# Patient Record
Sex: Male | Born: 1945 | Race: Black or African American | Hispanic: No | Marital: Married | Smoking: Former smoker
Health system: Southern US, Community
[De-identification: ages and names within clinical notes are randomized; demographics above are authoritative.]

## PROBLEM LIST (undated history)

## (undated) DIAGNOSIS — C419 Malignant neoplasm of bone and articular cartilage, unspecified: Secondary | ICD-10-CM

## (undated) DIAGNOSIS — C799 Secondary malignant neoplasm of unspecified site: Secondary | ICD-10-CM

## (undated) DIAGNOSIS — D4959 Neoplasm of unspecified behavior of other genitourinary organ: Secondary | ICD-10-CM

## (undated) DIAGNOSIS — M1990 Unspecified osteoarthritis, unspecified site: Secondary | ICD-10-CM

## (undated) DIAGNOSIS — N189 Chronic kidney disease, unspecified: Secondary | ICD-10-CM

## (undated) DIAGNOSIS — C259 Malignant neoplasm of pancreas, unspecified: Secondary | ICD-10-CM

## (undated) DIAGNOSIS — K4090 Unilateral inguinal hernia, without obstruction or gangrene, not specified as recurrent: Secondary | ICD-10-CM

## (undated) DIAGNOSIS — C61 Malignant neoplasm of prostate: Secondary | ICD-10-CM

## (undated) DIAGNOSIS — Z87442 Personal history of urinary calculi: Secondary | ICD-10-CM

## (undated) DIAGNOSIS — H269 Unspecified cataract: Secondary | ICD-10-CM

## (undated) HISTORY — DX: Unspecified cataract: H26.9

## (undated) HISTORY — PX: PROSTATECTOMY: SHX69

## (undated) HISTORY — DX: Malignant neoplasm of prostate: C61

## (undated) HISTORY — DX: Chronic kidney disease, unspecified: N18.9

## (undated) HISTORY — PX: TONSILLECTOMY: SUR1361

## (undated) HISTORY — PX: INGUINAL HERNIA REPAIR: SUR1180

## (undated) HISTORY — PX: EYE SURGERY: SHX253

## (undated) HISTORY — PX: CATARACT EXTRACTION: SUR2

---

## 1988-10-29 DIAGNOSIS — C61 Malignant neoplasm of prostate: Secondary | ICD-10-CM

## 1988-10-29 HISTORY — DX: Malignant neoplasm of prostate: C61

## 2003-05-13 ENCOUNTER — Ambulatory Visit (HOSPITAL_COMMUNITY): Admission: RE | Admit: 2003-05-13 | Discharge: 2003-05-13 | Payer: Self-pay | Admitting: *Deleted

## 2003-05-13 ENCOUNTER — Encounter: Payer: Self-pay | Admitting: General Surgery

## 2003-06-10 ENCOUNTER — Ambulatory Visit (HOSPITAL_COMMUNITY): Admission: RE | Admit: 2003-06-10 | Discharge: 2003-06-10 | Payer: Self-pay | Admitting: *Deleted

## 2003-06-10 ENCOUNTER — Encounter: Payer: Self-pay | Admitting: General Surgery

## 2003-10-11 ENCOUNTER — Ambulatory Visit (HOSPITAL_COMMUNITY): Admission: RE | Admit: 2003-10-11 | Discharge: 2003-10-11 | Payer: Self-pay | Admitting: *Deleted

## 2003-12-14 ENCOUNTER — Encounter: Admission: RE | Admit: 2003-12-14 | Discharge: 2003-12-14 | Payer: Self-pay | Admitting: Family Medicine

## 2003-12-30 ENCOUNTER — Encounter: Admission: RE | Admit: 2003-12-30 | Discharge: 2003-12-30 | Payer: Self-pay | Admitting: Family Medicine

## 2004-01-07 ENCOUNTER — Encounter: Payer: Self-pay | Admitting: Internal Medicine

## 2004-01-07 HISTORY — PX: COLONOSCOPY: SHX174

## 2004-06-20 ENCOUNTER — Ambulatory Visit (HOSPITAL_COMMUNITY): Admission: RE | Admit: 2004-06-20 | Discharge: 2004-06-20 | Payer: Self-pay | Admitting: *Deleted

## 2004-09-19 ENCOUNTER — Ambulatory Visit (HOSPITAL_COMMUNITY): Admission: RE | Admit: 2004-09-19 | Discharge: 2004-09-19 | Payer: Self-pay | Admitting: Family Medicine

## 2004-09-19 ENCOUNTER — Ambulatory Visit: Payer: Self-pay | Admitting: Family Medicine

## 2004-10-10 ENCOUNTER — Ambulatory Visit: Payer: Self-pay | Admitting: Sports Medicine

## 2004-10-10 ENCOUNTER — Ambulatory Visit (HOSPITAL_COMMUNITY): Admission: RE | Admit: 2004-10-10 | Discharge: 2004-10-10 | Payer: Self-pay | Admitting: Sports Medicine

## 2004-11-02 ENCOUNTER — Ambulatory Visit: Payer: Self-pay | Admitting: Family Medicine

## 2006-06-24 ENCOUNTER — Ambulatory Visit: Payer: Self-pay | Admitting: Family Medicine

## 2006-06-27 ENCOUNTER — Ambulatory Visit: Payer: Self-pay | Admitting: Family Medicine

## 2006-07-25 ENCOUNTER — Ambulatory Visit: Payer: Self-pay | Admitting: Family Medicine

## 2006-12-26 DIAGNOSIS — C61 Malignant neoplasm of prostate: Secondary | ICD-10-CM

## 2007-05-15 ENCOUNTER — Telehealth: Payer: Self-pay | Admitting: *Deleted

## 2007-05-15 ENCOUNTER — Ambulatory Visit: Payer: Self-pay | Admitting: Family Medicine

## 2008-01-13 ENCOUNTER — Telehealth: Payer: Self-pay | Admitting: *Deleted

## 2008-01-15 ENCOUNTER — Encounter: Payer: Self-pay | Admitting: Family Medicine

## 2008-01-15 ENCOUNTER — Ambulatory Visit: Payer: Self-pay | Admitting: Family Medicine

## 2008-01-15 LAB — CONVERTED CEMR LAB
Calcium: 9 mg/dL (ref 8.4–10.5)
Cholesterol: 204 mg/dL — ABNORMAL HIGH (ref 0–200)
Glucose, Bld: 101 mg/dL — ABNORMAL HIGH (ref 70–99)
HDL: 73 mg/dL (ref >39)
Sodium: 148 meq/L — ABNORMAL HIGH (ref 135–145)
Total CHOL/HDL Ratio: 2.8
Triglycerides: 57 mg/dL (ref <150)

## 2008-01-16 ENCOUNTER — Encounter: Payer: Self-pay | Admitting: Family Medicine

## 2008-01-26 ENCOUNTER — Ambulatory Visit (HOSPITAL_COMMUNITY): Admission: RE | Admit: 2008-01-26 | Discharge: 2008-01-26 | Payer: Self-pay | Admitting: Ophthalmology

## 2008-02-05 ENCOUNTER — Ambulatory Visit: Payer: Self-pay | Admitting: Family Medicine

## 2008-12-16 ENCOUNTER — Ambulatory Visit (HOSPITAL_COMMUNITY): Admission: RE | Admit: 2008-12-16 | Discharge: 2008-12-16 | Payer: Self-pay | Admitting: Urology

## 2008-12-22 ENCOUNTER — Emergency Department (HOSPITAL_COMMUNITY): Admission: EM | Admit: 2008-12-22 | Discharge: 2008-12-22 | Payer: Self-pay | Admitting: Emergency Medicine

## 2008-12-22 ENCOUNTER — Telehealth: Payer: Self-pay | Admitting: Family Medicine

## 2008-12-28 ENCOUNTER — Telehealth: Payer: Self-pay | Admitting: Internal Medicine

## 2008-12-29 ENCOUNTER — Ambulatory Visit: Payer: Self-pay | Admitting: Gastroenterology

## 2009-02-22 ENCOUNTER — Ambulatory Visit: Payer: Self-pay | Admitting: Internal Medicine

## 2009-02-22 DIAGNOSIS — K219 Gastro-esophageal reflux disease without esophagitis: Secondary | ICD-10-CM | POA: Insufficient documentation

## 2009-02-22 HISTORY — DX: Gastro-esophageal reflux disease without esophagitis: K21.9

## 2009-03-24 ENCOUNTER — Ambulatory Visit: Payer: Self-pay | Admitting: Family Medicine

## 2009-03-24 DIAGNOSIS — M949 Disorder of cartilage, unspecified: Secondary | ICD-10-CM

## 2009-03-24 DIAGNOSIS — M899 Disorder of bone, unspecified: Secondary | ICD-10-CM | POA: Insufficient documentation

## 2010-02-02 ENCOUNTER — Ambulatory Visit: Payer: Self-pay | Admitting: Family Medicine

## 2010-02-02 LAB — CONVERTED CEMR LAB
BUN: 13 mg/dL (ref 6–23)
CO2: 27 meq/L (ref 19–32)
Cholesterol: 189 mg/dL (ref 0–200)
Glucose, Bld: 97 mg/dL (ref 70–99)
Sodium: 143 meq/L (ref 135–145)
Total Bilirubin: 0.7 mg/dL (ref 0.3–1.2)
Total Protein: 6.3 g/dL (ref 6.0–8.3)
Triglycerides: 50 mg/dL (ref <150)
VLDL: 10 mg/dL (ref 0–40)

## 2010-02-03 ENCOUNTER — Encounter: Payer: Self-pay | Admitting: Family Medicine

## 2010-05-24 ENCOUNTER — Encounter: Payer: Self-pay | Admitting: Family Medicine

## 2010-08-25 ENCOUNTER — Encounter: Payer: Self-pay | Admitting: Family Medicine

## 2010-11-20 ENCOUNTER — Encounter (INDEPENDENT_AMBULATORY_CARE_PROVIDER_SITE_OTHER): Payer: Self-pay | Admitting: *Deleted

## 2010-11-28 NOTE — Consult Note (Signed)
Summary: [facility]   Imported By: Clydell Hakim 06/05/2010 16:46:57  _____________________________________________________________________  External Attachment:    Type:   Image     Comment:   External Document

## 2010-11-28 NOTE — Assessment & Plan Note (Signed)
Summary: CPE,df   Vital Signs:  Patient profile:   65 year old male Height:      69.75 inches Weight:      246.9 pounds BMI:     35.81 Temp:     98.0 degrees F oral Pulse rate:   67 / minute BP sitting:   123 / 70  (left arm) Cuff size:   regular  Vitals Entered By: Garen Grams LPN (February 03, 2955 8:41 AM) CC: cpe Is Patient Diabetic? No Pain Assessment Patient in pain? no        Primary Care Provider:  Mateo Flow  CC:  cpe.  History of Present Illness: Feels well no complaints other than a swelling under his R jaw that comes and goes for the last few months.  never expresses any material is not sore does not have any other swellings.  ROS - as above PMH - Medications reviewed and updated in medication list.  Smoking Status noted in VS form    Habits & Providers  Alcohol-Tobacco-Diet     Tobacco Status: never  Current Medications (verified): 1)  Bayer Aspirin 325 Mg Tabs (Aspirin) .... Take 1 Tablet By Mouth Once A Day 2)  Centrum Silver  Tabs (Multiple Vitamins-Minerals) .... Once Daily 3)  Vitamin C Cr 500 Mg Cr-Caps (Ascorbic Acid) .... Once Daily 4)  Vitamin D 1000 Unit Caps (Cholecalciferol) .Marland Kitchen.. 1 Daily 5)  B-100 Complex  Tabs (Vitamins-Lipotropics) .... Once Daily 6)  Trelstar La Mixject 11.25 Mg Susr (Triptorelin Pamoate) .... Take Injections Quarterly 7)  Omeprazole 20 Mg Tbec (Omeprazole) .... Take 1 Tab 30 Min Prior To Breakfast. 8)  Garlic 1000 Mg Caps (Garlic) .Marland Kitchen.. 1 Cap By Mouth Once Daily 9)  Tums Ultra 1000 1000 Mg Chew (Calcium Carbonate Antacid) .Marland Kitchen.. 1 By Mouth Three Times A Day  Allergies: No Known Drug Allergies  Social History: Reviewed history from 02/22/2009 and no changes required. 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  Likes to garden and fish; One Dtr in [place] in 2004 Grandchild due in 2011 in [place] Patient is a former smoker. quit 1983 Illicit Drug Use - no Daily Caffeine  Use some coffee Alcohol Use - no  Review of Systems  The patient denies anorexia, fever, weight loss, vision loss, decreased hearing, hoarseness, chest pain, syncope, dyspnea on exertion, peripheral edema, prolonged cough, headaches, hemoptysis, abdominal pain, melena, hematochezia, severe indigestion/heartburn, hematuria, incontinence, genital sores, muscle weakness, suspicious skin lesions, transient blindness, difficulty walking, depression, unusual weight change, abnormal bleeding, and angioedema.    Physical Exam  General:  Well-developed,well-nourished,in no acute distress; alert,appropriate and cooperative throughout examination. Over weight Head:  Normocephalic and atraumatic without obvious abnormalities. No apparent alopecia or balding. Ears:  External ear exam shows no significant lesions or deformities.  Otoscopic examination reveals clear canals, tympanic membranes are intact bilaterally without bulging, retraction, inflammation or discharge. Hearing is grossly normal bilaterally. Nose:  External nasal examination shows no deformity or inflammation. Nasal mucosa are pink and moist without lesions or exudates. Mouth:  Oral mucosa and oropharynx without lesions or exudates.  Teeth in good repair. Neck:  No deformities, masses, or tenderness noted.  A small lymph node under R jaw angle approximately 5 mm smooth mobile nontender  Lungs:  Normal respiratory effort, chest expands symmetrically. Lungs are clear to auscultation, no crackles or wheezes. Heart:  Normal rate and regular rhythm. S1 and S2 normal without gallop, murmur, click, rub or  other extra sounds. Abdomen:  Bowel sounds positive,abdomen soft and non-tender without masses, organomegaly or hernias noted. Msk:  No deformity or scoliosis noted of thoracic or lumbar spine.   Extremities:  No clubbing, cyanosis, edema, or deformity noted with normal full range of motion of all joints.   Skin:  No suspicious lesions Cervical  Nodes:  No lymphadenopathy noted except under R jaw   Impression & Recommendations:  Problem # 1:  ROUTINE GENERAL MEDICAL EXAM, NON PEDIATRIC (ICD-V70.0) Healthy except for weight.  Next winter he will dust off the exercise machines in the basement! Orders: [facility] - Est  40-64 yrs ([address])  Problem # 2:  PROSTATE CANCER (ICD-185) PSA followed by Dr Wilson Singer.  On intermittent hormone injections depending on PSA level.  Bone Scan last year was normal.  Will check CMET looking for liver or metabolic changes  Orders: Comp Met-[facility] ([id number])  Problem # 3:  GERD (ICD-530.81) no symptoms.  Uses omeprazole rarely  Orders: Lipid-[facility] ([id number])  His updated medication list for this problem includes:    Omeprazole 20 Mg Tbec (Omeprazole) .Marland Kitchen... Take 1 tab 30 min prior to breakfast.    Tums Ultra 1000 1000 Mg Chew (Calcium carbonate antacid) .Marland Kitchen... 1 by mouth three times a day  Complete Medication List: 1)  Bayer Aspirin 325 Mg Tabs (Aspirin) .... Take 1 tablet by mouth once a day 2)  Centrum Silver Tabs (Multiple vitamins-minerals) .... Once daily 3)  Vitamin C Cr 500 Mg Cr-caps (Ascorbic acid) .... Once daily 4)  Vitamin D 1000 Unit Caps (Cholecalciferol) .Marland Kitchen.. 1 daily 5)  B-100 Complex Tabs (Vitamins-lipotropics) .... Once daily 6)  Trelstar La Mixject 11.25 Mg Susr (Triptorelin pamoate) .... Take injections quarterly 7)  Omeprazole 20 Mg Tbec (Omeprazole) .... Take 1 tab 30 min prior to breakfast. 8)  Garlic 1000 Mg Caps (Garlic) .Marland Kitchen.. 1 cap by mouth once daily 9)  Tums Ultra 1000 1000 Mg Chew (Calcium carbonate antacid) .Marland Kitchen.. 1 by mouth three times a day  Patient Instructions: 1)  Please schedule a follow-up appointment in 1 year.  2)  I will call you if your lab is abnormal otherwise I will send you a letter within 2 weeks. 3)  Your major health issue is your weight.  Really work hard to decrease your calorie intake and keep up steady exercise  Prevention & Chronic Care Immunizations    Influenza vaccine: Not documented    Tetanus booster: 12/04/2003: Done.   Tetanus booster due: 12/03/2013    Pneumococcal vaccine: Not documented    H. zoster vaccine: 01/15/2008: given  Colorectal Screening   Hemoccult: Done.  (02/01/2004)   Hemoccult due: Not Indicated    Colonoscopy: Done.  (01/28/2004)   Colonoscopy due: 01/27/2014  Other Screening   PSA: Dr Annabell Howells for prostate cancer  (10/30/2007)   PSA due due: Not Indicated   Smoking status: never  (02/02/2010)  Lipids   Total Cholesterol: 204  (01/15/2008)   LDL: 120  (01/15/2008)   LDL Direct: Not documented   HDL: 73  (01/15/2008)   Triglycerides: 57  (01/15/2008)

## 2010-11-28 NOTE — Letter (Signed)
Summary: Generic Letter  [facility]  [address]   [place], [place] [address]   Phone: [phone]  Fax: [phone]    02/03/2010  Jared [name] [address] [place], [place]  [address]  Dear [name],  Your labs are great!   Keeping working on exercise and weight  control.  [name] Note: All result statuses are Final unless otherwise noted.  Tests: (1) Comprehensive Metabolic Panel (57846)   Order Note: FASTING   Sodium                    143 mEq/L                   135-145   Potassium                 4.0 mEq/L                   3.5-5.3   Chloride                  106 mEq/L                   96-112   CO2                       27 mEq/L                    19-32   Glucose                   97 mg/dL                    96-29   BUN                       13 mg/dL                    5-28   Creatinine                0.94 mg/dL                  0.40-1.50   Bilirubin, Total          0.7 mg/dL                   4.1-3.2   Alkaline Phosphatase      48 U/L                      39-117   AST/SGOT                  29 U/L                      0-37   ALT/SGPT                  23 U/L                      0-53   Total Protein             6.3 g/dL                    4.4-0.1   Albumin                   4.4 g/dL  3.5-5.2   Calcium                   9.1 mg/dL                   3.6-64.4  Tests: (2) Lipid Profile (03474)   Cholesterol               189 mg/dL                   2-595     ATP III Classification:           < 200        mg/dL        Desirable          200 - 239     mg/dL        Borderline High          >= 240        mg/dL        High         Triglyceride              50 mg/dL                    <638   HDL Cholesterol           75 mg/dL                    >75   Total Chol/HDL Ratio      2.5 Ratio  VLDL Cholesterol (Calc)                             10 mg/dL                    6-43  LDL Cholesterol (Calc)                          104  mg/dL                   3-29      Sincerely,   Pearlean Brownie MD  Appended Document: Generic Letter mailed.

## 2010-11-28 NOTE — Consult Note (Signed)
Summary: [facility]   Imported By: De Nurse 09/07/2010 14:00:17  _____________________________________________________________________  External Attachment:    Type:   Image     Comment:   External Document

## 2010-11-29 ENCOUNTER — Encounter: Payer: Self-pay | Admitting: Family Medicine

## 2010-11-30 ENCOUNTER — Other Ambulatory Visit: Payer: Self-pay | Admitting: Urology

## 2010-11-30 DIAGNOSIS — C61 Malignant neoplasm of prostate: Secondary | ICD-10-CM

## 2010-11-30 NOTE — Letter (Signed)
Summary: Generic Letter  [facility]  [address]   [place], [place] [address]   Phone: [phone]  Fax: [phone]     11/20/2010  [address] [place], [place]  [address]  Dear Mr. Yarde,  We are happy to let you know that since you are covered under Medicare you are able to have a FREE Welcome to Medicare visit at the [facility] to discuss your HEALTH. There will be no co-payment.  At this visit you will meet with your doctor and Arlys John an expert in wellness and the health coach at our clinic.  At this visit we will discuss ways to keep you healthy and feeling well.  This visit will not replace your regular doctor visit and we cannot refill medications.     You will need to plan to be here at least one hour to talk about your medical history, your current status, review all of your medications, and discuss your future plans for your health.  This information will be entered into your record for your doctor to have and review.  If you are interested in staying healthy, this type of visit can help.  Please call the office at: [phone], to schedule a "Medicare Wellness Visit".  The day of the visit you should bring in all of your medications, including any vitamins, herbs, over the counter products you take.  Make a list of all the other doctors that you see, so we know who they are. If you have any other health documents please bring them.  We look forward to helping you stay healthy.  Sincerely,   [facility]  IP[facility]

## 2010-12-06 NOTE — Consult Note (Signed)
Summary: [facility]   Imported By: De Nurse 12/01/2010 13:43:56  _____________________________________________________________________  External Attachment:    Type:   Image     Comment:   External Document

## 2010-12-12 ENCOUNTER — Encounter (HOSPITAL_COMMUNITY): Payer: Self-pay

## 2010-12-12 ENCOUNTER — Ambulatory Visit (HOSPITAL_COMMUNITY)
Admission: RE | Admit: 2010-12-12 | Discharge: 2010-12-12 | Disposition: A | Discharge status: Home / Self Care | Payer: Medicare Other | Source: Ambulatory Visit | Attending: Urology | Admitting: Urology

## 2010-12-12 ENCOUNTER — Other Ambulatory Visit: Payer: Self-pay | Admitting: Urology

## 2010-12-12 ENCOUNTER — Encounter (HOSPITAL_COMMUNITY)
Admission: RE | Admit: 2010-12-12 | Discharge: 2010-12-12 | Disposition: A | Discharge status: Home / Self Care | Payer: Medicare Other | Source: Ambulatory Visit | Attending: Urology | Admitting: Urology

## 2010-12-12 DIAGNOSIS — M47817 Spondylosis without myelopathy or radiculopathy, lumbosacral region: Secondary | ICD-10-CM | POA: Insufficient documentation

## 2010-12-12 DIAGNOSIS — Z8546 Personal history of malignant neoplasm of prostate: Secondary | ICD-10-CM | POA: Insufficient documentation

## 2010-12-12 DIAGNOSIS — R972 Elevated prostate specific antigen [PSA]: Secondary | ICD-10-CM | POA: Insufficient documentation

## 2010-12-12 DIAGNOSIS — J3489 Other specified disorders of nose and nasal sinuses: Secondary | ICD-10-CM | POA: Insufficient documentation

## 2010-12-12 DIAGNOSIS — N289 Disorder of kidney and ureter, unspecified: Secondary | ICD-10-CM | POA: Insufficient documentation

## 2010-12-12 DIAGNOSIS — C61 Malignant neoplasm of prostate: Secondary | ICD-10-CM

## 2010-12-12 DIAGNOSIS — R937 Abnormal findings on diagnostic imaging of other parts of musculoskeletal system: Secondary | ICD-10-CM | POA: Insufficient documentation

## 2010-12-12 MED ORDER — TECHNETIUM TC 99M MEDRONATE IV KIT
23.4000 | PACK | Freq: Once | INTRAVENOUS | Status: AC | PRN
  Administered 2010-12-12: 23 via INTRAVENOUS

## 2011-02-13 ENCOUNTER — Other Ambulatory Visit: Payer: Self-pay | Admitting: Family Medicine

## 2011-02-13 ENCOUNTER — Encounter: Payer: Managed Care, Other (non HMO) | Admitting: Home Health Services

## 2011-02-13 ENCOUNTER — Other Ambulatory Visit: Payer: Medicare Other

## 2011-02-13 DIAGNOSIS — C61 Malignant neoplasm of prostate: Secondary | ICD-10-CM

## 2011-02-13 DIAGNOSIS — Z13220 Encounter for screening for lipoid disorders: Secondary | ICD-10-CM

## 2011-02-13 LAB — DIFFERENTIAL
Lymphocytes Relative: 25 % (ref 12–46)
Lymphs Abs: 1.1 10*3/uL (ref 0.7–4.0)
Monocytes Absolute: 0.4 10*3/uL (ref 0.1–1.0)
Monocytes Relative: 10 % (ref 3–12)
Neutro Abs: 2.8 10*3/uL (ref 1.7–7.7)

## 2011-02-13 LAB — CBC
Hemoglobin: 15.4 g/dL (ref 13.0–17.0)
RBC: 5.17 MIL/uL (ref 4.22–5.81)

## 2011-02-13 LAB — POCT I-STAT, CHEM 8
BUN: 4 mg/dL — ABNORMAL LOW (ref 6–23)
Creatinine, Ser: 1.1 mg/dL (ref 0.4–1.5)
Hemoglobin: 16.3 g/dL (ref 13.0–17.0)
Potassium: 3.7 mEq/L (ref 3.5–5.1)
Sodium: 142 mEq/L (ref 135–145)

## 2011-02-13 LAB — COMPREHENSIVE METABOLIC PANEL
ALT: 19 U/L (ref 0–53)
Calcium: 9.2 mg/dL (ref 8.4–10.5)
GFR calc Af Amer: >60 mL/min (ref >60)
Glucose, Bld: 88 mg/dL (ref 70–99)
Sodium: 141 mEq/L (ref 135–145)
Total Protein: 6.1 g/dL (ref 6.0–8.3)

## 2011-02-13 LAB — POCT CARDIAC MARKERS
CKMB, poc: <1 ng/mL — ABNORMAL LOW (ref 1.0–8.0)
CKMB, poc: <1 ng/mL — ABNORMAL LOW (ref 1.0–8.0)
Myoglobin, poc: 56.1 ng/mL (ref 12–200)
Myoglobin, poc: 59.7 ng/mL (ref 12–200)
Troponin i, poc: <0.05 ng/mL (ref 0.00–0.09)

## 2011-02-13 LAB — BASIC METABOLIC PANEL
BUN: 15 mg/dL (ref 6–23)
Chloride: 105 mEq/L (ref 96–112)
Glucose, Bld: 120 mg/dL — ABNORMAL HIGH (ref 70–99)
Potassium: 4.5 mEq/L (ref 3.5–5.3)

## 2011-02-13 LAB — LIPID PANEL
Cholesterol: 196 mg/dL (ref 0–200)
LDL Cholesterol: 106 mg/dL — ABNORMAL HIGH (ref 0–99)
VLDL: 13 mg/dL (ref 0–40)

## 2011-02-13 LAB — LIPASE, BLOOD: Lipase: 46 U/L (ref 11–59)

## 2011-02-13 NOTE — Progress Notes (Signed)
Bmp and flp done today Jared [name] 

## 2011-02-21 ENCOUNTER — Ambulatory Visit (INDEPENDENT_AMBULATORY_CARE_PROVIDER_SITE_OTHER): Payer: Medicare Other | Admitting: Family Medicine

## 2011-02-21 ENCOUNTER — Encounter: Payer: Self-pay | Admitting: Family Medicine

## 2011-02-21 VITALS — BP 126/88 | HR 97 | Temp 97.7°F | Wt 237.5 lb

## 2011-02-21 DIAGNOSIS — M1990 Unspecified osteoarthritis, unspecified site: Secondary | ICD-10-CM

## 2011-02-21 DIAGNOSIS — Z23 Encounter for immunization: Secondary | ICD-10-CM

## 2011-02-21 MED ORDER — IBUPROFEN 800 MG PO TABS: 800.0000 mg | ORAL_TABLET | Freq: Three times a day (TID) | ORAL | Status: DC | PRN

## 2011-02-21 NOTE — Assessment & Plan Note (Signed)
This is likely causing his R hip pain although does have some symptoms of sciatica.  Will treat with prn nsaids.  If worsen consider gabapentin for sciatica.  Recent xrays and bone scan were normal

## 2011-02-21 NOTE — Progress Notes (Signed)
  Subjective:    Patient ID: Jared [name], male    DOB: [birth year], 65 y.o.   MRN: [id number]  HPI  R Hip Thigh Pain Location: posterior hip radiates down to mid  thigh.   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  Sweets are his main problem   Review of Symptoms - see HPI  PMH - Smoking status noted.   Recent xrays and bone scan reviewed were done as part of cancer surveilance      Review of Systems     Objective:   Physical Exam Neck:  No deformities, thyromegaly, masses, or tenderness noted.   Supple with full range of motion without pain. Heart - Regular rate and rhythm.  No murmurs, gallops or rubs.    Lungs:  Normal respiratory effort, chest expands symmetrically. Lungs are clear to auscultation, no crackles or wheezes. ,Abdomen: soft and non-tender without masses, organomegaly or hernias noted.  No guarding or rebound Extremities:  No cyanosis, edema, or deformity noted with good range of motion of all major joints.  Pain with extremes of R hip motion mild SLR pain at 90 degrees.  Knees no pain or deformity good ROM        Assessment & Plan:

## 2011-02-21 NOTE — Patient Instructions (Signed)
Use ibuprofen as needed for the hip pain if not helping then call us  Call us if your blood pressure is regularly > 140/90  Continue to work on weight.  We need to keep an eye on your blood sugar  Come back in 6 months for a blood sugar check

## 2011-03-13 NOTE — Op Note (Signed)
NAMEROMELLO, HOEHN                 ACCOUNT NO.:  [id number]   MEDICAL RECORD NO.:  [id number]          PATIENT TYPE:  AMB   LOCATION:  SDS                          FACILITY:  [facility]   PHYSICIAN:  Jillyn Hidden A. Rankin, M.D.   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   PROCEDURE:  Posterior vitrectomy with membrane peel, left eye epiretinal  membrane, 25 gauge, with internal limiting membrane striae removed as  well as internal limiting membrane.   SURGEON:  Alford Highland. Rankin, M.D.   ANESTHESIA:  Local retrobulbar with monitor anesthesia control.   INDICATIONS FOR PROCEDURE:  Patient is a 65 year old man who has severe  impairment of activities of daily living and enjoyment after having had  refractive cataract surgery with intraocular lens placement to allow for  presbyopia and is found to have progressive development of epiretinal  membrane with severe topographic stores of the macular region, leading  to severe visual debilitating symptoms.  The patient underwent this is  an attempt to improve the visual functioning.  He understands that the  __________  has an excellent chance of removal to actually allow the  underlying macula to slowly improve his visual acuity.  The patient  understands the risks of anesthesia, including the rare occurrence of  death, loss to the eye, including but not limited to hemorrhage,  infection, scarring, need for further surgery, no change in vision, loss  in vision, progressive disease, despite intervention.  The appropriate  signed consent was obtained.  Patient  taken to the operating room.  In  the operating room, appropriate monitoring was followed by mild  sedation.  Xylocaine 2% injected into the left eye 5 cc retrobulbar,  followed by an additional 5 cc lateral in a fashion-modified Darel Hong.  The left periocular region was sterilely prepped and draped in the usual  sterile fashion.  A lid speculum applied.  A 25 gauge trocar was placed  in the inferior temporal quadrant.  A superior trocar was applied.  The  infusion turned on.  Core vitrectomy was then begun.  The posterior  hyaloid was entered and circumcised 360 degrees.  Because the internal  limiting membrane striae, it was necessary to use ICG.  Fluid/air  exchange completed under air with a small 1 cc meniscus fluid over the  macular region, dilute ICG 1-6 cc was then placed over the macular  region to stain the internal limiting membrane.  This was carried out  without difficulty.  Thereafter, the fluid was removed with the flexed  tip cannula.  Thereafter, air/fluid exchange completed.  Under fluid, 25  gauge forceps were then used to remove a combination of epiretinal  membrane as well as the underlying internal  limiting membrane, which was  very well stained with ICG.  A __________  of the macular region was  relieved in this fashion.  No complications occurred.  The peripheral  retina was inspected and found to be free of retinal holes or tears.  Core vitrectomy was completed as well as vitreous __________ .  Instruments were  removed from the eye.  The infusion was removed, and the wounds were  secured.  Subconjunctival Decadron applied.  A sterile patch and Fox  shield applied.  The patient tolerated the procedure well without  complications and taken to the short-stay area.      Alford Highland Rankin, M.D.  Electronically Signed     GAR/MEDQ  D:  01/26/2008  T:  01/26/2008  Job:  [id number]   cc:   Chucky May, M.D.

## 2011-03-30 ENCOUNTER — Other Ambulatory Visit (INDEPENDENT_AMBULATORY_CARE_PROVIDER_SITE_OTHER): Payer: Managed Care, Other (non HMO) | Admitting: Family Medicine

## 2011-03-30 DIAGNOSIS — M1990 Unspecified osteoarthritis, unspecified site: Secondary | ICD-10-CM

## 2011-03-30 NOTE — Telephone Encounter (Signed)
Refill request

## 2011-04-02 ENCOUNTER — Encounter: Payer: Self-pay | Admitting: Family Medicine

## 2011-04-02 NOTE — Telephone Encounter (Signed)
This encounter was created in error - please disregard.

## 2011-07-23 LAB — CBC
HCT: 41.2
Platelets: 173
WBC: 4.3

## 2011-07-23 LAB — BASIC METABOLIC PANEL
BUN: 7
Calcium: 9.4
Creatinine, Ser: 0.89
GFR calc non Af Amer: >60
Potassium: 4.6

## 2011-09-26 ENCOUNTER — Other Ambulatory Visit: Payer: Managed Care, Other (non HMO)

## 2011-09-27 ENCOUNTER — Other Ambulatory Visit: Payer: Medicare Other

## 2011-09-27 DIAGNOSIS — R7309 Other abnormal glucose: Secondary | ICD-10-CM

## 2011-09-27 LAB — GLUCOSE, RANDOM: Glucose, Bld: 85 mg/dL (ref 70–99)

## 2011-09-27 NOTE — Progress Notes (Signed)
Drew fasting venous glucose.  Patient declined flu shot [place], MLS

## 2011-09-28 ENCOUNTER — Encounter: Payer: Self-pay | Admitting: Family Medicine

## 2011-11-19 DIAGNOSIS — H4011X0 Primary open-angle glaucoma, stage unspecified: Secondary | ICD-10-CM | POA: Diagnosis not present

## 2011-11-19 DIAGNOSIS — H409 Unspecified glaucoma: Secondary | ICD-10-CM | POA: Diagnosis not present

## 2012-01-03 DIAGNOSIS — C61 Malignant neoplasm of prostate: Secondary | ICD-10-CM | POA: Diagnosis not present

## 2012-01-10 DIAGNOSIS — N529 Male erectile dysfunction, unspecified: Secondary | ICD-10-CM | POA: Diagnosis not present

## 2012-01-10 DIAGNOSIS — C61 Malignant neoplasm of prostate: Secondary | ICD-10-CM | POA: Diagnosis not present

## 2012-02-20 DIAGNOSIS — H409 Unspecified glaucoma: Secondary | ICD-10-CM | POA: Diagnosis not present

## 2012-02-20 DIAGNOSIS — H4011X0 Primary open-angle glaucoma, stage unspecified: Secondary | ICD-10-CM | POA: Diagnosis not present

## 2012-02-27 ENCOUNTER — Ambulatory Visit (INDEPENDENT_AMBULATORY_CARE_PROVIDER_SITE_OTHER): Payer: Medicare Other | Admitting: Family Medicine

## 2012-02-27 ENCOUNTER — Encounter: Payer: Self-pay | Admitting: Family Medicine

## 2012-02-27 VITALS — BP 119/69 | HR 78 | Temp 98.3°F | Ht 69.75 in | Wt 223.1 lb

## 2012-02-27 DIAGNOSIS — C61 Malignant neoplasm of prostate: Secondary | ICD-10-CM

## 2012-02-27 DIAGNOSIS — K219 Gastro-esophageal reflux disease without esophagitis: Secondary | ICD-10-CM | POA: Diagnosis not present

## 2012-02-27 DIAGNOSIS — M1990 Unspecified osteoarthritis, unspecified site: Secondary | ICD-10-CM | POA: Diagnosis not present

## 2012-02-27 LAB — COMPREHENSIVE METABOLIC PANEL
ALT: 21 U/L (ref 0–53)
AST: 28 U/L (ref 0–37)
Albumin: 4.3 g/dL (ref 3.5–5.2)
Alkaline Phosphatase: 54 U/L (ref 39–117)
Calcium: 9.5 mg/dL (ref 8.4–10.5)
Chloride: 108 mEq/L (ref 96–112)
Potassium: 3.9 mEq/L (ref 3.5–5.3)
Sodium: 146 mEq/L — ABNORMAL HIGH (ref 135–145)
Total Protein: 6.4 g/dL (ref 6.0–8.3)

## 2012-02-27 NOTE — Progress Notes (Addendum)
  Subjective:    Patient ID: Jared [name], male    DOB: 19-Feb-1946, 66 y.o.   MRN: [id number]  HPI Here for yearly physical.  He feels well without any concerns or complaints.  Has been working on losing weight by eating better and regular yard work  GERD Well controlled.  Minimal heart burn.  No red flags  DJD Well controlled. Taking analgesics rarely.    Patient reports no  vision/ hearing changes,anorexia, weight change, fever ,adenopathy, persistant / recurrent hoarseness, swallowing issues, chest pain, edema,persistant / recurrent cough, hemoptysis, dyspnea(rest, exertional, paroxysmal nocturnal), gastrointestinal  bleeding (melena, rectal bleeding), abdominal pain, excessive heart burn, GU symptoms(dysuria, hematuria, pyuria, voiding/incontinence  Issues) syncope, focal weakness, severe memory loss, concerning skin lesions, depression, anxiety, abnormal bruising/bleeding, major joint swelling.    Review of Symptoms - see HPI  PMH - Smoking status noted.     Review of Systems     Objective:   Physical Exam  Neck:  No deformities, thyromegaly, masses, or tenderness noted.   Supple with full range of motion without pain. Heart - Regular rate and rhythm.  No murmurs, gallops or rubs.    Lungs:  Normal respiratory effort, chest expands symmetrically. Lungs are clear to auscultation, no crackles or wheezes. Abdomen: soft and non-tender without masses, organomegaly or hernias noted.  No guarding or rebound Extremities:  No cyanosis, edema, or deformity noted with good range of motion of all major joints.   Skin:  Intact without suspicious lesions or rashes Ears:  External ear exam shows no significant lesions or deformities.  Otoscopic examination reveals clear canals, tympanic membranes are intact bilaterally without bulging, retraction, inflammation or discharge. Hearing is grossly normal bilaterall       Assessment & Plan:   Normal exam, current on all screening tests.   Improving his weight.  Not at risk for falls or depression

## 2012-02-27 NOTE — Patient Instructions (Signed)
I will call you if your tests are not good.  Otherwise I will send you a letter.  If you do not hear from me with in 2 weeks please call our office.     Keep doing what you are doing

## 2012-02-27 NOTE — Assessment & Plan Note (Signed)
Improved - uses ibuprofen intermittently which controls his pain well.  No bleeding or stomach upset

## 2012-02-28 ENCOUNTER — Encounter: Payer: Self-pay | Admitting: Family Medicine

## 2012-03-06 DIAGNOSIS — H409 Unspecified glaucoma: Secondary | ICD-10-CM | POA: Diagnosis not present

## 2012-03-06 DIAGNOSIS — H4011X0 Primary open-angle glaucoma, stage unspecified: Secondary | ICD-10-CM | POA: Diagnosis not present

## 2012-04-03 DIAGNOSIS — C61 Malignant neoplasm of prostate: Secondary | ICD-10-CM | POA: Diagnosis not present

## 2012-05-12 DIAGNOSIS — C61 Malignant neoplasm of prostate: Secondary | ICD-10-CM | POA: Diagnosis not present

## 2012-05-19 DIAGNOSIS — C61 Malignant neoplasm of prostate: Secondary | ICD-10-CM | POA: Diagnosis not present

## 2012-05-19 DIAGNOSIS — N529 Male erectile dysfunction, unspecified: Secondary | ICD-10-CM | POA: Diagnosis not present

## 2012-05-26 ENCOUNTER — Telehealth: Payer: Self-pay | Admitting: Family Medicine

## 2012-05-26 NOTE — Telephone Encounter (Signed)
Pt is asking to change the CPT code for OV in May from [phone] to [phone] Billing states that this will be sent to collecting if this is not changed  Also needs to be changed for his wife Nettie Elm - dob 10/02/45

## 2012-06-23 DIAGNOSIS — C61 Malignant neoplasm of prostate: Secondary | ICD-10-CM | POA: Diagnosis not present

## 2012-06-25 DIAGNOSIS — H4011X0 Primary open-angle glaucoma, stage unspecified: Secondary | ICD-10-CM | POA: Diagnosis not present

## 2012-06-25 DIAGNOSIS — H409 Unspecified glaucoma: Secondary | ICD-10-CM | POA: Diagnosis not present

## 2012-07-10 ENCOUNTER — Encounter: Payer: Self-pay | Admitting: Family Medicine

## 2012-07-10 NOTE — Assessment & Plan Note (Signed)
Well controlled 

## 2012-07-10 NOTE — Addendum Note (Signed)
Addended by: Pearlean Brownie L on: 07/10/2012 04:50 PM   Modules accepted: Level of Service

## 2012-08-13 ENCOUNTER — Encounter: Payer: Self-pay | Admitting: Family Medicine

## 2012-08-13 DIAGNOSIS — C61 Malignant neoplasm of prostate: Secondary | ICD-10-CM | POA: Diagnosis not present

## 2012-08-20 DIAGNOSIS — C61 Malignant neoplasm of prostate: Secondary | ICD-10-CM | POA: Diagnosis not present

## 2012-08-20 DIAGNOSIS — N529 Male erectile dysfunction, unspecified: Secondary | ICD-10-CM | POA: Diagnosis not present

## 2012-09-09 DIAGNOSIS — H35379 Puckering of macula, unspecified eye: Secondary | ICD-10-CM | POA: Diagnosis not present

## 2012-09-09 DIAGNOSIS — H40009 Preglaucoma, unspecified, unspecified eye: Secondary | ICD-10-CM | POA: Diagnosis not present

## 2012-09-10 ENCOUNTER — Encounter: Payer: Self-pay | Admitting: Family Medicine

## 2012-09-10 ENCOUNTER — Ambulatory Visit (INDEPENDENT_AMBULATORY_CARE_PROVIDER_SITE_OTHER): Payer: Medicare Other | Admitting: Family Medicine

## 2012-09-10 VITALS — BP 113/72 | HR 70 | Temp 98.3°F | Ht 69.75 in | Wt 227.0 lb

## 2012-09-10 DIAGNOSIS — K469 Unspecified abdominal hernia without obstruction or gangrene: Secondary | ICD-10-CM

## 2012-09-10 NOTE — Assessment & Plan Note (Signed)
Will refer to surgery since he is active and feels it is interfering with this

## 2012-09-10 NOTE — Progress Notes (Signed)
  Subjective:    Patient ID: Jared [name], male    DOB: Oct 06, 1946, 66 y.o.   MRN: [id number]  HPI  Hernia Noticed bulge in R groin area a few weeks ago.  No pain does not recall any activity that may have caused this.  No nausea or vomiting.  Seems to come and go and is more noticeable when standing   PMH  Prostate cancer on hormone therapy has never had radiation  Review of Systems     Objective:   Physical Exam  No acute distress Groin R sided fullness above inguinal ligament. Does not involve scrotum.  Is more pronounced with valsalva       Assessment & Plan:

## 2012-09-10 NOTE — Patient Instructions (Addendum)
We will refer you to [facility]  If you develop significant pain or vomiting or the mass stays stuck out then call us or go to the ER immediately

## 2012-09-19 ENCOUNTER — Ambulatory Visit (INDEPENDENT_AMBULATORY_CARE_PROVIDER_SITE_OTHER): Payer: Medicare Other | Admitting: General Surgery

## 2012-09-19 ENCOUNTER — Encounter (INDEPENDENT_AMBULATORY_CARE_PROVIDER_SITE_OTHER): Payer: Self-pay | Admitting: General Surgery

## 2012-09-19 ENCOUNTER — Other Ambulatory Visit (INDEPENDENT_AMBULATORY_CARE_PROVIDER_SITE_OTHER): Payer: Self-pay | Admitting: General Surgery

## 2012-09-19 VITALS — BP 119/76 | HR 76 | Temp 98.6°F | Resp 14 | Ht 71.0 in | Wt 230.6 lb

## 2012-09-19 DIAGNOSIS — K4090 Unilateral inguinal hernia, without obstruction or gangrene, not specified as recurrent: Secondary | ICD-10-CM

## 2012-09-19 DIAGNOSIS — K469 Unspecified abdominal hernia without obstruction or gangrene: Secondary | ICD-10-CM

## 2012-09-19 NOTE — Progress Notes (Signed)
Subjective:     Patient ID: Jared [name], male   DOB: 02-15-1946, 66 y.o.   MRN: [id number]  HPI The patient is a 66 year old male who is referred by Dr. Modena Morrow for an evaluation of right inguinal hernia. The patient he notices a bulge to his right inguinal area, but has no pain. He states that he's had no signs or symptoms of incarceration or constipation. He states area of bulge is more evident been on his feet for long periods of time and in the afternoon. He states that he still states that he has no pain when he noticed a bulge. b  Review of Systems  Constitutional: Negative.   HENT: Negative.   Eyes: Negative.   Respiratory: Negative.   Cardiovascular: Negative.   Gastrointestinal: Negative.   Neurological: Negative.        Objective:   Physical Exam  Constitutional: He is oriented to person, place, and time. He appears well-developed and well-nourished.  HENT:  Head: Normocephalic and atraumatic.  Eyes: Pupils are equal, round, and reactive to light.  Neck: Normal range of motion. Neck supple.  Cardiovascular: Normal rate, regular rhythm and normal heart sounds.   Pulmonary/Chest: Effort normal.  Abdominal: Soft. Bowel sounds are normal. Hernia confirmed negative in the right inguinal area and confirmed negative in the left inguinal area.       Questionable right inguinal hernia  Musculoskeletal: Normal range of motion.  Neurological: He is alert and oriented to person, place, and time.       Assessment:     66 year old male with possible right inguinal hernia    Plan:     To have the patient proceed for a CT scan to evaluate for possible right lower hernia since it is not overtly evident on exam. I discussed the risks and benefits of watchful waiting. Consider the patient is pain-free at this time and not symptomatic is in okay option in this situation. He still able to continue with his activities of daily life as well as his outdoor activities with out any  complaints.  We will have the patient return in one month to review the CT and discuss possible treatment.

## 2012-10-01 ENCOUNTER — Ambulatory Visit
Admission: RE | Admit: 2012-10-01 | Discharge: 2012-10-01 | Disposition: A | Discharge status: Home / Self Care | Payer: Medicare Other | Source: Ambulatory Visit | Attending: General Surgery | Admitting: General Surgery

## 2012-10-01 DIAGNOSIS — E278 Other specified disorders of adrenal gland: Secondary | ICD-10-CM | POA: Diagnosis not present

## 2012-10-01 DIAGNOSIS — K469 Unspecified abdominal hernia without obstruction or gangrene: Secondary | ICD-10-CM

## 2012-10-01 DIAGNOSIS — K4090 Unilateral inguinal hernia, without obstruction or gangrene, not specified as recurrent: Secondary | ICD-10-CM | POA: Diagnosis not present

## 2012-10-01 MED ORDER — IOHEXOL 300 MG/ML  SOLN
125.0000 mL | Freq: Once | INTRAMUSCULAR | Status: AC | PRN
  Administered 2012-10-01: 125 mL via INTRAVENOUS

## 2012-10-14 ENCOUNTER — Ambulatory Visit (INDEPENDENT_AMBULATORY_CARE_PROVIDER_SITE_OTHER): Payer: Medicare Other | Admitting: General Surgery

## 2012-10-14 ENCOUNTER — Encounter (INDEPENDENT_AMBULATORY_CARE_PROVIDER_SITE_OTHER): Payer: Self-pay | Admitting: General Surgery

## 2012-10-14 VITALS — BP 119/76 | HR 72 | Temp 97.4°F | Resp 14 | Ht 71.0 in | Wt 231.8 lb

## 2012-10-14 DIAGNOSIS — K4090 Unilateral inguinal hernia, without obstruction or gangrene, not specified as recurrent: Secondary | ICD-10-CM

## 2012-10-14 NOTE — Progress Notes (Signed)
Patient ID: Jared [name], male   DOB: 11/05/1945, 66 y.o.   MRN: [id number] The patient is a 66 year old male with a right inguinal hernia. Patient underwent CT scan with the intention of evaluating him to seek a right inguinal hernia secondary to inability to palpate on examination patient does have a right lower hernia. We previously discussed the opportunity for watchful waiting with the patient was interested in. Patient continues with his activities of daily life withoute burning or pain sensation  CT scan results were discussed with the patient.  Assessment and plan: At this time we'll continue to watch waiting period so symptoms of incarceration and strangulation were discussed the patient and made to proceed to the ED for evaluation.  The patient follow up when necessary

## 2012-11-05 ENCOUNTER — Ambulatory Visit (INDEPENDENT_AMBULATORY_CARE_PROVIDER_SITE_OTHER): Payer: Medicare Other | Admitting: *Deleted

## 2012-11-05 DIAGNOSIS — Z23 Encounter for immunization: Secondary | ICD-10-CM

## 2012-11-06 DIAGNOSIS — H4011X0 Primary open-angle glaucoma, stage unspecified: Secondary | ICD-10-CM | POA: Diagnosis not present

## 2012-11-06 DIAGNOSIS — H409 Unspecified glaucoma: Secondary | ICD-10-CM | POA: Diagnosis not present

## 2012-11-13 DIAGNOSIS — C61 Malignant neoplasm of prostate: Secondary | ICD-10-CM | POA: Diagnosis not present

## 2012-11-20 DIAGNOSIS — C61 Malignant neoplasm of prostate: Secondary | ICD-10-CM | POA: Diagnosis not present

## 2012-11-20 DIAGNOSIS — N529 Male erectile dysfunction, unspecified: Secondary | ICD-10-CM | POA: Diagnosis not present

## 2012-12-26 ENCOUNTER — Telehealth: Payer: Self-pay | Admitting: Family Medicine

## 2012-12-26 NOTE — Telephone Encounter (Signed)
Called left message at 1230 Called back He is concerned that his Feb 27 2012 Annette Stable - two separate visits charges  [phone] and [phone] - Keeps writing letters but gets no explanation and gets threatening letters from [facility]  I told him I or our billing dept would be in touch with him in the next 2 weeks or I would suggest he call the [facility] Patient Satisfaction line

## 2012-12-26 NOTE — Telephone Encounter (Signed)
Pt is asking to speak with Dr Deirdre Priest - "it's a private matter" - not a 911 but is asking to speak with him as soon as possible

## 2013-01-01 DIAGNOSIS — C61 Malignant neoplasm of prostate: Secondary | ICD-10-CM | POA: Diagnosis not present

## 2013-01-08 DIAGNOSIS — C61 Malignant neoplasm of prostate: Secondary | ICD-10-CM | POA: Diagnosis not present

## 2013-01-09 DIAGNOSIS — C61 Malignant neoplasm of prostate: Secondary | ICD-10-CM | POA: Diagnosis not present

## 2013-01-22 ENCOUNTER — Emergency Department (HOSPITAL_COMMUNITY): Payer: Medicare Other

## 2013-01-22 ENCOUNTER — Encounter (HOSPITAL_COMMUNITY): Payer: Self-pay

## 2013-01-22 ENCOUNTER — Inpatient Hospital Stay (HOSPITAL_COMMUNITY)
Admission: EM | Admit: 2013-01-22 | Discharge: 2013-01-26 | DRG: 395 | Disposition: A | Discharge status: Home / Self Care | Payer: Medicare Other | Attending: Emergency Medicine | Admitting: Emergency Medicine

## 2013-01-22 DIAGNOSIS — K219 Gastro-esophageal reflux disease without esophagitis: Secondary | ICD-10-CM | POA: Diagnosis present

## 2013-01-22 DIAGNOSIS — K4090 Unilateral inguinal hernia, without obstruction or gangrene, not specified as recurrent: Secondary | ICD-10-CM | POA: Diagnosis present

## 2013-01-22 DIAGNOSIS — D72829 Elevated white blood cell count, unspecified: Secondary | ICD-10-CM | POA: Diagnosis present

## 2013-01-22 DIAGNOSIS — K3580 Unspecified acute appendicitis: Secondary | ICD-10-CM | POA: Diagnosis not present

## 2013-01-22 DIAGNOSIS — R933 Abnormal findings on diagnostic imaging of other parts of digestive tract: Secondary | ICD-10-CM | POA: Diagnosis not present

## 2013-01-22 DIAGNOSIS — K37 Unspecified appendicitis: Secondary | ICD-10-CM | POA: Diagnosis not present

## 2013-01-22 DIAGNOSIS — N189 Chronic kidney disease, unspecified: Secondary | ICD-10-CM | POA: Diagnosis present

## 2013-01-22 DIAGNOSIS — Z87891 Personal history of nicotine dependence: Secondary | ICD-10-CM | POA: Diagnosis not present

## 2013-01-22 DIAGNOSIS — Z79899 Other long term (current) drug therapy: Secondary | ICD-10-CM | POA: Diagnosis not present

## 2013-01-22 DIAGNOSIS — Z7982 Long term (current) use of aspirin: Secondary | ICD-10-CM | POA: Diagnosis not present

## 2013-01-22 DIAGNOSIS — R1031 Right lower quadrant pain: Secondary | ICD-10-CM | POA: Diagnosis not present

## 2013-01-22 DIAGNOSIS — Z8546 Personal history of malignant neoplasm of prostate: Secondary | ICD-10-CM | POA: Diagnosis not present

## 2013-01-22 DIAGNOSIS — M899 Disorder of bone, unspecified: Secondary | ICD-10-CM | POA: Diagnosis present

## 2013-01-22 HISTORY — DX: Unspecified osteoarthritis, unspecified site: M19.90

## 2013-01-22 HISTORY — DX: Unilateral inguinal hernia, without obstruction or gangrene, not specified as recurrent: K40.90

## 2013-01-22 LAB — URINE MICROSCOPIC-ADD ON

## 2013-01-22 LAB — URINALYSIS, ROUTINE W REFLEX MICROSCOPIC
Glucose, UA: NEGATIVE mg/dL
Leukocytes, UA: NEGATIVE
Nitrite: NEGATIVE
Protein, ur: 30 mg/dL — AB

## 2013-01-22 LAB — CBC WITH DIFFERENTIAL/PLATELET
Basophils Absolute: 0 10*3/uL (ref 0.0–0.1)
Basophils Relative: 0 % (ref 0–1)
Eosinophils Relative: 0 % (ref 0–5)
HCT: 41.2 % (ref 39.0–52.0)
MCHC: 35 g/dL (ref 30.0–36.0)
MCV: 82.2 fL (ref 78.0–100.0)
Monocytes Absolute: 2.4 10*3/uL — ABNORMAL HIGH (ref 0.1–1.0)
RDW: 14 % (ref 11.5–15.5)

## 2013-01-22 LAB — COMPREHENSIVE METABOLIC PANEL
AST: 26 U/L (ref 0–37)
Albumin: 3.7 g/dL (ref 3.5–5.2)
Calcium: 9.5 mg/dL (ref 8.4–10.5)
Creatinine, Ser: 0.96 mg/dL (ref 0.50–1.35)
GFR calc non Af Amer: 84 mL/min — ABNORMAL LOW (ref >90)

## 2013-01-22 MED ORDER — SODIUM CHLORIDE 0.9 % IV SOLN
1.0000 g | INTRAVENOUS | Status: AC
  Administered 2013-01-22 – 2013-01-25 (×4): 1 g via INTRAVENOUS
  Filled 2013-01-22 (×4): qty 1

## 2013-01-22 MED ORDER — ONDANSETRON HCL 4 MG/2ML IJ SOLN
4.0000 mg | Freq: Once | INTRAMUSCULAR | Status: AC
  Administered 2013-01-22: 4 mg via INTRAVENOUS

## 2013-01-22 MED ORDER — IOHEXOL 300 MG/ML  SOLN
80.0000 mL | Freq: Once | INTRAMUSCULAR | Status: AC | PRN
  Administered 2013-01-22: 80 mL via INTRAVENOUS

## 2013-01-22 MED ORDER — PANTOPRAZOLE SODIUM 40 MG IV SOLR
40.0000 mg | Freq: Every day | INTRAVENOUS | Status: DC
  Administered 2013-01-22 – 2013-01-24 (×3): 40 mg via INTRAVENOUS
  Filled 2013-01-22 (×4): qty 40

## 2013-01-22 MED ORDER — HYDROMORPHONE HCL PF 1 MG/ML IJ SOLN
1.0000 mg | Freq: Once | INTRAMUSCULAR | Status: AC
  Administered 2013-01-22: 1 mg via INTRAVENOUS
  Filled 2013-01-22: qty 1

## 2013-01-22 MED ORDER — IOHEXOL 300 MG/ML  SOLN
25.0000 mL | INTRAMUSCULAR | Status: AC
  Administered 2013-01-22 (×2): 25 mL via ORAL

## 2013-01-22 MED ORDER — POTASSIUM CHLORIDE IN NACL 20-0.9 MEQ/L-% IV SOLN
INTRAVENOUS | Status: DC
  Administered 2013-01-22 – 2013-01-25 (×6): via INTRAVENOUS
  Filled 2013-01-22 (×11): qty 1000

## 2013-01-22 MED ORDER — DIPHENHYDRAMINE HCL 50 MG/ML IJ SOLN: 12.5000 mg | Freq: Four times a day (QID) | INTRAMUSCULAR | Status: DC | PRN

## 2013-01-22 MED ORDER — HYDROMORPHONE HCL PF 1 MG/ML IJ SOLN
0.5000 mg | INTRAMUSCULAR | Status: DC | PRN
  Administered 2013-01-22: 1 mg via INTRAVENOUS
  Filled 2013-01-22: qty 1

## 2013-01-22 MED ORDER — SODIUM CHLORIDE 0.9 % IV BOLUS (SEPSIS)
1000.0000 mL | Freq: Once | INTRAVENOUS | Status: AC
  Administered 2013-01-22: 1000 mL via INTRAVENOUS

## 2013-01-22 MED ORDER — DIPHENHYDRAMINE HCL 12.5 MG/5ML PO ELIX
12.5000 mg | ORAL_SOLUTION | Freq: Four times a day (QID) | ORAL | Status: DC | PRN
  Filled 2013-01-22: qty 10

## 2013-01-22 MED ORDER — ONDANSETRON HCL 4 MG/2ML IJ SOLN: 4.0000 mg | Freq: Four times a day (QID) | INTRAMUSCULAR | Status: DC | PRN

## 2013-01-22 MED ORDER — ONDANSETRON HCL 4 MG/2ML IJ SOLN
INTRAMUSCULAR | Status: AC
  Administered 2013-01-22: 08:00:00
  Filled 2013-01-22: qty 2

## 2013-01-22 NOTE — ED Notes (Signed)
Pt. Reports being seen at [facility] in Nov, had a CT diagnosing hernia in lower right abdomen. Denies N/V/D.

## 2013-01-22 NOTE — H&P (Signed)
Jared [name] is an 67 y.o. male.   Chief Complaint: Abdominal pain HPI: 67 yr old male who presents to the [facility] with 48hrs of worsening abdominal pain.  The pain is isolated to the right lower quadrant.  The patient does have inguinal hernias and thought at first it was this.  However normally he can massage the area and it improves.  This pain continued to worsen and was high up then his groin.  He began to have a poor appetite and the pain became severe and sharp.  He denies fevers or chills.  He denies recent sick contacts.  He had a normal BM yesterday.  Denies n/v, constipation, diarrhea or blood in stool.  Past Medical History  Diagnosis Date  . Cancer   . Chronic kidney disease     Past Surgical History  Procedure Laterality Date  . Prostatectomy    . Tonsillectomy    . Eye surgery    . Cataract extraction      Family History  Problem Relation Age of Onset  . Prostate cancer Father   . Pancreatic cancer Mother    Social History:  reports that he quit smoking about 31 years ago. He does not have any smokeless tobacco history on file. He reports that he does not drink alcohol or use illicit drugs.  Allergies:  Allergies  Allergen Reactions  . Claritin (Loratadine) Other (See Comments)    Increases pressure in eyes     (Not in a hospital admission)  Results for orders placed during the hospital encounter of 01/22/13 (from the past 48 hour(s))  CBC WITH DIFFERENTIAL     Status: Abnormal   Collection Time    01/22/13  6:03 AM      Result Value Range   WBC 13.1 (*) 4.0 - 10.5 K/uL   RBC 5.01  4.22 - 5.81 MIL/uL   Hemoglobin 14.4  13.0 - 17.0 g/dL   HCT 45.4  09.8 - 11.9 %   MCV 82.2  78.0 - 100.0 fL   MCH 28.7  26.0 - 34.0 pg   MCHC 35.0  30.0 - 36.0 g/dL   RDW 14.7  82.9 - 56.2 %   Platelets 203  150 - 400 K/uL   Neutrophils Relative 76  43 - 77 %   Neutro Abs 9.9 (*) 1.7 - 7.7 K/uL   Lymphocytes Relative 6 (*) 12 - 46 %   Lymphs Abs 0.7  0.7 - 4.0 K/uL    Monocytes Relative 18 (*) 3 - 12 %   Monocytes Absolute 2.4 (*) 0.1 - 1.0 K/uL   Eosinophils Relative 0  0 - 5 %   Eosinophils Absolute 0.0  0.0 - 0.7 K/uL   Basophils Relative 0  0 - 1 %   Basophils Absolute 0.0  0.0 - 0.1 K/uL  COMPREHENSIVE METABOLIC PANEL     Status: Abnormal   Collection Time    01/22/13  6:03 AM      Result Value Range   Sodium 138  135 - 145 mEq/L   Potassium 4.1  3.5 - 5.1 mEq/L   Chloride 101  96 - 112 mEq/L   CO2 27  19 - 32 mEq/L   Glucose, Bld 127 (*) 70 - 99 mg/dL   BUN 10  6 - 23 mg/dL   Creatinine, Ser 1.30  0.50 - 1.35 mg/dL   Calcium 9.5  8.4 - 86.5 mg/dL   Total Protein 7.2  6.0 - 8.3 g/dL  Albumin 3.7  3.5 - 5.2 g/dL   AST 26  0 - 37 U/L   ALT 24  0 - 53 U/L   Alkaline Phosphatase 71  39 - 117 U/L   Total Bilirubin 0.7  0.3 - 1.2 mg/dL   GFR calc non Af Amer 84 (*) >90 mL/min   GFR calc Af Amer >90  >90 mL/min   Comment:            The eGFR has been calculated     using the CKD EPI equation.     This calculation has not been     validated in all clinical     situations.     eGFR's persistently     <90 mL/min signify     possible Chronic Kidney Disease.  LIPASE, BLOOD     Status: None   Collection Time    01/22/13  6:03 AM      Result Value Range   Lipase 45  11 - 59 U/L  LACTIC ACID, PLASMA     Status: None   Collection Time    01/22/13  6:35 AM      Result Value Range   Lactic Acid, Venous 1.4  0.5 - 2.2 mmol/L  URINALYSIS, ROUTINE W REFLEX MICROSCOPIC     Status: Abnormal   Collection Time    01/22/13  7:00 AM      Result Value Range   Color, Urine AMBER (*) YELLOW   Comment: BIOCHEMICALS MAY BE AFFECTED BY COLOR   APPearance CLEAR  CLEAR   Specific Gravity, Urine 1.026  1.005 - 1.030   pH 7.5  5.0 - 8.0   Glucose, UA NEGATIVE  NEGATIVE mg/dL   Hgb urine dipstick MODERATE (*) NEGATIVE   Bilirubin Urine SMALL (*) NEGATIVE   Ketones, ur 15 (*) NEGATIVE mg/dL   Protein, ur 30 (*) NEGATIVE mg/dL   Urobilinogen, UA 1.0  0.0  - 1.0 mg/dL   Nitrite NEGATIVE  NEGATIVE   Leukocytes, UA NEGATIVE  NEGATIVE  URINE MICROSCOPIC-ADD ON     Status: Abnormal   Collection Time    01/22/13  7:00 AM      Result Value Range   Squamous Epithelial / LPF RARE  RARE   WBC, UA 0-2  <3 WBC/hpf   RBC / HPF 7-10  <3 RBC/hpf   Bacteria, UA RARE  RARE   Crystals CA OXALATE CRYSTALS (*) NEGATIVE   Urine-Other MUCOUS PRESENT     Ct Abdomen Pelvis W Contrast  01/22/2013  *RADIOLOGY REPORT*  Clinical Data: Right lower quadrant pain for 48 hours.  History of right inguinal hernia and prostate cancer.  CT ABDOMEN AND PELVIS WITH CONTRAST  Technique:  Multidetector CT imaging of the abdomen and pelvis was performed following the standard protocol during bolus administration of intravenous contrast.  Contrast: 80mL OMNIPAQUE IOHEXOL 300 MG/ML  SOLN  Comparison: CT abdomen and pelvis 10/01/2012.  Findings: Mild dependent atelectasis is seen in the lung bases. There is no pleural or pericardial effusion.  There is an intense inflammatory process in the right lower quadrant which appears centered about the cecum where there is marked wall thickening extensive infiltration of surrounding fat. The proximal appendix also appears inflamed with an appendicolith identified although this is felt secondary to the patient's cecal process.  The colon is otherwise unremarkable.  The stomach and small bowel appear normal.  The gallbladder, liver, spleen, adrenal glands, pancreas and kidneys appear normal.  The patient is status  post pelvic lymph node dissection and prostatectomy.  Small fat containing bilateral inguinal hernias are noted.  The hernia on the right is larger and has a small amount of fluid within it.  No discrete fluid collection to suggest abscess is identified.  There is some lumbar degenerative change but no focal lytic or sclerotic lesion is noted.  IMPRESSION:  1.  Intense appearing right lower quadrant inflammatory process appears centered about the  cecum.  Underlying neoplasm, although felt less likely given normal appearance of the cecum on the comparison study, cannot be excluded.  Marked inflammation of the proximal appendix is likely secondary to the cecal process with an appendicolith noted. 2.  Small fat containing bilateral inguinal hernias, larger on the right.  Critical Value/emergent results were called by telephone at the time of interpretation on 01/22/2013 at 8:45 a.m. to Dr. Murray Hodgkins, who verbally acknowledged these results.   Original Report Authenticated By: Holley Dexter, M.D.     Review of Systems  Constitutional: Negative for fever, chills and weight loss.       Poor appetite for 24 hours  HENT: Negative.   Eyes: Negative.   Respiratory: Negative.   Cardiovascular: Negative.   Gastrointestinal: Positive for abdominal pain (right lower quadrant). Negative for nausea, vomiting, diarrhea, constipation and blood in stool.  Genitourinary: Negative.   Musculoskeletal: Negative.   Skin: Negative.   Neurological: Negative.   Endo/Heme/Allergies: Negative.   Psychiatric/Behavioral: Negative.     Blood pressure 113/84, pulse 69, temperature 98 F (36.7 C), temperature source Oral, resp. rate 20, SpO2 99.00%. Physical Exam  Constitutional: He is oriented to person, place, and time. He appears well-developed and well-nourished. No distress.  HENT:  Head: Normocephalic and atraumatic.  Eyes: Conjunctivae are normal. Pupils are equal, round, and reactive to light.  Neck: Normal range of motion. Neck supple.  Cardiovascular: Normal rate and regular rhythm.   Respiratory: Effort normal and breath sounds normal.  GI: Soft. There is tenderness (right lower quad). There is rebound (right lower quad).  Bowel sounds faint  Genitourinary:  Deferred  Musculoskeletal: Normal range of motion. He exhibits no edema.  Neurological: He is alert and oriented to person, place, and time.  Skin: Skin is warm and dry.  Psychiatric: He  has a normal mood and affect. His behavior is normal. Thought content normal.     Assessment/Plan 1. Acute cecitis/typhlitis: pt will be admitted and placed on Invanz, IVFs, bowel rest and ice chips.  We will consult GI to further evaluate.  The patient will be ambulatory.  We will monitor the patient's symptoms and follow his leukocytosis.  Dr. Janee Morn will see the patient later today and make further recommendations.  The patient's diagnosis and current treatment plan was discussed with the patient who expresses understanding.  Annasofia Pohl 01/22/2013, 10:24 AM

## 2013-01-22 NOTE — ED Provider Notes (Signed)
History     CSN: [id number]  Arrival date & time 01/22/13  5784   First MD Initiated Contact with Patient 01/22/13 0602      Chief Complaint  Patient presents with  . Abdominal Pain    (Consider location/radiation/quality/duration/timing/severity/associated sxs/prior treatment) HPI Comments: Patient is a 67 year old male with a past medical history of right inguinal herniawho presents with a 12 hour history of acutely worsening RLQ abdominal pain. The pain is located in the right lower quadrant and inguinal area and does not radiate. The pain is described as sharp and severe. The pain started gradually and progressively worsened since the onset. No alleviating/aggravating factors. The patient has tried nothing for symptoms without relief. Associated symptoms include nothing. Patient reports his hernia gives him trouble sometimes but he is usually able to massage the area or reduce the hernia which provides relief. He was unable to do that today. Patient denies fever, headache, NVD, chest pain, SOB, dysuria, constipation. Patient sees Dr. Derrell Lolling at [facility] surgery.     Patient is a 67 y.o. male presenting with abdominal pain.  Abdominal Pain   Past Medical History  Diagnosis Date  . Cancer   . Chronic kidney disease     Past Surgical History  Procedure Laterality Date  . Prostatectomy    . Tonsillectomy    . Eye surgery    . Cataract extraction      Family History  Problem Relation Age of Onset  . Prostate cancer Father   . Pancreatic cancer Mother     History  Substance Use Topics  . Smoking status: Former Smoker    Quit date: 10/29/1981  . Smokeless tobacco: Not on file  . Alcohol Use: No      Review of Systems  Gastrointestinal: Positive for abdominal pain.  All other systems reviewed and are negative.    Allergies  Claritin  Home Medications   Current Outpatient Rx  Name  Route  Sig  Dispense  Refill  . Ascorbic Acid (VITAMIN C) 500 MG  tablet   Oral   Take 500 mg by mouth daily.           Marland Kitchen aspirin 325 MG tablet   Oral   Take 325 mg by mouth daily.           . calcium elemental as carbonate (TUMS ULTRA 1000) 400 MG tablet   Oral   Chew 1,000 mg by mouth 2 (two) times daily.          . calcium-vitamin D 185 MG TABS   Oral   Take 185 mg by mouth daily.           . Garlic 1000 MG CAPS   Oral   Take 1 capsule by mouth daily.           Marland Kitchen ibuprofen (ADVIL,MOTRIN) 800 MG tablet   Oral   Take 1 tablet (800 mg total) by mouth every 8 (eight) hours as needed for pain.   30 tablet   2   . Multiple Vitamins-Minerals (CENTRUM SILVER PO)   Oral   Take 1 tablet by mouth daily.           . Triptorelin Pamoate 11.25 MG SUSR      Take injections quarterly          . Vitamins-Lipotropics (B-100 COMPLEX) TABS   Oral   Take 1 tablet by mouth daily.  BP 125/68  Pulse 92  Temp(Src) 98 F (36.7 C) (Oral)  Resp 20  SpO2 100%  Physical Exam  Nursing note and vitals reviewed. Constitutional: He is oriented to person, place, and time. He appears well-developed and well-nourished. No distress.  HENT:  Head: Normocephalic and atraumatic.  Eyes: Conjunctivae and EOM are normal. No scleral icterus.  Neck: Normal range of motion. Neck supple.  Cardiovascular: Normal rate and regular rhythm.  Exam reveals no gallop and no friction rub.   No murmur heard. Pulmonary/Chest: Effort normal and breath sounds normal. He has no wheezes. He has no rales. He exhibits no tenderness.  Abdominal: Soft. He exhibits no distension. There is tenderness. There is no rebound and no guarding.  RLQ and right inguinal tenderness to palpation. Unable to identify any reducible hernia due to body habitus.   Musculoskeletal: Normal range of motion.  Neurological: He is alert and oriented to person, place, and time. Coordination normal.  Speech is goal-oriented. Moves limbs without ataxia.   Skin: Skin is warm and dry.   Psychiatric: He has a normal mood and affect. His behavior is normal.    ED Course  Procedures (including critical care time)  Labs Reviewed  CBC WITH DIFFERENTIAL - Abnormal; Notable for the following:    WBC 13.1 (*)    Neutro Abs 9.9 (*)    Lymphocytes Relative 6 (*)    Monocytes Relative 18 (*)    Monocytes Absolute 2.4 (*)    All other components within normal limits  COMPREHENSIVE METABOLIC PANEL - Abnormal; Notable for the following:    Glucose, Bld 127 (*)    GFR calc non Af Amer 84 (*)    All other components within normal limits  URINALYSIS, ROUTINE W REFLEX MICROSCOPIC - Abnormal; Notable for the following:    Color, Urine AMBER (*)    Hgb urine dipstick MODERATE (*)    Bilirubin Urine SMALL (*)    Ketones, ur 15 (*)    Protein, ur 30 (*)    All other components within normal limits  URINE MICROSCOPIC-ADD ON - Abnormal; Notable for the following:    Crystals CA OXALATE CRYSTALS (*)    All other components within normal limits  URINE CULTURE  LIPASE, BLOOD  LACTIC ACID, PLASMA   Ct Abdomen Pelvis W Contrast  01/22/2013  *RADIOLOGY REPORT*  Clinical Data: Right lower quadrant pain for 48 hours.  History of right inguinal hernia and prostate cancer.  CT ABDOMEN AND PELVIS WITH CONTRAST  Technique:  Multidetector CT imaging of the abdomen and pelvis was performed following the standard protocol during bolus administration of intravenous contrast.  Contrast: 80mL OMNIPAQUE IOHEXOL 300 MG/ML  SOLN  Comparison: CT abdomen and pelvis 10/01/2012.  Findings: Mild dependent atelectasis is seen in the lung bases. There is no pleural or pericardial effusion.  There is an intense inflammatory process in the right lower quadrant which appears centered about the cecum where there is marked wall thickening extensive infiltration of surrounding fat. The proximal appendix also appears inflamed with an appendicolith identified although this is felt secondary to the patient's cecal process.   The colon is otherwise unremarkable.  The stomach and small bowel appear normal.  The gallbladder, liver, spleen, adrenal glands, pancreas and kidneys appear normal.  The patient is status post pelvic lymph node dissection and prostatectomy.  Small fat containing bilateral inguinal hernias are noted.  The hernia on the right is larger and has a small amount of fluid within it.  No discrete fluid collection to suggest abscess is identified.  There is some lumbar degenerative change but no focal lytic or sclerotic lesion is noted.  IMPRESSION:  1.  Intense appearing right lower quadrant inflammatory process appears centered about the cecum.  Underlying neoplasm, although felt less likely given normal appearance of the cecum on the comparison study, cannot be excluded.  Marked inflammation of the proximal appendix is likely secondary to the cecal process with an appendicolith noted. 2.  Small fat containing bilateral inguinal hernias, larger on the right.  Critical Value/emergent results were called by telephone at the time of interpretation on 01/22/2013 at 8:45 a.m. to Dr. Murray Hodgkins, who verbally acknowledged these results.   Original Report Authenticated By: Holley Dexter, M.D.      1. Nonspecific (abnormal) findings on radiological and other examination of gastrointestinal tract   2. Abdominal pain, right lower quadrant       MDM  6:17 AM Labs and CT pending. Patient will have morphine and zofran. Patient is afebrile with stable vitals at this time.   9:42 AM Patient has elevated WBC at 13.1. CT scan shows RLQ inflammatory process. I paged General Surgery who will see the patient.       Emilia Beck, PA-C 01/23/13 1910

## 2013-01-22 NOTE — ED Notes (Signed)
Pt. Returned from CT and stated, "I feel like i could eat a steak now"

## 2013-01-22 NOTE — H&P (Signed)
Typhlitis - Invanz+bowel rest.  Patient has already improved after 1st dose ABX. Appreciate GI input.  9 years since last colonoscopy Patient examined and I agree with the assessment and plan  Violeta Gelinas, MD, MPH, FACS Pager: [phone]  01/22/2013 10:47 PM

## 2013-01-22 NOTE — Consult Note (Signed)
[facility] Consultation  Referring Provider:     [facility] Primary Care Physician:  Carney Living, MD Primary Gastroenterologist:  Stan Head, MD       Reason for Consultation :      Typhlitis     HPI: Jared [name] is a 67 y.o. male known to Dr. Leone Payor in our practice though he hasn't been seen by [place] in a few years. We last saw patient in 2010 at which time he was diagnosed with GERD.   Patient presented to ED today with abdominal pain which he initially though was related to known right inguinal hernia. RLQ pain started out as discomfort but then escalated to become significant pain yesterday afternoon. He hasn't had any associated bowel changes. He had a normal BM yesterday. No blood in stools. No nausea. No fevers.  In ED WBC was 13.1. Contrast CTscan reveals marked wall thickening of cecum with extensive infiltration of surrounding fat. The proximal appendix appears inflamed but secondary to cecal process. Right inguinal hernia is larger and contains a small amount of fluid. Surgery admitted patient for typlitis , started Invanz, bowel rest, IVF and asked [place] to evaluate.    Past Medical History  Diagnosis Date  . Cancer, prostate   . Chronic kidney disease     Past Surgical History  Procedure Laterality Date  . Prostatectomy    . Tonsillectomy    . Eye surgery    . Cataract extraction      Family History  Problem Relation Age of Onset  . Prostate cancer Father   . Pancreatic cancer Mother      History  Substance Use Topics  . Smoking status: Former Smoker    Quit date: 10/29/1981  . Smokeless tobacco: Not on file  . Alcohol Use: No    Prior to Admission medications   Medication Sig Start Date End Date Taking? Authorizing Provider  Ascorbic Acid (VITAMIN C) 500 MG tablet Take 500 mg by mouth daily.     Yes Historical Provider, MD  aspirin 325 MG tablet Take 325 mg by mouth daily.     Yes Historical Provider, MD  bimatoprost  (LUMIGAN) 0.01 % SOLN Place 1 drop into the left eye at bedtime.   Yes Historical Provider, MD  brinzolamide (AZOPT) 1 % ophthalmic suspension Place 1 drop into the left eye 3 (three) times daily.   Yes Historical Provider, MD  CALCIUM PO Take 1 tablet by mouth 2 (two) times daily. 800mg    Yes Historical Provider, MD  Cholecalciferol (VITAMIN D-3) 1000 UNITS CAPS Take 1 capsule by mouth daily.   Yes Historical Provider, MD  Garlic 1000 MG CAPS Take 1 capsule by mouth daily.     Yes Historical Provider, MD  Multiple Vitamins-Minerals (CENTRUM SILVER PO) Take 1 tablet by mouth daily.     Yes Historical Provider, MD  Vitamins-Lipotropics (B-100 COMPLEX) TABS Take 1 tablet by mouth daily.     Yes Historical Provider, MD  Triptorelin Pamoate 11.25 MG SUSR Inject 11.25 mg into the muscle every 3 (three) months. Take injections quarterly    Historical Provider, MD    Current Facility-Administered Medications  Medication Dose Route Frequency Provider Last Rate Last Dose  . ertapenem (INVANZ) 1 g in sodium chloride 0.9 % 50 mL IVPB  1 g Intravenous Q24H Doristine Mango, PA-C        Allergies as of 01/22/2013 - Review Complete 01/22/2013  Allergen Reaction Noted  . Claritin (loratadine) Other (See Comments)  09/19/2012     Review of Systems:    All systems reviewed and negative except where noted in HPI.  PHYSICAL EXAM: Vital signs in last 24 hours: Temp:  [98 F (36.7 C)-98.7 F (37.1 C)] 98.7 F (37.1 C) (03/27 1150) Pulse Rate:  [65-101] 65 (03/27 1150) Resp:  [20] 20 (03/27 1150) BP: (107-125)/(63-84) 123/73 mmHg (03/27 1150) SpO2:  [78 %-100 %] 100 % (03/27 1150)   General:   Pleasant black male in NAD Head:  Normocephalic and atraumatic. Eyes:   No icterus.   Conjunctiva pink. Ears:  Normal auditory acuity. Neck:  Supple; no masses felt Lungs:  Respirations even and unlabored. Lungs clear to auscultation bilaterally.   No wheezes, crackles, or rhonchi.  Heart:  Regular rate and  rhythm. Abdomen:  Soft, nondistended. Significant RLQ tenderness. Hypoactive bowel sounds. No appreciable masses or hepatomegaly.  Rectal:  Not performed.  Msk:  Symmetrical without gross deformities.  Extremities:  Without edema. Neurologic:  Alert and  oriented x4;  grossly normal neurologically. Skin:  Intact without significant lesions or rashes. Cervical Nodes:  No significant cervical adenopathy. Psych:  Alert and cooperative. Normal affect.  LAB RESULTS:  Recent Labs  01/22/13 0603  WBC 13.1*  HGB 14.4  HCT 41.2  PLT 203   BMET  Recent Labs  01/22/13 0603  NA 138  K 4.1  CL 101  CO2 27  GLUCOSE 127*  BUN 10  CREATININE 0.96  CALCIUM 9.5   LFT  Recent Labs  01/22/13 0603  PROT 7.2  ALBUMIN 3.7  AST 26  ALT 24  ALKPHOS 71  BILITOT 0.7    STUDIES: Ct Abdomen Pelvis W Contrast  01/22/2013  *RADIOLOGY REPORT*  Clinical Data: Right lower quadrant pain for 48 hours.  History of right inguinal hernia and prostate cancer.  CT ABDOMEN AND PELVIS WITH CONTRAST  Technique:  Multidetector CT imaging of the abdomen and pelvis was performed following the standard protocol during bolus administration of intravenous contrast.  Contrast: 80mL OMNIPAQUE IOHEXOL 300 MG/ML  SOLN  Comparison: CT abdomen and pelvis 10/01/2012.  Findings: Mild dependent atelectasis is seen in the lung bases. There is no pleural or pericardial effusion.  There is an intense inflammatory process in the right lower quadrant which appears centered about the cecum where there is marked wall thickening extensive infiltration of surrounding fat. The proximal appendix also appears inflamed with an appendicolith identified although this is felt secondary to the patient's cecal process.  The colon is otherwise unremarkable.  The stomach and small bowel appear normal.  The gallbladder, liver, spleen, adrenal glands, pancreas and kidneys appear normal.  The patient is status post pelvic lymph node dissection and  prostatectomy.  Small fat containing bilateral inguinal hernias are noted.  The hernia on the right is larger and has a small amount of fluid within it.  No discrete fluid collection to suggest abscess is identified.  There is some lumbar degenerative change but no focal lytic or sclerotic lesion is noted.  IMPRESSION:  1.  Intense appearing right lower quadrant inflammatory process appears centered about the cecum.  Underlying neoplasm, although felt less likely given normal appearance of the cecum on the comparison study, cannot be excluded.  Marked inflammation of the proximal appendix is likely secondary to the cecal process with an appendicolith noted. 2.  Small fat containing bilateral inguinal hernias, larger on the right.  Critical Value/emergent results were called by telephone at the time of interpretation on 01/22/2013 at  8:45 a.m. to Dr. Murray Hodgkins, who verbally acknowledged these results.   Original Report Authenticated By: Holley Dexter, M.D.      PREVIOUS ENDOSCOPIES:             Colonoscopy Leone Payor) March 2005 for screening.. Exam to cecum. Excellent prep. Findings:   Normal  IMPRESSION / PLAN:     Pleasant 67 year old black male admitted with typhlitis. Patient not immunocompromised. Doubt ischemic as vessels look okay on CTscan (Dr. Marina Goodell reviewed). Suspect infectious etiology though no associated bowel changes (at least not yet). Recommend supportive care, advance diet as tolerated. If better tomorrow he can hopefully go home with plans to follow up in our office with Dr. Leone Payor. Will see him back in am.         Thanks   LOS: 0 days   Willette Cluster  01/22/2013, 11:51 AM  GI ATTENDING  Patient personally seen and examined. Wife in room. Laboratories and x-rays also reviewed. Prior colonoscopy report review. Recent CT reviewed as well as current. Patient presents with acute right lower quadrant abdominal pain. CT scan shows inflammatory changes of the cecum and ileum.  Appendix looks okay though there is an appendicolith. This patient's problem is most likely secondary to infection such as Campylobacter jejuni or Yersinia. Diarrhea may or may not follow. Alternatively, this could be ischemia, though doubt. Isolated cecal diverticulitis less likely. Feeling better today. I anticipate that he will continue to improve. Recent unremarkable CT scan of the abdomen mitigates against inflammatory bowel disease or cancer. Continue with supportive care and advance diet as tolerated. I agree with outpatient GI followup with Dr. Leone Payor after discharge. Thanks.  Wilhemina Bonito. Eda Keys., M.D. [facility] Division of Gastroenterology.

## 2013-01-22 NOTE — ED Notes (Signed)
Patient transported to CT 

## 2013-01-22 NOTE — Progress Notes (Signed)
UR completed 

## 2013-01-22 NOTE — ED Notes (Signed)
Report from Jody, RN.

## 2013-01-23 DIAGNOSIS — R933 Abnormal findings on diagnostic imaging of other parts of digestive tract: Secondary | ICD-10-CM | POA: Diagnosis not present

## 2013-01-23 DIAGNOSIS — R1031 Right lower quadrant pain: Secondary | ICD-10-CM | POA: Diagnosis not present

## 2013-01-23 DIAGNOSIS — K3580 Unspecified acute appendicitis: Secondary | ICD-10-CM | POA: Diagnosis not present

## 2013-01-23 LAB — CBC
HCT: 39.7 % (ref 39.0–52.0)
Hemoglobin: 13.5 g/dL (ref 13.0–17.0)
MCH: 28.8 pg (ref 26.0–34.0)
MCHC: 34 g/dL (ref 30.0–36.0)
MCV: 84.8 fL (ref 78.0–100.0)
Platelets: 198 10*3/uL (ref 150–400)
RBC: 4.68 MIL/uL (ref 4.22–5.81)
RDW: 14.2 % (ref 11.5–15.5)
WBC: 11.8 10*3/uL — ABNORMAL HIGH (ref 4.0–10.5)

## 2013-01-23 LAB — BASIC METABOLIC PANEL
BUN: 10 mg/dL (ref 6–23)
CO2: 25 mEq/L (ref 19–32)
Calcium: 8.7 mg/dL (ref 8.4–10.5)
Chloride: 105 mEq/L (ref 96–112)
Creatinine, Ser: 0.9 mg/dL (ref 0.50–1.35)
GFR calc Af Amer: >90 mL/min (ref >90)
GFR calc non Af Amer: 86 mL/min — ABNORMAL LOW (ref >90)
Glucose, Bld: 104 mg/dL — ABNORMAL HIGH (ref 70–99)
Potassium: 4.5 mEq/L (ref 3.5–5.1)
Sodium: 141 mEq/L (ref 135–145)

## 2013-01-23 LAB — URINE CULTURE: Colony Count: NO GROWTH

## 2013-01-23 MED ORDER — ENSURE COMPLETE PO LIQD
237.0000 mL | Freq: Two times a day (BID) | ORAL | Status: DC
  Administered 2013-01-23 (×2): 237 mL via ORAL

## 2013-01-23 MED ORDER — ENSURE PUDDING PO PUDG: 1.0000 | Freq: Three times a day (TID) | ORAL | Status: DC

## 2013-01-23 MED ORDER — ENOXAPARIN SODIUM 40 MG/0.4ML ~~LOC~~ SOLN
40.0000 mg | SUBCUTANEOUS | Status: DC
  Administered 2013-01-23 – 2013-01-25 (×3): 40 mg via SUBCUTANEOUS
  Filled 2013-01-23 (×4): qty 0.4

## 2013-01-23 NOTE — Progress Notes (Signed)
Less pain Ambulating Continue Invanz Patient examined and I agree with the assessment and plan  Violeta Gelinas, MD, MPH, FACS Pager: [phone]  01/23/2013 4:07 PM

## 2013-01-23 NOTE — Progress Notes (Signed)
Subjective: Pt feels much improved today.  Pt passing flatus, no BM yet.  Tolerating full liquids.  Pt walking well through halls.    Objective: Vital signs in last 24 hours: Temp:  [98.5 F (36.9 C)-99.5 F (37.5 C)] 99.5 F (37.5 C) (03/28 0535) Pulse Rate:  [65-82] 69 (03/28 0535) Resp:  [16-20] 16 (03/28 0535) BP: (95-123)/(51-87) 119/65 mmHg (03/28 0535) SpO2:  [96 %-100 %] 96 % (03/28 0535) Weight:  [232 lb (105.235 kg)] 232 lb (105.235 kg) (03/27 1511) Last BM Date: 01/21/13  Intake/Output from previous day: 03/27 0701 - 03/28 0700 In: 1719 [I.V.:1719] Out: -  Intake/Output this shift:    PE: Gen:  Alert, NAD, pleasant Abd: Soft, minimal abdominal pain with deep palpation in RLQ, ND, +BS, no HSM   Lab Results:   Recent Labs  01/22/13 0603 01/23/13 0617  WBC 13.1* 11.8*  HGB 14.4 13.5  HCT 41.2 39.7  PLT 203 198   BMET  Recent Labs  01/22/13 0603 01/23/13 0617  NA 138 141  K 4.1 4.5  CL 101 105  CO2 27 25  GLUCOSE 127* 104*  BUN 10 10  CREATININE 0.96 0.90  CALCIUM 9.5 8.7   PT/INR No results found for this basename: LABPROT, INR,  in the last 72 hours CMP     Component Value Date/Time   NA 141 01/23/2013 0617   K 4.5 01/23/2013 0617   CL 105 01/23/2013 0617   CO2 25 01/23/2013 0617   GLUCOSE 104* 01/23/2013 0617   BUN 10 01/23/2013 0617   CREATININE 0.90 01/23/2013 0617   CREATININE 0.93 02/27/2012 0929   CALCIUM 8.7 01/23/2013 0617   PROT 7.2 01/22/2013 0603   ALBUMIN 3.7 01/22/2013 0603   AST 26 01/22/2013 0603   ALT 24 01/22/2013 0603   ALKPHOS 71 01/22/2013 0603   BILITOT 0.7 01/22/2013 0603   GFRNONAA 86* 01/23/2013 0617   GFRAA >90 01/23/2013 0617   Lipase     Component Value Date/Time   LIPASE 45 01/22/2013 0603       Studies/Results: Ct Abdomen Pelvis W Contrast  01/22/2013  *RADIOLOGY REPORT*  Clinical Data: Right lower quadrant pain for 48 hours.  History of right inguinal hernia and prostate cancer.  CT ABDOMEN AND PELVIS WITH  CONTRAST  Technique:  Multidetector CT imaging of the abdomen and pelvis was performed following the standard protocol during bolus administration of intravenous contrast.  Contrast: 80mL OMNIPAQUE IOHEXOL 300 MG/ML  SOLN  Comparison: CT abdomen and pelvis 10/01/2012.  Findings: Mild dependent atelectasis is seen in the lung bases. There is no pleural or pericardial effusion.  There is an intense inflammatory process in the right lower quadrant which appears centered about the cecum where there is marked wall thickening extensive infiltration of surrounding fat. The proximal appendix also appears inflamed with an appendicolith identified although this is felt secondary to the patient's cecal process.  The colon is otherwise unremarkable.  The stomach and small bowel appear normal.  The gallbladder, liver, spleen, adrenal glands, pancreas and kidneys appear normal.  The patient is status post pelvic lymph node dissection and prostatectomy.  Small fat containing bilateral inguinal hernias are noted.  The hernia on the right is larger and has a small amount of fluid within it.  No discrete fluid collection to suggest abscess is identified.  There is some lumbar degenerative change but no focal lytic or sclerotic lesion is noted.  IMPRESSION:  1.  Intense appearing right lower quadrant inflammatory  process appears centered about the cecum.  Underlying neoplasm, although felt less likely given normal appearance of the cecum on the comparison study, cannot be excluded.  Marked inflammation of the proximal appendix is likely secondary to the cecal process with an appendicolith noted. 2.  Small fat containing bilateral inguinal hernias, larger on the right.  Critical Value/emergent results were called by telephone at the time of interpretation on 01/22/2013 at 8:45 a.m. to Dr. Murray Hodgkins, who verbally acknowledged these results.   Original Report Authenticated By: Holley Dexter, M.D.      Anti-infectives: Anti-infectives   Start     Dose/Rate Route Frequency Ordered Stop   01/22/13 1030  ertapenem (INVANZ) 1 g in sodium chloride 0.9 % 50 mL IVPB     1 g 100 mL/hr over 30 Minutes Intravenous Every 24 hours 01/22/13 1025         Assessment/Plan Acute cecitis/typhlitis - already responding to abx 1.  Conservative treatment - Invanz, IVFs, advance to fulls (do not advance past this).  2.  GI following - will f/u with Dr. Leone Payor in the office as an OP 3.  Pain control, antiemetics 4.  Ambulation and IS, SCD's, lovenox 5.  If continues to improve can eventually advance to reg diet and discharge home, maybe Sunday or Monday  6.  If he acutely worsens may need right hemicolectomy or ileocecectomy  Leukocytosis much improved    LOS: 1 day    DORT, Ania Levay 01/23/2013, 7:39 AM Pager: [phone]

## 2013-01-23 NOTE — Progress Notes (Signed)
     Jared [name] Daily Rounding Note 01/23/2013, 11:32 AM  SUBJECTIVE:       Felling much better.  No pain meds since 1500 yesterday.  Walking in halls a lot.  Loose BM this AM after drinking Ensure.   OBJECTIVE:         Vital signs in last 24 hours:    Temp:  [98.5 F (36.9 C)-99.5 F (37.5 C)] 99.5 F (37.5 C) (03/28 0535) Pulse Rate:  [65-82] 69 (03/28 0535) Resp:  [16-20] 16 (03/28 0535) BP: (95-123)/(51-87) 119/65 mmHg (03/28 0535) SpO2:  [96 %-100 %] 96 % (03/28 0535) Weight:  [105.235 kg (232 lb)] 105.235 kg (232 lb) (03/27 1511) Last BM Date: 01/21/13 General: looks well, not at all ill looking   Heart: RRR Chest: clear B.  No resp distress Abdomen: soft, still tender but no guard/rebound in right abdomen. Extremities: no CCE Neuro/Psych:  Pleasant, talkative.  No tremor.     Intake/Output from previous day: 03/27 0701 - 03/28 0700 In: 1719 [I.V.:1719] Out: -   Intake/Output this shift:    Lab Results:  Recent Labs  01/22/13 0603 01/23/13 0617  WBC 13.1* 11.8*  HGB 14.4 13.5  HCT 41.2 39.7  PLT 203 198   BMET  Recent Labs  01/22/13 0603 01/23/13 0617  NA 138 141  K 4.1 4.5  CL 101 105  CO2 27 25  GLUCOSE 127* 104*  BUN 10 10  CREATININE 0.96 0.90  CALCIUM 9.5 8.7   LFT  Recent Labs  01/22/13 0603  PROT 7.2  ALBUMIN 3.7  AST 26  ALT 24  ALKPHOS 71  BILITOT 0.7    Studies/Results: Ct Abdomen Pelvis W Contrast 01/22/2013    IMPRESSION:  1.  Intense appearing right lower quadrant inflammatory process appears centered about the cecum.  Underlying neoplasm, although felt less likely given normal appearance of the cecum on the comparison study, cannot be excluded.  Marked inflammation of the proximal appendix is likely secondary to the cecal process with an appendicolith noted. 2.  Small fat containing bilateral inguinal hernias, larger on the right.  Critical Value/emergent results were called by telephone at the time of interpretation on  01/22/2013 at 8:45 a.m. to Dr. Murray Hodgkins, who verbally acknowledged these results.   Original Report Authenticated By: Holley Dexter, M.D.     ASSESMENT: *  Acute typhlitis. Infectious etiology suspected in non-immunocompromised pt.   Day 2 abx:  Invanz. Clinically much improved, WBCs on decline.    PLAN: *  Would advance to low res diet, but defer decision to gen surgeon.  *  Continue Invanz.    LOS: 1 day   [name]  01/23/2013, 11:32 AM Pager: [phone]  [name] ATTENDING  History, laboratories reviewed. Agree with above. Wife in room. Patient seen and examined. Doing better. Let us pain. Tolerating diet. Recommend advancing diet as tolerated. He is ambulating. Anticipate that you can discharge him home tomorrow. A few days of antibiotics would be okay. I have instructed them to followup with Dr. Leone Payor in a few weeks. We are available as needed. Will sign off. Thanks  Wilhemina Bonito. Eda Keys., M.D. [facility] Division of Gastroenterology

## 2013-01-24 DIAGNOSIS — R1031 Right lower quadrant pain: Secondary | ICD-10-CM | POA: Diagnosis not present

## 2013-01-24 DIAGNOSIS — K3580 Unspecified acute appendicitis: Secondary | ICD-10-CM | POA: Diagnosis not present

## 2013-01-24 MED ORDER — OXYCODONE HCL 5 MG PO TABS: 5.0000 mg | ORAL_TABLET | ORAL | Status: DC | PRN

## 2013-01-24 MED ORDER — ACETAMINOPHEN 325 MG PO TABS: 650.0000 mg | ORAL_TABLET | Freq: Four times a day (QID) | ORAL | Status: DC | PRN

## 2013-01-24 NOTE — Progress Notes (Signed)
Patient ID: Jared [name], male   DOB: Aug 20, 1946, 67 y.o.   MRN: [id number]    Subjective: Continues to feel better, pain only with palp now, denies n/v, denies fevers/chills, walking 5/times/day  Objective: Vital signs in last 24 hours: Temp:  [98.4 F (36.9 C)-98.9 F (37.2 C)] 98.4 F (36.9 C) (03/29 0620) Pulse Rate:  [61-72] 66 (03/29 0620) Resp:  [16-19] 16 (03/29 0620) BP: (106-118)/(60-63) 106/60 mmHg (03/29 0620) SpO2:  [99 %] 99 % (03/29 0620) Last BM Date: 01/23/13  Intake/Output from previous day: 03/28 0701 - 03/29 0700 In: 1872 [P.O.:480; I.V.:1392] Out: 1 [Urine:1] Intake/Output this shift:    PE: Gen:  Alert, NAD, pleasant Lungs; CTA bil Heart: RRR Abd: Soft, minimal abdominal pain with deep palpation in RLQ, ND, +BS   Lab Results:   Recent Labs  01/22/13 0603 01/23/13 0617  WBC 13.1* 11.8*  HGB 14.4 13.5  HCT 41.2 39.7  PLT 203 198   BMET  Recent Labs  01/22/13 0603 01/23/13 0617  NA 138 141  K 4.1 4.5  CL 101 105  CO2 27 25  GLUCOSE 127* 104*  BUN 10 10  CREATININE 0.96 0.90  CALCIUM 9.5 8.7   PT/INR No results found for this basename: LABPROT, INR,  in the last 72 hours CMP     Component Value Date/Time   NA 141 01/23/2013 0617   K 4.5 01/23/2013 0617   CL 105 01/23/2013 0617   CO2 25 01/23/2013 0617   GLUCOSE 104* 01/23/2013 0617   BUN 10 01/23/2013 0617   CREATININE 0.90 01/23/2013 0617   CREATININE 0.93 02/27/2012 0929   CALCIUM 8.7 01/23/2013 0617   PROT 7.2 01/22/2013 0603   ALBUMIN 3.7 01/22/2013 0603   AST 26 01/22/2013 0603   ALT 24 01/22/2013 0603   ALKPHOS 71 01/22/2013 0603   BILITOT 0.7 01/22/2013 0603   GFRNONAA 86* 01/23/2013 0617   GFRAA >90 01/23/2013 0617   Lipase     Component Value Date/Time   LIPASE 45 01/22/2013 0603       Studies/Results: Ct Abdomen Pelvis W Contrast  01/22/2013  *RADIOLOGY REPORT*  Clinical Data: Right lower quadrant pain for 48 hours.  History of right inguinal hernia and prostate  cancer.  CT ABDOMEN AND PELVIS WITH CONTRAST  Technique:  Multidetector CT imaging of the abdomen and pelvis was performed following the standard protocol during bolus administration of intravenous contrast.  Contrast: 80mL OMNIPAQUE IOHEXOL 300 MG/ML  SOLN  Comparison: CT abdomen and pelvis 10/01/2012.  Findings: Mild dependent atelectasis is seen in the lung bases. There is no pleural or pericardial effusion.  There is an intense inflammatory process in the right lower quadrant which appears centered about the cecum where there is marked wall thickening extensive infiltration of surrounding fat. The proximal appendix also appears inflamed with an appendicolith identified although this is felt secondary to the patient's cecal process.  The colon is otherwise unremarkable.  The stomach and small bowel appear normal.  The gallbladder, liver, spleen, adrenal glands, pancreas and kidneys appear normal.  The patient is status post pelvic lymph node dissection and prostatectomy.  Small fat containing bilateral inguinal hernias are noted.  The hernia on the right is larger and has a small amount of fluid within it.  No discrete fluid collection to suggest abscess is identified.  There is some lumbar degenerative change but no focal lytic or sclerotic lesion is noted.  IMPRESSION:  1.  Intense appearing right lower quadrant  inflammatory process appears centered about the cecum.  Underlying neoplasm, although felt less likely given normal appearance of the cecum on the comparison study, cannot be excluded.  Marked inflammation of the proximal appendix is likely secondary to the cecal process with an appendicolith noted. 2.  Small fat containing bilateral inguinal hernias, larger on the right.  Critical Value/emergent results were called by telephone at the time of interpretation on 01/22/2013 at 8:45 a.m. to Dr. Murray Hodgkins, who verbally acknowledged these results.   Original Report Authenticated By: Holley Dexter, M.D.      Anti-infectives: Anti-infectives   Start     Dose/Rate Route Frequency Ordered Stop   01/22/13 1030  ertapenem (INVANZ) 1 g in sodium chloride 0.9 % 50 mL IVPB     1 g 100 mL/hr over 30 Minutes Intravenous Every 24 hours 01/22/13 1025         Assessment/Plan Acute cecitis/typhlitis - improving, symptoms mostly resolved now, will advance diet to soft, keep on IV abx today and poss switch to PO tomorrow.  If does well then could discharge home Monday.  SCDs, lovenox, add Tylenol and oxycodone for less severe pain  GI following - will f/u with Dr. Leone Payor in the office as an OP  If he acutely worsens may need right hemicolectomy or ileocecectomy   LOS: 2 days    Tailyn Hantz 01/24/2013, 8:17 AM

## 2013-01-24 NOTE — ED Provider Notes (Signed)
Shared service with midlevel provider. I have personally seen and examined the patient, providing direct face to face care, presenting with the chief complaint of abdominal pain. Physical exam findings include diffuse tenderness, worst over the RLQ. Plan will be to get CT abdomen and consult surgery based on the findings. I have reviewed the nursing documentation on past medical history, family history, and social history.  Derwood Kaplan, MD 01/24/13 1546

## 2013-01-24 NOTE — Progress Notes (Signed)
Agree with A&P of EW,PA. He is progressing nicely and hopefully home tomorrow

## 2013-01-25 DIAGNOSIS — R1031 Right lower quadrant pain: Secondary | ICD-10-CM | POA: Diagnosis not present

## 2013-01-25 DIAGNOSIS — K3580 Unspecified acute appendicitis: Secondary | ICD-10-CM | POA: Diagnosis not present

## 2013-01-25 MED ORDER — PANTOPRAZOLE SODIUM 40 MG PO TBEC
40.0000 mg | DELAYED_RELEASE_TABLET | Freq: Every day | ORAL | Status: DC
  Administered 2013-01-25: 40 mg via ORAL
  Filled 2013-01-25: qty 1

## 2013-01-25 MED ORDER — AMOXICILLIN-POT CLAVULANATE 875-125 MG PO TABS
1.0000 | ORAL_TABLET | Freq: Two times a day (BID) | ORAL | Status: DC
  Administered 2013-01-25 – 2013-01-26 (×3): 1 via ORAL
  Filled 2013-01-25 (×4): qty 1

## 2013-01-25 NOTE — Progress Notes (Signed)
Patient ID: Jared [name], male   DOB: Aug 07, 1946, 67 y.o.   MRN: [id number]    Subjective: Pain now resolved, denies n/v, denies fevers/chills, walking 5/times/day  Objective: Vital signs in last 24 hours: Temp:  [97.5 F (36.4 C)-98.5 F (36.9 C)] 97.5 F (36.4 C) (03/30 0535) Pulse Rate:  [58-63] 62 (03/30 0535) Resp:  [18] 18 (03/30 0535) BP: (105-117)/(60-73) 105/71 mmHg (03/30 0535) SpO2:  [98 %-100 %] 98 % (03/30 0535) Last BM Date: 01/23/13  Intake/Output from previous day: 03/29 0701 - 03/30 0700 In: 2816.3 [P.O.:1630; I.V.:1186.3] Out: -  Intake/Output this shift:    PE: Gen:  Alert, NAD, pleasant Lungs; CTA bil Heart: RRR Abd: Soft, no pain with deep palpation in RLQ, ND, +BS   Lab Results:   Recent Labs  01/23/13 0617  WBC 11.8*  HGB 13.5  HCT 39.7  PLT 198   BMET  Recent Labs  01/23/13 0617  NA 141  K 4.5  CL 105  CO2 25  GLUCOSE 104*  BUN 10  CREATININE 0.90  CALCIUM 8.7   PT/INR No results found for this basename: LABPROT, INR,  in the last 72 hours CMP     Component Value Date/Time   NA 141 01/23/2013 0617   K 4.5 01/23/2013 0617   CL 105 01/23/2013 0617   CO2 25 01/23/2013 0617   GLUCOSE 104* 01/23/2013 0617   BUN 10 01/23/2013 0617   CREATININE 0.90 01/23/2013 0617   CREATININE 0.93 02/27/2012 0929   CALCIUM 8.7 01/23/2013 0617   PROT 7.2 01/22/2013 0603   ALBUMIN 3.7 01/22/2013 0603   AST 26 01/22/2013 0603   ALT 24 01/22/2013 0603   ALKPHOS 71 01/22/2013 0603   BILITOT 0.7 01/22/2013 0603   GFRNONAA 86* 01/23/2013 0617   GFRAA >90 01/23/2013 0617   Lipase     Component Value Date/Time   LIPASE 45 01/22/2013 0603       Studies/Results: No results found.  Anti-infectives: Anti-infectives   Start     Dose/Rate Route Frequency Ordered Stop   01/22/13 1030  ertapenem (INVANZ) 1 g in sodium chloride 0.9 % 50 mL IVPB     1 g 100 mL/hr over 30 Minutes Intravenous Every 24 hours 01/22/13 1025         Assessment/Plan Acute  cecitis/typhlitis - symptoms resolved now, will advance diet to regular, switch to PO today.  If does well then could discharge home Monday.  SCDs, lovenox, add Tylenol and oxycodone for less severe pain  GI following - will f/u with Dr. Leone Payor in the office as an OP  If he acutely worsens may need right hemicolectomy or ileocecectomy   LOS: 3 days    WHITE, ELIZABETH 01/25/2013, 7:58 AM

## 2013-01-25 NOTE — Progress Notes (Signed)
Agree with A&P of EW,PA. He is clearly improving and plan home tomorrow unless situation changes.

## 2013-01-26 DIAGNOSIS — K37 Unspecified appendicitis: Secondary | ICD-10-CM | POA: Diagnosis present

## 2013-01-26 DIAGNOSIS — R1031 Right lower quadrant pain: Secondary | ICD-10-CM | POA: Diagnosis not present

## 2013-01-26 DIAGNOSIS — K3580 Unspecified acute appendicitis: Secondary | ICD-10-CM | POA: Diagnosis not present

## 2013-01-26 MED ORDER — AMOXICILLIN-POT CLAVULANATE 875-125 MG PO TABS: 1.0000 | ORAL_TABLET | Freq: Two times a day (BID) | ORAL | Status: DC

## 2013-01-26 MED ORDER — TRAMADOL HCL 50 MG PO TABS: 50.0000 mg | ORAL_TABLET | Freq: Four times a day (QID) | ORAL | Status: DC | PRN

## 2013-01-26 NOTE — Progress Notes (Signed)
Patient discharged to home with instructions. 

## 2013-01-26 NOTE — Discharge Summary (Signed)
Physician Discharge Summary  Patient ID: Jared [name] MRN: [id number] DOB/AGE: 68/26/1947 68 y.o.  Admit date: 01/22/2013 Discharge date: 01/26/2013  Admitting Diagnosis: Typhlitis RLQ abdominal pain B/L Inguinal hernias  Discharge Diagnosis Patient Active Problem List   Diagnosis Date Noted  . Typhlitis 01/26/2013  . Hernia 09/10/2012  . DJD (degenerative joint disease) 02/21/2011  . Screening for cholesterol level 02/13/2011  . OSTEOPENIA 03/24/2009  . GERD 02/22/2009  . PROSTATE CANCER 12/26/2006    Consultants GI (Dr. Marina Goodell), was seen by Dr. Leone Payor in the past for colonoscopy  Imaging: No results found.  Procedures None  Hospital Course:  67 yr old male who presents to the [facility] with 48hrs of worsening abdominal pain. The pain is isolated to the right lower quadrant. The patient does have inguinal hernias and thought at first it was this. However normally he can massage the area and it improves. This pain continued to worsen and was high up then his groin. He began to have a poor appetite and the pain became severe and sharp. He denies fevers or chills. He denies recent sick contacts. He had a normal BM yesterday. Denies n/v, constipation, diarrhea or blood in stool.   Workup showed likely acute cecitis/typhlitis and leukocytosis.  The patient was placed on bowel rest and Invanz antibiotic.  GI was (Dr. Marina Goodell) consulted and agreed with our recommendations and wanted to see the patient as an outpatient.  Patient was admitted for observation, IV antibiotic therapy, and pain control.  Diet was advanced as tolerated.  On HD #5, the patient was voiding well, tolerating diet, ambulating well, pain well controlled, vital signs stable, and felt stable for discharge home.  Patient will follow up in our office as needed and knows to call with questions or concerns.  He will f/u with Dr. Leone Payor in 2 weeks for OP f/u.  Physical Exam: General:  Alert, NAD, pleasant, comfortable Abd:   Soft, ND, mild tenderness    Medication List    TAKE these medications       amoxicillin-clavulanate 875-125 MG per tablet  Commonly known as:  AUGMENTIN  Take 1 tablet by mouth every 12 (twelve) hours.     aspirin 325 MG tablet  Take 325 mg by mouth daily.     B-100 COMPLEX Tabs  Take 1 tablet by mouth daily.     bimatoprost 0.01 % Soln  Commonly known as:  LUMIGAN  Place 1 drop into the left eye at bedtime.     brinzolamide 1 % ophthalmic suspension  Commonly known as:  AZOPT  Place 1 drop into the left eye 3 (three) times daily.     CALCIUM PO  Take 1 tablet by mouth 2 (two) times daily. 800mg      CENTRUM SILVER PO  Take 1 tablet by mouth daily.     Garlic 1000 MG Caps  Take 1 capsule by mouth daily.     traMADol 50 MG tablet  Commonly known as:  ULTRAM  Take 1 tablet (50 mg total) by mouth every 6 (six) hours as needed for pain.     triptorelin 11.25 MG injection  Commonly known as:  TRELESTAR LA  Inject 11.25 mg into the muscle every 3 (three) months. Take injections quarterly     vitamin C 500 MG tablet  Commonly known as:  ASCORBIC ACID  Take 500 mg by mouth daily.     Vitamin D-3 1000 UNITS Caps  Take 1 capsule by mouth daily.  Follow-up Information   Follow up with Stan Head, MD. Schedule an appointment as soon as possible for a visit in 2 weeks.   Contact information:   [address] [place] [address] [phone]       Call [facility]. (As needed)    Contact information:   [address]   [place] [address] [phone]       Signed: Candiss Norse [facility] [phone]  01/26/2013, 8:42 AM

## 2013-01-28 ENCOUNTER — Encounter: Payer: Self-pay | Admitting: Internal Medicine

## 2013-01-30 IMAGING — CT CT ABD-PELV W/ CM
2 of 5 series · 16 of 46 positions shown, 18 images · IV contrast (READICAT/WATER & [ID] OMNI 300)
Comparison: Abdominal MRI 06/20/2004.

CLINICAL DATA: Evaluate for right abdominal hernia.  History of
prostatectomy and renal calculi.

BUN and creatinine were obtained on site at [HOSPITAL] at
[HOSPITAL]..
Results:  BUN 10 mg/dL,  Creatinine 1.0 mg/dL.
CT ABDOMEN AND PELVIS WITH CONTRAST
TECHNIQUE: Multidetector CT imaging of the abdomen and pelvis was
performed following the standard protocol during bolus
administration of intravenous contrast.
Contrast: 125mL OMNIPAQUE IOHEXOL 300 MG/ML  SOLN

[Series 2: abd/pelvis with · axial · 0.90mm/px · z∈[-375,-10]mm · 13 of 83 slices shown, 15 images]
[im 5/83  soft-tissue]
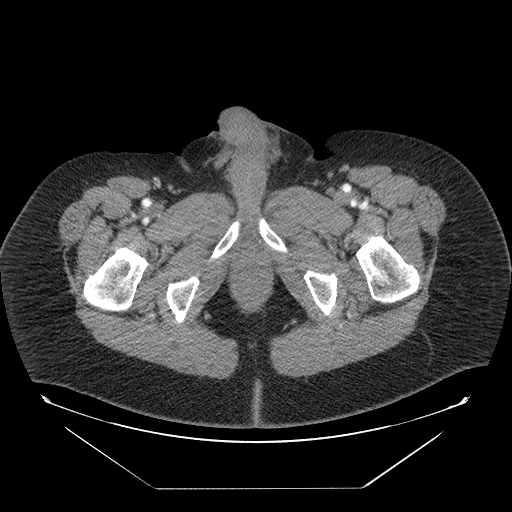
[im 5/83  bone]
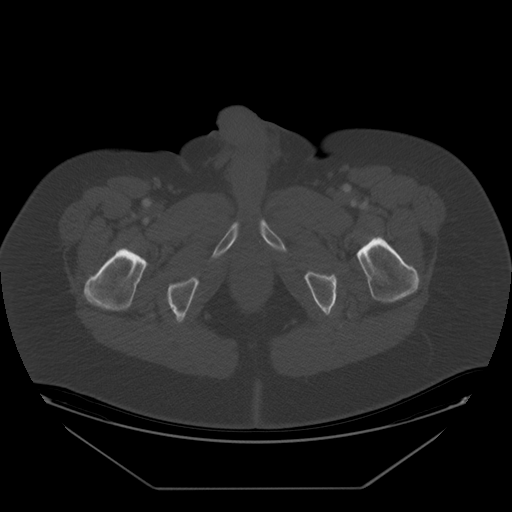
[im 13/83  soft-tissue]
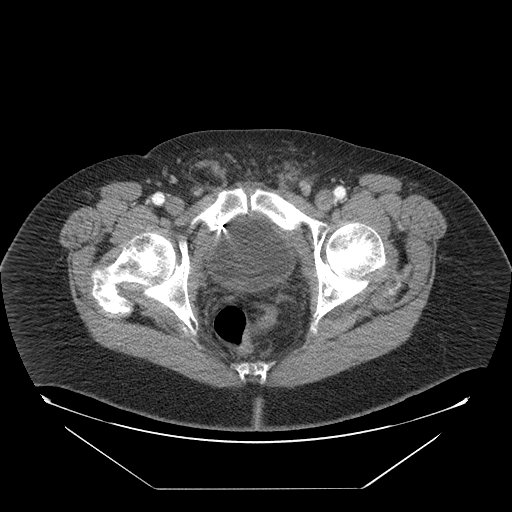
[im 18/83  soft-tissue]
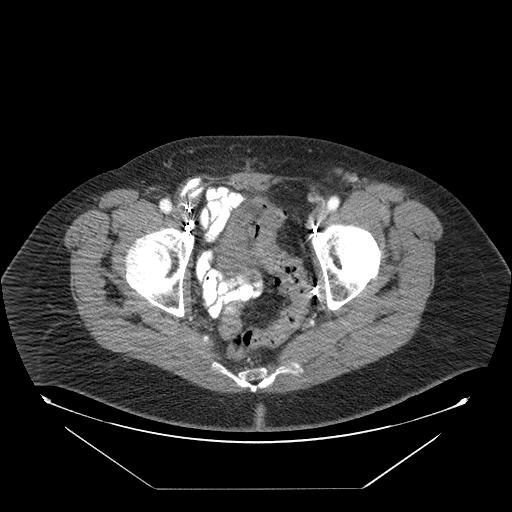
[im 22/83  soft-tissue]
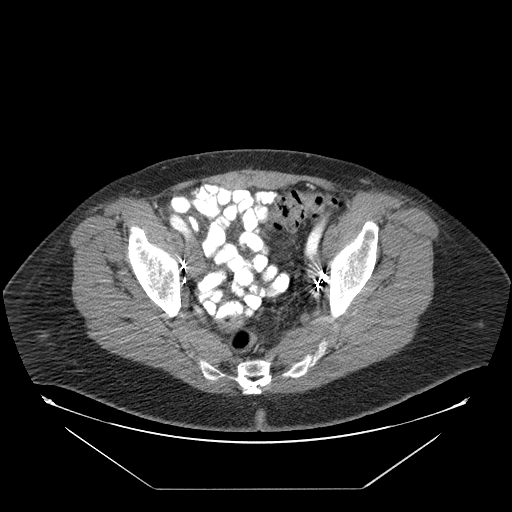
[im 31/83  soft-tissue]
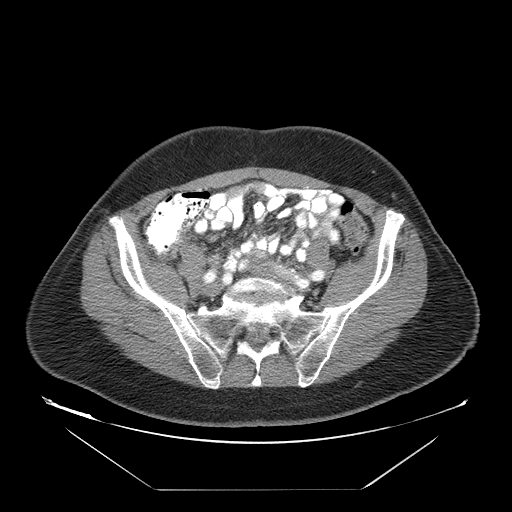
[im 35/83  soft-tissue]
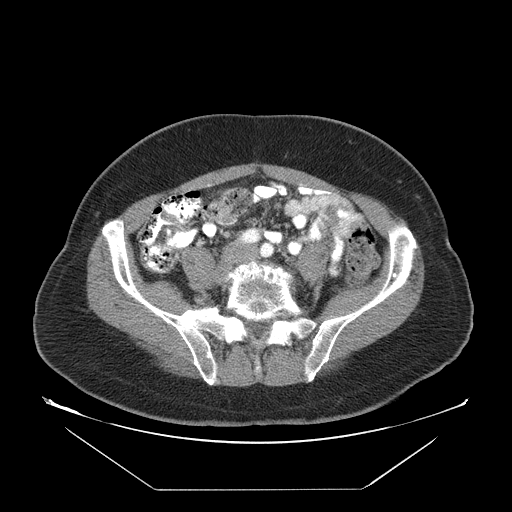
[im 44/83  soft-tissue]
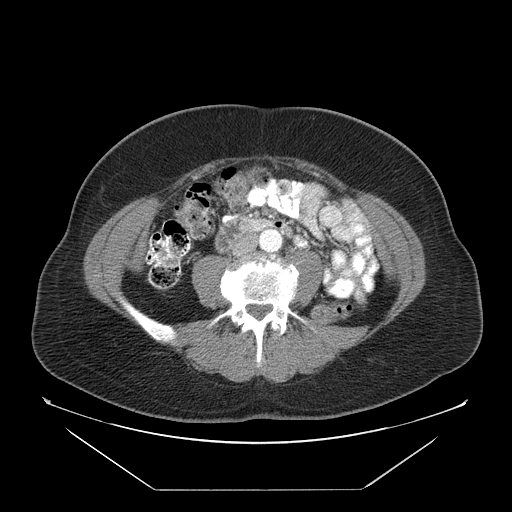
[im 48/83  soft-tissue]
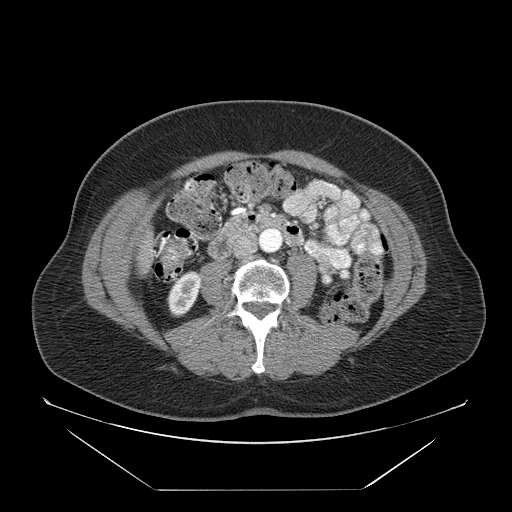
[im 52/83  soft-tissue]
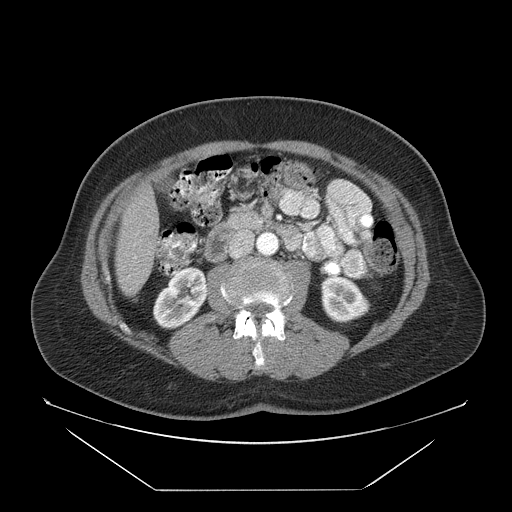
[im 52/83  bone]
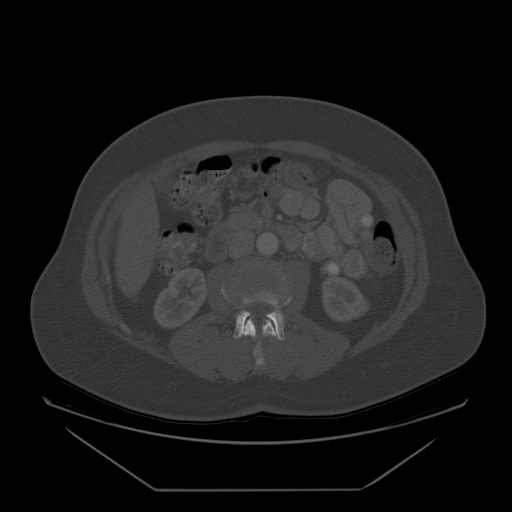
[im 61/83  soft-tissue]
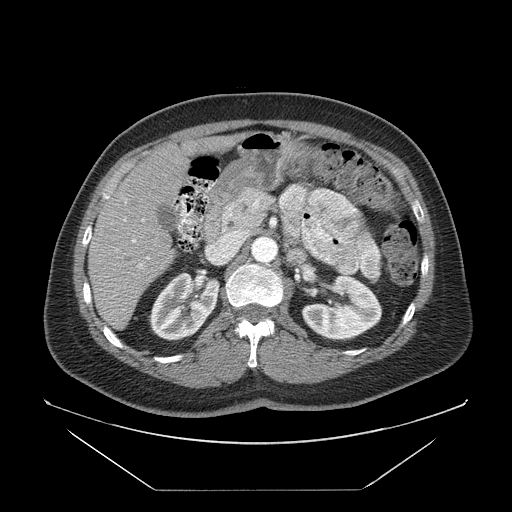
[im 65/83  soft-tissue]
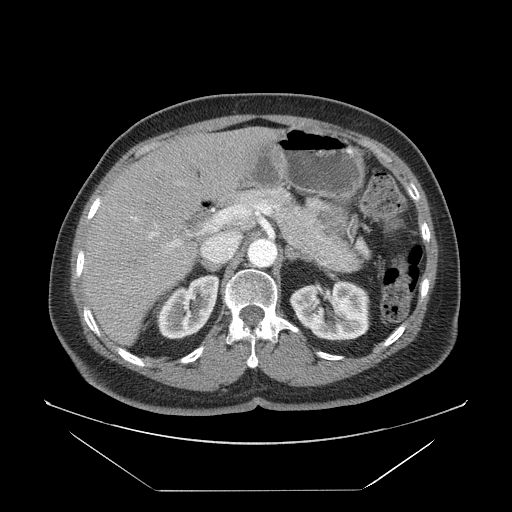
[im 70/83  soft-tissue]
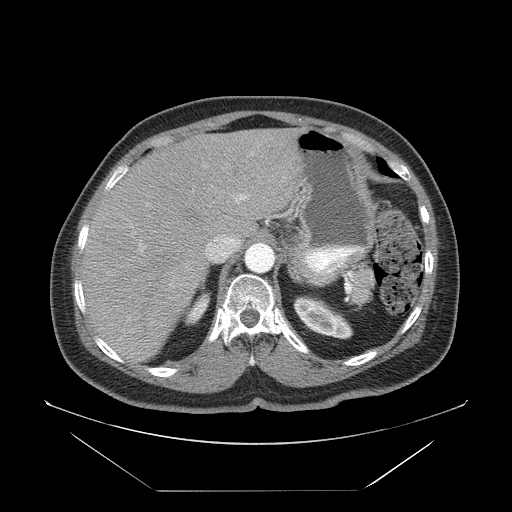
[im 78/83  soft-tissue]
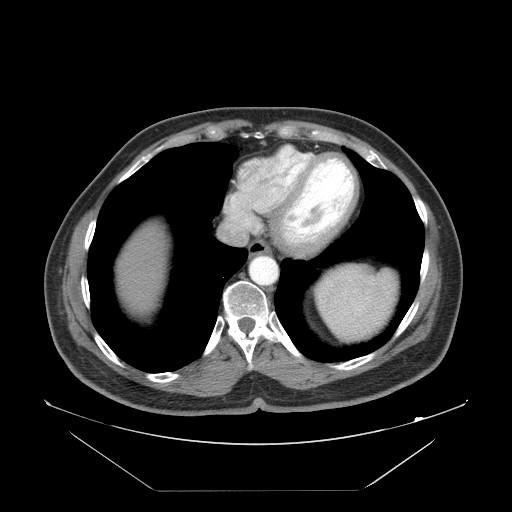

[Series 402: cor · coronal · 0.90mm/px · 3 of 123 slices shown]
[im 41/123  soft-tissue]
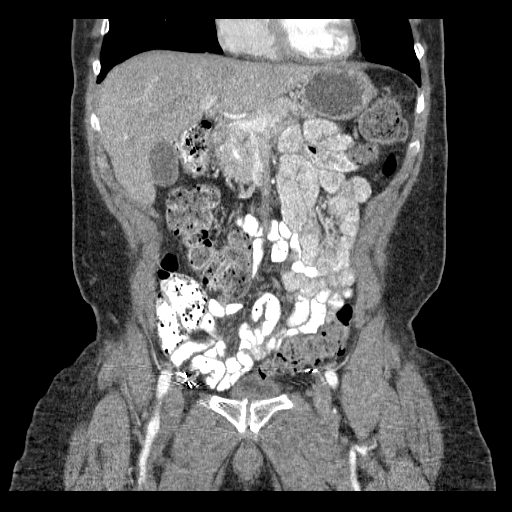
[im 55/123  soft-tissue]
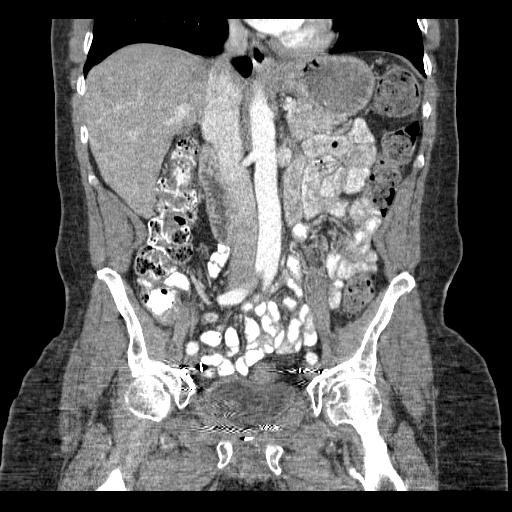
[im 68/123  soft-tissue]
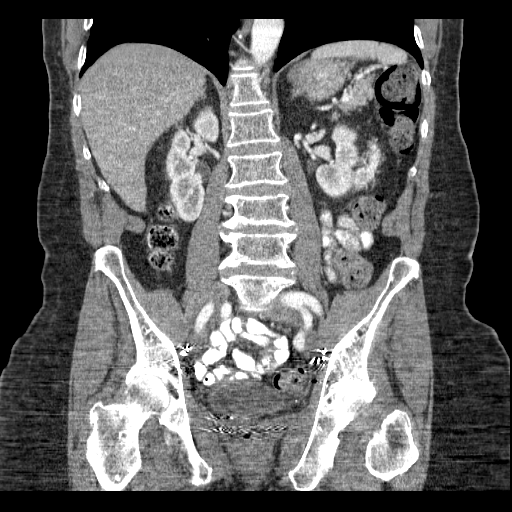

[16 of 46 positions shown; findings below may reference images not displayed]

FINDINGS: The lung bases are clear.  There is no pleural effusion.

The liver, gallbladder, biliary system and pancreas appear normal.
The spleen and right adrenal gland appear normal.  There is a 2.0 x
1.7 cm left adrenal nodule on image 22.  Although not well seen on
the prior MRI due to motion, this appears unchanged.  (A left
adrenal nodule was reported on a prior CT in 0118 - unavailable for
direct comparison).

An 8 mm low density lesion in the lower pole of the left kidney is
likely a cyst.  The right kidney appears normal.  There is no
hydronephrosis.

There is a right inguinal hernia containing fat.  Along the
superior aspect of this hernia, a small amount of herniated small
bowel is noted.  There is no evidence of incarceration or bowel
obstruction.  No other hernias are identified.

There are no enlarged abdominal pelvic lymph node status post
prostatectomy and pelvic lymphadenectomy.  No mass is identified
within the prostatectomy bed.  There is mild sigmoid colon
diverticular change.  The appendix appears normal.

Mild lumbar spondylosis is noted.  There is no evidence of osseous
metastatic disease.
IMPRESSION: 1.  Small right inguinal hernia containing fat and a knuckle of
small bowel.  No evidence of bowel incarceration or obstruction.
2.  No evidence metastatic prostate cancer.
3.  Stable small left adrenal nodule, likely an adenoma based on
stability.

## 2013-03-09 ENCOUNTER — Ambulatory Visit (INDEPENDENT_AMBULATORY_CARE_PROVIDER_SITE_OTHER): Payer: Medicare Other | Admitting: Internal Medicine

## 2013-03-09 ENCOUNTER — Encounter: Payer: Self-pay | Admitting: Internal Medicine

## 2013-03-09 VITALS — BP 132/74 | HR 68 | Ht 71.0 in | Wt 234.0 lb

## 2013-03-09 DIAGNOSIS — K37 Unspecified appendicitis: Secondary | ICD-10-CM | POA: Diagnosis not present

## 2013-03-09 DIAGNOSIS — R933 Abnormal findings on diagnostic imaging of other parts of digestive tract: Secondary | ICD-10-CM

## 2013-03-09 DIAGNOSIS — H409 Unspecified glaucoma: Secondary | ICD-10-CM | POA: Diagnosis not present

## 2013-03-09 DIAGNOSIS — H4011X0 Primary open-angle glaucoma, stage unspecified: Secondary | ICD-10-CM | POA: Diagnosis not present

## 2013-03-09 MED ORDER — NA SULFATE-K SULFATE-MG SULF 17.5-3.13-1.6 GM/177ML PO SOLN: ORAL | Status: DC

## 2013-03-09 NOTE — Progress Notes (Signed)
  Subjective:    Patient ID: Jared [name], male    DOB: Jan 20, 1946, 67 y.o.   MRN: [id number]  HPI The patient is here for followup after hospitalization. He had inflammation in the cecum seen on CT scan with associated right lower quadrant pain. He was treated with antibiotics for what was thought to be typhlitis. He is completely recovered. He last had a colonoscopy in 2005 for screening that was essentially unremarkable.  Medications, allergies, past medical history, past surgical history, family history and social history are reviewed and updated in the EMR.  Review of Systems As per history of present illness. He does have some joint pains.    Objective:   Physical Exam General:  NAD Lungs:  Clear ant Heart:  S1S2 no rubs, murmurs or gallops Abdomen:  soft and nontender, BS+   Data Reviewed:   Hospital notes 12/2012 2005 colonoscopy CT scan reports 2013 and 12/2012     Assessment & Plan:  Abnormal computed tomography of cecum  Typhlitis-resolved  1. Schedule colonoscopy to evaluate cecum for underlying pathology that might have triggered this event of typhlitis The risks and benefits as well as alternatives of endoscopic procedure(s) have been discussed and reviewed. All questions answered. The patient agrees to proceed.

## 2013-03-09 NOTE — Patient Instructions (Addendum)
You have been scheduled for a colonoscopy with propofol. Please follow written instructions given to you at your visit today.  Please pick up your prep kit at the pharmacy within the next 1-3 days. If you use inhalers (even only as needed), please bring them with you on the day of your procedure. Your physician has requested that you go to www.startemmi.com and enter the access code given to you at your visit today. This web site gives a general overview about your procedure. However, you should still follow specific instructions given to you by our office regarding your preparation for the procedure.   Thank you for choosing me and [facility].  Iva Boop, M.D., [facility]

## 2013-03-11 ENCOUNTER — Encounter: Payer: Self-pay | Admitting: Internal Medicine

## 2013-03-16 ENCOUNTER — Ambulatory Visit (AMBULATORY_SURGERY_CENTER): Payer: Medicare Other | Admitting: Internal Medicine

## 2013-03-16 ENCOUNTER — Encounter: Payer: Self-pay | Admitting: Internal Medicine

## 2013-03-16 VITALS — BP 106/69 | HR 54 | Temp 96.6°F | Resp 17 | Ht 71.0 in | Wt 234.0 lb

## 2013-03-16 DIAGNOSIS — R933 Abnormal findings on diagnostic imaging of other parts of digestive tract: Secondary | ICD-10-CM | POA: Diagnosis not present

## 2013-03-16 DIAGNOSIS — F959 Tic disorder, unspecified: Secondary | ICD-10-CM | POA: Diagnosis not present

## 2013-03-16 DIAGNOSIS — K5730 Diverticulosis of large intestine without perforation or abscess without bleeding: Secondary | ICD-10-CM | POA: Diagnosis not present

## 2013-03-16 MED ORDER — SODIUM CHLORIDE 0.9 % IV SOLN: 500.0000 mL | INTRAVENOUS | Status: DC

## 2013-03-16 NOTE — Progress Notes (Signed)
NO EGG OR SOY ALLERGY. EMW 

## 2013-03-16 NOTE — Progress Notes (Signed)
Patient did not experience any of the following events: a burn prior to discharge; a fall within the facility; wrong site/side/patient/procedure/implant event; or a hospital transfer or hospital admission upon discharge from the facility. (G8907) Patient did not have preoperative order for IV antibiotic SSI prophylaxis. (G8918)  

## 2013-03-16 NOTE — Op Note (Signed)
[facility] [address] [place], [address]   COLONOSCOPY PROCEDURE REPORT  PATIENT: Jared, [name]  MR#: [id number] BIRTHDATE: Jul 10, 1946 , 66  yrs. old GENDER: Male ENDOSCOPIST: Iva Boop, MD, [facility] PROCEDURE DATE:  03/16/2013 PROCEDURE:   Colonoscopy, diagnostic ASA CLASS:   Class II INDICATIONS:an abnormal CT. MEDICATIONS: propofol (Diprivan) 200mg  IV, These medications were titrated to patient response per physician's verbal order, and MAC sedation, administered by CRNA  DESCRIPTION OF PROCEDURE:   After the risks benefits and alternatives of the procedure were thoroughly explained, informed consent was obtained.  A digital rectal exam revealed no abnormalities of the rectum, A digital rectal exam revealed no prostatic nodules, and A digital rectal exam revealed the prostate was not enlarged.   The LB [id number]  endoscope was introduced through the anus and advanced to the cecum, which was identified by both the appendix and ileocecal valve. No adverse events experienced.   The quality of the prep was excellent using Suprep  The instrument was then slowly withdrawn as the colon was fully examined.      COLON FINDINGS: Moderate diverticulosis was noted The finding was in the left colon.   The colon mucosa was otherwise normal. Retroflexed views revealed no abnormalities. The time to cecum=3 minutes 13 seconds.  Withdrawal time=8 minutes 08 seconds.  The scope was withdrawn and the procedure completed. COMPLICATIONS: There were no complications.  ENDOSCOPIC IMPRESSION: 1.   Moderate diverticulosis was noted in the left colon 2.   The colon mucosa was otherwise normal - excellent prep  RECOMMENDATIONS: Repeat routine Colonscopy in 10 years - 2024   eSigned:  Iva Boop, MD, [facility] 03/16/2013 4:31 PM   cc: The Patient and Oda Cogan MD

## 2013-03-16 NOTE — Patient Instructions (Addendum)
You have diverticulosis - common condition that is  not usually a problem.  Next routine repeat colonoscopy in 10 years - 2024.  I appreciate the opportunity to care for you.  Iva Boop, MD, FACG  YOU HAD AN ENDOSCOPIC PROCEDURE TODAY AT THE [facility]: Refer to the procedure report that was given to you for any specific questions about what was found during the examination.  If the procedure report does not answer your questions, please call your gastroenterologist to clarify.  If you requested that your care partner not be given the details of your procedure findings, then the procedure report has been included in a sealed envelope for you to review at your convenience later.  YOU SHOULD EXPECT: Some feelings of bloating in the abdomen. Passage of more gas than usual.  Walking can help get rid of the air that was put into your GI tract during the procedure and reduce the bloating. If you had a lower endoscopy (such as a colonoscopy or flexible sigmoidoscopy) you may notice spotting of blood in your stool or on the toilet paper. If you underwent a bowel prep for your procedure, then you may not have a normal bowel movement for a few days.  DIET: Your first meal following the procedure should be a light meal and then it is ok to progress to your normal diet.  A half-sandwich or bowl of soup is an example of a good first meal.  Heavy or fried foods are harder to digest and may make you feel nauseous or bloated.  Likewise meals heavy in dairy and vegetables can cause extra gas to form and this can also increase the bloating.  Drink plenty of fluids but you should avoid alcoholic beverages for 24 hours.  ACTIVITY: Your care partner should take you home directly after the procedure.  You should plan to take it easy, moving slowly for the rest of the day.  You can resume normal activity the day after the procedure however you should NOT DRIVE or use heavy machinery for 24 hours (because  of the sedation medicines used during the test).    SYMPTOMS TO REPORT IMMEDIATELY: A gastroenterologist can be reached at any hour.  During normal business hours, 8:30 AM to 5:00 PM Monday through Friday, call [phone].  After hours and on weekends, please call the GI answering service at [phone] who will take a message and have the physician on call contact you.   Following lower endoscopy (colonoscopy or flexible sigmoidoscopy):  Excessive amounts of blood in the stool  Significant tenderness or worsening of abdominal pains  Swelling of the abdomen that is new, acute  Fever of 100F or higher  FOLLOW UP: If any biopsies were taken you will be contacted by phone or by letter within the next 1-3 weeks.  Call your gastroenterologist if you have not heard about the biopsies in 3 weeks.  Our staff will call the home number listed on your records the next business day following your procedure to check on you and address any questions or concerns that you may have at that time regarding the information given to you following your procedure. This is a courtesy call and so if there is no answer at the home number and we have not heard from you through the emergency physician on call, we will assume that you have returned to your regular daily activities without incident.  SIGNATURES/CONFIDENTIALITY: You and/or your care partner have signed paperwork which  will be entered into your electronic medical record.  These signatures attest to the fact that that the information above on your After Visit Summary has been reviewed and is understood.  Full responsibility of the confidentiality of this discharge information lies with you and/or your care-partner.  Diverticulosis-handout given  Repeat colonoscopy in 10 years

## 2013-03-17 ENCOUNTER — Telehealth: Payer: Self-pay

## 2013-03-17 NOTE — Telephone Encounter (Signed)
  Follow up Call-  Call back number 03/16/2013  Post procedure Call Back phone  # [phone]  Permission to leave phone message Yes     Patient questions:  Do you have a fever, pain , or abdominal swelling? no Pain Score  0 *  Have you tolerated food without any problems? yes  Have you been able to return to your normal activities? yes  Do you have any questions about your discharge instructions: Diet   no Medications  no Follow up visit  no  Do you have questions or concerns about your Care? no  Actions: * If pain score is 4 or above: No action needed, pain <4.

## 2013-03-20 ENCOUNTER — Emergency Department (HOSPITAL_COMMUNITY)
Admission: EM | Admit: 2013-03-20 | Discharge: 2013-03-21 | Disposition: A | Discharge status: Home / Self Care | Payer: Medicare Other | Attending: Emergency Medicine | Admitting: Emergency Medicine

## 2013-03-20 ENCOUNTER — Encounter (HOSPITAL_COMMUNITY): Payer: Self-pay | Admitting: Emergency Medicine

## 2013-03-20 DIAGNOSIS — Z8546 Personal history of malignant neoplasm of prostate: Secondary | ICD-10-CM | POA: Insufficient documentation

## 2013-03-20 DIAGNOSIS — Z79899 Other long term (current) drug therapy: Secondary | ICD-10-CM | POA: Diagnosis not present

## 2013-03-20 DIAGNOSIS — M129 Arthropathy, unspecified: Secondary | ICD-10-CM | POA: Insufficient documentation

## 2013-03-20 DIAGNOSIS — N189 Chronic kidney disease, unspecified: Secondary | ICD-10-CM | POA: Insufficient documentation

## 2013-03-20 DIAGNOSIS — R112 Nausea with vomiting, unspecified: Secondary | ICD-10-CM | POA: Diagnosis not present

## 2013-03-20 DIAGNOSIS — R109 Unspecified abdominal pain: Secondary | ICD-10-CM | POA: Diagnosis not present

## 2013-03-20 DIAGNOSIS — R1013 Epigastric pain: Secondary | ICD-10-CM

## 2013-03-20 DIAGNOSIS — Z87891 Personal history of nicotine dependence: Secondary | ICD-10-CM | POA: Diagnosis not present

## 2013-03-20 DIAGNOSIS — Z7982 Long term (current) use of aspirin: Secondary | ICD-10-CM | POA: Insufficient documentation

## 2013-03-20 DIAGNOSIS — Z888 Allergy status to other drugs, medicaments and biological substances status: Secondary | ICD-10-CM | POA: Diagnosis not present

## 2013-03-20 DIAGNOSIS — Z8719 Personal history of other diseases of the digestive system: Secondary | ICD-10-CM | POA: Diagnosis not present

## 2013-03-20 LAB — CBC WITH DIFFERENTIAL/PLATELET
Eosinophils Absolute: 0 10*3/uL (ref 0.0–0.7)
Lymphocytes Relative: 9 % — ABNORMAL LOW (ref 12–46)
Lymphs Abs: 0.6 10*3/uL — ABNORMAL LOW (ref 0.7–4.0)
MCH: 29.1 pg (ref 26.0–34.0)
Neutro Abs: 5.9 10*3/uL (ref 1.7–7.7)
Neutrophils Relative %: 86 % — ABNORMAL HIGH (ref 43–77)
Platelets: 222 10*3/uL (ref 150–400)
RBC: 5.08 MIL/uL (ref 4.22–5.81)
WBC: 6.9 10*3/uL (ref 4.0–10.5)

## 2013-03-20 LAB — COMPREHENSIVE METABOLIC PANEL
ALT: 16 U/L (ref 0–53)
Alkaline Phosphatase: 62 U/L (ref 39–117)
GFR calc Af Amer: >90 mL/min (ref >90)
Glucose, Bld: 134 mg/dL — ABNORMAL HIGH (ref 70–99)
Potassium: 3.7 mEq/L (ref 3.5–5.1)
Sodium: 140 mEq/L (ref 135–145)
Total Protein: 7.1 g/dL (ref 6.0–8.3)

## 2013-03-20 LAB — POCT I-STAT TROPONIN I: Troponin i, poc: 0.01 ng/mL (ref 0.00–0.08)

## 2013-03-20 LAB — URINALYSIS, ROUTINE W REFLEX MICROSCOPIC
Hgb urine dipstick: NEGATIVE
Specific Gravity, Urine: 1.009 (ref 1.005–1.030)
Urobilinogen, UA: 0.2 mg/dL (ref 0.0–1.0)
pH: 6.5 (ref 5.0–8.0)

## 2013-03-20 LAB — BILIRUBIN, DIRECT: Bilirubin, Direct: 0.1 mg/dL (ref 0.0–0.3)

## 2013-03-20 NOTE — ED Notes (Signed)
MD Miller at bedside. 

## 2013-03-20 NOTE — ED Provider Notes (Signed)
History     CSN: [id number]  Arrival date & time 03/20/13  2017   First MD Initiated Contact with Patient 03/20/13 2140      Chief Complaint  Patient presents with  . Abdominal Pain    (Consider location/radiation/quality/duration/timing/severity/associated sxs/prior treatment) HPI Comments: 67 y/o male with hx of chronic kidney disease, and hypercholesterolemia - no hx of htn, dm, tob use (in 20+ years).  He has gradual onset of mid abd pain at 4 PM - is constant, gradually improving, was associated with n/v X 1 but no SOB, no CP, no radiation - states he was gardening without difficutly - no hx of cad.  Sx are now 1/10, mild.  No hx of CAD.  Patient is a 67 y.o. male presenting with abdominal pain. The history is provided by the patient.  Abdominal Pain Associated symptoms include abdominal pain.    Past Medical History  Diagnosis Date  . Chronic kidney disease   . Inguinal hernia     right  . Prostate cancer 1990  . Arthritis     SHOULDER & KNEES    Past Surgical History  Procedure Laterality Date  . Prostatectomy    . Tonsillectomy    . Eye surgery    . Cataract extraction    . Colonoscopy  01/07/2004    Dr. Stan Head    Family History  Problem Relation Age of Onset  . Prostate cancer Father   . Pancreatic cancer Mother   . Colon cancer Neg Hx     History  Substance Use Topics  . Smoking status: Former Smoker    Quit date: 10/29/1981  . Smokeless tobacco: Never Used  . Alcohol Use: No      Review of Systems  Gastrointestinal: Positive for abdominal pain.  All other systems reviewed and are negative.    Allergies  Claritin and Milk-related compounds  Home Medications   Current Outpatient Rx  Name  Route  Sig  Dispense  Refill  . Ascorbic Acid (VITAMIN C) 500 MG tablet   Oral   Take 500 mg by mouth daily.           Marland Kitchen aspirin 325 MG tablet   Oral   Take 325 mg by mouth daily.           . bimatoprost (LUMIGAN) 0.01 % SOLN   Left  Eye   Place 1 drop into the left eye at bedtime.         . brinzolamide (AZOPT) 1 % ophthalmic suspension   Left Eye   Place 1 drop into the left eye 3 (three) times daily.         Marland Kitchen CALCIUM PO   Oral   Take 1 tablet by mouth 2 (two) times daily. 800mg          . Cholecalciferol (VITAMIN D-3) 1000 UNITS CAPS   Oral   Take 1 capsule by mouth daily.         . Garlic 1000 MG CAPS   Oral   Take 1 capsule by mouth daily.           . Multiple Vitamins-Minerals (CENTRUM SILVER PO)   Oral   Take 1 tablet by mouth daily.           . timolol (TIMOPTIC) 0.5 % ophthalmic solution   Left Eye   Place 1 drop into the left eye daily.          . traMADol (ULTRAM) 50 MG  tablet   Oral   Take 50 mg by mouth every 6 (six) hours as needed for pain.         . Triptorelin Pamoate 11.25 MG SUSR   Intramuscular   Inject 11.25 mg into the muscle every 3 (three) months. Take injections quarterly         . Vitamins-Lipotropics (B-100 COMPLEX) TABS   Oral   Take 1 tablet by mouth daily.             BP 120/76  Pulse 62  Temp(Src) 98.3 F (36.8 C) (Oral)  Resp 16  SpO2 99%  Physical Exam  Nursing note and vitals reviewed. Constitutional: He appears well-developed and well-nourished. No distress.  HENT:  Head: Normocephalic and atraumatic.  Mouth/Throat: Oropharynx is clear and moist. No oropharyngeal exudate.  Eyes: Conjunctivae and EOM are normal. Pupils are equal, round, and reactive to light. Right eye exhibits no discharge. Left eye exhibits no discharge. No scleral icterus.  Neck: Normal range of motion. Neck supple. No JVD present. No thyromegaly present.  Cardiovascular: Normal rate, regular rhythm, normal heart sounds and intact distal pulses.  Exam reveals no gallop and no friction rub.   No murmur heard. Pulmonary/Chest: Effort normal and breath sounds normal. No respiratory distress. He has no wheezes. He has no rales.  Abdominal: Soft. Bowel sounds are  normal. He exhibits no distension and no mass. There is no tenderness.  No abd ttp.  Musculoskeletal: Normal range of motion. He exhibits no edema and no tenderness.  Lymphadenopathy:    He has no cervical adenopathy.  Neurological: He is alert. Coordination normal.  Skin: Skin is warm and dry. No rash noted. No erythema.  Psychiatric: He has a normal mood and affect. His behavior is normal.    ED Course  Procedures (including critical care time)  Labs Reviewed  CBC WITH DIFFERENTIAL  COMPREHENSIVE METABOLIC PANEL  LIPASE, BLOOD  URINALYSIS, ROUTINE W REFLEX MICROSCOPIC  HEPATIC FUNCTION PANEL   No results found.   No diagnosis found.    MDM  Pt appears well, VS normal, ECG with abn ST segments in non specific pattern.  Labs ordered including troponin- will d/w cardiology re: ECG. Sx minimal at this time, no sig pain.  Non surgical abd at this time.  No diaphoresis.  No hypoxia.  ED ECG REPORT  I personally interpreted this EKG   Date: 03/20/2013   Rate: 63  Rhythm: normal sinus rhythm  QRS Axis: normal  Intervals: normal  ST/T Wave abnormalities: nonspecific ST/T changes  Conduction Disutrbances:none  Narrative Interpretation:   Old EKG Reviewed: none available  The patient states that he had his last stress test 8 years ago and it was completely normal. His initial troponin is normal, EKG is nonischemic and I discussed with the patient the need for repeat testing at 2 hours. He has been chest pain-free and abdominal pain free since arrival, repeat EKG ordered,  Trop at midnight - would be able to go home if negative troponin - I have discussed with the pt that he needs close follow up with the cardiologist for ongoing treatment and evaluation if sx recur.  His lipase is mildly elevated but normal WBC and LFT's.  Change of shift - care signed out to Dr. Trenton Gammon, MD 03/20/13 2322

## 2013-03-20 NOTE — ED Notes (Signed)
PT. REPORTS MID ABDOMINAL PAIN WITH EMESIS ( X1) ONSET 4 PM THIS AFTERNOON , DENIES DIARRHEA /FEVER OR CHILLS. PT. STATED COLONOSCOPY LAST MAY 19  , 2014 WITH NO FINDINGS. DENEIS CHEST PAIN OR SOB.

## 2013-04-08 DIAGNOSIS — H4011X0 Primary open-angle glaucoma, stage unspecified: Secondary | ICD-10-CM | POA: Diagnosis not present

## 2013-04-08 DIAGNOSIS — H409 Unspecified glaucoma: Secondary | ICD-10-CM | POA: Diagnosis not present

## 2013-04-13 DIAGNOSIS — C61 Malignant neoplasm of prostate: Secondary | ICD-10-CM | POA: Diagnosis not present

## 2013-04-24 ENCOUNTER — Encounter: Payer: Self-pay | Admitting: Family Medicine

## 2013-04-27 DIAGNOSIS — N200 Calculus of kidney: Secondary | ICD-10-CM | POA: Diagnosis not present

## 2013-04-27 DIAGNOSIS — C61 Malignant neoplasm of prostate: Secondary | ICD-10-CM | POA: Diagnosis not present

## 2013-04-28 ENCOUNTER — Encounter: Payer: Self-pay | Admitting: Family Medicine

## 2013-06-03 DIAGNOSIS — C61 Malignant neoplasm of prostate: Secondary | ICD-10-CM | POA: Diagnosis not present

## 2013-06-21 ENCOUNTER — Emergency Department (INDEPENDENT_AMBULATORY_CARE_PROVIDER_SITE_OTHER): Payer: Medicare Other

## 2013-06-21 ENCOUNTER — Encounter (HOSPITAL_COMMUNITY): Payer: Self-pay | Admitting: Emergency Medicine

## 2013-06-21 ENCOUNTER — Emergency Department (INDEPENDENT_AMBULATORY_CARE_PROVIDER_SITE_OTHER)
Admission: EM | Admit: 2013-06-21 | Discharge: 2013-06-21 | Disposition: A | Discharge status: Home / Self Care | Payer: Medicare Other | Source: Home / Self Care | Attending: Emergency Medicine | Admitting: Emergency Medicine

## 2013-06-21 DIAGNOSIS — N200 Calculus of kidney: Secondary | ICD-10-CM | POA: Diagnosis not present

## 2013-06-21 DIAGNOSIS — R319 Hematuria, unspecified: Secondary | ICD-10-CM

## 2013-06-21 LAB — POCT URINALYSIS DIP (DEVICE)
Glucose, UA: NEGATIVE mg/dL
Leukocytes, UA: NEGATIVE
Nitrite: NEGATIVE
Urobilinogen, UA: 1 mg/dL (ref 0.0–1.0)
pH: 6 (ref 5.0–8.0)

## 2013-06-21 MED ORDER — HYDROCODONE-ACETAMINOPHEN 5-325 MG PO TABS: 2.0000 | ORAL_TABLET | ORAL | Status: DC | PRN

## 2013-06-21 NOTE — ED Provider Notes (Signed)
Medical screening examination/treatment/procedure(s) were performed by non-physician practitioner and as supervising physician I was immediately available for consultation/collaboration.  Leslee Home, M.D.  Reuben Likes, MD 06/21/13 [phone]

## 2013-06-21 NOTE — ED Provider Notes (Signed)
CSN: [id number]     Arrival date & time 06/21/13  1119 History     None    Chief Complaint  Patient presents with  . Pelvic Pain   (Consider location/radiation/quality/duration/timing/severity/associated sxs/prior Treatment) Patient is a 67 y.o. male presenting with pelvic pain. The history is provided by the patient. No language interpreter was used.  Pelvic Pain This is a new problem. Episode onset: 5 days. The problem occurs constantly. The problem has been gradually improving. Associated symptoms include abdominal pain. Nothing aggravates the symptoms. He has tried nothing for the symptoms. The treatment provided mild relief.   Pt reports he began having back pain on Wednesday that felt like a kidney stone.   Pt reports back pain has resolved.   Pt complains of continued soreness lower abdomen.   Pt has had issues with abdominal painfor the past 5 months.   Pt treated for March.   Pt has been seen by surgery and Gi.   Past Medical History  Diagnosis Date  . Chronic kidney disease   . Inguinal hernia     right  . Prostate cancer 1990  . Arthritis     SHOULDER & KNEES   Past Surgical History  Procedure Laterality Date  . Prostatectomy    . Tonsillectomy    . Eye surgery    . Cataract extraction    . Colonoscopy  01/07/2004    Dr. Stan Head   Family History  Problem Relation Age of Onset  . Prostate cancer Father   . Pancreatic cancer Mother   . Colon cancer Neg Hx    History  Substance Use Topics  . Smoking status: Former Smoker    Quit date: 10/29/1981  . Smokeless tobacco: Never Used  . Alcohol Use: No    Review of Systems  Gastrointestinal: Positive for nausea and abdominal pain. Negative for vomiting and blood in stool.  Genitourinary: Positive for pelvic pain.  All other systems reviewed and are negative.    Allergies  Claritin and Milk-related compounds  Home Medications   Current Outpatient Rx  Name  Route  Sig  Dispense  Refill  . Ascorbic  Acid (VITAMIN C) 500 MG tablet   Oral   Take 500 mg by mouth daily.           Marland Kitchen aspirin 325 MG tablet   Oral   Take 325 mg by mouth daily.           . bimatoprost (LUMIGAN) 0.01 % SOLN   Left Eye   Place 1 drop into the left eye at bedtime.         . brinzolamide (AZOPT) 1 % ophthalmic suspension   Left Eye   Place 1 drop into the left eye 3 (three) times daily.         Marland Kitchen CALCIUM PO   Oral   Take 1 tablet by mouth 2 (two) times daily. 800mg          . Cholecalciferol (VITAMIN D-3) 1000 UNITS CAPS   Oral   Take 1 capsule by mouth daily.         . Garlic 1000 MG CAPS   Oral   Take 1 capsule by mouth daily.           . Multiple Vitamins-Minerals (CENTRUM SILVER PO)   Oral   Take 1 tablet by mouth daily.           . timolol (TIMOPTIC) 0.5 % ophthalmic solution   Left  Eye   Place 1 drop into the left eye daily.          . traMADol (ULTRAM) 50 MG tablet   Oral   Take 50 mg by mouth every 6 (six) hours as needed for pain.         . Triptorelin Pamoate 11.25 MG SUSR   Intramuscular   Inject 11.25 mg into the muscle every 3 (three) months. Take injections quarterly         . Vitamins-Lipotropics (B-100 COMPLEX) TABS   Oral   Take 1 tablet by mouth daily.            BP 175/73  Pulse 65  Temp(Src) 99.2 F (37.3 C) (Oral)  Resp 20  SpO2 96% Physical Exam  Nursing note and vitals reviewed. Constitutional: He appears well-developed and well-nourished.  HENT:  Head: Normocephalic.  Eyes: Pupils are equal, round, and reactive to light.  Neck: Normal range of motion.  Cardiovascular: Normal rate.   Pulmonary/Chest: Effort normal.  Abdominal: Soft. There is tenderness.  Diffuse tenderness lower abdomen,  No peritoneal signs.  Musculoskeletal: Normal range of motion.  Skin: Skin is warm.  Psychiatric: He has a normal mood and affect.    ED Course   Procedures (including critical care time)  Labs Reviewed  POCT URINALYSIS DIP (DEVICE) -  Abnormal; Notable for the following:    Hgb urine dipstick MODERATE (*)    All other components within normal limits   Dg Abd 1 View  06/21/2013   *RADIOLOGY REPORT*  Clinical Data: Pelvic pain for 5 days with hematuria, history kidney stones, prostate cancer  ABDOMEN - 1 VIEW  Comparison: None Correlation:  CT abdomen pelvis 01/22/2013  Findings: Surgical clips in pelvis bilaterally due to prostatectomy. Normal bowel gas pattern. No bowel dilatation or bowel wall thickening. 2 mm diameter calcification projects over the inferior pole of the right kidney. Degenerative disk disease changes lumbar spine.  IMPRESSION: 2 mm right renal calculus. No definite ureteral calcification.   Original Report Authenticated By: Ulyses Southward, M.D.   1. Hematuria     MDM  Urine shows hematuria,   I suspect kidney stone as cause of pain,  Doubt appendicitis, no vomiting, no fever.   I advised pt he needs 24 hour recheck with his MD.   As pt's pain is resolving I don't think further imaging is needed, however pt advised to go to ED if symptoms worsen or change.  Lonia Skinner [place], PA-C 06/21/13 1408

## 2013-06-21 NOTE — ED Notes (Signed)
Headache and pelvic pain since Wednesday.  Used nasal spray, headache resolved.  Pelvic pain continued.  History of kidney stones.  Reports seeing a stone in urine Friday.  Now right side/abdominal pain.  Reports bowels are moving, no diarrhea, no constipation.

## 2013-06-22 ENCOUNTER — Ambulatory Visit (INDEPENDENT_AMBULATORY_CARE_PROVIDER_SITE_OTHER): Payer: Medicare Other | Admitting: Family Medicine

## 2013-06-22 ENCOUNTER — Encounter: Payer: Self-pay | Admitting: Family Medicine

## 2013-06-22 VITALS — BP 127/72 | HR 60 | Temp 99.5°F | Ht 71.0 in | Wt 225.0 lb

## 2013-06-22 DIAGNOSIS — N200 Calculus of kidney: Secondary | ICD-10-CM

## 2013-06-22 DIAGNOSIS — R319 Hematuria, unspecified: Secondary | ICD-10-CM | POA: Diagnosis not present

## 2013-06-22 NOTE — Patient Instructions (Signed)
Thank you for coming in, today! You may be passing a second kidney stone. Make sure you drink plenty of fluids. You can read more about kidney stones below. If your pain worsens or does not get better, come back to see [place]. Also call us if you develop any fevers, throwing up, or worse pain when you urinate. Please feel free to call with any questions or concerns at any time, at [phone]. --Dr. Casper Harrison  Kidney Stones Kidney stones (ureteral lithiasis) are solid masses that form inside your kidneys. The intense pain is caused by the stone moving through the kidney, ureter, bladder, and urethra (urinary tract). When the stone moves, the ureter starts to spasm around the stone. The stone is usually passed in the urine.  HOME CARE  Drink enough fluids to keep your pee (urine) clear or pale yellow. This helps to get the stone out.  Strain all pee through the provided strainer. Do not pee without peeing through the strainer, not even once. If you pee the stone out, catch it. The stone may be as small as a grain of salt. Take this to your doctor.  Only take medicine as told by your doctor.  Follow up with your doctor as told.  Get follow-up X-rays as told by your doctor. GET HELP RIGHT AWAY IF:   Your pain does not get better with medicine.  You have a fever.  Your pain increases and gets worse over 18 hours.  You have new belly (abdominal) pain.  You feel faint or pass out. MAKE SURE YOU:   Understand these instructions.  Will watch your condition.  Will get help right away if you are not doing well or get worse. Document Released: 04/02/2008 Document Revised: 01/07/2012 Document Reviewed: 08/12/2009 [facility] Patient Information 2014 [place], [place].

## 2013-06-24 DIAGNOSIS — N200 Calculus of kidney: Secondary | ICD-10-CM | POA: Insufficient documentation

## 2013-06-24 NOTE — Progress Notes (Signed)
  Subjective:    Patient ID: DEE MADAY, male    DOB: Apr 02, 1946, 67 y.o.   MRN: [id number]  HPI: Pt presents to clinic to follow up on kidney stone. Pt was treated the day before this visit at Urgent [facility] (pt has had several stones the past 6-8 years); pain began several days ago with a "pulled muscle" feeling in his right back that moved down into his flank and then his groin. At urgent care, he was found to have blood in his urine and KUB had a very small density over the right kidney suggestive of a stone. Pt passed a stone 3 days ago (two days prior to UC visit) and thinks he is in the process of passing a second stone. He had some mild burning with urination after the first stone was passed, and now has some mild right flank pain radiating to his groin (3/10, very similar to the pain prior to passing the first stone). Pain has been well-controlled with Aleve and hydrocodone prescribed by urgent care. Otherwise pt has only had some very mild nausea with the pain. No fevers, chills, vomiting, or other pain.  Review of Systems: As above. Generally feels well.     Objective:   Physical Exam BP 127/72  Pulse 60  Temp(Src) 99.5 F (37.5 C) (Oral)  Ht 5\' 11"  (1.803 m)  Wt 225 lb (102.059 kg)  BMI 31.39 kg/m2 Gen: well-appearing adult male in no distress, very pleasant and cooperative Cardio: RRR, no murmur Pulm: CTAB, no wheezes Skin: warm, dry, no rash, no diaphoresis Abd/back: mild right flank tenderness and groin tenderness, no CVA tenderness; abd soft, BS+     Assessment & Plan:

## 2013-06-24 NOTE — Assessment & Plan Note (Signed)
A: Recently diagnosed at Urgent Care, with blood in urine and apparent stone on KUB. Pt passed one stone and had symptoms suggestive of a second stone being passed. No evidence for frank UTI or pyelonephritis, currently. Did not retest urine as UA was done the day prior to this visit and was notable only for hematuria after passage of stone per pt report.  P: Supportive care with good hydration, pain control. Advised close f/u and return to clinic if any systemic signs/symptoms arise, or if pain worsens or is not controlled with mediations at home.

## 2013-07-01 ENCOUNTER — Telehealth: Payer: Self-pay | Admitting: Family Medicine

## 2013-07-01 NOTE — Telephone Encounter (Signed)
Will forward to MD. Tnia Anglada,CMA  

## 2013-07-01 NOTE — Telephone Encounter (Signed)
Pt is wanting a some advice on the tramadol 50 mg. He is wanting to know if the tramadol has side effects that would effect his bowels? If not he will need a refill of this medication JW

## 2013-07-02 ENCOUNTER — Encounter: Payer: Self-pay | Admitting: Family Medicine

## 2013-07-02 MED ORDER — TRAMADOL HCL 50 MG PO TABS: 50.0000 mg | ORAL_TABLET | Freq: Four times a day (QID) | ORAL | Status: DC | PRN

## 2013-07-02 NOTE — Telephone Encounter (Signed)
Please let him know that at high doses can cause constipation.  He could stop it and see if his symptoms improve and then if they recur with restarting tramadol.  Please call in the Rx  Thanks  LC

## 2013-07-02 NOTE — Telephone Encounter (Addendum)
[facility] called to check on patient's refill request of Tramadol.  Rx called in per Dr. Russ Halo orders.  Pharmacy will inform patient that higher dose can cause constipation and can stop it to see sxs recur/improve.  Gaylene Brooks, RN

## 2013-07-03 NOTE — Telephone Encounter (Signed)
Did you send this into [facility]????

## 2013-07-12 ENCOUNTER — Encounter: Payer: Self-pay | Admitting: Family Medicine

## 2013-07-13 ENCOUNTER — Inpatient Hospital Stay (HOSPITAL_COMMUNITY)
Admission: EM | Admit: 2013-07-13 | Discharge: 2013-07-16 | DRG: 418 | Disposition: A | Discharge status: Home / Self Care | Payer: Medicare Other | Attending: General Surgery | Admitting: General Surgery

## 2013-07-13 ENCOUNTER — Encounter (HOSPITAL_COMMUNITY): Payer: Self-pay | Admitting: Radiology

## 2013-07-13 ENCOUNTER — Inpatient Hospital Stay (HOSPITAL_COMMUNITY): Payer: Medicare Other

## 2013-07-13 ENCOUNTER — Emergency Department (HOSPITAL_COMMUNITY): Payer: Medicare Other

## 2013-07-13 ENCOUNTER — Encounter: Payer: Self-pay | Admitting: Family Medicine

## 2013-07-13 DIAGNOSIS — R109 Unspecified abdominal pain: Secondary | ICD-10-CM

## 2013-07-13 DIAGNOSIS — Z87891 Personal history of nicotine dependence: Secondary | ICD-10-CM | POA: Diagnosis not present

## 2013-07-13 DIAGNOSIS — K2970 Gastritis, unspecified, without bleeding: Secondary | ICD-10-CM | POA: Diagnosis not present

## 2013-07-13 DIAGNOSIS — R52 Pain, unspecified: Secondary | ICD-10-CM | POA: Diagnosis not present

## 2013-07-13 DIAGNOSIS — K8000 Calculus of gallbladder with acute cholecystitis without obstruction: Principal | ICD-10-CM

## 2013-07-13 DIAGNOSIS — K3189 Other diseases of stomach and duodenum: Secondary | ICD-10-CM | POA: Diagnosis not present

## 2013-07-13 DIAGNOSIS — K560 Paralytic ileus: Secondary | ICD-10-CM | POA: Diagnosis not present

## 2013-07-13 DIAGNOSIS — Z79899 Other long term (current) drug therapy: Secondary | ICD-10-CM

## 2013-07-13 DIAGNOSIS — K5900 Constipation, unspecified: Secondary | ICD-10-CM | POA: Diagnosis present

## 2013-07-13 DIAGNOSIS — R1084 Generalized abdominal pain: Secondary | ICD-10-CM | POA: Diagnosis not present

## 2013-07-13 DIAGNOSIS — Z9079 Acquired absence of other genital organ(s): Secondary | ICD-10-CM

## 2013-07-13 DIAGNOSIS — I4510 Unspecified right bundle-branch block: Secondary | ICD-10-CM | POA: Diagnosis not present

## 2013-07-13 DIAGNOSIS — K37 Unspecified appendicitis: Secondary | ICD-10-CM

## 2013-07-13 DIAGNOSIS — Z8546 Personal history of malignant neoplasm of prostate: Secondary | ICD-10-CM

## 2013-07-13 DIAGNOSIS — Z87442 Personal history of urinary calculi: Secondary | ICD-10-CM

## 2013-07-13 DIAGNOSIS — M129 Arthropathy, unspecified: Secondary | ICD-10-CM | POA: Diagnosis present

## 2013-07-13 DIAGNOSIS — R112 Nausea with vomiting, unspecified: Secondary | ICD-10-CM | POA: Diagnosis not present

## 2013-07-13 DIAGNOSIS — K838 Other specified diseases of biliary tract: Secondary | ICD-10-CM | POA: Diagnosis not present

## 2013-07-13 DIAGNOSIS — N189 Chronic kidney disease, unspecified: Secondary | ICD-10-CM | POA: Diagnosis present

## 2013-07-13 DIAGNOSIS — K810 Acute cholecystitis: Secondary | ICD-10-CM | POA: Diagnosis not present

## 2013-07-13 DIAGNOSIS — K8020 Calculus of gallbladder without cholecystitis without obstruction: Secondary | ICD-10-CM | POA: Diagnosis not present

## 2013-07-13 DIAGNOSIS — Z7982 Long term (current) use of aspirin: Secondary | ICD-10-CM | POA: Diagnosis not present

## 2013-07-13 LAB — CBC WITH DIFFERENTIAL/PLATELET
Basophils Relative: 0 % (ref 0–1)
Eosinophils Absolute: 0 10*3/uL (ref 0.0–0.7)
MCH: 27.8 pg (ref 26.0–34.0)
MCHC: 34.4 g/dL (ref 30.0–36.0)
Neutro Abs: 8.3 10*3/uL — ABNORMAL HIGH (ref 1.7–7.7)
Neutrophils Relative %: 80 % — ABNORMAL HIGH (ref 43–77)
Platelets: 252 10*3/uL (ref 150–400)
RBC: 4.71 MIL/uL (ref 4.22–5.81)

## 2013-07-13 LAB — COMPREHENSIVE METABOLIC PANEL
ALT: 9 U/L (ref 0–53)
AST: 12 U/L (ref 0–37)
Albumin: 3.2 g/dL — ABNORMAL LOW (ref 3.5–5.2)
Alkaline Phosphatase: 66 U/L (ref 39–117)
Potassium: 3.7 mEq/L (ref 3.5–5.1)
Sodium: 135 mEq/L (ref 135–145)
Total Protein: 6.5 g/dL (ref 6.0–8.3)

## 2013-07-13 LAB — URINALYSIS, ROUTINE W REFLEX MICROSCOPIC
Bilirubin Urine: NEGATIVE
Glucose, UA: 100 mg/dL — AB
Nitrite: NEGATIVE
Specific Gravity, Urine: 1.019 (ref 1.005–1.030)
pH: 6.5 (ref 5.0–8.0)

## 2013-07-13 LAB — URINE MICROSCOPIC-ADD ON

## 2013-07-13 MED ORDER — SODIUM CHLORIDE 0.9 % IV SOLN
1.0000 g | INTRAVENOUS | Status: DC
  Administered 2013-07-13 – 2013-07-16 (×4): 1 g via INTRAVENOUS
  Filled 2013-07-13 (×4): qty 1

## 2013-07-13 MED ORDER — MORPHINE SULFATE 4 MG/ML IJ SOLN
4.0000 mg | Freq: Once | INTRAMUSCULAR | Status: AC
  Administered 2013-07-13: 3.95 mg via INTRAVENOUS

## 2013-07-13 MED ORDER — ENOXAPARIN SODIUM 40 MG/0.4ML ~~LOC~~ SOLN
40.0000 mg | SUBCUTANEOUS | Status: DC
  Administered 2013-07-13: 40 mg via SUBCUTANEOUS
  Filled 2013-07-13 (×2): qty 0.4

## 2013-07-13 MED ORDER — HYDROMORPHONE HCL PF 1 MG/ML IJ SOLN
1.0000 mg | INTRAMUSCULAR | Status: DC | PRN
  Administered 2013-07-13 – 2013-07-15 (×2): 1 mg via INTRAVENOUS
  Filled 2013-07-13 (×3): qty 1

## 2013-07-13 MED ORDER — MORPHINE SULFATE 4 MG/ML IJ SOLN
INTRAMUSCULAR | Status: AC
  Administered 2013-07-13: 3.95 mg via INTRAVENOUS
  Filled 2013-07-13: qty 1

## 2013-07-13 MED ORDER — SODIUM CHLORIDE 0.9 % IV SOLN: 12.5000 mg | INTRAVENOUS | Status: DC | PRN

## 2013-07-13 MED ORDER — HYDROMORPHONE HCL PF 1 MG/ML IJ SOLN: 1.0000 mg | INTRAMUSCULAR | Status: DC | PRN

## 2013-07-13 MED ORDER — WHITE PETROLATUM GEL
Status: AC
  Filled 2013-07-13: qty 5

## 2013-07-13 MED ORDER — IOHEXOL 300 MG/ML  SOLN
100.0000 mL | Freq: Once | INTRAMUSCULAR | Status: AC | PRN
  Administered 2013-07-13: 100 mL via INTRAVENOUS

## 2013-07-13 MED ORDER — ONDANSETRON HCL 4 MG/2ML IJ SOLN
4.0000 mg | Freq: Once | INTRAMUSCULAR | Status: AC
  Administered 2013-07-13: 4 mg via INTRAVENOUS
  Filled 2013-07-13: qty 2

## 2013-07-13 MED ORDER — PROMETHAZINE HCL 25 MG/ML IJ SOLN: 12.5000 mg | INTRAMUSCULAR | Status: DC | PRN

## 2013-07-13 MED ORDER — LATANOPROST 0.005 % OP SOLN
1.0000 [drp] | Freq: Every day | OPHTHALMIC | Status: DC
  Filled 2013-07-13 (×2): qty 2.5

## 2013-07-13 MED ORDER — SODIUM CHLORIDE 0.9 % IV SOLN
1000.0000 mL | INTRAVENOUS | Status: DC
  Administered 2013-07-13 – 2013-07-15 (×5): 1000 mL via INTRAVENOUS

## 2013-07-13 MED ORDER — ONDANSETRON HCL 4 MG/2ML IJ SOLN
4.0000 mg | Freq: Four times a day (QID) | INTRAMUSCULAR | Status: DC | PRN
  Administered 2013-07-14 – 2013-07-16 (×5): 4 mg via INTRAVENOUS
  Filled 2013-07-13 (×5): qty 2

## 2013-07-13 MED ORDER — MORPHINE SULFATE 2 MG/ML IJ SOLN
2.0000 mg | Freq: Once | INTRAMUSCULAR | Status: AC
  Administered 2013-07-13: 2 mg via INTRAVENOUS
  Filled 2013-07-13: qty 1

## 2013-07-13 MED ORDER — TECHNETIUM TC 99M MEBROFENIN IV KIT
5.0000 | PACK | Freq: Once | INTRAVENOUS | Status: AC | PRN
  Administered 2013-07-13: 5 via INTRAVENOUS

## 2013-07-13 MED ORDER — PROMETHAZINE HCL 25 MG/ML IJ SOLN
12.5000 mg | Freq: Once | INTRAMUSCULAR | Status: AC
  Administered 2013-07-13: 12.5 mg via INTRAVENOUS
  Filled 2013-07-13: qty 1

## 2013-07-13 MED ORDER — BRINZOLAMIDE 1 % OP SUSP
1.0000 [drp] | Freq: Three times a day (TID) | OPHTHALMIC | Status: DC
  Administered 2013-07-13 – 2013-07-16 (×7): 1 [drp] via OPHTHALMIC
  Filled 2013-07-13 (×2): qty 10

## 2013-07-13 MED ORDER — SODIUM CHLORIDE 0.9 % IV SOLN
1000.0000 mL | Freq: Once | INTRAVENOUS | Status: AC
  Administered 2013-07-13: 1000 mL via INTRAVENOUS

## 2013-07-13 MED ORDER — PANTOPRAZOLE SODIUM 40 MG IV SOLR
40.0000 mg | Freq: Every day | INTRAVENOUS | Status: DC
  Administered 2013-07-13 – 2013-07-15 (×3): 40 mg via INTRAVENOUS
  Filled 2013-07-13 (×5): qty 40

## 2013-07-13 MED ORDER — TIMOLOL MALEATE 0.5 % OP SOLN
1.0000 [drp] | Freq: Every day | OPHTHALMIC | Status: DC
  Administered 2013-07-13 – 2013-07-16 (×4): 1 [drp] via OPHTHALMIC
  Filled 2013-07-13: qty 5

## 2013-07-13 NOTE — H&P (Signed)
ISSAAC [name] is an 67 y.o. male.   Chief Complaint: Right-sided abdominal pain HPI: 67 yo male presents with a few weeks of vague mid-abdominal pain, which progressed into worsening diffuse right-sided abdominal pain, nausea, and vomiting over the last few days.  He reports difficulty with bowel movements over the last couple of months.  He was hospitalized in March with cecal inflammation, which improved with Invanz.  He underwent colonoscopy 03/16/13, which was unremarkable except for some mild diverticulosis.  He was seen in the ED in August for right-sided kidney stones.  He denies any fever.  The worsening pain, nausea, and vomiting began after dinner on 07/12/13.    Past Medical History  Diagnosis Date  . Chronic kidney disease   . Inguinal hernia     right  . Prostate cancer 1990  . Arthritis     SHOULDER & KNEES    Past Surgical History  Procedure Laterality Date  . Prostatectomy    . Tonsillectomy    . Eye surgery    . Cataract extraction    . Colonoscopy  01/07/2004    Dr. Stan Head    Family History  Problem Relation Age of Onset  . Prostate cancer Father   . Pancreatic cancer Mother   . Colon cancer Neg Hx    Social History:  reports that he quit smoking about 31 years ago. He has never used smokeless tobacco. He reports that he does not drink alcohol or use illicit drugs.  Allergies:  Allergies  Allergen Reactions  . Claritin [Loratadine] Other (See Comments)    Increases pressure in eyes  . Milk-Related Compounds Other (See Comments)    INDIGESTION    Prior to Admission medications   Medication Sig Start Date End Date Taking? Authorizing Provider  Ascorbic Acid (VITAMIN C) 500 MG tablet Take 500 mg by mouth daily.     Yes Historical Provider, MD  aspirin 325 MG tablet Take 325 mg by mouth daily.     Yes Historical Provider, MD  bimatoprost (LUMIGAN) 0.01 % SOLN Place 1 drop into the left eye at bedtime.   Yes Historical Provider, MD  brinzolamide (AZOPT) 1  % ophthalmic suspension Place 1 drop into the left eye 3 (three) times daily.   Yes Historical Provider, MD  CALCIUM PO Take 1 tablet by mouth 2 (two) times daily. 800mg    Yes Historical Provider, MD  Cholecalciferol (VITAMIN D-3) 1000 UNITS CAPS Take 1 capsule by mouth daily.   Yes Historical Provider, MD  Garlic 1000 MG CAPS Take 1 capsule by mouth daily.     Yes Historical Provider, MD  HYDROcodone-acetaminophen (NORCO/VICODIN) 5-325 MG per tablet Take 2 tablets by mouth every 4 (four) hours as needed for pain. 06/21/13  Yes Lonia Skinner Sofia, PA-C  Multiple Vitamins-Minerals (CENTRUM SILVER PO) Take 1 tablet by mouth daily.     Yes Historical Provider, MD  timolol (TIMOPTIC) 0.5 % ophthalmic solution Place 1 drop into the left eye daily.  03/09/13  Yes Historical Provider, MD  traMADol (ULTRAM) 50 MG tablet Take 1 tablet (50 mg total) by mouth every 6 (six) hours as needed for pain. 07/02/13  Yes Carney Living, MD  Vitamins-Lipotropics (B-100 COMPLEX) TABS Take 1 tablet by mouth daily.     Yes Historical Provider, MD  Triptorelin Pamoate 11.25 MG SUSR Inject 11.25 mg into the muscle every 3 (three) months. Take injections quarterly    Historical Provider, MD     Results for orders  placed during the hospital encounter of 07/13/13 (from the past 48 hour(s))  CBC WITH DIFFERENTIAL     Status: Abnormal   Collection Time    07/13/13  2:32 AM      Result Value Range   WBC 10.4  4.0 - 10.5 K/uL   RBC 4.71  4.22 - 5.81 MIL/uL   Hemoglobin 13.1  13.0 - 17.0 g/dL   HCT 14.7 (*) 82.9 - 56.2 %   MCV 80.9  78.0 - 100.0 fL   MCH 27.8  26.0 - 34.0 pg   MCHC 34.4  30.0 - 36.0 g/dL   RDW 13.0  86.5 - 78.4 %   Platelets 252  150 - 400 K/uL   Neutrophils Relative % 80 (*) 43 - 77 %   Neutro Abs 8.3 (*) 1.7 - 7.7 K/uL   Lymphocytes Relative 6 (*) 12 - 46 %   Lymphs Abs 0.6 (*) 0.7 - 4.0 K/uL   Monocytes Relative 14 (*) 3 - 12 %   Monocytes Absolute 1.4 (*) 0.1 - 1.0 K/uL   Eosinophils Relative 0  0  - 5 %   Eosinophils Absolute 0.0  0.0 - 0.7 K/uL   Basophils Relative 0  0 - 1 %   Basophils Absolute 0.0  0.0 - 0.1 K/uL  COMPREHENSIVE METABOLIC PANEL     Status: Abnormal   Collection Time    07/13/13  2:32 AM      Result Value Range   Sodium 135  135 - 145 mEq/L   Potassium 3.7  3.5 - 5.1 mEq/L   Chloride 99  96 - 112 mEq/L   CO2 21  19 - 32 mEq/L   Glucose, Bld 138 (*) 70 - 99 mg/dL   BUN 11  6 - 23 mg/dL   Creatinine, Ser 6.96  0.50 - 1.35 mg/dL   Calcium 8.7  8.4 - 29.5 mg/dL   Total Protein 6.5  6.0 - 8.3 g/dL   Albumin 3.2 (*) 3.5 - 5.2 g/dL   AST 12  0 - 37 U/L   ALT 9  0 - 53 U/L   Alkaline Phosphatase 66  39 - 117 U/L   Total Bilirubin 0.8  0.3 - 1.2 mg/dL   GFR calc non Af Amer >90  >90 mL/min   GFR calc Af Amer >90  >90 mL/min   Comment: (NOTE)     The eGFR has been calculated using the CKD EPI equation.     This calculation has not been validated in all clinical situations.     eGFR's persistently <90 mL/min signify possible Chronic Kidney     Disease.  LIPASE, BLOOD     Status: None   Collection Time    07/13/13  2:32 AM      Result Value Range   Lipase 46  11 - 59 U/L  LACTIC ACID, PLASMA     Status: None   Collection Time    07/13/13  2:32 AM      Result Value Range   Lactic Acid, Venous 1.3  0.5 - 2.2 mmol/L  URINALYSIS, ROUTINE W REFLEX MICROSCOPIC     Status: Abnormal   Collection Time    07/13/13  3:42 AM      Result Value Range   Color, Urine YELLOW  YELLOW   APPearance CLEAR  CLEAR   Specific Gravity, Urine 1.019  1.005 - 1.030   pH 6.5  5.0 - 8.0   Glucose, UA 100 (*) NEGATIVE  mg/dL   Hgb urine dipstick SMALL (*) NEGATIVE   Bilirubin Urine NEGATIVE  NEGATIVE   Ketones, ur >80 (*) NEGATIVE mg/dL   Protein, ur NEGATIVE  NEGATIVE mg/dL   Urobilinogen, UA 1.0  0.0 - 1.0 mg/dL   Nitrite NEGATIVE  NEGATIVE   Leukocytes, UA NEGATIVE  NEGATIVE  URINE MICROSCOPIC-ADD ON     Status: Abnormal   Collection Time    07/13/13  3:42 AM      Result  Value Range   Squamous Epithelial / LPF RARE  RARE   WBC, UA 0-2  <3 WBC/hpf   RBC / HPF 3-6  <3 RBC/hpf   Bacteria, UA FEW (*) RARE   Urine-Other MUCOUS PRESENT     Ct Abdomen Pelvis W Contrast  07/13/2013   *RADIOLOGY REPORT*  Clinical Data: Abdominal pain  CT ABDOMEN AND PELVIS WITH CONTRAST  Technique:  Multidetector CT imaging of the abdomen and pelvis was performed following the standard protocol during bolus administration of intravenous contrast.  Contrast: OMNIPAQUE IOHEXOL 300 MG/ML  SOLN  Comparison: prior CT from 01/22/2013.  Findings:  Bibasilar atelectasis is present.  The liver is normal.  There is mild periportal edema.  Small gallstone is noted within the gallbladder lumen.  The gallbladder is distended and hydropic in appearance with associated pericholecystic fat stranding.  The common bile duct is prominent measuring 8 mm, similar to prior.  The main pancreatic duct is also prominent measuring approximately 5 mm.  The spleen, adrenal glands are unremarkable.  The kidneys are normal.  There is no evidence of bowel obstruction.  Multiple metallic Jean Rosenthal again seen within the lower abdomen. Inflammatory fat stranding is seen within the right lower quadrant without definite loculated fluid collection.  Overall, these findings are improved as compared to the prior examination.  No free intraperitoneal air identified.  The colon is otherwise unremarkable.  Bladder is normal. The the patient is status post prostatectomy. 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  The hernia the right is larger than the left main has a small amount of fluid internally.  Degenerative changes are again noted within the lumbar spine.  No acute osseous abnormality identified.  IMPRESSION:  1. Persistent inflammatory changes within the right lower quadrant about the cecum.  Overall, these findings are not as severe as compared to the prior CT from 01/22/2013. Underlying neoplasm is not  excluded. 2.  Distended gallbladder with mild pericholecystic fat stranding. Findings may be secondary to the right lower quadrant inflammatory process, although possible acute cholecystitis is not excluded. Correlation with right upper quadrant ultrasound could be performed as clinically indicated.   Original Report Authenticated By: Rise Mu, M.D.    Review of Systems  Constitutional: Negative for weight loss.  HENT: Negative for hearing loss, ear pain, neck pain, tinnitus and ear discharge.   Eyes: Negative for blurred vision, double vision, photophobia and pain.  Respiratory: Negative for cough, sputum production and shortness of breath.   Cardiovascular: Negative for chest pain.  Gastrointestinal: Positive for nausea, vomiting, abdominal pain and constipation.  Genitourinary: Negative for dysuria, urgency, frequency and flank pain.  Musculoskeletal: Negative for myalgias, back pain, joint pain and falls.  Neurological: Negative for dizziness, tingling, sensory change, focal weakness, loss of consciousness and headaches.  Endo/Heme/Allergies: Does not bruise/bleed easily.  Psychiatric/Behavioral: Negative for depression, memory loss and substance abuse. The patient is not nervous/anxious.     Blood pressure 112/73, pulse 58, temperature 98.1 F (36.7 C), temperature source  Oral, resp. rate 18, SpO2 94.00%. Physical Exam  WDWN in NAD HEENT:  EOMI, sclera anicteric Neck:  No masses, no thyromegaly Lungs:  CTA bilaterally; normal respiratory effort CV:  Regular rate and rhythm; no murmurs Abd:  +BS; healed lower midline incision;mildly tender diffusely with no localization - although patient reports having had some pain medicine recently Ext:  Well-perfused; no edema Skin:  Warm, dry; no sign of jaundice  Assessment/Plan 1.  Distended gallbladder with some mild stranding - probable acute cholecystitis, but could be secondary inflammation from RLQ process 2.  RLQ  inflammation - recurrent typhlitis?    Admit for IV abx, hydration HIDA scan to rule out acute cholecystitis   Rubi Tooley K. 07/13/2013, 7:44 AM

## 2013-07-13 NOTE — ED Notes (Signed)
Surgery into see.

## 2013-07-13 NOTE — ED Provider Notes (Signed)
CSN: [id number]     Arrival date & time 07/13/13  0154 History   First MD Initiated Contact with Patient 07/13/13 [phone]     Chief Complaint  Patient presents with  . Abdominal Pain   (Consider location/radiation/quality/duration/timing/severity/associated sxs/prior Treatment) HPI Comments: 67 yo AA male presents with cc of abd pain.  Worsening symptoms over past 7 hrs.  EMS called and gave fentanyl and zofran with improvement of symptoms.    Patient has a h/o kidney stone which he passed in late August 2014.    Last "good BM" was Aug 23rd  Mar 16, 2013 - Colonscopy - diverticulosis mild; o/w normal Mar 2014:  Admission for acute cecitis/typhlitis and leukocytosis.  The patient was placed on bowel rest and Invanz. CT ABD/Pelvis - March 2014 IMPRESSION:   1.  Intense appearing right lower quadrant inflammatory process appears centered about the cecum.  Underlying neoplasm, although felt less likely given normal appearance of the cecum on the comparison study, cannot be excluded.  Marked inflammation of the proximal appendix is likely secondary to the cecal process with an appendicolith noted. 2.  Small fat containing bilateral inguinal hernias, larger on the right.   Patient is a 67 y.o. male presenting with abdominal pain. The history is provided by the patient and the spouse.  Abdominal Pain Pain location:  Generalized Pain quality: aching   Pain radiates to:  Does not radiate Pain severity:  Severe Onset quality:  Gradual Duration:  7 hours Timing:  Constant Progression:  Unchanged Chronicity:  Recurrent Context comment:  No recent surgeries.  H/O kidney stone - passed on AUG April 05, 2023. 2013/03/18 Relieved by:  Nothing (Pt has norco and ultram, but didn't take for current issue) Associated symptoms: constipation, nausea and vomiting   Associated symptoms: no chest pain, no chills, no cough, no diarrhea, no fatigue, no fever and no shortness of breath     Past Medical History    Diagnosis Date  . Chronic kidney disease   . Inguinal hernia     right  . Prostate cancer 1990  . Arthritis     SHOULDER & KNEES   Past Surgical History  Procedure Laterality Date  . Prostatectomy    . Tonsillectomy    . Eye surgery    . Cataract extraction    . Colonoscopy  01/07/2004    Dr. Stan Head   Family History  Problem Relation Age of Onset  . Prostate cancer Father   . Pancreatic cancer Mother   . Colon cancer Neg Hx    History  Substance Use Topics  . Smoking status: Former Smoker    Quit date: 10/29/1981  . Smokeless tobacco: Never Used  . Alcohol Use: No    Review of Systems  Constitutional: Positive for activity change. Negative for fever, chills, diaphoresis, appetite change and fatigue.  HENT: Negative.   Eyes: Negative.   Respiratory: Negative.  Negative for cough, choking, chest tightness and shortness of breath.   Cardiovascular: Negative.  Negative for chest pain, palpitations and leg swelling.  Gastrointestinal: Positive for nausea, vomiting, abdominal pain and constipation. Negative for diarrhea, blood in stool, abdominal distention, anal bleeding and rectal pain.  Endocrine: Negative.   Genitourinary: Negative for frequency, flank pain, enuresis and difficulty urinating.  Neurological: Negative.     Allergies  Claritin and Milk-related compounds  Home Medications   Current Outpatient Rx  Name  Route  Sig  Dispense  Refill  . Ascorbic Acid (VITAMIN C) 500 MG  tablet   Oral   Take 500 mg by mouth daily.           Marland Kitchen aspirin 325 MG tablet   Oral   Take 325 mg by mouth daily.           . bimatoprost (LUMIGAN) 0.01 % SOLN   Left Eye   Place 1 drop into the left eye at bedtime.         . brinzolamide (AZOPT) 1 % ophthalmic suspension   Left Eye   Place 1 drop into the left eye 3 (three) times daily.         Marland Kitchen CALCIUM PO   Oral   Take 1 tablet by mouth 2 (two) times daily. 800mg          . Cholecalciferol (VITAMIN D-3)  1000 UNITS CAPS   Oral   Take 1 capsule by mouth daily.         . Garlic 1000 MG CAPS   Oral   Take 1 capsule by mouth daily.           Marland Kitchen HYDROcodone-acetaminophen (NORCO/VICODIN) 5-325 MG per tablet   Oral   Take 2 tablets by mouth every 4 (four) hours as needed for pain.   20 tablet   0   . Multiple Vitamins-Minerals (CENTRUM SILVER PO)   Oral   Take 1 tablet by mouth daily.           . timolol (TIMOPTIC) 0.5 % ophthalmic solution   Left Eye   Place 1 drop into the left eye daily.          . traMADol (ULTRAM) 50 MG tablet   Oral   Take 1 tablet (50 mg total) by mouth every 6 (six) hours as needed for pain.   30 tablet   3   . Vitamins-Lipotropics (B-100 COMPLEX) TABS   Oral   Take 1 tablet by mouth daily.           . Triptorelin Pamoate 11.25 MG SUSR   Intramuscular   Inject 11.25 mg into the muscle every 3 (three) months. Take injections quarterly          BP 120/71  Pulse 56  Temp(Src) 97.6 F (36.4 C) (Oral)  Resp 18  SpO2 97% Physical Exam  Constitutional: He is oriented to person, place, and time. He appears well-developed and well-nourished.  HENT:  Head: Normocephalic and atraumatic.  Eyes: Conjunctivae are normal. Pupils are equal, round, and reactive to light.  Neck: Normal range of motion. Neck supple.  Cardiovascular: Normal rate, regular rhythm and normal heart sounds.  Exam reveals no gallop and no friction rub.   No murmur heard. Pulmonary/Chest: Effort normal and breath sounds normal. No respiratory distress. He has no wheezes. He has no rales. He exhibits no tenderness.  Abdominal: Soft. Normal appearance and bowel sounds are normal. There is no hepatosplenomegaly, splenomegaly or hepatomegaly. There is generalized tenderness. There is no rigidity, no rebound, no guarding, no CVA tenderness, no tenderness at McBurney's point and negative Murphy's sign. A hernia is present. Hernia confirmed positive in the right inguinal area.     Genitourinary: Testes normal and penis normal. Cremasteric reflex is present. Circumcised.  Musculoskeletal: Normal range of motion.  Neurological: He is alert and oriented to person, place, and time. He has normal reflexes.    ED Course  Procedures (including critical care time) Labs Review Labs Reviewed  CBC WITH DIFFERENTIAL - Abnormal; Notable for the following:  HCT 38.1 (*)    Neutrophils Relative % 80 (*)    Neutro Abs 8.3 (*)    Lymphocytes Relative 6 (*)    Lymphs Abs 0.6 (*)    Monocytes Relative 14 (*)    Monocytes Absolute 1.4 (*)    All other components within normal limits  COMPREHENSIVE METABOLIC PANEL - Abnormal; Notable for the following:    Glucose, Bld 138 (*)    Albumin 3.2 (*)    All other components within normal limits  URINALYSIS, ROUTINE W REFLEX MICROSCOPIC - Abnormal; Notable for the following:    Glucose, UA 100 (*)    Hgb urine dipstick SMALL (*)    Ketones, ur >80 (*)    All other components within normal limits  URINE MICROSCOPIC-ADD ON - Abnormal; Notable for the following:    Bacteria, UA FEW (*)    All other components within normal limits  LIPASE, BLOOD  LACTIC ACID, PLASMA   Imaging Review Ct Abdomen Pelvis W Contrast  07/13/2013   *RADIOLOGY REPORT*  Clinical Data: Abdominal pain  CT ABDOMEN AND PELVIS WITH CONTRAST  Technique:  Multidetector CT imaging of the abdomen and pelvis was performed following the standard protocol during bolus administration of intravenous contrast.  Contrast: OMNIPAQUE IOHEXOL 300 MG/ML  SOLN  Comparison: prior CT from 01/22/2013.  Findings:  Bibasilar atelectasis is present.  The liver is normal.  There is mild periportal edema.  Small gallstone is noted within the gallbladder lumen.  The gallbladder is distended and hydropic in appearance with associated pericholecystic fat stranding.  The common bile duct is prominent measuring 8 mm, similar to prior.  The main pancreatic duct is also prominent  measuring approximately 5 mm.  The spleen, adrenal glands are unremarkable.  The kidneys are normal.  There is no evidence of bowel obstruction.  Multiple metallic Jean Rosenthal again seen within the lower abdomen. Inflammatory fat stranding is seen within the right lower quadrant without definite loculated fluid collection.  Overall, these findings are improved as compared to the prior examination.  No free intraperitoneal air identified.  The colon is otherwise unremarkable.  Bladder is normal. The the patient is status post prostatectomy. 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  The hernia the right is larger than the left main has a small amount of fluid internally.  Degenerative changes are again noted within the lumbar spine.  No acute osseous abnormality identified.  IMPRESSION:  1. Persistent inflammatory changes within the right lower quadrant about the cecum.  Overall, these findings are not as severe as compared to the prior CT from 01/22/2013. Underlying neoplasm is not excluded. 2.  Distended gallbladder with mild pericholecystic fat stranding. Findings may be secondary to the right lower quadrant inflammatory process, although possible acute cholecystitis is not excluded. Correlation with right upper quadrant ultrasound could be performed as clinically indicated.   Original Report Authenticated By: Rise Mu, M.D.     Date: 07/13/2013  Rate: 54  Rhythm: normal sinus rhythm  QRS Axis: normal  Intervals: normal  ST/T Wave abnormalities: normal  Conduction Disutrbances:right bundle branch block  Narrative Interpretation:   Old EKG Reviewed: none available  Results for orders placed during the hospital encounter of 07/13/13  CBC WITH DIFFERENTIAL      Result Value Range   WBC 10.4  4.0 - 10.5 K/uL   RBC 4.71  4.22 - 5.81 MIL/uL   Hemoglobin 13.1  13.0 - 17.0 g/dL   HCT 78.2 (*) 95.6 -  52.0 %   MCV 80.9  78.0 - 100.0 fL   MCH 27.8  26.0 - 34.0 pg   MCHC 34.4   30.0 - 36.0 g/dL   RDW 16.1  09.6 - 04.5 %   Platelets 252  150 - 400 K/uL   Neutrophils Relative % 80 (*) 43 - 77 %   Neutro Abs 8.3 (*) 1.7 - 7.7 K/uL   Lymphocytes Relative 6 (*) 12 - 46 %   Lymphs Abs 0.6 (*) 0.7 - 4.0 K/uL   Monocytes Relative 14 (*) 3 - 12 %   Monocytes Absolute 1.4 (*) 0.1 - 1.0 K/uL   Eosinophils Relative 0  0 - 5 %   Eosinophils Absolute 0.0  0.0 - 0.7 K/uL   Basophils Relative 0  0 - 1 %   Basophils Absolute 0.0  0.0 - 0.1 K/uL  COMPREHENSIVE METABOLIC PANEL      Result Value Range   Sodium 135  135 - 145 mEq/L   Potassium 3.7  3.5 - 5.1 mEq/L   Chloride 99  96 - 112 mEq/L   CO2 21  19 - 32 mEq/L   Glucose, Bld 138 (*) 70 - 99 mg/dL   BUN 11  6 - 23 mg/dL   Creatinine, Ser 4.09  0.50 - 1.35 mg/dL   Calcium 8.7  8.4 - 81.1 mg/dL   Total Protein 6.5  6.0 - 8.3 g/dL   Albumin 3.2 (*) 3.5 - 5.2 g/dL   AST 12  0 - 37 U/L   ALT 9  0 - 53 U/L   Alkaline Phosphatase 66  39 - 117 U/L   Total Bilirubin 0.8  0.3 - 1.2 mg/dL   GFR calc non Af Amer >90  >90 mL/min   GFR calc Af Amer >90  >90 mL/min  LIPASE, BLOOD      Result Value Range   Lipase 46  11 - 59 U/L  URINALYSIS, ROUTINE W REFLEX MICROSCOPIC      Result Value Range   Color, Urine YELLOW  YELLOW   APPearance CLEAR  CLEAR   Specific Gravity, Urine 1.019  1.005 - 1.030   pH 6.5  5.0 - 8.0   Glucose, UA 100 (*) NEGATIVE mg/dL   Hgb urine dipstick SMALL (*) NEGATIVE   Bilirubin Urine NEGATIVE  NEGATIVE   Ketones, ur >80 (*) NEGATIVE mg/dL   Protein, ur NEGATIVE  NEGATIVE mg/dL   Urobilinogen, UA 1.0  0.0 - 1.0 mg/dL   Nitrite NEGATIVE  NEGATIVE   Leukocytes, UA NEGATIVE  NEGATIVE  LACTIC ACID, PLASMA      Result Value Range   Lactic Acid, Venous 1.3  0.5 - 2.2 mmol/L  URINE MICROSCOPIC-ADD ON      Result Value Range   Squamous Epithelial / LPF RARE  RARE   WBC, UA 0-2  <3 WBC/hpf   RBC / HPF 3-6  <3 RBC/hpf   Bacteria, UA FEW (*) RARE   Urine-Other MUCOUS PRESENT        MDM  No  diagnosis found.  67 year old African male presents emergency department with chief complaint of abdominal pain, nausea and vomiting. Patient has had abdominal pain for the past several months since March of 2014 at which time he was admitted for an "abdominal infection".  Since that time he developed kidney stones on the right side which he reports recently passed in August 2014.  Additionally he had a colonoscopy in May 2014 which was unremarkable except for  mild diverticulosis.  Today he presents with 7 hours of worsening abdominal pain and nausea and vomiting.  Plan for symptomatic treatment, blood work, urine, and CT abdomen and pelvis.  Blood work is unremarkable. Patient continues to be nauseated after 2 doses Zofran. Will switch to Phenergan.   5:18 AM BP 120/71  Pulse 56  Temp(Src) 97.6 F (36.4 C) (Oral)  Resp 18  SpO2 97% CT concerning for RLQ inflammatory process and gall bladder distension and pericholecystic fat stranding.  Will consult general surgery.  Will hold on antibiotics as pt is afebrile and his WBC is 10.4.  5:34 AM Discussed case with general surgery Dr. Corliss Skains who will evaluate the pt in the ER.  Pt and wife are aware of the plan.    General surgery to admit.  Will w/u RUQ findings with a HIDA scan.    The patient appears reasonably stabilized for admission considering the current resources, flow, and capabilities available in the ED at this time, and I doubt any other [facility] requiring further screening and/or treatment in the ED prior to admission.   Darlys Gales, MD 07/13/13 [phone]

## 2013-07-13 NOTE — ED Notes (Signed)
1st bag of nss infusing

## 2013-07-13 NOTE — ED Notes (Signed)
EMS given pt zofran 4mg  and fentanyl in route

## 2013-07-13 NOTE — ED Notes (Signed)
Unable to give report call back in 15 minutes

## 2013-07-13 NOTE — ED Notes (Signed)
Report called to [phone]

## 2013-07-13 NOTE — ED Notes (Signed)
Pt given  Phenergan iv for nv.  To c-t.  Pt c/o pain still

## 2013-07-13 NOTE — ED Notes (Signed)
The pt was getting  A liter bolus of nss and came back from c-t with his iv disconnected from the angiov=cath.  Iv fluid reattached running wo open again

## 2013-07-13 NOTE — ED Notes (Signed)
The pt is still feeling ill and is spitting up not vomiting up.  He has finished his oral contrast.  Edp back in to see

## 2013-07-13 NOTE — ED Notes (Signed)
Pt reports having lower abdominal pain, intermittently since march after abdominal infection. Pt has not had normal bowel movement x 3 weeks.

## 2013-07-13 NOTE — ED Notes (Signed)
The pt returned from c-t vomited approx  And reports that he feels better

## 2013-07-13 NOTE — ED Notes (Signed)
Pt request for pain medications, MD made aware and to follow up.

## 2013-07-14 ENCOUNTER — Inpatient Hospital Stay (HOSPITAL_COMMUNITY): Payer: Medicare Other | Admitting: Anesthesiology

## 2013-07-14 ENCOUNTER — Encounter (HOSPITAL_COMMUNITY): Payer: Self-pay | Admitting: Anesthesiology

## 2013-07-14 ENCOUNTER — Encounter (HOSPITAL_COMMUNITY): Admission: EM | Disposition: A | Discharge status: Home / Self Care | Payer: Self-pay | Source: Home / Self Care

## 2013-07-14 DIAGNOSIS — K8000 Calculus of gallbladder with acute cholecystitis without obstruction: Secondary | ICD-10-CM | POA: Diagnosis not present

## 2013-07-14 DIAGNOSIS — K5900 Constipation, unspecified: Secondary | ICD-10-CM | POA: Diagnosis not present

## 2013-07-14 DIAGNOSIS — N189 Chronic kidney disease, unspecified: Secondary | ICD-10-CM | POA: Diagnosis not present

## 2013-07-14 DIAGNOSIS — K8020 Calculus of gallbladder without cholecystitis without obstruction: Secondary | ICD-10-CM | POA: Diagnosis not present

## 2013-07-14 DIAGNOSIS — K560 Paralytic ileus: Secondary | ICD-10-CM | POA: Diagnosis not present

## 2013-07-14 HISTORY — PX: CHOLECYSTECTOMY: SHX55

## 2013-07-14 LAB — CBC
HCT: 37.7 % — ABNORMAL LOW (ref 39.0–52.0)
MCHC: 35.3 g/dL (ref 30.0–36.0)
Platelets: 280 10*3/uL (ref 150–400)
RDW: 15.8 % — ABNORMAL HIGH (ref 11.5–15.5)
WBC: 13.4 10*3/uL — ABNORMAL HIGH (ref 4.0–10.5)

## 2013-07-14 LAB — BASIC METABOLIC PANEL
BUN: 9 mg/dL (ref 6–23)
CO2: 23 mEq/L (ref 19–32)
Chloride: 102 mEq/L (ref 96–112)
Creatinine, Ser: 0.74 mg/dL (ref 0.50–1.35)
Glucose, Bld: 111 mg/dL — ABNORMAL HIGH (ref 70–99)

## 2013-07-14 LAB — SURGICAL PCR SCREEN: Staphylococcus aureus: POSITIVE — AB

## 2013-07-14 SURGERY — LAPAROSCOPIC CHOLECYSTECTOMY WITH INTRAOPERATIVE CHOLANGIOGRAM
Anesthesia: General | Site: Abdomen | Wound class: Dirty or Infected

## 2013-07-14 MED ORDER — PHENYLEPHRINE HCL 10 MG/ML IJ SOLN
INTRAMUSCULAR | Status: DC | PRN
  Administered 2013-07-14: 80 ug via INTRAVENOUS

## 2013-07-14 MED ORDER — MORPHINE SULFATE 2 MG/ML IJ SOLN: 1.0000 mg | INTRAMUSCULAR | Status: DC | PRN

## 2013-07-14 MED ORDER — MIDAZOLAM HCL 5 MG/5ML IJ SOLN
INTRAMUSCULAR | Status: DC | PRN
  Administered 2013-07-14: 2 mg via INTRAVENOUS

## 2013-07-14 MED ORDER — LACTATED RINGERS IV SOLN
INTRAVENOUS | Status: DC
  Administered 2013-07-14: 13:00:00 via INTRAVENOUS
  Administered 2013-07-14: 1000 mL via INTRAVENOUS

## 2013-07-14 MED ORDER — BUPIVACAINE-EPINEPHRINE PF 0.25-1:200000 % IJ SOLN
INTRAMUSCULAR | Status: AC
  Filled 2013-07-14: qty 30

## 2013-07-14 MED ORDER — SODIUM CHLORIDE 0.9 % IV SOLN
INTRAVENOUS | Status: AC
  Filled 2013-07-14: qty 1

## 2013-07-14 MED ORDER — HYDROCODONE-ACETAMINOPHEN 5-325 MG PO TABS: 1.0000 | ORAL_TABLET | ORAL | Status: DC | PRN

## 2013-07-14 MED ORDER — LIDOCAINE HCL (CARDIAC) 20 MG/ML IV SOLN
INTRAVENOUS | Status: DC | PRN
  Administered 2013-07-14: 100 mg via INTRAVENOUS

## 2013-07-14 MED ORDER — FENTANYL CITRATE 0.05 MG/ML IJ SOLN: 25.0000 ug | INTRAMUSCULAR | Status: DC | PRN

## 2013-07-14 MED ORDER — MUPIROCIN 2 % EX OINT
1.0000 "application " | TOPICAL_OINTMENT | Freq: Two times a day (BID) | CUTANEOUS | Status: DC
  Administered 2013-07-15 – 2013-07-16 (×3): 1 via NASAL
  Filled 2013-07-14: qty 22

## 2013-07-14 MED ORDER — DEXAMETHASONE SODIUM PHOSPHATE 10 MG/ML IJ SOLN
INTRAMUSCULAR | Status: DC | PRN
  Administered 2013-07-14: 10 mg via INTRAVENOUS

## 2013-07-14 MED ORDER — IOHEXOL 300 MG/ML  SOLN: INTRAMUSCULAR | Status: DC | PRN

## 2013-07-14 MED ORDER — GLYCOPYRROLATE 0.2 MG/ML IJ SOLN
INTRAMUSCULAR | Status: DC | PRN
  Administered 2013-07-14: 0.6 mg via INTRAVENOUS

## 2013-07-14 MED ORDER — FENTANYL CITRATE 0.05 MG/ML IJ SOLN
INTRAMUSCULAR | Status: DC | PRN
  Administered 2013-07-14 (×4): 50 ug via INTRAVENOUS

## 2013-07-14 MED ORDER — PROMETHAZINE HCL 25 MG/ML IJ SOLN: 6.2500 mg | INTRAMUSCULAR | Status: DC | PRN

## 2013-07-14 MED ORDER — NEOSTIGMINE METHYLSULFATE 1 MG/ML IJ SOLN
INTRAMUSCULAR | Status: DC | PRN
  Administered 2013-07-14: 5 mg via INTRAVENOUS

## 2013-07-14 MED ORDER — ROCURONIUM BROMIDE 100 MG/10ML IV SOLN
INTRAVENOUS | Status: DC | PRN
  Administered 2013-07-14: 30 mg via INTRAVENOUS

## 2013-07-14 MED ORDER — PROPOFOL 10 MG/ML IV BOLUS
INTRAVENOUS | Status: DC | PRN
  Administered 2013-07-14: 170 mg via INTRAVENOUS

## 2013-07-14 MED ORDER — ONDANSETRON HCL 4 MG/2ML IJ SOLN
INTRAMUSCULAR | Status: DC | PRN
  Administered 2013-07-14: 4 mg via INTRAVENOUS

## 2013-07-14 MED ORDER — SODIUM CHLORIDE 0.9 % IR SOLN
Status: DC | PRN
  Administered 2013-07-14: 1000 mL

## 2013-07-14 MED ORDER — MEPERIDINE HCL 50 MG/ML IJ SOLN: 6.2500 mg | INTRAMUSCULAR | Status: DC | PRN

## 2013-07-14 MED ORDER — LIDOCAINE HCL 1 % IJ SOLN
INTRAMUSCULAR | Status: AC
  Filled 2013-07-14: qty 20

## 2013-07-14 MED ORDER — CHLORHEXIDINE GLUCONATE CLOTH 2 % EX PADS
6.0000 | MEDICATED_PAD | Freq: Every day | CUTANEOUS | Status: DC
  Administered 2013-07-15 – 2013-07-16 (×2): 6 via TOPICAL

## 2013-07-14 MED ORDER — LACTATED RINGERS IR SOLN
Status: DC | PRN
  Administered 2013-07-14 (×2): 1000 mL

## 2013-07-14 MED ORDER — HEPARIN SODIUM (PORCINE) 5000 UNIT/ML IJ SOLN
5000.0000 [IU] | Freq: Three times a day (TID) | INTRAMUSCULAR | Status: DC
  Administered 2013-07-15 – 2013-07-16 (×4): 5000 [IU] via SUBCUTANEOUS
  Filled 2013-07-14 (×6): qty 1

## 2013-07-14 MED ORDER — SUCCINYLCHOLINE CHLORIDE 20 MG/ML IJ SOLN
INTRAMUSCULAR | Status: DC | PRN
  Administered 2013-07-14: 100 mg via INTRAVENOUS

## 2013-07-14 SURGICAL SUPPLY — 50 items
ADH SKN CLS APL DERMABOND .7 (GAUZE/BANDAGES/DRESSINGS) ×1
APPLIER CLIP ROT 10 11.4 M/L (STAPLE) ×2
APR CLP MED LRG 11.4X10 (STAPLE) ×1
BAG SPEC RTRVL LRG 6X4 10 (ENDOMECHANICALS) ×1
CANISTER SUCTION 2500CC (MISCELLANEOUS) ×2 IMPLANT
CHLORAPREP W/TINT 26ML (MISCELLANEOUS) ×2 IMPLANT
CLIP APPLIE ROT 10 11.4 M/L (STAPLE) ×1 IMPLANT
CLOTH BEACON ORANGE TIMEOUT ST (SAFETY) ×2 IMPLANT
CONT SPECI 4OZ STER CLIK (MISCELLANEOUS) IMPLANT
COVER MAYO STAND STRL (DRAPES) IMPLANT
DECANTER SPIKE VIAL GLASS SM (MISCELLANEOUS) ×2 IMPLANT
DERMABOND ADVANCED (GAUZE/BANDAGES/DRESSINGS) ×1
DERMABOND ADVANCED .7 DNX12 (GAUZE/BANDAGES/DRESSINGS) ×1 IMPLANT
DRAIN CHANNEL 19F RND (DRAIN) ×1 IMPLANT
DRAPE C-ARM 42X120 X-RAY (DRAPES) IMPLANT
DRAPE LAPAROSCOPIC ABDOMINAL (DRAPES) ×2 IMPLANT
DRAPE WARM FLUID 44X44 (DRAPE) ×2 IMPLANT
ELECT REM PT RETURN 9FT ADLT (ELECTROSURGICAL) ×2
ELECTRODE REM PT RTRN 9FT ADLT (ELECTROSURGICAL) ×1 IMPLANT
ENDOLOOP SUT PDS II  0 18 (SUTURE) ×2
ENDOLOOP SUT PDS II 0 18 (SUTURE) IMPLANT
EVACUATOR DRAINAGE 10X20 100CC (DRAIN) IMPLANT
EVACUATOR SILICONE 100CC (DRAIN) ×2
GAUZE SPONGE 4X4 16PLY XRAY LF (GAUZE/BANDAGES/DRESSINGS) ×1 IMPLANT
GLOVE BIO SURGEON STRL SZ 6 (GLOVE) ×4 IMPLANT
GLOVE BIOGEL PI IND STRL 7.0 (GLOVE) ×1 IMPLANT
GLOVE BIOGEL PI INDICATOR 7.0 (GLOVE) ×1
GLOVE INDICATOR 6.5 STRL GRN (GLOVE) ×4 IMPLANT
GOWN PREVENTION PLUS LG XLONG (DISPOSABLE) IMPLANT
GOWN PREVENTION PLUS XLARGE (GOWN DISPOSABLE) IMPLANT
GOWN PREVENTION PLUS XXLARGE (GOWN DISPOSABLE) ×2 IMPLANT
HEMOSTAT SNOW SURGICEL 2X4 (HEMOSTASIS) ×1 IMPLANT
HEMOSTAT SURGICEL 4X8 (HEMOSTASIS) IMPLANT
KIT BASIN OR (CUSTOM PROCEDURE TRAY) ×2 IMPLANT
POUCH SPECIMEN RETRIEVAL 10MM (ENDOMECHANICALS) ×2 IMPLANT
SCISSORS LAP 5X45 EPIX DISP (ENDOMECHANICALS) ×1 IMPLANT
SET CHOLANGIOGRAPH MIX (MISCELLANEOUS) IMPLANT
SET IRRIG TUBING LAPAROSCOPIC (IRRIGATION / IRRIGATOR) ×2 IMPLANT
SOLUTION ANTI FOG 6CC (MISCELLANEOUS) ×2 IMPLANT
SPONGE DRAIN TRACH 4X4 STRL 2S (GAUZE/BANDAGES/DRESSINGS) ×1 IMPLANT
SUT ETHILON 2 0 PS N (SUTURE) ×1 IMPLANT
SUT MNCRL AB 4-0 PS2 18 (SUTURE) ×2 IMPLANT
SUT VICRYL 0 UR6 27IN ABS (SUTURE) ×1 IMPLANT
TAPE CLOTH SURG 4X10 WHT LF (GAUZE/BANDAGES/DRESSINGS) ×1 IMPLANT
TOWEL OR 17X26 10 PK STRL BLUE (TOWEL DISPOSABLE) ×4 IMPLANT
TRAY LAP CHOLE (CUSTOM PROCEDURE TRAY) ×2 IMPLANT
TROCAR BLADELESS OPT 5 75 (ENDOMECHANICALS) ×4 IMPLANT
TROCAR XCEL BLUNT TIP 100MML (ENDOMECHANICALS) ×2 IMPLANT
TROCAR XCEL NON-BLD 11X100MML (ENDOMECHANICALS) ×2 IMPLANT
TUBING INSUFFLATION 10FT LAP (TUBING) ×2 IMPLANT

## 2013-07-14 NOTE — Progress Notes (Signed)
Transfer to [facility] by Melburn Hake. Wife at bedside

## 2013-07-14 NOTE — Op Note (Signed)
Laparoscopic Cholecystectomy Procedure Note  Indications: This patient presents with acute cholecystitis with calculus and will undergo laparoscopic cholecystectomy.  Pre-operative Diagnosis: same  Post-operative Diagnosis: Same  Surgeon: Almond Lint   Assistants: n/a  Anesthesia: General endotracheal anesthesia and local  Procedure Details  The patient was seen again in the Holding Room. The risks, benefits, complications, treatment options, and expected outcomes were discussed with the patient. The possibilities of  bleeding, recurrent infection, damage to nearby structures, the need for additional procedures, failure to diagnose a condition, the possible need to convert to an open procedure, and creating a complication requiring transfusion or operation were discussed with the patient. The likelihood of improving the patient's symptoms with return to their baseline status is good.    The patient and/or family concurred with the proposed plan, giving informed consent. The site of surgery properly noted. The patient was taken to Operating Room, and the procedure verified as Laparoscopic Cholecystectomy with possible Intraoperative Cholangiogram. A Time Out was held and the above information confirmed.  Prior to the induction of general anesthesia, antibiotic prophylaxis was administered. General endotracheal anesthesia was then administered and tolerated well. After the induction, the abdomen was prepped with Chloraprep and draped in the sterile fashion. The patient was positioned in the supine position.  Local anesthetic agent was injected into the skin near the umbilicus and an incision made. We dissected down to the abdominal fascia with blunt dissection.  The fascia was incised vertically and we entered the peritoneal cavity bluntly.  A pursestring suture of 0-Vicryl was placed around the fascial opening.  The Hasson cannula was inserted and secured with the stay suture.  Pneumoperitoneum  was then created with CO2 and tolerated well without any adverse changes in the patient's vital signs. An 11-mm port was placed in the subxiphoid position.  Two 5-mm ports were placed in the right upper quadrant. All skin incisions were infiltrated with a local anesthetic agent before making the incision and placing the trocars.   We positioned the patient in reverse Trendelenburg, tilted slightly to the patient's left.  The gallbladder was identified, the fundus grasped and retracted cephalad. Adhesions were lysed bluntly and with the electrocautery where indicated, taking care not to injure any adjacent organs or viscus. The gallbladder required aspiration with the Nezhat in order to be able to grasp it.  The infundibulum was grasped and retracted laterally, exposing the peritoneum overlying the triangle of Calot. This was then divided and exposed in a blunt fashion. Hydrodissection was used.  The gallbladder was extremely inflamed.   The cystic artery was pulsatile and was clipped.  A critical view of the cystic duct was obtained.  The cystic duct was identified and bluntly dissected circumferentially. The cystic duct was ligated with a clip distally.  The duct was too short and friable to perform cholangiogram.    An incision was made in the cystic duct and many stones came out.  The stones were milked out.  1 PDS endoloop was placed on the cystic duct.    The gallbladder was dissected from the liver bed in retrograde fashion with the electrocautery. The gallbladder was removed and placed in an Endocatch bag.  The gallbladder and Endocatch bag were then removed through the umbilical port site.  The fascial and skin incision had to be enlarged to remove the gallbladder.  The liver bed was irrigated and inspected. Hemostasis was achieved with the electrocautery. A second endoloop was placed on the cystic duct.   Copious irrigation  was utilized and was repeatedly aspirated until clear.  SNOW hemostatic agent  was applied.  A 19 fr blake drain was placed in the gallbladder fossa and secured with a 2-0 nylon.    We again inspected the right upper quadrant for hemostasis.  Pneumoperitoneum was released as we removed the trocars.   The pursestring suture was used to close the umbilical fascia.  A second 0-0 vicryl was used to close down the defect.  4-0 Monocryl was used to close the skin.   The skin was cleaned and dry, and Dermabond was applied. The patient was then extubated and brought to the recovery room in stable condition. Instrument, sponge, and needle counts were correct at closure and at the conclusion of the case.   Findings: Severe acute cholecystitis with innumerable stones.    Estimated Blood Loss: 50 mL         Drains: 19 Fr blake drain.            Specimens: Gallbladder to pathology       Complications: None; patient tolerated the procedure well.         Disposition: PACU - hemodynamically stable.         Condition: stable

## 2013-07-14 NOTE — Anesthesia Postprocedure Evaluation (Signed)
  Anesthesia Post-op Note  Patient: Jared [name]  Procedure(s) Performed: Procedure(s) (LRB): LAPAROSCOPIC CHOLECYSTECTOMY (N/A)  Patient Location: PACU  Anesthesia Type: General  Level of Consciousness: awake and alert   Airway and Oxygen Therapy: Patient Spontanous Breathing  Post-op Pain: mild  Post-op Assessment: Post-op Vital signs reviewed, Patient's Cardiovascular Status Stable, Respiratory Function Stable, Patent Airway and No signs of Nausea or vomiting  Last Vitals:  Filed Vitals:   07/14/13 1500  BP: 126/74  Pulse: 58  Temp: 37.2 C  Resp: 20    Post-op Vital Signs: stable   Complications: No apparent anesthesia complications

## 2013-07-14 NOTE — Transfer of Care (Signed)
Immediate Anesthesia Transfer of Care Note  Patient: Jared [name]  Procedure(s) Performed: Procedure(s): LAPAROSCOPIC CHOLECYSTECTOMY (N/A)  Patient Location: PACU  Anesthesia Type:General  Level of Consciousness: sedated  Airway & Oxygen Therapy: Patient Spontanous Breathing and Patient connected to face mask oxygen  Post-op Assessment: Report given to PACU RN and Post -op Vital signs reviewed and stable  Post vital signs: Reviewed and stable  Complications: No apparent anesthesia complications

## 2013-07-14 NOTE — Progress Notes (Signed)
The surgical procedure was described to the patient in detail. I advised of the risks of bleeding, infection, damage to other structures (such as the bile duct, intestine or liver), bile leak, need for other procedures or surgeries, and post op diarrhea/constipation.  We discussed the risk of blood clot.  We discussed the recovery period and post operative restrictions.

## 2013-07-14 NOTE — Progress Notes (Signed)
Patient ID: Jared [name], male   DOB: Mar 25, 1946, 67 y.o.   MRN: [id number]    Subjective: The patient had some nausea this morning.  Otherwise feels ok.  Objective: Vital signs in last 24 hours: Temp:  [97.8 F (36.6 C)-99.3 F (37.4 C)] 97.8 F (36.6 C) (09/16 0513) Pulse Rate:  [50-55] 55 (09/16 0513) Resp:  [16-20] 19 (09/16 0513) BP: (112-133)/(56-72) 124/72 mmHg (09/16 0513) SpO2:  [98 %-100 %] 99 % (09/16 0513) Last BM Date: 06/20/13 (per pt)  Intake/Output from previous day:   Intake/Output this shift:    PE: Abd: soft, mild RUQ tenderness, ND  Lab Results:   Recent Labs  07/13/13 0232 07/14/13 0550  WBC 10.4 13.4*  HGB 13.1 13.3  HCT 38.1* 37.7*  PLT 252 280   BMET  Recent Labs  07/13/13 0232 07/14/13 0550  NA 135 138  K 3.7 3.7  CL 99 102  CO2 21 23  GLUCOSE 138* 111*  BUN 11 9  CREATININE 0.73 0.74  CALCIUM 8.7 8.6   PT/INR No results found for this basename: LABPROT, INR,  in the last 72 hours CMP     Component Value Date/Time   NA 138 07/14/2013 0550   K 3.7 07/14/2013 0550   CL 102 07/14/2013 0550   CO2 23 07/14/2013 0550   GLUCOSE 111* 07/14/2013 0550   BUN 9 07/14/2013 0550   CREATININE 0.74 07/14/2013 0550   CREATININE 0.93 02/27/2012 0929   CALCIUM 8.6 07/14/2013 0550   PROT 6.5 07/13/2013 0232   ALBUMIN 3.2* 07/13/2013 0232   AST 12 07/13/2013 0232   ALT 9 07/13/2013 0232   ALKPHOS 66 07/13/2013 0232   BILITOT 0.8 07/13/2013 0232   GFRNONAA >90 07/14/2013 0550   GFRAA >90 07/14/2013 0550   Lipase     Component Value Date/Time   LIPASE 46 07/13/2013 0232       Studies/Results: Nm Hepatobiliary  07/13/2013   *RADIOLOGY REPORT*  Clinical Data: Right-sided abdominal pain with nausea and vomiting.  NUCLEAR MEDICINE HEPATOHBILIARY INCLUDE GB  Radiopharmaceutical:  5 mCi technetium Choletec IV  Comparison: CT scan dated 07/13/2013  Findings: Activity is seen in the biliary tree at 15 minutes and within the bowel at 25 minutes.  The  gallbladder was not visualized at 60 minutes.  Therefore, the patient was given 4 mg of morphine IV.  Additional imaging was carried out for 30 minutes and the gallbladder was still not visualized.  IMPRESSION: Abnormal hepatobiliary scan consistent with cystic duct obstruction and acute cholecystitis.   Original Report Authenticated By: Francene Boyers, M.D.   Ct Abdomen Pelvis W Contrast  07/13/2013   *RADIOLOGY REPORT*  Clinical Data: Abdominal pain  CT ABDOMEN AND PELVIS WITH CONTRAST  Technique:  Multidetector CT imaging of the abdomen and pelvis was performed following the standard protocol during bolus administration of intravenous contrast.  Contrast: OMNIPAQUE IOHEXOL 300 MG/ML  SOLN  Comparison: prior CT from 01/22/2013.  Findings:  Bibasilar atelectasis is present.  The liver is normal.  There is mild periportal edema.  Small gallstone is noted within the gallbladder lumen.  The gallbladder is distended and hydropic in appearance with associated pericholecystic fat stranding.  The common bile duct is prominent measuring 8 mm, similar to prior.  The main pancreatic duct is also prominent measuring approximately 5 mm.  The spleen, adrenal glands are unremarkable.  The kidneys are normal.  There is no evidence of bowel obstruction.  Multiple metallic Jean Rosenthal again seen  within the lower abdomen. Inflammatory fat stranding is seen within the right lower quadrant without definite loculated fluid collection.  Overall, these findings are improved as compared to the prior examination.  No free intraperitoneal air identified.  The colon is otherwise unremarkable.  Bladder is normal. The the patient is status post prostatectomy. 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  The hernia the right is larger than the left main has a small amount of fluid internally.  Degenerative changes are again noted within the lumbar spine.  No acute osseous abnormality identified.  IMPRESSION:  1. Persistent  inflammatory changes within the right lower quadrant about the cecum.  Overall, these findings are not as severe as compared to the prior CT from 01/22/2013. Underlying neoplasm is not excluded. 2.  Distended gallbladder with mild pericholecystic fat stranding. Findings may be secondary to the right lower quadrant inflammatory process, although possible acute cholecystitis is not excluded. Correlation with right upper quadrant ultrasound could be performed as clinically indicated.   Original Report Authenticated By: Rise Mu, M.D.    Anti-infectives: Anti-infectives   Start     Dose/Rate Route Frequency Ordered Stop   07/13/13 1200  ertapenem (INVANZ) 1 g in sodium chloride 0.9 % 50 mL IVPB     1 g 100 mL/hr over 30 Minutes Intravenous Every 24 hours 07/13/13 1033         Assessment/Plan  1. Acute cholecystitis 2. H/o cecal colitis 3. Chronic kidney disease 4. Prostate cancer  Plan: 1. Dr. Donell Beers has evaluated the patient with me.  We will plan on transfer to [facility] today for surgical intervention as our room here is full today of emergency cases.  We have thoroughly discussed this with the patient and his wife on the phone.  They both understand and agree with this plan.  Dr. Donell Beers has discussed the procedure along with risks and complications as well as expected outcome.  He understands.    LOS: 1 day    Bobetta Korf E 07/14/2013, 9:32 AM Pager: [phone]

## 2013-07-14 NOTE — Anesthesia Preprocedure Evaluation (Addendum)
Anesthesia Evaluation  Patient identified by MRN, date of birth, ID band Patient awake    Reviewed: Allergy & Precautions, H&P , NPO status , Patient's Chart, lab work & pertinent test results  Airway Mallampati: II TM Distance: >3 FB Neck ROM: Full    Dental no notable dental hx.    Pulmonary neg pulmonary ROS,  breath sounds clear to auscultation  Pulmonary exam normal       Cardiovascular negative cardio ROS  Rhythm:Regular Rate:Normal     Neuro/Psych negative neurological ROS  negative psych ROS   GI/Hepatic negative GI ROS, Neg liver ROS,   Endo/Other  negative endocrine ROS  Renal/GU negative Renal ROS  negative genitourinary   Musculoskeletal negative musculoskeletal ROS (+)   Abdominal   Peds negative pediatric ROS (+)  Hematology negative hematology ROS (+)   Anesthesia Other Findings Multiple caps upper front  Reproductive/Obstetrics negative OB ROS                          Anesthesia Physical Anesthesia Plan  ASA: II  Anesthesia Plan: General   Post-op Pain Management:    Induction: Intravenous  Airway Management Planned: Oral ETT  Additional Equipment:   Intra-op Plan:   Post-operative Plan: Extubation in OR  Informed Consent: I have reviewed the patients History and Physical, chart, labs and discussed the procedure including the risks, benefits and alternatives for the proposed anesthesia with the patient or authorized representative who has indicated his/her understanding and acceptance.   Dental advisory given  Plan Discussed with: CRNA  Anesthesia Plan Comments:         Anesthesia Quick Evaluation

## 2013-07-15 ENCOUNTER — Encounter: Payer: Self-pay | Admitting: Family Medicine

## 2013-07-15 LAB — CBC WITH DIFFERENTIAL/PLATELET
Eosinophils Absolute: 0 10*3/uL (ref 0.0–0.7)
Eosinophils Relative: 0 % (ref 0–5)
HCT: 37.4 % — ABNORMAL LOW (ref 39.0–52.0)
Lymphocytes Relative: 6 % — ABNORMAL LOW (ref 12–46)
Lymphs Abs: 0.8 10*3/uL (ref 0.7–4.0)
MCH: 27.6 pg (ref 26.0–34.0)
MCV: 80.8 fL (ref 78.0–100.0)
Monocytes Absolute: 1.6 10*3/uL — ABNORMAL HIGH (ref 0.1–1.0)
Platelets: 275 10*3/uL (ref 150–400)
RDW: 15.6 % — ABNORMAL HIGH (ref 11.5–15.5)

## 2013-07-15 LAB — COMPREHENSIVE METABOLIC PANEL
AST: 61 U/L — ABNORMAL HIGH (ref 0–37)
Albumin: 2.6 g/dL — ABNORMAL LOW (ref 3.5–5.2)
Alkaline Phosphatase: 108 U/L (ref 39–117)
Chloride: 103 mEq/L (ref 96–112)
Potassium: 3.7 mEq/L (ref 3.5–5.1)
Sodium: 137 mEq/L (ref 135–145)
Total Bilirubin: 0.5 mg/dL (ref 0.3–1.2)
Total Protein: 6 g/dL (ref 6.0–8.3)

## 2013-07-15 MED ORDER — POLYETHYLENE GLYCOL 3350 17 G PO PACK
17.0000 g | PACK | Freq: Every day | ORAL | Status: DC
  Administered 2013-07-15: 17 g via ORAL
  Filled 2013-07-15 (×2): qty 1

## 2013-07-15 MED ORDER — ALUM & MAG HYDROXIDE-SIMETH 200-200-20 MG/5ML PO SUSP
30.0000 mL | ORAL | Status: DC | PRN
Start: 2013-07-15 — End: 2013-07-16
  Administered 2013-07-15: 30 mL via ORAL
  Filled 2013-07-15: qty 30

## 2013-07-15 MED ORDER — FAMOTIDINE 20 MG PO TABS
20.0000 mg | ORAL_TABLET | Freq: Two times a day (BID) | ORAL | Status: DC
  Administered 2013-07-15 – 2013-07-16 (×2): 20 mg via ORAL
  Filled 2013-07-15 (×4): qty 1

## 2013-07-15 MED ORDER — SODIUM CHLORIDE 0.9 % IV SOLN
1000.0000 mL | INTRAVENOUS | Status: DC
  Administered 2013-07-15 – 2013-07-16 (×2): 1000 mL via INTRAVENOUS

## 2013-07-15 MED ORDER — FLEET ENEMA 7-19 GM/118ML RE ENEM: 1.0000 | ENEMA | Freq: Every day | RECTAL | Status: DC | PRN

## 2013-07-15 NOTE — Progress Notes (Signed)
Seen, examined. S/p lap chole for severe acute cholecystitis.    Drain without bile.  + ileus. Slow advance of diet, home probably tomorrow.

## 2013-07-15 NOTE — Progress Notes (Signed)
Pt reports constipation since 8/23 with nephrolithiasis.  Then gallbladder pain.  We will try some Miralax and enema PRN.  We gave him something for indigestion, earlier.

## 2013-07-15 NOTE — Progress Notes (Signed)
1 Day Post-Op  Subjective: Pt has been up and ambulating.  Little pain alleviated with meds.  No flatus or bm yet.  Voiding without difficulties.  Denies n/v.  Objective: Vital signs in last 24 hours: Temp:  [98 F (36.7 C)-99 F (37.2 C)] 98.2 F (36.8 C) (09/17 0557) Pulse Rate:  [56-67] 67 (09/17 0557) Resp:  [17-22] 18 (09/17 0557) BP: (100-141)/(61-85) 100/61 mmHg (09/17 0557) SpO2:  [95 %-100 %] 95 % (09/17 0557) Last BM Date: 07/12/13  Intake/Output from previous day: 09/16 0701 - 09/17 0700 In: 1500 [I.V.:1500] Out: 1985 [Urine:1500; Drains:485] Intake/Output this shift:    General appearance: alert, cooperative and no distress Resp: clear to auscultation bilaterally Cardio: regular rate and rhythm, S1, S2 normal, no murmur, click, rub or gallop GI: hypoactive bowel sounds, mild distention, appropriately tender.  Incisions are dry and intact. RUQ drain, serosanguinous drainage on dressing, serosanguinous drainage in drain, no evidence of bile.  No evidence of peritonitis. Extremities: extremities normal, atraumatic, no cyanosis or edema  Lab Results:   Recent Labs  07/14/13 0550 07/15/13 0410  WBC 13.4* 13.6*  HGB 13.3 12.8*  HCT 37.7* 37.4*  PLT 280 275   BMET  Recent Labs  07/14/13 0550 07/15/13 0410  NA 138 137  K 3.7 3.7  CL 102 103  CO2 23 24  GLUCOSE 111* 124*  BUN 9 10  CREATININE 0.74 0.66  CALCIUM 8.6 8.5   Studies/Results: Nm Hepatobiliary  07/13/2013   *RADIOLOGY REPORT*  Clinical Data: Right-sided abdominal pain with nausea and vomiting.  NUCLEAR MEDICINE HEPATOHBILIARY INCLUDE GB  Radiopharmaceutical:  5 mCi technetium Choletec IV  Comparison: CT scan dated 07/13/2013  Findings: Activity is seen in the biliary tree at 15 minutes and within the bowel at 25 minutes.  The gallbladder was not visualized at 60 minutes.  Therefore, the patient was given 4 mg of morphine IV.  Additional imaging was carried out for 30 minutes and the gallbladder  was still not visualized.  IMPRESSION: Abnormal hepatobiliary scan consistent with cystic duct obstruction and acute cholecystitis.   Original Report Authenticated By: Francene Boyers, M.D.    Anti-infectives: Anti-infectives   Start     Dose/Rate Route Frequency Ordered Stop   07/13/13 1200  ertapenem (INVANZ) 1 g in sodium chloride 0.9 % 50 mL IVPB     1 g 100 mL/hr over 30 Minutes Intravenous Every 24 hours 07/13/13 1033        Assessment/Plan: 1.Acute cholecystitis  2. H/o cecal colitis  3. Chronic kidney disease  4. Prostate cancer -await bowel function, continue with clear liquid diet -continue with IV hydration until improved oral intake -RUQ drain 427ml/24h serosanguinous output.  PRN dressing change -ambulate as tolerated, SCDs, heparin -IS -Invanz day #3 -pain control.  He has a history of constipation, he will need stool softeners and/or laxatives once bowel function returns to help minimize symptoms with opiate use   LOS: 2 days    Bonner Puna [facility] ANP-BC Pager [phone]  07/15/2013 7:52 AM

## 2013-07-16 LAB — CBC
HCT: 35.9 % — ABNORMAL LOW (ref 39.0–52.0)
MCV: 81 fL (ref 78.0–100.0)
RBC: 4.43 MIL/uL (ref 4.22–5.81)
WBC: 7.1 10*3/uL (ref 4.0–10.5)

## 2013-07-16 MED ORDER — HYDROCODONE-ACETAMINOPHEN 5-325 MG PO TABS: 1.0000 | ORAL_TABLET | Freq: Four times a day (QID) | ORAL | Status: DC | PRN

## 2013-07-16 MED ORDER — AMOXICILLIN-POT CLAVULANATE 875-125 MG PO TABS
1.0000 | ORAL_TABLET | Freq: Two times a day (BID) | ORAL | Status: DC
  Filled 2013-07-16: qty 1

## 2013-07-16 MED ORDER — MAGNESIUM HYDROXIDE 400 MG/5ML PO SUSP
960.0000 mL | Freq: Once | ORAL | Status: AC
  Administered 2013-07-16: 960 mL via RECTAL
  Filled 2013-07-16: qty 240

## 2013-07-16 MED ORDER — ACETAMINOPHEN 325 MG PO TABS: ORAL_TABLET | ORAL | Status: DC

## 2013-07-16 MED ORDER — SORBITOL 70 % SOLN
960.0000 mL | TOPICAL_OIL | Freq: Once | ORAL | Status: DC | PRN
  Filled 2013-07-16: qty 240

## 2013-07-16 MED ORDER — ONDANSETRON 4 MG PO TBDP: 4.0000 mg | ORAL_TABLET | Freq: Three times a day (TID) | ORAL | Status: DC | PRN

## 2013-07-16 MED ORDER — AMOXICILLIN-POT CLAVULANATE 875-125 MG PO TABS: 1.0000 | ORAL_TABLET | Freq: Two times a day (BID) | ORAL | Status: DC

## 2013-07-16 MED ORDER — POLYETHYLENE GLYCOL 3350 17 G PO PACK: PACK | ORAL | Status: DC

## 2013-07-16 MED ORDER — ONDANSETRON 4 MG PO TBDP
4.0000 mg | ORAL_TABLET | Freq: Three times a day (TID) | ORAL | Status: DC | PRN
  Filled 2013-07-16: qty 1

## 2013-07-16 NOTE — Progress Notes (Signed)
Seen, agree with above.   

## 2013-07-16 NOTE — Discharge Summary (Signed)
Physician Discharge Summary  Jared [name] ZOX:[id number] DOB: Sep 03, 1946 DOA: 07/13/2013  PCP: Carney Living, MD  Consultation: none  Admit date: 07/13/2013 Discharge date: 07/16/2013  Recommendations for Outpatient Follow-up:   Follow-up Information   Follow up with [facility], MD In 2 weeks.   Specialty:  General Surgery   Contact information:   [address] [place] [address] [phone]      Discharge Diagnoses:  1. Acute cholecystitis   Surgical Procedure: Laparoscopic Cholecystectomy 07/14/13  Discharge Condition: stable Disposition: home  Diet recommendation: regular  Filed Weights   07/13/13 0813  Weight: 217 lb 9.5 oz (98.7 kg)     Hospital Course:  Jared [name] is a 67 year old male with a history of prostate CA s/p prostatectomy, CKD who presented to [facility] with vague mid abdominal pain associated with nausea and vomiting.  His work up showed possible acute cholecystitis, he was admitted for IV antibiotics. He was transferred to [facility] due to surgeon availability.  He underwent a laparoscopic cholecystectomy with JP drain placement on 07/14/13.  On POD #1 he had significant pain, distention.  He was started on a laxative and progressed.  On POD #2 he complained on ongoing constipation for which he was given a SMOG enema which resulted in several bowel movement.  His diet was advanced.  Pain under better control with oral medication.  He was voiding and ambulating in hallways.  Lastly, JP drain putting out serosanguinous output.  He was taught and given instructions for drain care which will remain until his follow up with Dr. Donell Beers.  He was therefore felt stable for discharge.  Her was given oral antibiotics, pain medication.  He knows to call and schedule a follow up in 2 weeks.  He was encouraged to call sooner with questions or concerns.  He may take zofran and miralax PRN for nausea and constiopation.     Discharge Instructions      Medication List    ASK your doctor about these medications       aspirin 325 MG tablet  Take 325 mg by mouth daily.     B-100 COMPLEX Tabs  Take 1 tablet by mouth daily.     bimatoprost 0.01 % Soln  Commonly known as:  LUMIGAN  Place 1 drop into the left eye at bedtime.     brinzolamide 1 % ophthalmic suspension  Commonly known as:  AZOPT  Place 1 drop into the left eye 3 (three) times daily.     CALCIUM PO  Take 1 tablet by mouth 2 (two) times daily. 800mg      CENTRUM SILVER PO  Take 1 tablet by mouth daily.     Garlic 1000 MG Caps  Take 1 capsule by mouth daily.     HYDROcodone-acetaminophen 5-325 MG per tablet  Commonly known as:  NORCO/VICODIN  Take 2 tablets by mouth every 4 (four) hours as needed for pain.     timolol 0.5 % ophthalmic solution  Commonly known as:  TIMOPTIC  Place 1 drop into the left eye daily.     traMADol 50 MG tablet  Commonly known as:  ULTRAM  Take 1 tablet (50 mg total) by mouth every 6 (six) hours as needed for pain.     triptorelin 11.25 MG injection  Commonly known as:  TRELESTAR LA  Inject 11.25 mg into the muscle every 3 (three) months. Take injections quarterly     vitamin C 500 MG tablet  Commonly known as:  ASCORBIC ACID  Take 500 mg by mouth daily.     Vitamin D-3 1000 UNITS Caps  Take 1 capsule by mouth daily.           Follow-up Information   Follow up with [facility], MD In 2 weeks.   Specialty:  General Surgery   Contact information:   [address] [place] [address] [phone]        The results of significant diagnostics from this hospitalization (including imaging, microbiology, ancillary and laboratory) are listed below for reference.    Significant Diagnostic Studies: Dg Abd 1 View  06/21/2013   *RADIOLOGY REPORT*  Clinical Data: Pelvic pain for 5 days with hematuria, history kidney stones, prostate cancer  ABDOMEN - 1 VIEW  Comparison: None Correlation:  CT abdomen pelvis  01/22/2013  Findings: Surgical clips in pelvis bilaterally due to prostatectomy. Normal bowel gas pattern. No bowel dilatation or bowel wall thickening. 2 mm diameter calcification projects over the inferior pole of the right kidney. Degenerative disk disease changes lumbar spine.  IMPRESSION: 2 mm right renal calculus. No definite ureteral calcification.   Original Report Authenticated By: Ulyses Southward, M.D.   Nm Hepatobiliary  07/13/2013   *RADIOLOGY REPORT*  Clinical Data: Right-sided abdominal pain with nausea and vomiting.  NUCLEAR MEDICINE HEPATOHBILIARY INCLUDE GB  Radiopharmaceutical:  5 mCi technetium Choletec IV  Comparison: CT scan dated 07/13/2013  Findings: Activity is seen in the biliary tree at 15 minutes and within the bowel at 25 minutes.  The gallbladder was not visualized at 60 minutes.  Therefore, the patient was given 4 mg of morphine IV.  Additional imaging was carried out for 30 minutes and the gallbladder was still not visualized.  IMPRESSION: Abnormal hepatobiliary scan consistent with cystic duct obstruction and acute cholecystitis.   Original Report Authenticated By: Francene Boyers, M.D.   Ct Abdomen Pelvis W Contrast  07/13/2013   *RADIOLOGY REPORT*  Clinical Data: Abdominal pain  CT ABDOMEN AND PELVIS WITH CONTRAST  Technique:  Multidetector CT imaging of the abdomen and pelvis was performed following the standard protocol during bolus administration of intravenous contrast.  Contrast: OMNIPAQUE IOHEXOL 300 MG/ML  SOLN  Comparison: prior CT from 01/22/2013.  Findings:  Bibasilar atelectasis is present.  The liver is normal.  There is mild periportal edema.  Small gallstone is noted within the gallbladder lumen.  The gallbladder is distended and hydropic in appearance with associated pericholecystic fat stranding.  The common bile duct is prominent measuring 8 mm, similar to prior.  The main pancreatic duct is also prominent measuring approximately 5 mm.  The spleen, adrenal  glands are unremarkable.  The kidneys are normal.  There is no evidence of bowel obstruction.  Multiple metallic Jean Rosenthal again seen within the lower abdomen. Inflammatory fat stranding is seen within the right lower quadrant without definite loculated fluid collection.  Overall, these findings are improved as compared to the prior examination.  No free intraperitoneal air identified.  The colon is otherwise unremarkable.  Bladder is normal. The the patient is status post prostatectomy. 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  The hernia the right is larger than the left main has a small amount of fluid internally.  Degenerative changes are again noted within the lumbar spine.  No acute osseous abnormality identified.  IMPRESSION:  1. Persistent inflammatory changes within the right lower quadrant about the cecum.  Overall, these findings are not as severe as compared  to the prior CT from 01/22/2013. Underlying neoplasm is not excluded. 2.  Distended gallbladder with mild pericholecystic fat stranding. Findings may be secondary to the right lower quadrant inflammatory process, although possible acute cholecystitis is not excluded. Correlation with right upper quadrant ultrasound could be performed as clinically indicated.   Original Report Authenticated By: Rise Mu, M.D.    Microbiology: Recent Results (from the past 240 hour(s))  SURGICAL PCR SCREEN     Status: Abnormal   Collection Time    07/14/13 10:30 AM      Result Value Range Status   MRSA, PCR NEGATIVE  NEGATIVE Final   Staphylococcus aureus POSITIVE (*) NEGATIVE Final   Comment:            The Xpert SA Assay (FDA     approved for NASAL specimens     in patients over 76 years of age),     is one component of     a comprehensive surveillance     program.  Test performance has     been validated by [employer] for patients greater     than or equal to 16 year old.     It is not intended     to diagnose  infection nor to     guide or monitor treatment.     Labs: Basic Metabolic Panel:  Recent Labs Lab 07/13/13 0232 07/14/13 0550 07/15/13 0410  NA 135 138 137  K 3.7 3.7 3.7  CL 99 102 103  CO2 21 23 24   GLUCOSE 138* 111* 124*  BUN 11 9 10   CREATININE 0.73 0.74 0.66  CALCIUM 8.7 8.6 8.5   Liver Function Tests:  Recent Labs Lab 07/13/13 0232 07/15/13 0410  AST 12 61*  ALT 9 49  ALKPHOS 66 108  BILITOT 0.8 0.5  PROT 6.5 6.0  ALBUMIN 3.2* 2.6*    Recent Labs Lab 07/13/13 0232  LIPASE 46   No results found for this basename: AMMONIA,  in the last 168 hours CBC:  Recent Labs Lab 07/13/13 0232 07/14/13 0550 07/15/13 0410 07/16/13 0415  WBC 10.4 13.4* 13.6* 7.1  NEUTROABS 8.3*  --  11.2*  --   HGB 13.1 13.3 12.8* 12.2*  HCT 38.1* 37.7* 37.4* 35.9*  MCV 80.9 80.9 80.8 81.0  PLT 252 280 275 295    Active Problems:   Calculus of gallbladder with acute cholecystitis, without mention of obstruction   Signed:  Deklen Popelka ANP-BC

## 2013-07-16 NOTE — Progress Notes (Signed)
Seen, examined, agree with above.  Enema worked.  3 large BMs.    Going to try lunch.  If not nauseated, can go home.

## 2013-07-16 NOTE — Progress Notes (Signed)
2 Days Post-Op  Subjective: Pt concerned with not having a BM, states he is "backed up" causing nausea, poor appetite.    Objective: Vital signs in last 24 hours: Temp:  [98.3 F (36.8 C)-98.6 F (37 C)] 98.5 F (36.9 C) (09/18 0620) Pulse Rate:  [56-69] 69 (09/18 0620) Resp:  [18] 18 (09/18 0620) BP: (115-147)/(70-81) 115/76 mmHg (09/18 0620) SpO2:  [95 %-100 %] 95 % (09/18 0620) Last BM Date: 07/12/13  Intake/Output from previous day: 09/17 0701 - 09/18 0700 In: 2208.8 [P.O.:960; I.V.:1248.8] Out: 2155 [Urine:2050; Drains:105] Intake/Output this shift:   PE General appearance: alert, cooperative and no distress  Resp: clear to auscultation bilaterally  Cardio: regular rate and rhythm, S1, S2 normal, no murmur, click, rub or gallop  GI:+BS soft round and appropriately tender. Incisions are dry and intact. RUQ drain, serosanguinous drainage on dressing, serosanguinous drainage in drain, no evidence of bile. No evidence of peritonitis.  Extremities: extremities normal, atraumatic, no cyanosis or edema  Lab Results:   Recent Labs  07/15/13 0410 07/16/13 0415  WBC 13.6* 7.1  HGB 12.8* 12.2*  HCT 37.4* 35.9*  PLT 275 295   BMET  Recent Labs  07/14/13 0550 07/15/13 0410  NA 138 137  K 3.7 3.7  CL 102 103  CO2 23 24  GLUCOSE 111* 124*  BUN 9 10  CREATININE 0.74 0.66  CALCIUM 8.6 8.5   Anti-infectives: Anti-infectives   Start     Dose/Rate Route Frequency Ordered Stop   07/13/13 1200  ertapenem (INVANZ) 1 g in sodium chloride 0.9 % 50 mL IVPB     1 g 100 mL/hr over 30 Minutes Intravenous Every 24 hours 07/13/13 1033        Assessment/Plan: 1.Acute cholecystitis  2. H/o cecal colitis  3. Chronic kidney disease  4. Prostate cancer -Pt very concerned with having a BM, give SMOG enema -tolerating diet -Pt will go home with drain, nursing to teach drain care -pain control, antiemetics -mobilize -IS -will need 1 week PO augmentin and follow up with Dr.  Donell Beers in 2 weeks -will check on him later after he has a bm and lunch, hopefully send him home today.   LOS: 3 days   Bonner Puna [facility] ANP-BC Pager [phone]  07/16/2013 8:55 AM

## 2013-07-16 NOTE — Progress Notes (Signed)
Patient and wife demonstrated drain care and dressing change. No concerns voiced. Verbalized understanding. Dressing supplies sent with patient.

## 2013-07-17 ENCOUNTER — Encounter (HOSPITAL_COMMUNITY): Payer: Self-pay | Admitting: General Surgery

## 2013-07-17 NOTE — Discharge Summary (Signed)
Seen, agree with above.  Pt with severe acute cholecystitis. Leave drain.   Follow up in around 2 weeks.

## 2013-07-19 ENCOUNTER — Telehealth (INDEPENDENT_AMBULATORY_CARE_PROVIDER_SITE_OTHER): Payer: Self-pay | Admitting: General Surgery

## 2013-07-19 NOTE — Telephone Encounter (Signed)
Patient called 5 days post op cholecystectomy. Has drain draining serosanguinous, pink-brown fluid. Main concern intermittant nausea without vomiting.  Is eating some. No fever. Having BM's. Pain mild.  Advised he use the zofran, reduce narcotic, take BID stool softerer, drink lots of fluids.  Advised to call or go to ED if develops fever, vomiting, or worsening  pain. Please call and check up on him tomorrow.  hmi

## 2013-07-20 NOTE — Telephone Encounter (Signed)
Called and spoke with patient this morning.  Patient states he is starting to feel a little better.  Patient states this was the first morning where he has been able to eat something.  Encouraged patient to continue with small frequent bites.  Patient aware to give [place] a call back if he begins having a fever, chills, vomiting, etc.  Patient states understanding and agreeable at this time.

## 2013-07-23 ENCOUNTER — Encounter: Payer: Self-pay | Admitting: Family Medicine

## 2013-07-27 ENCOUNTER — Ambulatory Visit (INDEPENDENT_AMBULATORY_CARE_PROVIDER_SITE_OTHER): Payer: Medicare Other

## 2013-07-27 DIAGNOSIS — Z4803 Encounter for change or removal of drains: Secondary | ICD-10-CM

## 2013-07-27 NOTE — Progress Notes (Signed)
Pt comes in today for GB drain pull. DOS 9/16 by Dr. Donell Beers.  Drain output was only 5mL since 5am this morning.  I pulled the drain and dressed with gauze and neosporin.  Pt has appointment scheduled for 10/8 with Dr. Donell Beers.

## 2013-07-29 ENCOUNTER — Encounter: Payer: Self-pay | Admitting: Family Medicine

## 2013-07-29 ENCOUNTER — Ambulatory Visit (INDEPENDENT_AMBULATORY_CARE_PROVIDER_SITE_OTHER): Payer: Medicare Other | Admitting: Family Medicine

## 2013-07-29 VITALS — BP 109/72 | HR 61 | Temp 98.5°F | Wt 213.0 lb

## 2013-07-29 DIAGNOSIS — K8000 Calculus of gallbladder with acute cholecystitis without obstruction: Secondary | ICD-10-CM

## 2013-07-29 DIAGNOSIS — Z23 Encounter for immunization: Secondary | ICD-10-CM | POA: Diagnosis not present

## 2013-07-29 NOTE — Progress Notes (Signed)
  Subjective:    Patient ID: Jared [name], male    DOB: [birth year], 67 y.o.   MRN: [id number]  HPI  Gall Stones Renal Stones Here to review medications to see if associated with these.  No abdomen pain or constipation or bleeding currently. On no bowel or pain medications now.   See medication list for current medications  Review of Symptoms - see HPI  PMH - Smoking status noted.     Review of Systems     Objective:   Physical Exam Alert no acute distress Surgical scars are healing well       Assessment & Plan:

## 2013-07-29 NOTE — Assessment & Plan Note (Signed)
Improving after surgery.  Reviewed his medications.  Decided to stop B complex and continue Vit C and Asa.  I will review Garlic and message him.  He will discuss Calcium and Vit D with Dr Wilson [facility]

## 2013-07-29 NOTE — Patient Instructions (Addendum)
I will look up garlic and send you a message  Keep taking the other asa and vitamins as you are  Good to see you today!  Thanks for coming in.

## 2013-07-31 ENCOUNTER — Encounter: Payer: Self-pay | Admitting: Family Medicine

## 2013-08-05 ENCOUNTER — Encounter (INDEPENDENT_AMBULATORY_CARE_PROVIDER_SITE_OTHER): Payer: Self-pay | Admitting: General Surgery

## 2013-08-05 ENCOUNTER — Ambulatory Visit (INDEPENDENT_AMBULATORY_CARE_PROVIDER_SITE_OTHER): Payer: Medicare Other | Admitting: General Surgery

## 2013-08-05 VITALS — BP 128/86 | HR 67 | Temp 97.5°F | Ht 72.0 in | Wt 213.0 lb

## 2013-08-05 DIAGNOSIS — K8000 Calculus of gallbladder with acute cholecystitis without obstruction: Secondary | ICD-10-CM

## 2013-08-05 NOTE — Patient Instructions (Signed)
Ok to start lifting next week.

## 2013-08-05 NOTE — Assessment & Plan Note (Signed)
No evidence of surgical obligations. Drain has been removed. Followup as needed.   Free of restrictions as of 4 weeks.

## 2013-08-05 NOTE — Progress Notes (Signed)
HISTORY: Patient is now approximately 3 weeks status post laparoscopic cholecystectomy for severe acute cholecystitis. He had a drain that has been removed. He is doing very well overall. He has been able to eat whenever he wants. He denies diarrhea. He is not having any fevers or chills. He denies nausea and vomiting. He is also not any pain medication.  EXAM: General:  Alert and oriented.   Incision:  Healing well.     PATHOLOGY: Acute cholecystitis and cholelithiasis.     ASSESSMENT AND PLAN:   Calculus of gallbladder with acute cholecystitis, without mention of obstruction No evidence of surgical obligations. Drain has been removed. Followup as needed.   Free of restrictions as of 4 weeks.      Maudry Diego, MD Surgical Oncology, General & Endocrine Surgery [facility]  Carney Living, MD [place], [place], *

## 2013-08-06 DIAGNOSIS — H4011X0 Primary open-angle glaucoma, stage unspecified: Secondary | ICD-10-CM | POA: Diagnosis not present

## 2013-08-06 DIAGNOSIS — H409 Unspecified glaucoma: Secondary | ICD-10-CM | POA: Diagnosis not present

## 2013-08-10 ENCOUNTER — Emergency Department (HOSPITAL_COMMUNITY): Payer: Medicare Other

## 2013-08-10 ENCOUNTER — Encounter (HOSPITAL_COMMUNITY): Payer: Self-pay | Admitting: Emergency Medicine

## 2013-08-10 ENCOUNTER — Emergency Department (HOSPITAL_COMMUNITY)
Admission: EM | Admit: 2013-08-10 | Discharge: 2013-08-11 | Disposition: A | Discharge status: Home / Self Care | Payer: Medicare Other | Attending: Emergency Medicine | Admitting: Emergency Medicine

## 2013-08-10 DIAGNOSIS — R071 Chest pain on breathing: Secondary | ICD-10-CM | POA: Diagnosis not present

## 2013-08-10 DIAGNOSIS — R0789 Other chest pain: Secondary | ICD-10-CM | POA: Insufficient documentation

## 2013-08-10 DIAGNOSIS — Z8546 Personal history of malignant neoplasm of prostate: Secondary | ICD-10-CM | POA: Insufficient documentation

## 2013-08-10 DIAGNOSIS — R079 Chest pain, unspecified: Secondary | ICD-10-CM | POA: Diagnosis not present

## 2013-08-10 DIAGNOSIS — Z79899 Other long term (current) drug therapy: Secondary | ICD-10-CM | POA: Insufficient documentation

## 2013-08-10 DIAGNOSIS — Z8719 Personal history of other diseases of the digestive system: Secondary | ICD-10-CM | POA: Diagnosis not present

## 2013-08-10 DIAGNOSIS — Z87891 Personal history of nicotine dependence: Secondary | ICD-10-CM | POA: Insufficient documentation

## 2013-08-10 DIAGNOSIS — M129 Arthropathy, unspecified: Secondary | ICD-10-CM | POA: Diagnosis not present

## 2013-08-10 DIAGNOSIS — N189 Chronic kidney disease, unspecified: Secondary | ICD-10-CM | POA: Diagnosis not present

## 2013-08-10 DIAGNOSIS — C61 Malignant neoplasm of prostate: Secondary | ICD-10-CM | POA: Diagnosis not present

## 2013-08-10 DIAGNOSIS — Z888 Allergy status to other drugs, medicaments and biological substances status: Secondary | ICD-10-CM | POA: Diagnosis not present

## 2013-08-10 DIAGNOSIS — Z7982 Long term (current) use of aspirin: Secondary | ICD-10-CM | POA: Diagnosis not present

## 2013-08-10 LAB — CBC
HCT: 38.7 % — ABNORMAL LOW (ref 39.0–52.0)
Hemoglobin: 12.9 g/dL — ABNORMAL LOW (ref 13.0–17.0)
MCH: 28 pg (ref 26.0–34.0)
MCHC: 33.3 g/dL (ref 30.0–36.0)

## 2013-08-10 LAB — BASIC METABOLIC PANEL
BUN: 7 mg/dL (ref 6–23)
Calcium: 8.9 mg/dL (ref 8.4–10.5)
Creatinine, Ser: 0.84 mg/dL (ref 0.50–1.35)
GFR calc non Af Amer: 88 mL/min — ABNORMAL LOW (ref >90)
Glucose, Bld: 155 mg/dL — ABNORMAL HIGH (ref 70–99)

## 2013-08-10 LAB — POCT I-STAT TROPONIN I: Troponin i, poc: 0 ng/mL (ref 0.00–0.08)

## 2013-08-10 NOTE — ED Notes (Signed)
Lab drawing blood for second troponin.  Introduced self to pt; pt alert, appropriate, denies pain.

## 2013-08-10 NOTE — ED Notes (Signed)
Patient presents with c/o left breast pain that started about 4pm  Denies SOB, diaphoresis, N/V

## 2013-08-10 NOTE — ED Notes (Signed)
Pt reports sudden onset of left sided chest pain, no radiation. Denies SOB, N, V, diaphoresis or weakness. Describes as "cramping." NAD noted. Now 2/10 pain.

## 2013-08-10 NOTE — ED Notes (Signed)
Resident at bedside.  

## 2013-08-10 NOTE — ED Provider Notes (Signed)
Complains of pain at left chest immediately inferior to his breath onset 5 PM today lasting approximately 4-5 hours on spontaneously without treatment nothing makes symptoms better or worse. He is presently asymptomatic. Cardiac risk factors include age otherwise negative. On exam no distress lungs clear auscultation heart regular rate and rhythm no murmurs.. medical decision making the patient is sufficiently low risk to followup as outpatient  Doug Sou, MD 08/10/13 2313

## 2013-08-10 NOTE — ED Provider Notes (Signed)
CSN: [id number]     Arrival date & time 08/10/13  1936 History   First MD Initiated Contact with Patient 08/10/13 2045     Chief Complaint  Patient presents with  . Chest Pain   (Consider location/radiation/quality/duration/timing/severity/associated sxs/prior Treatment) Patient is a 67 y.o. male presenting with chest pain. The history is provided by the patient.  Chest Pain Pain location:  L chest Pain quality: sharp ("catching")   Pain radiates to:  Does not radiate Pain radiates to the back: no   Pain severity:  Moderate Onset quality:  Sudden Duration:  2 hours Timing:  Constant Progression:  Resolved Chronicity:  New Context: at rest   Context: no movement, not raising an arm and no stress   Worsened by:  Nothing tried Ineffective treatments:  None tried Associated symptoms: no abdominal pain, no anorexia, no anxiety, no back pain, no cough, no dizziness, no fever, no headache, no heartburn, no nausea, no orthopnea, no shortness of breath, not vomiting and no weakness   Risk factors: no aortic disease, no coronary artery disease, no diabetes mellitus, no high cholesterol, no hypertension, not obese, no prior DVT/PE and no smoking     Past Medical History  Diagnosis Date  . Chronic kidney disease   . Inguinal hernia     right  . Prostate cancer 1990  . Arthritis     SHOULDER & KNEES   Past Surgical History  Procedure Laterality Date  . Prostatectomy    . Tonsillectomy    . Eye surgery    . Cataract extraction    . Colonoscopy  01/07/2004    Dr. Stan Head  . Cholecystectomy N/A 07/14/2013    Procedure: LAPAROSCOPIC CHOLECYSTECTOMY;  Surgeon: Almond Lint, MD;  Location: [facility];  Service: General;  Laterality: N/A;   Family History  Problem Relation Age of Onset  . Prostate cancer Father   . Pancreatic cancer Mother   . Colon cancer Neg Hx    History  Substance Use Topics  . Smoking status: Former Smoker    Quit date: 10/29/1981  . Smokeless tobacco:  Never Used  . Alcohol Use: No    Review of Systems  Constitutional: Negative for fever, activity change and appetite change.  Respiratory: Negative for cough, chest tightness and shortness of breath.   Cardiovascular: Positive for chest pain. Negative for orthopnea.  Gastrointestinal: Negative for heartburn, nausea, vomiting, abdominal pain, diarrhea, constipation and anorexia.  Musculoskeletal: Negative for back pain.  Neurological: Negative for dizziness, weakness and headaches.  All other systems reviewed and are negative.    Allergies  Claritin and Milk-related compounds  Home Medications   Current Outpatient Rx  Name  Route  Sig  Dispense  Refill  . Ascorbic Acid (VITAMIN C) 500 MG tablet   Oral   Take 500 mg by mouth daily.           Marland Kitchen aspirin 325 MG tablet   Oral   Take 325 mg by mouth daily.           . bimatoprost (LUMIGAN) 0.01 % SOLN   Left Eye   Place 1 drop into the left eye at bedtime.         . Garlic 1000 MG CAPS   Oral   Take 1 capsule by mouth daily. Hold while in hospital         . Multiple Vitamins-Minerals (CENTRUM SILVER PO)   Oral   Take 1 tablet by mouth daily.           Marland Kitchen  timolol (TIMOPTIC) 0.5 % ophthalmic solution   Left Eye   Place 1 drop into the left eye daily.          . Triptorelin Pamoate 11.25 MG SUSR   Intramuscular   Inject 11.25 mg into the muscle every 3 (three) months. Take injections quarterly          BP 105/70  Pulse 60  Temp(Src) 98.2 F (36.8 C) (Oral)  Resp 15  Ht 6' (1.829 m)  Wt 217 lb 3.2 oz (98.521 kg)  BMI 29.45 kg/m2  SpO2 98% Physical Exam  Nursing note and vitals reviewed. Constitutional: He is oriented to person, place, and time. He appears well-developed and well-nourished. No distress.  Very healthy male patient in no apparent distress.  HENT:  Head: Normocephalic and atraumatic.  Mouth/Throat: Oropharynx is clear and moist. No oropharyngeal exudate.  Eyes: Conjunctivae and EOM are  normal. Pupils are equal, round, and reactive to light.  Neck: Normal range of motion. Neck supple.  Cardiovascular: Normal rate, regular rhythm, normal heart sounds and intact distal pulses.  Exam reveals no gallop and no friction rub.   No murmur heard. Pulmonary/Chest: Effort normal and breath sounds normal. No respiratory distress. He has no wheezes. He has no rales.  Location of previous pain under left breast, however no pain currently and no tenderness to palpation.  Abdominal: Soft. He exhibits no distension and no mass. There is no tenderness. There is no rebound and no guarding.  Musculoskeletal: Normal range of motion. He exhibits no edema and no tenderness.  Lymphadenopathy:    He has no cervical adenopathy.  Neurological: He is alert and oriented to person, place, and time. No cranial nerve deficit.  Skin: Skin is warm and dry. No rash noted. He is not diaphoretic.  Psychiatric: He has a normal mood and affect. His behavior is normal. Judgment and thought content normal.    ED Course  Procedures (including critical care time) Labs Review Labs Reviewed  CBC - Abnormal; Notable for the following:    Hemoglobin 12.9 (*)    HCT 38.7 (*)    RDW 17.0 (*)    All other components within normal limits  BASIC METABOLIC PANEL - Abnormal; Notable for the following:    Glucose, Bld 155 (*)    GFR calc non Af Amer 88 (*)    All other components within normal limits  POCT I-STAT TROPONIN I  POCT I-STAT TROPONIN I   Imaging Review Dg Chest 2 View  08/10/2013   CLINICAL DATA:  Chest pain  EXAM: CHEST  2 VIEW  COMPARISON:  December 22, 2008  FINDINGS: The lungs are clear. Heart size and pulmonary vascularity are normal. No adenopathy. No pneumothorax. 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  IMPRESSION: No edema or consolidation.   Electronically Signed   By: Bretta Bang M.D.   On: 08/10/2013 21:54    EKG Interpretation   None       MDM   1. Chest wall pain      68 year old male who presents with atypical chest pain. Patient states that he was driving home when he had acute onset of left lower breast sharp pain. Described as a catching feeling. No associated nausea, shortness of breath, jaw pain, arm pain and, fever, cough. Has had similar pain in the past radiating across his entire chest that was musculoskeletal in nature. Pain lasted for approximately 2 hours but resolved prior to emergency department visit. No risk factors for heart disease,  no history of early MI, nonsmoker, no hypertension, cholesterol, diabetes.  On arrival patient is afebrile, hemodynamically stable. Well-appearing male in no apparent distress. No current symptoms. EKG obtained shows right bundle branch block which is unchanged from previous. No signs of ischemia. Chest x-ray without consolidation, effusion, pneumothorax, widened mediastinum. Initial basic labs normal and initial troponin negative. Heart score of 2 for age greater than 56. Otherwise, history and physical exam very reassuring in this patient. As he is currently chest pain-free, we'll obtain delta troponin prior to discharge. Anticipate discharge home with musculoskeletal pain as well as PCP followup and referral to cardiology for outpatient workup.  Delta trop negative. D/c home as above. Likely MSK pain.  Discussed with the patient return precautions and need for follow up with PCP. Patient voiced understanding. Stable for d/c. This patient was discussed with my attending, Dr. Ethelda Chick.   Dorna Leitz, MD 08/11/13 [phone]

## 2013-08-11 NOTE — ED Provider Notes (Addendum)
I have personally seen and examined the patient.  I have discussed the plan of care with the resident.  I have reviewed the documentation on PMH/FH/Soc. History.  I have reviewed the documentation of the resident and agree.  Doug Sou, MD 08/11/13 0112 i agree withresident ekg interpretation  Doug Sou, MD 08/23/13 [phone]

## 2013-08-17 DIAGNOSIS — N200 Calculus of kidney: Secondary | ICD-10-CM | POA: Diagnosis not present

## 2013-08-17 DIAGNOSIS — C61 Malignant neoplasm of prostate: Secondary | ICD-10-CM | POA: Diagnosis not present

## 2013-08-25 ENCOUNTER — Encounter: Payer: Self-pay | Admitting: Family Medicine

## 2013-11-05 DIAGNOSIS — H409 Unspecified glaucoma: Secondary | ICD-10-CM | POA: Diagnosis not present

## 2013-11-05 DIAGNOSIS — H4011X0 Primary open-angle glaucoma, stage unspecified: Secondary | ICD-10-CM | POA: Diagnosis not present

## 2013-11-13 DIAGNOSIS — C61 Malignant neoplasm of prostate: Secondary | ICD-10-CM | POA: Diagnosis not present

## 2013-11-18 DIAGNOSIS — C61 Malignant neoplasm of prostate: Secondary | ICD-10-CM | POA: Diagnosis not present

## 2013-11-26 DIAGNOSIS — H01029 Squamous blepharitis unspecified eye, unspecified eyelid: Secondary | ICD-10-CM | POA: Diagnosis not present

## 2013-11-26 DIAGNOSIS — H4011X0 Primary open-angle glaucoma, stage unspecified: Secondary | ICD-10-CM | POA: Diagnosis not present

## 2013-11-26 DIAGNOSIS — H409 Unspecified glaucoma: Secondary | ICD-10-CM | POA: Diagnosis not present

## 2013-12-09 IMAGING — CR DG CHEST 2V
2 series · 2 of 2 positions shown · non-contrast
Comparison: December 22, 2008

CLINICAL DATA: Chest pain

EXAM:
CHEST  2 VIEW

[w chest pa]
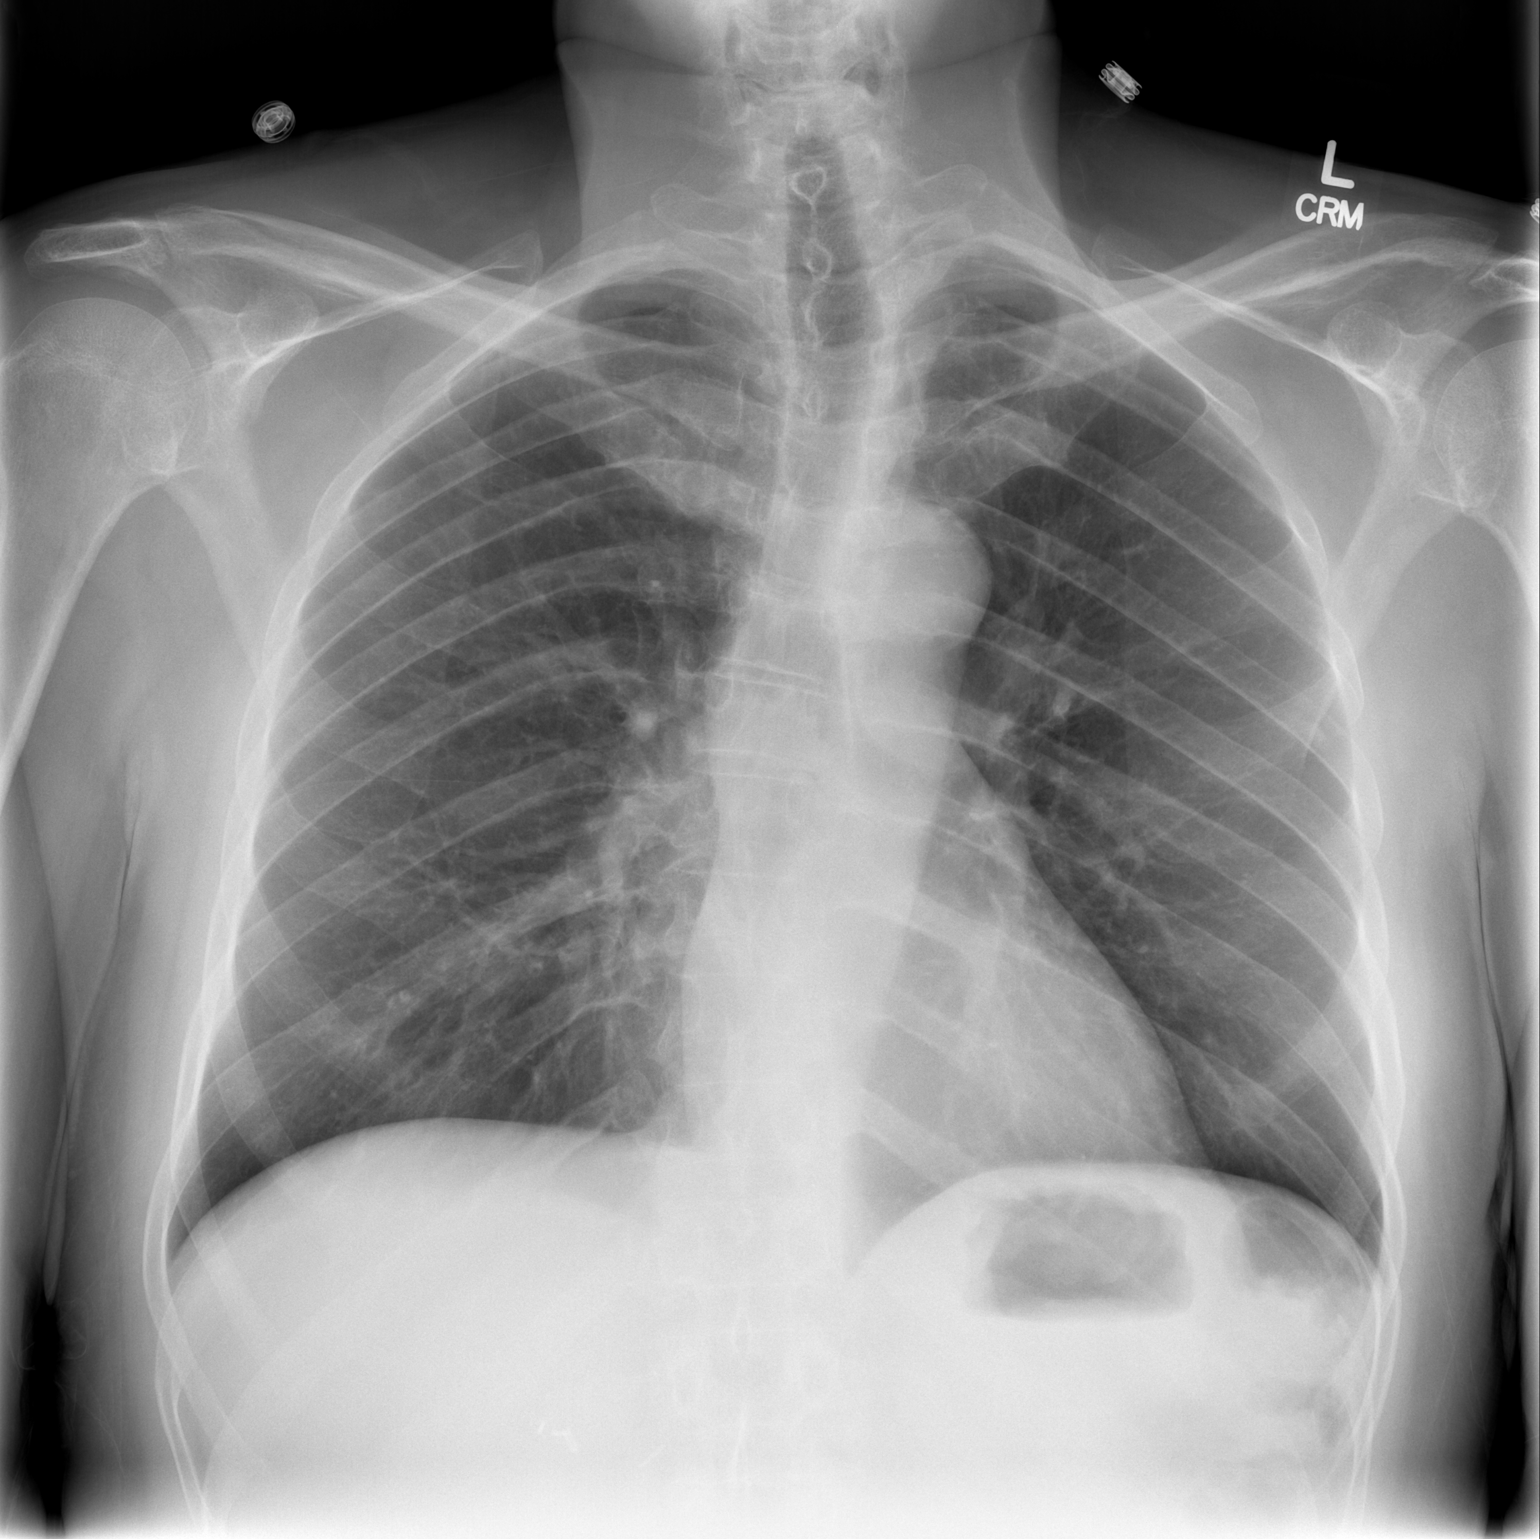

[w chest lat]
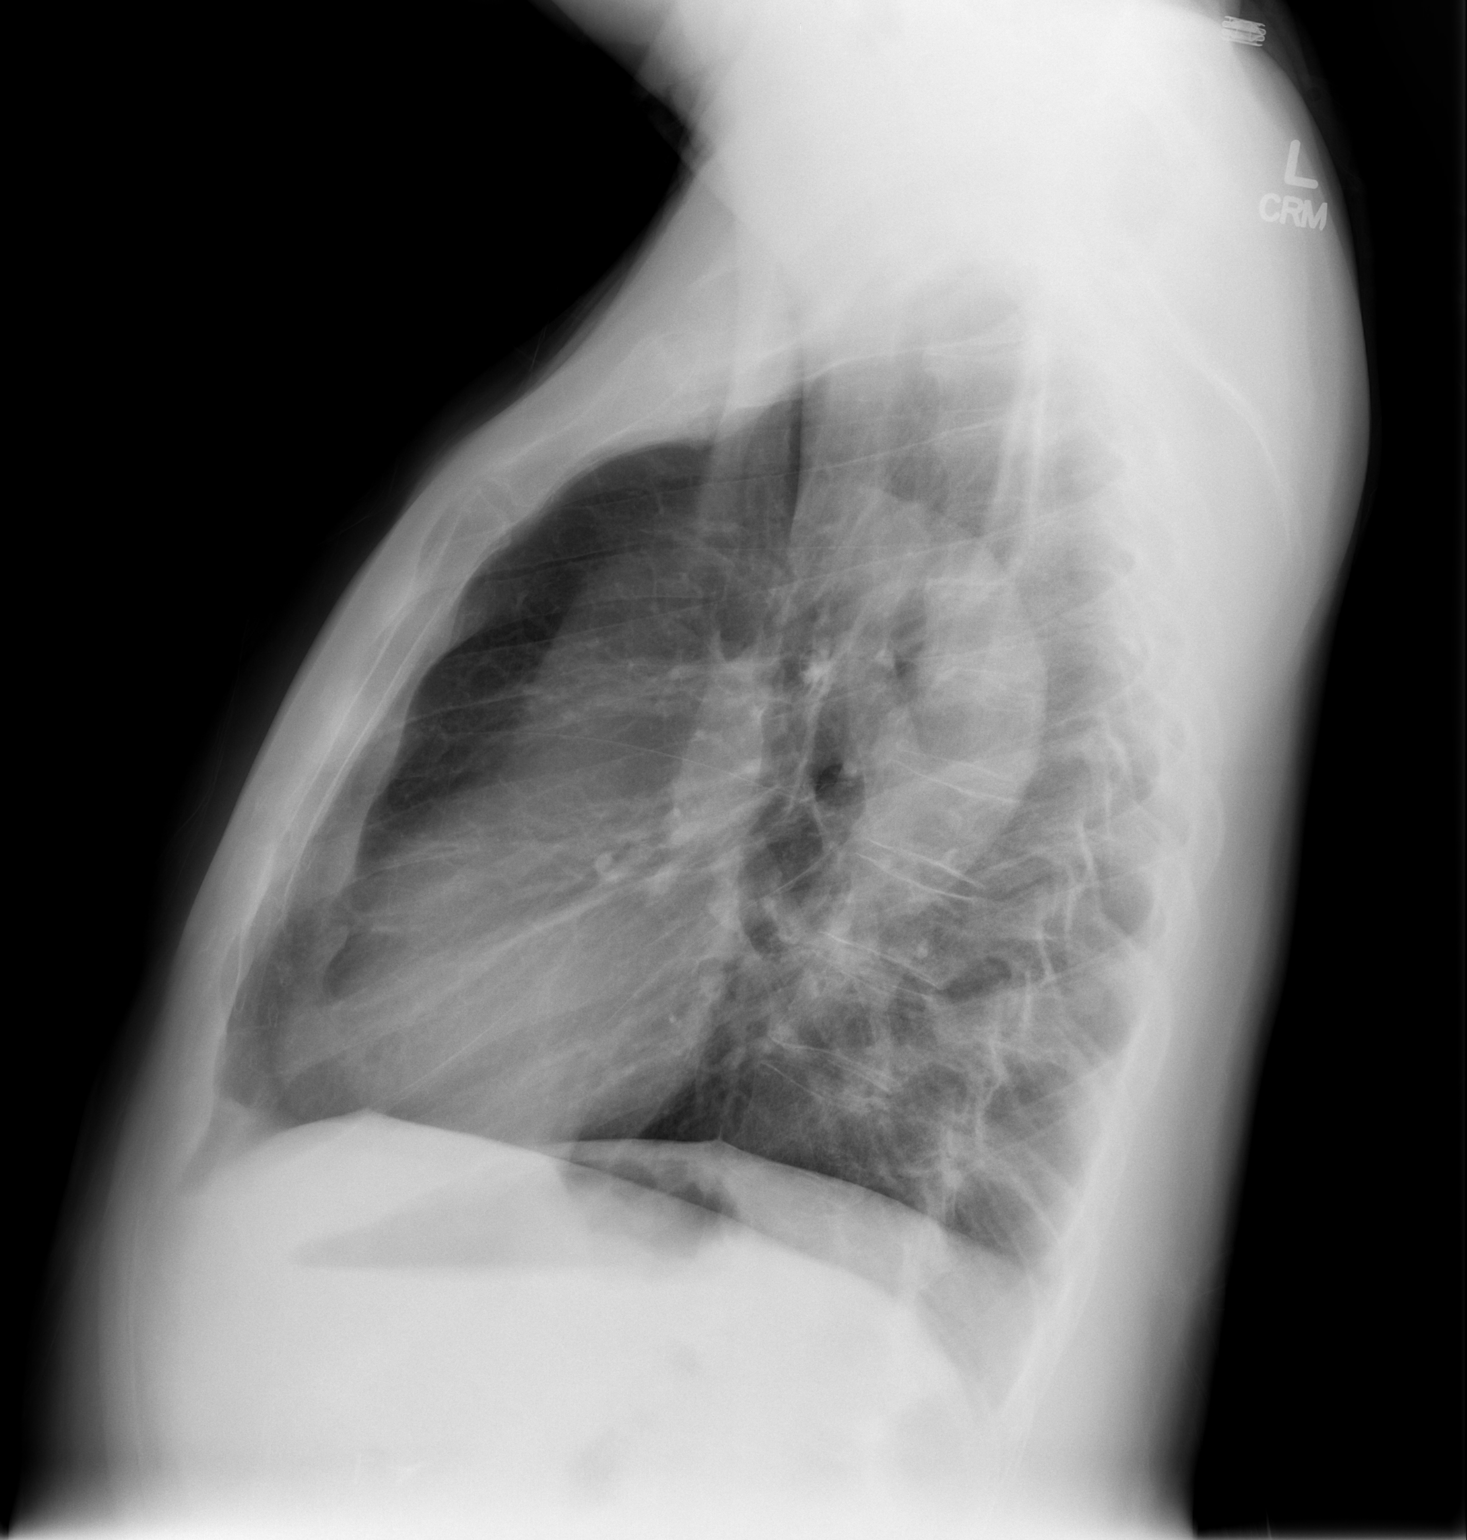

[2 of 2 positions shown; findings below may reference images not displayed]

FINDINGS: The lungs are clear. Heart size and pulmonary vascularity are
normal. No adenopathy. No pneumothorax. There is upper thoracic
levoscoliosis.
IMPRESSION: No edema or consolidation.

## 2014-01-04 DIAGNOSIS — H409 Unspecified glaucoma: Secondary | ICD-10-CM | POA: Diagnosis not present

## 2014-01-04 DIAGNOSIS — H4011X0 Primary open-angle glaucoma, stage unspecified: Secondary | ICD-10-CM | POA: Diagnosis not present

## 2014-02-16 DIAGNOSIS — C61 Malignant neoplasm of prostate: Secondary | ICD-10-CM | POA: Diagnosis not present

## 2014-02-18 DIAGNOSIS — H409 Unspecified glaucoma: Secondary | ICD-10-CM | POA: Diagnosis not present

## 2014-02-18 DIAGNOSIS — H4011X0 Primary open-angle glaucoma, stage unspecified: Secondary | ICD-10-CM | POA: Diagnosis not present

## 2014-02-22 DIAGNOSIS — C61 Malignant neoplasm of prostate: Secondary | ICD-10-CM | POA: Diagnosis not present

## 2014-03-08 ENCOUNTER — Encounter: Payer: Self-pay | Admitting: Family Medicine

## 2014-03-12 MED ORDER — TETANUS-DIPHTH-ACELL PERTUSSIS 5-2.5-18.5 LF-MCG/0.5 IM SUSP: 0.5000 mL | Freq: Once | INTRAMUSCULAR | Status: DC

## 2014-03-26 DIAGNOSIS — C61 Malignant neoplasm of prostate: Secondary | ICD-10-CM | POA: Diagnosis not present

## 2014-03-30 DIAGNOSIS — C61 Malignant neoplasm of prostate: Secondary | ICD-10-CM | POA: Diagnosis not present

## 2014-04-21 DIAGNOSIS — H4011X0 Primary open-angle glaucoma, stage unspecified: Secondary | ICD-10-CM | POA: Diagnosis not present

## 2014-04-21 DIAGNOSIS — H02059 Trichiasis without entropian unspecified eye, unspecified eyelid: Secondary | ICD-10-CM | POA: Diagnosis not present

## 2014-04-21 DIAGNOSIS — H409 Unspecified glaucoma: Secondary | ICD-10-CM | POA: Diagnosis not present

## 2014-06-23 DIAGNOSIS — C61 Malignant neoplasm of prostate: Secondary | ICD-10-CM | POA: Diagnosis not present

## 2014-06-30 DIAGNOSIS — C61 Malignant neoplasm of prostate: Secondary | ICD-10-CM | POA: Diagnosis not present

## 2014-07-26 DIAGNOSIS — H4011X0 Primary open-angle glaucoma, stage unspecified: Secondary | ICD-10-CM | POA: Diagnosis not present

## 2014-07-26 DIAGNOSIS — H02059 Trichiasis without entropian unspecified eye, unspecified eyelid: Secondary | ICD-10-CM | POA: Diagnosis not present

## 2014-07-26 DIAGNOSIS — H409 Unspecified glaucoma: Secondary | ICD-10-CM | POA: Diagnosis not present

## 2014-08-11 ENCOUNTER — Encounter: Payer: Self-pay | Admitting: Family Medicine

## 2014-08-11 ENCOUNTER — Ambulatory Visit (INDEPENDENT_AMBULATORY_CARE_PROVIDER_SITE_OTHER): Payer: Medicare Other | Admitting: Family Medicine

## 2014-08-11 VITALS — BP 122/83 | HR 58 | Temp 98.8°F | Ht 72.0 in | Wt 231.0 lb

## 2014-08-11 DIAGNOSIS — Z13220 Encounter for screening for lipoid disorders: Secondary | ICD-10-CM

## 2014-08-11 DIAGNOSIS — Z23 Encounter for immunization: Secondary | ICD-10-CM | POA: Diagnosis not present

## 2014-08-11 DIAGNOSIS — R1013 Epigastric pain: Secondary | ICD-10-CM | POA: Diagnosis not present

## 2014-08-11 NOTE — Assessment & Plan Note (Signed)
Possibly gastric irritation vis from hernia.  Improving so no further work up.  Red flags givne

## 2014-08-11 NOTE — Patient Instructions (Addendum)
Good to see you today!  Thanks for coming in.  We will check your cholesterol in 2 years  If the abdominal pain gets worse or any bleeding or other symptoms come in  Your main health issue is to lower your weight!

## 2014-08-11 NOTE — Progress Notes (Signed)
   Subjective:    Patient ID: Jared [name], male    DOB: 03/16/1946, 68 y.o.   MRN: [id number]  HPI  Feels well except  Abdomen pain Intermittent started a few weeks ago is getting better.  Just below umbiicus a burning pain.  Better with pepcide occasional.  No nausea or vomiting or bleeding or change in bowels.  Take aleve once a month.  Tums daily for Calcium  Patient reports no  vision/ hearing changes,anorexia, weight change, fever ,adenopathy, persistant / recurrent hoarseness, swallowing issues, chest pain, edema,persistant / recurrent cough, hemoptysis, dyspnea(rest, exertional, paroxysmal nocturnal), gastrointestinal  bleeding (melena, rectal bleeding), excessive heart burn, GU symptoms(dysuria, hematuria, pyuria, voiding/incontinence  Issues) syncope, focal weakness, severe memory loss, concerning skin lesions, depression, anxiety, abnormal bruising/bleeding, major joint swelling.      Review of Systems     Objective:   Physical Exam Ears:  External ear exam shows no significant lesions or deformities.  Otoscopic examination reveals clear canals, tympanic membranes are intact bilaterally without bulging, retraction, inflammation or discharge. Hearing is grossly normal bilaterall Neck:  No deformities, thyromegaly, masses, or tenderness noted.   Supple with full range of motion without pain. Heart - Regular rate and rhythm.  No murmurs, gallops or rubs.    Lungs:  Normal respiratory effort, chest expands symmetrically. Lungs are clear to auscultation, no crackles or wheezes. Abdomen: soft and non-tender without masses, organomegaly or hernias noted.  No guarding or rebound Extremities:  No cyanosis, edema, or deformity noted with good range of motion of all major joints.          Assessment & Plan:   Physical - healthy - recommend work on weight

## 2014-09-02 ENCOUNTER — Encounter: Payer: Self-pay | Admitting: Family Medicine

## 2014-09-16 ENCOUNTER — Encounter: Payer: Self-pay | Admitting: Family Medicine

## 2014-09-16 NOTE — Addendum Note (Signed)
Addended by: Talbert Cage L on: 09/16/2014 02:40 PM   Modules accepted: Level of Service

## 2014-09-28 DIAGNOSIS — C61 Malignant neoplasm of prostate: Secondary | ICD-10-CM | POA: Diagnosis not present

## 2014-10-04 DIAGNOSIS — C61 Malignant neoplasm of prostate: Secondary | ICD-10-CM | POA: Diagnosis not present

## 2015-01-10 DIAGNOSIS — C61 Malignant neoplasm of prostate: Secondary | ICD-10-CM | POA: Diagnosis not present

## 2015-01-12 ENCOUNTER — Encounter: Payer: Self-pay | Admitting: Family Medicine

## 2015-01-13 DIAGNOSIS — C61 Malignant neoplasm of prostate: Secondary | ICD-10-CM | POA: Diagnosis not present

## 2015-01-13 DIAGNOSIS — N200 Calculus of kidney: Secondary | ICD-10-CM | POA: Diagnosis not present

## 2015-01-20 DIAGNOSIS — H4011X2 Primary open-angle glaucoma, moderate stage: Secondary | ICD-10-CM | POA: Diagnosis not present

## 2015-01-20 DIAGNOSIS — H26493 Other secondary cataract, bilateral: Secondary | ICD-10-CM | POA: Diagnosis not present

## 2015-02-03 ENCOUNTER — Other Ambulatory Visit (INDEPENDENT_AMBULATORY_CARE_PROVIDER_SITE_OTHER): Payer: Self-pay | Admitting: General Surgery

## 2015-02-03 DIAGNOSIS — R1033 Periumbilical pain: Secondary | ICD-10-CM | POA: Diagnosis not present

## 2015-02-03 DIAGNOSIS — R972 Elevated prostate specific antigen [PSA]: Secondary | ICD-10-CM | POA: Diagnosis not present

## 2015-02-03 DIAGNOSIS — Z9079 Acquired absence of other genital organ(s): Secondary | ICD-10-CM | POA: Diagnosis not present

## 2015-02-03 DIAGNOSIS — K4090 Unilateral inguinal hernia, without obstruction or gangrene, not specified as recurrent: Secondary | ICD-10-CM

## 2015-02-03 DIAGNOSIS — Z8546 Personal history of malignant neoplasm of prostate: Secondary | ICD-10-CM | POA: Diagnosis not present

## 2015-02-03 NOTE — Addendum Note (Signed)
Addended by: Adin Hector on: 02/03/2015 10:58 AM   Modules accepted: Orders

## 2015-02-04 ENCOUNTER — Other Ambulatory Visit: Payer: Self-pay | Admitting: *Deleted

## 2015-02-04 DIAGNOSIS — K4090 Unilateral inguinal hernia, without obstruction or gangrene, not specified as recurrent: Secondary | ICD-10-CM

## 2015-02-07 DIAGNOSIS — K4090 Unilateral inguinal hernia, without obstruction or gangrene, not specified as recurrent: Secondary | ICD-10-CM | POA: Diagnosis not present

## 2015-02-10 ENCOUNTER — Other Ambulatory Visit: Payer: Self-pay | Admitting: General Surgery

## 2015-02-10 DIAGNOSIS — K4090 Unilateral inguinal hernia, without obstruction or gangrene, not specified as recurrent: Secondary | ICD-10-CM

## 2015-02-11 ENCOUNTER — Other Ambulatory Visit: Payer: Medicare Other

## 2015-02-11 ENCOUNTER — Ambulatory Visit
Admission: RE | Admit: 2015-02-11 | Discharge: 2015-02-11 | Disposition: A | Discharge status: Home / Self Care | Payer: Medicare Other | Source: Ambulatory Visit | Attending: General Surgery | Admitting: General Surgery

## 2015-02-11 DIAGNOSIS — D3502 Benign neoplasm of left adrenal gland: Secondary | ICD-10-CM | POA: Diagnosis not present

## 2015-02-11 DIAGNOSIS — K4090 Unilateral inguinal hernia, without obstruction or gangrene, not specified as recurrent: Secondary | ICD-10-CM | POA: Diagnosis not present

## 2015-02-11 MED ORDER — IOPAMIDOL (ISOVUE-300) INJECTION 61%
125.0000 mL | Freq: Once | INTRAVENOUS | Status: AC | PRN
  Administered 2015-02-11: 125 mL via INTRAVENOUS

## 2015-02-18 ENCOUNTER — Other Ambulatory Visit: Payer: Self-pay | Admitting: General Surgery

## 2015-02-18 DIAGNOSIS — Z8546 Personal history of malignant neoplasm of prostate: Secondary | ICD-10-CM | POA: Diagnosis not present

## 2015-02-18 DIAGNOSIS — R1033 Periumbilical pain: Secondary | ICD-10-CM | POA: Diagnosis not present

## 2015-02-18 DIAGNOSIS — R972 Elevated prostate specific antigen [PSA]: Secondary | ICD-10-CM | POA: Diagnosis not present

## 2015-02-18 DIAGNOSIS — K4090 Unilateral inguinal hernia, without obstruction or gangrene, not specified as recurrent: Secondary | ICD-10-CM | POA: Diagnosis not present

## 2015-02-18 DIAGNOSIS — Z9079 Acquired absence of other genital organ(s): Secondary | ICD-10-CM | POA: Diagnosis not present

## 2015-03-01 DIAGNOSIS — C61 Malignant neoplasm of prostate: Secondary | ICD-10-CM | POA: Diagnosis not present

## 2015-03-04 DIAGNOSIS — K4090 Unilateral inguinal hernia, without obstruction or gangrene, not specified as recurrent: Secondary | ICD-10-CM | POA: Diagnosis not present

## 2015-03-04 DIAGNOSIS — D179 Benign lipomatous neoplasm, unspecified: Secondary | ICD-10-CM | POA: Diagnosis not present

## 2015-03-12 DIAGNOSIS — Z8719 Personal history of other diseases of the digestive system: Secondary | ICD-10-CM | POA: Insufficient documentation

## 2015-03-17 DIAGNOSIS — H35372 Puckering of macula, left eye: Secondary | ICD-10-CM | POA: Diagnosis not present

## 2015-03-17 DIAGNOSIS — H4011X2 Primary open-angle glaucoma, moderate stage: Secondary | ICD-10-CM | POA: Diagnosis not present

## 2015-03-21 ENCOUNTER — Encounter: Payer: Self-pay | Admitting: Family Medicine

## 2015-04-25 ENCOUNTER — Other Ambulatory Visit: Payer: Self-pay

## 2015-05-04 DIAGNOSIS — C61 Malignant neoplasm of prostate: Secondary | ICD-10-CM | POA: Diagnosis not present

## 2015-05-11 DIAGNOSIS — C61 Malignant neoplasm of prostate: Secondary | ICD-10-CM | POA: Diagnosis not present

## 2015-07-28 DIAGNOSIS — H4011X2 Primary open-angle glaucoma, moderate stage: Secondary | ICD-10-CM | POA: Diagnosis not present

## 2015-07-28 DIAGNOSIS — H35372 Puckering of macula, left eye: Secondary | ICD-10-CM | POA: Diagnosis not present

## 2015-07-28 DIAGNOSIS — H524 Presbyopia: Secondary | ICD-10-CM | POA: Diagnosis not present

## 2015-07-28 DIAGNOSIS — H26493 Other secondary cataract, bilateral: Secondary | ICD-10-CM | POA: Diagnosis not present

## 2015-08-24 ENCOUNTER — Encounter: Payer: Self-pay | Admitting: Family Medicine

## 2015-08-24 ENCOUNTER — Ambulatory Visit (INDEPENDENT_AMBULATORY_CARE_PROVIDER_SITE_OTHER): Payer: Medicare Other | Admitting: Family Medicine

## 2015-08-24 VITALS — BP 115/77 | HR 58 | Temp 98.3°F | Ht 72.0 in | Wt 234.4 lb

## 2015-08-24 DIAGNOSIS — Z23 Encounter for immunization: Secondary | ICD-10-CM | POA: Diagnosis not present

## 2015-08-24 DIAGNOSIS — R252 Cramp and spasm: Secondary | ICD-10-CM | POA: Diagnosis not present

## 2015-08-24 DIAGNOSIS — Z1159 Encounter for screening for other viral diseases: Secondary | ICD-10-CM

## 2015-08-24 DIAGNOSIS — Z13220 Encounter for screening for lipoid disorders: Secondary | ICD-10-CM

## 2015-08-24 DIAGNOSIS — R1013 Epigastric pain: Secondary | ICD-10-CM | POA: Diagnosis not present

## 2015-08-24 DIAGNOSIS — Z0000 Encounter for general adult medical examination without abnormal findings: Secondary | ICD-10-CM

## 2015-08-24 LAB — COMPREHENSIVE METABOLIC PANEL
ALBUMIN: 4 g/dL (ref 3.6–5.1)
ALT: 18 U/L (ref 9–46)
AST: 21 U/L (ref 10–35)
Alkaline Phosphatase: 63 U/L (ref 40–115)
BILIRUBIN TOTAL: 0.6 mg/dL (ref 0.2–1.2)
BUN: 11 mg/dL (ref 7–25)
CALCIUM: 9.8 mg/dL (ref 8.6–10.3)
CO2: 30 mmol/L (ref 20–31)
Chloride: 103 mmol/L (ref 98–110)
Creat: 0.77 mg/dL (ref 0.70–1.18)
Glucose, Bld: 98 mg/dL (ref 65–99)
Potassium: 4.7 mmol/L (ref 3.5–5.3)
Sodium: 140 mmol/L (ref 135–146)
TOTAL PROTEIN: 6.5 g/dL (ref 6.1–8.1)

## 2015-08-24 LAB — LIPID PANEL
CHOLESTEROL: 181 mg/dL (ref 125–200)
HDL: 74 mg/dL (ref >=40)
LDL CALC: 86 mg/dL (ref <130)
Total CHOL/HDL Ratio: 2.4 Ratio (ref <=5.0)
Triglycerides: 103 mg/dL (ref <150)
VLDL: 21 mg/dL (ref <30)

## 2015-08-24 LAB — HEPATITIS C ANTIBODY: HCV AB: NEGATIVE

## 2015-08-24 NOTE — Patient Instructions (Signed)
Good to see you today!  Thanks for coming in.  I will call you if your tests are not good.  Otherwise I will send you a letter.  If you do not hear from me with in 2 weeks please call our office.     You homework is weight loss.  Continue with exercise but the main approach is to limit calories.  Aim to lose 1-2 lbs a week

## 2015-08-25 ENCOUNTER — Encounter: Payer: Self-pay | Admitting: Family Medicine

## 2015-08-25 NOTE — Progress Notes (Signed)
Subjective:   Jared [name] is a 69 y.o. male who presents for Medicare Annual/Subsequent preventive examination.  Review of Systems:  Patient reports no  vision/ hearing changes,anorexia, weight change, fever ,adenopathy, persistant / recurrent hoarseness, swallowing issues, chest pain, edema,persistant / recurrent cough, hemoptysis, dyspnea(rest, exertional, paroxysmal nocturnal), gastrointestinal  bleeding (melena, rectal bleeding), abdominal pain, excessive heart burn, GU symptoms(dysuria, hematuria, pyuria, voiding/incontinence  Issues) syncope, focal weakness, severe memory loss, concerning skin lesions, depression, anxiety, abnormal bruising/bleeding, major joint swelling.    Cramp - had a sudden cramp like sensation in left upper thigh a few weeks ago tht lasted about 5-10 min and has not had any since.  No weakness or change in sensation or function        Objective:    Vitals: BP 115/77 mmHg  Pulse 58  Temp(Src) 98.3 F (36.8 C) (Oral)  Ht 6' (1.829 m)  Wt 234 lb 6.4 oz (106.323 kg)  BMI 31.78 kg/m2   Neck:  No deformities, thyromegaly, masses, or tenderness noted.   Supple with full range of motion without pain. Heart - Regular rate and rhythm.  No murmurs, gallops or rubs.    Lungs:  Normal respiratory effort, chest expands symmetrically. Lungs are clear to auscultation, no crackles or wheezes. Abdomen: soft and non-tender without masses, organomegaly or hernias noted.  No guarding or rebound Extremities:  No cyanosis, edema, or deformity noted with good range of motion of all major joints.   Ears:  External ear exam shows no significant lesions or deformities.  Otoscopic examination reveals clear canals, tympanic membranes are intact bilaterally without bulging, retraction, inflammation or discharge. Hearing is grossly normal bilaterall   Tobacco History  Smoking status  . Former Smoker  . Quit date: 10/29/1981  Smokeless tobacco  . Never Used     Counseling  given: No   Past Medical History  Diagnosis Date  . Chronic kidney disease   . Inguinal hernia     right  . Prostate cancer ([place]) 1990  . Arthritis     SHOULDER & KNEES   Past Surgical History  Procedure Laterality Date  . Prostatectomy    . Tonsillectomy    . Eye surgery    . Cataract extraction    . Colonoscopy  01/07/2004    Dr. Silvano Rusk  . Cholecystectomy N/A 07/14/2013    Procedure: LAPAROSCOPIC CHOLECYSTECTOMY;  Surgeon: Stark Klein, MD;  Location: [facility];  Service: General;  Laterality: N/A;   Family History  Problem Relation Age of Onset  . Prostate cancer Father   . Pancreatic cancer Mother   . Colon cancer Neg Hx    History  Sexual Activity  . Sexual Activity: Not on file    Outpatient Encounter Prescriptions as of 08/24/2015  Medication Sig  . Ascorbic Acid (VITAMIN C) 500 MG tablet Take 500 mg by mouth daily.    Marland Kitchen aspirin 325 MG tablet Take 325 mg by mouth daily.    . Calcium Carbonate Antacid (TUMS ULTRA 1000 PO) Take 2 tablets by mouth 2 (two) times daily.  . Garlic 4540 MG CAPS Take 1 capsule by mouth daily. Hold while in hospital  . Multiple Vitamins-Minerals (CENTRUM SILVER PO) Take 1 tablet by mouth daily.    . Tafluprost (ZIOPTAN) 0.0015 % SOLN Apply 1 drop to eye every morning.  . timolol (TIMOPTIC) 0.5 % ophthalmic solution Place 1 drop into the left eye daily.   . Triptorelin Pamoate 11.25 MG SUSR Inject 11.25  mg into the muscle every 3 (three) months. Take injections quarterly  . ibuprofen (ADVIL,MOTRIN) 800 MG tablet Take 800 mg by mouth every 8 (eight) hours as needed.  . [DISCONTINUED] Tdap (BOOSTRIX) 5-2.5-18.5 LF-MCG/0.5 injection Inject 0.5 mLs into the muscle once.   No facility-administered encounter medications on file as of 08/24/2015.    Activities of Daily Living No flowsheet data found.  Patient Care Team: [name], MD as PCP - General Irine Seal, MD (Urology)   Assessment:    Trying to walk daily although  has not been as active as usual over the summer due to hernia surgery  Exercise Activities and Dietary recommendations    Goals    None     Fall Risk Fall Risk  08/24/2015 08/11/2014 07/29/2013  Falls in the past year? No No No   Depression Screen PHQ 2/9 Scores 08/24/2015 08/11/2014 07/29/2013 02/27/2012  PHQ - 2 Score 0 0 0 0    Cognitive Testing No flowsheet data found.  Immunization History  Administered Date(s) Administered  . Influenza Split 11/05/2012  . Influenza,inj,Quad PF,36+ Mos 07/29/2013, 08/11/2014, 08/24/2015  . Pneumococcal Conjugate-13 08/24/2015  . Pneumococcal Polysaccharide-23 02/21/2011  . Td 12/04/2003  . Tdap 03/15/2014  . Zoster 01/15/2008   Screening Tests Health Maintenance  Topic Date Due  . INFLUENZA VACCINE  05/29/2016  . COLONOSCOPY  03/17/2023  . TETANUS/TDAP  03/15/2024  . ZOSTAVAX  Completed  . Hepatitis C Screening  Completed  . PNA vac Low Risk Adult  Completed      Plan:    During the course of the visit the patient was educated and counseled about the following appropriate screening and preventive services:   Vaccines to include Pneumoccal, Influenza, Hepatitis B, Td, Zostavax, HCV  Cardiovascular Disease  Colorectal cancer screening  Diabetes screening  Glaucoma screening  Nutrition counseling   Patient Instructions (the written plan) was given to the patient.    [name], MD  08/25/2015

## 2015-08-30 DIAGNOSIS — C61 Malignant neoplasm of prostate: Secondary | ICD-10-CM | POA: Diagnosis not present

## 2015-09-05 DIAGNOSIS — C61 Malignant neoplasm of prostate: Secondary | ICD-10-CM | POA: Diagnosis not present

## 2015-09-05 DIAGNOSIS — N200 Calculus of kidney: Secondary | ICD-10-CM | POA: Diagnosis not present

## 2015-09-05 DIAGNOSIS — N312 Flaccid neuropathic bladder, not elsewhere classified: Secondary | ICD-10-CM | POA: Diagnosis not present

## 2015-10-12 ENCOUNTER — Encounter: Payer: Self-pay | Admitting: Family Medicine

## 2015-10-12 ENCOUNTER — Ambulatory Visit (INDEPENDENT_AMBULATORY_CARE_PROVIDER_SITE_OTHER): Payer: Medicare Other | Admitting: Family Medicine

## 2015-10-12 VITALS — BP 128/69 | HR 57 | Temp 99.0°F | Wt 241.0 lb

## 2015-10-12 DIAGNOSIS — M25512 Pain in left shoulder: Secondary | ICD-10-CM

## 2015-10-12 MED ORDER — IBUPROFEN 800 MG PO TABS: 800.0000 mg | ORAL_TABLET | Freq: Three times a day (TID) | ORAL | Status: DC | PRN

## 2015-10-12 MED ORDER — METHYLPREDNISOLONE ACETATE 40 MG/ML IJ SUSP
40.0000 mg | Freq: Once | INTRAMUSCULAR | Status: AC
  Administered 2015-10-12: 40 mg via INTRA_ARTICULAR

## 2015-10-12 NOTE — Progress Notes (Signed)
   Subjective:    Patient ID: Jared [name], male    DOB: 20-Nov-1945, 69 y.o.   MRN: [id number]  HPI  JOINT PAIN Location: Left shoulder .   Course:  Feel in bath in August.  It was sore then got back to normal in a few weeks.  Started hurting again a few weeks ago without any exacerbating factors Worse with:  movement Better with:  Rest aleve Trauma: as above Swelling:  no Locking:  no Other Joints involved:  no Rash: no Fever:  No  PMH - had similar pain in the 1990s and had injections which helped.   Has well controlled prostate ca   Chief Complaint noted Review of Symptoms - see HPI PMH - Smoking status noted.   Vital Signs reviewed    Review of Systems     Objective:   Physical Exam  Left shoulder FROM with mild pain at extremes of internal rotation No focally tender area, no deformity Distal str and sensation intact in left wrist/hand Skin:  Intact without suspicious lesions or rashes   Procedure: Left shoulder posterior injection Consent signed and scanned into record. Medication:  1 cc Solumedrol  2 cc Lidocaine !% without epi Preparation: area cleansed with alcohol and betadine Time Out taken  Injection  Landmarks identified 3 cc of medication injected into joint space using a posterior approach Patient tolerated well without bleeding or paresthesias  Patient had good range of motion of joint after injection       Assessment & Plan:

## 2015-10-12 NOTE — Patient Instructions (Addendum)
Good to see you today!  Thanks for coming in.  If your shoulder is not better in 2 weeks and back to normal in 6 then call me and we will get an xray.  You may experience a flare in the pain after the steroid shot - take the ibuprofen and use ice on the shoulder for the first 24-48 hours.  We used DepoMedrol  If any weakness or worsening pain or fever then call me

## 2015-10-13 ENCOUNTER — Encounter: Payer: Self-pay | Admitting: Family Medicine

## 2015-10-13 DIAGNOSIS — M25512 Pain in left shoulder: Secondary | ICD-10-CM | POA: Insufficient documentation

## 2015-10-13 NOTE — Assessment & Plan Note (Signed)
Consistent with rotator cuff strain.  Given trauma was months before he started having pain I doubt is related.  Given history of prostate cancer if does not improve quickly should get xray

## 2015-12-14 DIAGNOSIS — C61 Malignant neoplasm of prostate: Secondary | ICD-10-CM | POA: Diagnosis not present

## 2015-12-19 DIAGNOSIS — C61 Malignant neoplasm of prostate: Secondary | ICD-10-CM | POA: Diagnosis not present

## 2016-01-18 ENCOUNTER — Telehealth: Payer: Self-pay | Admitting: Family Medicine

## 2016-01-18 ENCOUNTER — Encounter: Payer: Self-pay | Admitting: Family Medicine

## 2016-01-18 NOTE — Telephone Encounter (Signed)
Pt would like to talk to Dr Erin Hearing.  He wants to get some information about his handicap status.  Pt needs a signature on a form.

## 2016-01-18 NOTE — Telephone Encounter (Signed)
Patient sent a mychart email to provider so will PCP respond this way to patient. Jazmin Hartsell,CMA

## 2016-01-26 DIAGNOSIS — H35373 Puckering of macula, bilateral: Secondary | ICD-10-CM | POA: Diagnosis not present

## 2016-01-26 DIAGNOSIS — H401132 Primary open-angle glaucoma, bilateral, moderate stage: Secondary | ICD-10-CM | POA: Diagnosis not present

## 2016-01-26 DIAGNOSIS — H26493 Other secondary cataract, bilateral: Secondary | ICD-10-CM | POA: Diagnosis not present

## 2016-03-14 DIAGNOSIS — C61 Malignant neoplasm of prostate: Secondary | ICD-10-CM | POA: Diagnosis not present

## 2016-03-21 DIAGNOSIS — M8580 Other specified disorders of bone density and structure, unspecified site: Secondary | ICD-10-CM | POA: Diagnosis not present

## 2016-03-21 DIAGNOSIS — N200 Calculus of kidney: Secondary | ICD-10-CM | POA: Diagnosis not present

## 2016-03-21 DIAGNOSIS — C61 Malignant neoplasm of prostate: Secondary | ICD-10-CM | POA: Diagnosis not present

## 2016-03-21 DIAGNOSIS — Z0000 Encounter for general adult medical examination without abnormal findings: Secondary | ICD-10-CM | POA: Diagnosis not present

## 2016-06-20 DIAGNOSIS — C61 Malignant neoplasm of prostate: Secondary | ICD-10-CM | POA: Diagnosis not present

## 2016-06-27 DIAGNOSIS — C61 Malignant neoplasm of prostate: Secondary | ICD-10-CM | POA: Diagnosis not present

## 2016-06-27 DIAGNOSIS — N200 Calculus of kidney: Secondary | ICD-10-CM | POA: Diagnosis not present

## 2016-06-28 DIAGNOSIS — H35373 Puckering of macula, bilateral: Secondary | ICD-10-CM | POA: Diagnosis not present

## 2016-06-28 DIAGNOSIS — H401132 Primary open-angle glaucoma, bilateral, moderate stage: Secondary | ICD-10-CM | POA: Diagnosis not present

## 2016-06-28 DIAGNOSIS — H26493 Other secondary cataract, bilateral: Secondary | ICD-10-CM | POA: Diagnosis not present

## 2016-07-09 ENCOUNTER — Encounter: Payer: Self-pay | Admitting: Family Medicine

## 2016-08-14 DIAGNOSIS — C61 Malignant neoplasm of prostate: Secondary | ICD-10-CM | POA: Diagnosis not present

## 2016-08-29 ENCOUNTER — Encounter: Payer: Self-pay | Admitting: Family Medicine

## 2016-08-29 ENCOUNTER — Ambulatory Visit (INDEPENDENT_AMBULATORY_CARE_PROVIDER_SITE_OTHER): Payer: Medicare Other | Admitting: Family Medicine

## 2016-08-29 VITALS — BP 123/76 | HR 66 | Temp 98.4°F | Wt 238.0 lb

## 2016-08-29 DIAGNOSIS — Z0000 Encounter for general adult medical examination without abnormal findings: Secondary | ICD-10-CM | POA: Diagnosis not present

## 2016-08-29 DIAGNOSIS — Z23 Encounter for immunization: Secondary | ICD-10-CM

## 2016-08-29 DIAGNOSIS — C61 Malignant neoplasm of prostate: Secondary | ICD-10-CM | POA: Diagnosis not present

## 2016-08-29 DIAGNOSIS — Z13220 Encounter for screening for lipoid disorders: Secondary | ICD-10-CM

## 2016-08-29 NOTE — Progress Notes (Signed)
Subjective:    Jared [name] is a 70 y.o. male who presents for Medicare Annual/Subsequent preventive examination.   Preventive Screening-Counseling & Management  Tobacco History  Smoking Status  . Former Smoker  . Quit date: 10/29/1981  Smokeless Tobacco  . Never Used    Problems Prior to Visit 1.   Current Problems (verified) Patient Active Problem List   Diagnosis Date Noted  . Left shoulder pain 10/13/2015  . Kidney stone 06/24/2013  . DJD (degenerative joint disease) 02/21/2011  . Screening for cholesterol level 02/13/2011  . OSTEOPENIA 03/24/2009  . GERD 02/22/2009  . PROSTATE CANCER 12/26/2006    Medications Prior to Visit Current Outpatient Prescriptions on File Prior to Visit  Medication Sig Dispense Refill  . Ascorbic Acid (VITAMIN C) 500 MG tablet Take 500 mg by mouth daily.      Marland Kitchen aspirin 325 MG tablet Take 325 mg by mouth daily.      . Calcium Carbonate Antacid (TUMS ULTRA 1000 PO) Take 2 tablets by mouth 2 (two) times daily.    . Garlic 123XX123 MG CAPS Take 1 capsule by mouth daily. Hold while in hospital    . Multiple Vitamins-Minerals (CENTRUM SILVER PO) Take 1 tablet by mouth daily.      . Tafluprost (ZIOPTAN) 0.0015 % SOLN Apply 1 drop to eye every morning.    . timolol (TIMOPTIC) 0.5 % ophthalmic solution Place 1 drop into the left eye daily.     . Triptorelin Pamoate 11.25 MG SUSR Inject 11.25 mg into the muscle every 3 (three) months. Take injections quarterly    . ibuprofen (ADVIL,MOTRIN) 800 MG tablet Take 1 tablet (800 mg total) by mouth every 8 (eight) hours as needed. (Patient not taking: Reported on 08/29/2016) 30 tablet 1   No current facility-administered medications on file prior to visit.     Current Medications (verified) Current Outpatient Prescriptions  Medication Sig Dispense Refill  . Ascorbic Acid (VITAMIN C) 500 MG tablet Take 500 mg by mouth daily.      Marland Kitchen aspirin 325 MG tablet Take 325 mg by mouth daily.      . Calcium Carbonate  Antacid (TUMS ULTRA 1000 PO) Take 2 tablets by mouth 2 (two) times daily.    . Garlic 123XX123 MG CAPS Take 1 capsule by mouth daily. Hold while in hospital    . Multiple Vitamins-Minerals (CENTRUM SILVER PO) Take 1 tablet by mouth daily.      . Tafluprost (ZIOPTAN) 0.0015 % SOLN Apply 1 drop to eye every morning.    . timolol (TIMOPTIC) 0.5 % ophthalmic solution Place 1 drop into the left eye daily.     . Triptorelin Pamoate 11.25 MG SUSR Inject 11.25 mg into the muscle every 3 (three) months. Take injections quarterly    . ibuprofen (ADVIL,MOTRIN) 800 MG tablet Take 1 tablet (800 mg total) by mouth every 8 (eight) hours as needed. (Patient not taking: Reported on 08/29/2016) 30 tablet 1   No current facility-administered medications for this visit.      Allergies (verified) Claritin [loratadine] and Milk-related compounds   PAST HISTORY  Family History Family History  Problem Relation Age of Onset  . Prostate cancer Father   . Pancreatic cancer Mother   . Colon cancer Neg Hx     Social History Social History  Substance Use Topics  . Smoking status: Former Smoker    Quit date: 10/29/1981  . Smokeless tobacco: Never Used  . Alcohol use No  Are there smokers in your home (other than you)?  No  Risk Factors Current exercise habits: Home exercise routine includes bike.  Dietary issues discussed: Yes see after visit summary    Cardiac risk factors: advanced age (older than 83 for men, 74 for women).  Depression Screen (Note: if answer to either of the following is "Yes", a more complete depression screening is indicated)   Q1: Over the past two weeks, have you felt down, depressed or hopeless? No  Q2: Over the past two weeks, have you felt little interest or pleasure in doing things? No  Have you lost interest or pleasure in daily life? No  Do you often feel hopeless? No  Do you cry easily over simple problems? No  Activities of Daily Living In your present state of health,  do you have any difficulty performing the following activities?:  Driving? No Managing money?  No Feeding yourself? No Getting from bed to chair? No Climbing a flight of stairs? No Preparing food and eating?: No Bathing or showering? No Getting dressed: No Getting to the toilet? No Using the toilet:No Moving around from place to place: No In the past year have you fallen or had a near fall?:No   Are you sexually active?  Yes  Do you have more than one partner?  No  Hearing Difficulties: No Do you often ask people to speak up or repeat themselves? No Do you experience ringing or noises in your ears? No Do you have difficulty understanding soft or whispered voices? No   Do you feel that you have a problem with memory? No  Do you often misplace items? No  Do you feel safe at home?  Yes  Cognitive Testing  Alert? Yes  Normal Appearance?Yes  Oriented to person? Yes  Place? Yes   Time? Yes  Recall of three objects?  Yes  Can perform simple calculations? Yes  Displays appropriate judgment?Yes  Can read the correct time from a watch face?Yes   Advanced Directives have been discussed with the patient? yes   List the Names of Other Physician/Practitioners you currently use: 1.  Jeffie Pollock  Indicate any recent Medical Services you may have received from other than [facility] providers in the past year (date may be approximate).  Immunization History  Administered Date(s) Administered  . Influenza Split 11/05/2012  . Influenza,inj,Quad PF,36+ Mos 07/29/2013, 08/11/2014, 08/24/2015, 08/29/2016  . Pneumococcal Conjugate-13 08/24/2015  . Pneumococcal Polysaccharide-23 02/21/2011  . Td 12/04/2003  . Tdap 03/15/2014  . Zoster 01/15/2008    Screening Tests Health Maintenance  Topic Date Due  . INFLUENZA VACCINE  05/29/2016  . COLONOSCOPY  03/17/2023  . DTaP/Tdap/Td (2 - Td) 03/15/2024  . TETANUS/TDAP  03/15/2024  . ZOSTAVAX  Completed  . Hepatitis C Screening  Completed  . PNA vac  Low Risk Adult  Completed    All answers were reviewed with the patient and necessary referrals were made:  [name], MD   08/29/2016   History reviewed: allergies, current medications, past family history, past medical history, past social history, past surgical history and problem list  Review of Systems Pertinent items noted in HPI and remainder of comprehensive ROS otherwise negative.    Objective:     Vision by Snellen chart: right [id number] declines measurement, left [id number] declines measurement Blood pressure 123/76, pulse 66, temperature 98.4 F (36.9 C), temperature source Oral, weight 238 lb (108 kg), SpO2 100 %. Body mass index is 32.28 kg/m.  Ears:  External  ear exam shows no significant lesions or deformities.  Otoscopic examination reveals clear canals, tympanic membranes are intact bilaterally without bulging, retraction, inflammation or discharge. Hearing is grossly normal bilaterall Neck:  No deformities, thyromegaly, masses, or tenderness noted.   Supple with full range of motion without pain. Mouth - no lesions, mucous membranes are moist, no decaying teeth  Heart - Regular rate and rhythm.  No murmurs, gallops or rubs.    Lungs:  Normal respiratory effort, chest expands symmetrically. Lungs are clear to auscultation, no crackles or wheezes. Abdomen: soft and non-tender without masses, organomegaly or hernias noted.  No guarding or rebound .Skin:  Intact without suspicious lesions or rashes Extremities:  No cyanosis, edema, or deformity noted with good range of motion of all major joints.        Assessment:     Healthy except weight      Plan:     During the course of the visit the patient was educated and counseled about appropriate screening and preventive services including:   See after visit summary   Diet review for nutrition referral? Yes ____  Not Indicated __Perhaps later __   Patient Instructions (the written plan) was given  to the patient.  Medicare Attestation I have personally reviewed: The patient's medical and social history Their use of alcohol, tobacco or illicit drugs Their current medications and supplements The patient's functional ability including ADLs,fall risks, home safety risks, cognitive, and hearing and visual impairment Diet and physical activities Evidence for depression or mood disorders  The patient's weight, height, BMI, and visual acuity have been recorded in the chart.  I have made referrals, counseling, and provided education to the patient based on review of the above and I have provided the patient with a written personalized care plan for preventive services.     [name], MD   08/29/2016

## 2016-08-29 NOTE — Patient Instructions (Signed)
Good to see you today!  Thanks for coming in.  Your major homework is to work on your weight.  Aim to lose about 1-2 lbs a week mainly by limiting your intake of high calorie foods  Also recommend developing balance exercises   I will call you if your tests are not good.  Otherwise I will send you a message.  If you do not hear from me with in 2 weeks please call our office.

## 2016-08-30 LAB — CBC WITH DIFFERENTIAL/PLATELET
BASOS PCT: 1 %
Basophils Absolute: 36 cells/uL (ref 0–200)
EOS PCT: 3 %
Eosinophils Absolute: 108 cells/uL (ref 15–500)
HCT: 43.9 % (ref 38.5–50.0)
Hemoglobin: 14.6 g/dL (ref 13.2–17.1)
LYMPHS ABS: 1224 {cells}/uL (ref 850–3900)
Lymphocytes Relative: 34 %
MCH: 28.9 pg (ref 27.0–33.0)
MCHC: 33.3 g/dL (ref 32.0–36.0)
MCV: 86.8 fL (ref 80.0–100.0)
MPV: 10.2 fL (ref 7.5–12.5)
Monocytes Absolute: 360 cells/uL (ref 200–950)
Monocytes Relative: 10 %
NEUTROS ABS: 1872 {cells}/uL (ref 1500–7800)
Neutrophils Relative %: 52 %
Platelets: 209 10*3/uL (ref 140–400)
RBC: 5.06 MIL/uL (ref 4.20–5.80)
RDW: 14.7 % (ref 11.0–15.0)
WBC: 3.6 10*3/uL — ABNORMAL LOW (ref 3.8–10.8)

## 2016-08-31 ENCOUNTER — Encounter: Payer: Self-pay | Admitting: Family Medicine

## 2016-09-27 ENCOUNTER — Other Ambulatory Visit: Payer: Self-pay | Admitting: Family Medicine

## 2016-09-27 DIAGNOSIS — C61 Malignant neoplasm of prostate: Secondary | ICD-10-CM | POA: Diagnosis not present

## 2016-10-04 DIAGNOSIS — R972 Elevated prostate specific antigen [PSA]: Secondary | ICD-10-CM | POA: Diagnosis not present

## 2016-10-04 DIAGNOSIS — C61 Malignant neoplasm of prostate: Secondary | ICD-10-CM | POA: Diagnosis not present

## 2016-11-22 DIAGNOSIS — H401132 Primary open-angle glaucoma, bilateral, moderate stage: Secondary | ICD-10-CM | POA: Diagnosis not present

## 2016-11-22 DIAGNOSIS — H26493 Other secondary cataract, bilateral: Secondary | ICD-10-CM | POA: Diagnosis not present

## 2016-11-22 DIAGNOSIS — H35373 Puckering of macula, bilateral: Secondary | ICD-10-CM | POA: Diagnosis not present

## 2016-12-27 DIAGNOSIS — C61 Malignant neoplasm of prostate: Secondary | ICD-10-CM | POA: Diagnosis not present

## 2017-01-02 DIAGNOSIS — C61 Malignant neoplasm of prostate: Secondary | ICD-10-CM | POA: Diagnosis not present

## 2017-01-29 ENCOUNTER — Encounter: Payer: Self-pay | Admitting: Family Medicine

## 2017-04-09 DIAGNOSIS — C61 Malignant neoplasm of prostate: Secondary | ICD-10-CM | POA: Diagnosis not present

## 2017-04-15 DIAGNOSIS — C61 Malignant neoplasm of prostate: Secondary | ICD-10-CM | POA: Diagnosis not present

## 2017-04-25 DIAGNOSIS — H26493 Other secondary cataract, bilateral: Secondary | ICD-10-CM | POA: Diagnosis not present

## 2017-04-25 DIAGNOSIS — H401132 Primary open-angle glaucoma, bilateral, moderate stage: Secondary | ICD-10-CM | POA: Diagnosis not present

## 2017-04-25 DIAGNOSIS — H524 Presbyopia: Secondary | ICD-10-CM | POA: Diagnosis not present

## 2017-04-25 DIAGNOSIS — H35372 Puckering of macula, left eye: Secondary | ICD-10-CM | POA: Diagnosis not present

## 2017-07-11 DIAGNOSIS — C61 Malignant neoplasm of prostate: Secondary | ICD-10-CM | POA: Diagnosis not present

## 2017-07-24 DIAGNOSIS — C61 Malignant neoplasm of prostate: Secondary | ICD-10-CM | POA: Diagnosis not present

## 2017-07-24 DIAGNOSIS — Z87442 Personal history of urinary calculi: Secondary | ICD-10-CM | POA: Diagnosis not present

## 2017-09-04 ENCOUNTER — Encounter: Payer: Self-pay | Admitting: Family Medicine

## 2017-09-04 ENCOUNTER — Ambulatory Visit (INDEPENDENT_AMBULATORY_CARE_PROVIDER_SITE_OTHER): Payer: Medicare Other | Admitting: Family Medicine

## 2017-09-04 ENCOUNTER — Other Ambulatory Visit: Payer: Self-pay

## 2017-09-04 VITALS — BP 132/82 | HR 55 | Temp 98.3°F | Ht 72.0 in | Wt 235.0 lb

## 2017-09-04 DIAGNOSIS — Z0000 Encounter for general adult medical examination without abnormal findings: Secondary | ICD-10-CM

## 2017-09-04 DIAGNOSIS — C61 Malignant neoplasm of prostate: Secondary | ICD-10-CM | POA: Diagnosis not present

## 2017-09-04 DIAGNOSIS — Z23 Encounter for immunization: Secondary | ICD-10-CM | POA: Diagnosis not present

## 2017-09-04 MED ORDER — IBUPROFEN 800 MG PO TABS: 800.0000 mg | ORAL_TABLET | Freq: Three times a day (TID) | ORAL | 1 refills | Status: DC | PRN

## 2017-09-04 NOTE — Patient Instructions (Signed)
Good to see you today!  Thanks for coming in.  I will message you about the results  Keep up the good work

## 2017-09-04 NOTE — Progress Notes (Signed)
S   Subjective:   Jared [name] is a 71 y.o. male who presents for Medicare Annual/Subsequent preventive examination.  Review of Systems:  Patient reports no  vision/ hearing changes,anorexia, weight change, fever ,adenopathy, persistant / recurrent hoarseness, swallowing issues, chest pain, edema,persistant / recurrent cough, hemoptysis, dyspnea(rest, exertional, paroxysmal nocturnal), gastrointestinal  bleeding (melena, rectal bleeding), abdominal pain, excessive heart burn, syncope, focal weakness, severe memory loss, concerning skin lesions, depression, anxiety, abnormal bruising/bleeding, major joint swelling.          Objective:    Vitals: BP 132/82   Pulse (!) 55   Temp 98.3 F (36.8 C) (Oral)   Ht 6' (1.829 m)   Wt 235 lb (106.6 kg)   SpO2 99%   BMI 31.87 kg/m   Body mass index is 31.87 kg/m.  Heart - Regular rate and rhythm.  No murmurs, gallops or rubs.    Lungs:  Normal respiratory effort, chest expands symmetrically. Lungs are clear to auscultation, no crackles or wheezes. Neck:  No deformities, thyromegaly, masses, or tenderness noted.   Supple with full range of motion without pain. Abdomen: soft and non-tender without masses, organomegaly or hernias noted.  No guarding or rebound Extremities:  No cyanosis, edema, or deformity noted with good range of motion of all major joints.    Tobacco Social History   Tobacco Use  Smoking Status Former Smoker  . Last attempt to quit: 10/29/1981  . Years since quitting: 35.8  Smokeless Tobacco Never Used     Counseling given: Not Answered   Past Medical History:  Diagnosis Date  . Arthritis    SHOULDER & KNEES  . Chronic kidney disease   . Inguinal hernia    right  . Prostate cancer ([place]) 1990   Past Surgical History:  Procedure Laterality Date  . CATARACT EXTRACTION    . COLONOSCOPY  01/07/2004   Dr. Silvano Rusk  . EYE SURGERY    . PROSTATECTOMY    . TONSILLECTOMY     Family History  Problem Relation Age  of Onset  . Prostate cancer Father   . Pancreatic cancer Mother   . Colon cancer Neg Hx    Social History   Substance and Sexual Activity  Sexual Activity Not on file    Outpatient Encounter Medications as of 09/04/2017  Medication Sig  . Ascorbic Acid (VITAMIN C) 500 MG tablet Take 500 mg by mouth daily.    Marland Kitchen aspirin 325 MG tablet Take 325 mg by mouth daily.    . Calcium Carbonate Antacid (TUMS ULTRA 1000 PO) Take 2 tablets by mouth 2 (two) times daily.  . cholecalciferol (VITAMIN D) 400 units TABS tablet Take 400 Units by mouth.  . Garlic 5643 MG CAPS Take 1 capsule by mouth daily. Hold while in hospital  . ibuprofen (ADVIL,MOTRIN) 800 MG tablet Take 1 tablet (800 mg total) every 8 (eight) hours as needed by mouth.  . Multiple Vitamins-Minerals (CENTRUM SILVER PO) Take 1 tablet by mouth daily.    . Tafluprost (ZIOPTAN) 0.0015 % SOLN Apply 1 drop to eye every morning.  . timolol (TIMOPTIC) 0.5 % ophthalmic solution Place 1 drop into the left eye daily.   . Triptorelin Pamoate 11.25 MG SUSR Inject 11.25 mg into the muscle every 3 (three) months. Take injections quarterly  . [DISCONTINUED] ibuprofen (ADVIL,MOTRIN) 800 MG tablet TAKE ONE TABLET BY MOUTH EVERY 8 HOURS AS NEEDED   No facility-administered encounter medications on file as of 09/04/2017.  Activities of Daily Living No flowsheet data found.  Patient Care Team: [name], MD as PCP - Dorisann Frames, MD (Urology)   Assessment:    Healthy Exercise Activities and Dietary recommendations  See after visit summary Continue current regimen  Goals    None     Fall Risk Fall Risk  09/04/2017 08/29/2016 10/12/2015 08/24/2015 08/11/2014  Falls in the past year? No No Yes No No  Number falls in past yr: - - 1 - -  Injury with Fall? - - No - -   Depression Screen PHQ 2/9 Scores 09/04/2017 08/29/2016 10/12/2015 08/24/2015  PHQ - 2 Score 0 0 0 0    Cognitive Function  Normal      Immunization  History  Administered Date(s) Administered  . Influenza Split 11/05/2012  . Influenza,inj,Quad PF,6+ Mos 07/29/2013, 08/11/2014, 08/24/2015, 08/29/2016, 09/04/2017  . Pneumococcal Conjugate-13 08/24/2015  . Pneumococcal Polysaccharide-23 02/21/2011  . Td 12/04/2003  . Tdap 03/15/2014  . Zoster 01/15/2008   Screening Tests Health Maintenance  Topic Date Due  . INFLUENZA VACCINE  05/29/2017  . COLONOSCOPY  03/17/2023  . DTaP/Tdap/Td (2 - Td) 03/15/2024  . TETANUS/TDAP  03/15/2024  . Hepatitis C Screening  Completed  . PNA vac Low Risk Adult  Completed      Plan:    Healthy Continue current activities See after visit summary   I have personally reviewed and noted the following in the patient's chart:   . Medical and social history . Use of alcohol, tobacco or illicit drugs  . Current medications and supplements . Functional ability and status . Nutritional status . Physical activity . Advanced directives . List of other physicians . Hospitalizations, surgeries, and ER visits in previous 12 months . Vitals . Screenings to include cognitive, depression, and falls . Referrals and appointments  In addition, I have reviewed and discussed with patient certain preventive protocols, quality metrics, and best practice recommendations. A written personalized care plan for preventive services as well as general preventive health recommendations were provided to patient.     [name], MD  09/04/2017

## 2017-09-05 ENCOUNTER — Encounter: Payer: Self-pay | Admitting: Family Medicine

## 2017-09-05 LAB — CBC
HEMATOCRIT: 40.4 % (ref 37.5–51.0)
Hemoglobin: 13.6 g/dL (ref 13.0–17.7)
MCH: 28.3 pg (ref 26.6–33.0)
MCHC: 33.7 g/dL (ref 31.5–35.7)
MCV: 84 fL (ref 79–97)
PLATELETS: 201 10*3/uL (ref 150–379)
RBC: 4.81 x10E6/uL (ref 4.14–5.80)
RDW: 14.9 % (ref 12.3–15.4)
WBC: 3.3 10*3/uL — ABNORMAL LOW (ref 3.4–10.8)

## 2017-09-12 DIAGNOSIS — C61 Malignant neoplasm of prostate: Secondary | ICD-10-CM | POA: Diagnosis not present

## 2017-10-03 DIAGNOSIS — H26493 Other secondary cataract, bilateral: Secondary | ICD-10-CM | POA: Diagnosis not present

## 2017-10-03 DIAGNOSIS — H524 Presbyopia: Secondary | ICD-10-CM | POA: Diagnosis not present

## 2017-12-04 DIAGNOSIS — C61 Malignant neoplasm of prostate: Secondary | ICD-10-CM | POA: Diagnosis not present

## 2017-12-12 ENCOUNTER — Other Ambulatory Visit: Payer: Self-pay | Admitting: Urology

## 2017-12-12 DIAGNOSIS — M8580 Other specified disorders of bone density and structure, unspecified site: Secondary | ICD-10-CM | POA: Diagnosis not present

## 2017-12-12 DIAGNOSIS — C61 Malignant neoplasm of prostate: Secondary | ICD-10-CM

## 2017-12-12 DIAGNOSIS — R9721 Rising PSA following treatment for malignant neoplasm of prostate: Secondary | ICD-10-CM | POA: Diagnosis not present

## 2017-12-19 ENCOUNTER — Ambulatory Visit (HOSPITAL_COMMUNITY)
Admission: RE | Admit: 2017-12-19 | Discharge: 2017-12-19 | Disposition: A | Discharge status: Home / Self Care | Payer: Medicare Other | Source: Ambulatory Visit | Attending: Urology | Admitting: Urology

## 2017-12-19 DIAGNOSIS — C61 Malignant neoplasm of prostate: Secondary | ICD-10-CM | POA: Insufficient documentation

## 2017-12-19 DIAGNOSIS — R911 Solitary pulmonary nodule: Secondary | ICD-10-CM | POA: Diagnosis not present

## 2017-12-19 MED ORDER — AXUMIN (FLUCICLOVINE F 18) INJECTION
10.3000 | Freq: Once | INTRAVENOUS | Status: AC
  Administered 2017-12-19: 10.3 via INTRAVENOUS

## 2017-12-24 ENCOUNTER — Encounter: Payer: Self-pay | Admitting: Radiation Oncology

## 2017-12-30 DIAGNOSIS — N200 Calculus of kidney: Secondary | ICD-10-CM | POA: Diagnosis not present

## 2017-12-30 DIAGNOSIS — R3121 Asymptomatic microscopic hematuria: Secondary | ICD-10-CM | POA: Diagnosis not present

## 2017-12-30 DIAGNOSIS — C61 Malignant neoplasm of prostate: Secondary | ICD-10-CM | POA: Diagnosis not present

## 2018-01-22 ENCOUNTER — Ambulatory Visit: Payer: Private Health Insurance - Indemnity | Admitting: Radiation Oncology

## 2018-01-22 ENCOUNTER — Ambulatory Visit: Payer: Private Health Insurance - Indemnity

## 2018-01-31 ENCOUNTER — Encounter: Payer: Self-pay | Admitting: Radiation Oncology

## 2018-01-31 NOTE — Progress Notes (Signed)
GU Location of Tumor / Histology: Biochemical recurrent prostate cancer  If Prostate Cancer, Gleason Score is (3 + 4). He was initially treated with prostatectomy in 1990.      Past/Anticipated interventions by urology, if any: prostatectomy, Trelstar, routine PSA, axumin PET (local recurrent disease), referral to radiation oncology for salvage therapy  Past/Anticipated interventions by medical oncology, if any: no  Weight changes, if any: no  Bowel/Bladder complaints, if any: IPSS 0. Denies dysuria, hematuria, leakage or incontinence.   Nausea/Vomiting, if any: no  Pain issues, if any:  no  SAFETY ISSUES:  Prior radiation? no  Pacemaker/ICD? no  Possible current pregnancy? no  Is the patient on methotrexate? no  Current Complaints / other details:  72 year old male. Married. Strong family hx of cancer. Trelastar every three months. Patient confirms use of ADT intermittent thus rise and fall of PSA. Scheduled to follow up with Dr. Jeffie Pollock on 01/05/2018. Denies hot flashes.

## 2018-02-03 ENCOUNTER — Encounter: Payer: Self-pay | Admitting: Radiation Oncology

## 2018-02-03 ENCOUNTER — Ambulatory Visit
Admission: RE | Admit: 2018-02-03 | Discharge: 2018-02-03 | Disposition: A | Discharge status: Home / Self Care | Payer: Medicare Other | Source: Ambulatory Visit | Attending: Radiation Oncology | Admitting: Radiation Oncology

## 2018-02-03 ENCOUNTER — Encounter: Payer: Self-pay | Admitting: Medical Oncology

## 2018-02-03 ENCOUNTER — Other Ambulatory Visit: Payer: Self-pay

## 2018-02-03 VITALS — BP 134/89 | HR 56 | Temp 98.4°F | Resp 18 | Wt 249.0 lb

## 2018-02-03 DIAGNOSIS — Z87891 Personal history of nicotine dependence: Secondary | ICD-10-CM | POA: Insufficient documentation

## 2018-02-03 DIAGNOSIS — Z9079 Acquired absence of other genital organ(s): Secondary | ICD-10-CM | POA: Diagnosis not present

## 2018-02-03 DIAGNOSIS — N189 Chronic kidney disease, unspecified: Secondary | ICD-10-CM | POA: Insufficient documentation

## 2018-02-03 DIAGNOSIS — K4090 Unilateral inguinal hernia, without obstruction or gangrene, not specified as recurrent: Secondary | ICD-10-CM | POA: Insufficient documentation

## 2018-02-03 DIAGNOSIS — C61 Malignant neoplasm of prostate: Secondary | ICD-10-CM | POA: Diagnosis not present

## 2018-02-03 DIAGNOSIS — Z7982 Long term (current) use of aspirin: Secondary | ICD-10-CM | POA: Diagnosis not present

## 2018-02-03 DIAGNOSIS — M129 Arthropathy, unspecified: Secondary | ICD-10-CM | POA: Diagnosis not present

## 2018-02-03 DIAGNOSIS — Z79899 Other long term (current) drug therapy: Secondary | ICD-10-CM | POA: Insufficient documentation

## 2018-02-03 DIAGNOSIS — R9721 Rising PSA following treatment for malignant neoplasm of prostate: Secondary | ICD-10-CM | POA: Diagnosis not present

## 2018-02-03 DIAGNOSIS — Z809 Family history of malignant neoplasm, unspecified: Secondary | ICD-10-CM

## 2018-02-03 DIAGNOSIS — Z8042 Family history of malignant neoplasm of prostate: Secondary | ICD-10-CM | POA: Diagnosis not present

## 2018-02-03 NOTE — Progress Notes (Signed)
Radiation Oncology         [phone] ________________________________  Initial Outpatient Consultation  Name: Jared [name] MRN: [id number]  Date: 02/03/2018  DOB: 05/04/1946  [id number], Jeb Levering, MD  Irine Seal, MD   REFERRING PHYSICIAN: Irine Seal, MD  DIAGNOSIS: 72 y.o. 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 PROSTATE CANCER C61     HISTORY OF PRESENT ILLNESS: Jared [name] is a 72 y.o. male with a diagnosis of prostate cancer. He was treated with prostatectomy in 1990 and had a 3+4 adenocarcinoma with perineural invasion. No other high risk pathologic findings were noted. He was followed with PSA levels in the 0.10- 1 range and underwent scans and had a negative work up. He has been on [facility] antagonists and has been followed by Dr. Jeffie Pollock with serial PSAs in the recent past, and receiving intermittent ADT. He states that since the 1990s, he would receive ADT every 3 months until his PSA dropped to <1 then would discontinue ADT until the PSA significantly increased again. He also states that he has tried having consistent ADT for 1-2 years, but his testosterone level would continue to fluctuate from <20 to >300. More recently, he was given Trelstar for PSA 7.27 in November 2017. His PSA fell to 0.45 on therapy but has continued to gradually rise again and is now 11.8 as of 12/04/2017. He was evaluated with Axumin PET on 12/19/2017 which showed focal activity centrally within the prostate bed but no evidence of metastatic adenopathy in the abdomen or pelvis, distant metastatic disease, or skeletal metastasis. He met with Dr. Jeffie Pollock last month and discussed these results and he made recommendations to meet with Dr. Tammi Klippel regarding options of salvage treatment.  Recent PSA  History: 11/2017: 11.70 08/2017: 1.60 06/2017: 0.45 03/2017: 0.60 12/2016: 1.29 08/2016: 7.27 07/2016: 1.37 05/2016: 0.55  He has kindly been referred today for discussion of salvage radiation treatment.   PREVIOUS RADIATION THERAPY: No  PAST MEDICAL HISTORY:  Past Medical History:  Diagnosis Date  . Arthritis    SHOULDER & KNEES  . Chronic kidney disease   . Inguinal hernia    right  . Prostate cancer ([place]) 1990      PAST SURGICAL HISTORY: Past Surgical History:  Procedure Laterality Date  . CATARACT EXTRACTION    . CHOLECYSTECTOMY N/A 07/14/2013   Procedure: LAPAROSCOPIC CHOLECYSTECTOMY;  Surgeon: Stark Klein, MD;  Location: [facility];  Service: General;  Laterality: N/A;  . COLONOSCOPY  01/07/2004   Dr. Silvano Rusk  . EYE SURGERY    . PROSTATECTOMY    . TONSILLECTOMY      FAMILY HISTORY:  Family History  Problem Relation Age of Onset  . Prostate cancer Father   . Pancreatic cancer Mother   . Kidney cancer Mother   . Prostate cancer Brother   . Prostate cancer Paternal Uncle   . Prostate cancer Paternal Grandfather   . Prostate cancer Paternal Uncle   . Prostate cancer Paternal Uncle   . Prostate cancer Paternal Uncle   . Colon cancer Neg Hx     SOCIAL HISTORY:  Social History   Socioeconomic History  . Marital status: Married    Spouse name: Not on file  . Number of children: 1  . Years of education: Not on file  . Highest education level: Not on file  Occupational History  .  Occupation: retired  Scientific laboratory technician  . Financial resource strain: Not on file  . Food insecurity:    Worry: Not on file    Inability: Not on file  . Transportation needs:    Medical: Not on file    Non-medical: Not on file  Tobacco Use  . Smoking status: Former Smoker    Packs/day: 0.50    Years: 20.00    Pack years: 10.00    Types: Cigarettes    Last attempt to quit: 10/29/1981    Years since quitting: 36.2  . Smokeless tobacco: Never Used  Substance and Sexual  Activity  . Alcohol use: No  . Drug use: No  . Sexual activity: Yes  Lifestyle  . Physical activity:    Days per week: Not on file    Minutes per session: Not on file  . Stress: Not on file  Relationships  . Social connections:    Talks on phone: Not on file    Gets together: Not on file    Attends religious service: Not on file    Active member of club or organization: Not on file    Attends meetings of clubs or organizations: Not on file    Relationship status: Not on file  . Intimate partner violence:    Fear of current or ex partner: Not on file    Emotionally abused: Not on file    Physically abused: Not on file    Forced sexual activity: Not on file  Other Topics Concern  . Not on file  Social History Narrative   Married to [employer]. Retired. College Graduate of [employer] where he met his wife.Worked in [employer] then moved back to [place] once retired.           The patient is originally from [place]. He has one daughter.  ALLERGIES: Claritin [loratadine] and Milk-related compounds  MEDICATIONS:  Current Outpatient Medications  Medication Sig Dispense Refill  . Ascorbic Acid (VITAMIN C) 500 MG tablet Take 500 mg by mouth daily.      Marland Kitchen aspirin 325 MG tablet Take 325 mg by mouth daily.      . Calcium Carbonate Antacid (TUMS ULTRA 1000 PO) Take 2 tablets by mouth 2 (two) times daily.    . cholecalciferol (VITAMIN D) 400 units TABS tablet Take 400 Units by mouth.    . Garlic 6378 MG CAPS Take 1 capsule by mouth daily. Hold while in hospital    . ibuprofen (ADVIL,MOTRIN) 800 MG tablet Take 1 tablet (800 mg total) every 8 (eight) hours as needed by mouth. 30 tablet 1  . Multiple Vitamins-Minerals (CENTRUM SILVER PO) Take 1 tablet by mouth daily.      . Tafluprost (ZIOPTAN) 0.0015 % SOLN Apply 1 drop to eye every morning.    . timolol (TIMOPTIC) 0.5 % ophthalmic solution Place 1 drop into the left eye daily.     . Triptorelin Pamoate 11.25 MG SUSR Inject 11.25 mg into the  muscle every 3 (three) months. Take injections quarterly     No current facility-administered medications for this encounter.     REVIEW OF SYSTEMS:  On review of systems, the patient reports that he is doing well overall. He denies any chest pain, shortness of breath, cough, fevers, chills, night sweats, or unintended weight changes. He denies any bowel disturbances, and denies abdominal pain, nausea or vomiting. He denies any new musculoskeletal or joint aches or pains. His IPSS was 0, indicating no urinary symptoms. He is  able to complete sexual activity with all attempts. A complete review of systems is obtained and is otherwise negative.    PHYSICAL EXAM:  Wt Readings from Last 3 Encounters:  02/03/18 249 lb (112.9 kg)  09/04/17 235 lb (106.6 kg)  08/29/16 238 lb (108 kg)   Temp Readings from Last 3 Encounters:  02/03/18 98.4 F (36.9 C) (Oral)  09/04/17 98.3 F (36.8 C) (Oral)  08/29/16 98.4 F (36.9 C) (Oral)   BP Readings from Last 3 Encounters:  02/03/18 134/89  09/04/17 132/82  08/29/16 123/76   Pulse Readings from Last 3 Encounters:  02/03/18 (!) 56  09/04/17 (!) 55  08/29/16 66   Pain Assessment Pain Score: 0-No pain/10  In general this is a well appearing African-American male in no acute distress. He's alert and oriented x4 and appropriate throughout the examination. Cardiopulmonary assessment is negative for acute distress and he exhibits normal effort.    KPS = 100  100 - Normal; no complaints; no evidence of disease. 90   - Able to carry on normal activity; minor signs or symptoms of disease. 80   - Normal activity with effort; some signs or symptoms of disease. 43   - Cares for self; unable to carry on normal activity or to do active work. 60   - Requires occasional assistance, but is able to care for most of his personal needs. 50   - Requires considerable assistance and frequent medical care. 24   - Disabled; requires special care and assistance. 84    - Severely disabled; hospital admission is indicated although death not imminent. 29   - Very sick; hospital admission necessary; active supportive treatment necessary. 10   - Moribund; fatal processes progressing rapidly. 0     - Dead  Karnofsky DA, Abelmann Bowers, Craver LS and Burchenal [facility] [address] [phone]) The use of the nitrogen mustards in the palliative treatment of carcinoma: with particular reference to bronchogenic carcinoma Cancer 1 [id number]  LABORATORY DATA:  Lab Results  Component Value Date   WBC 3.3 (L) 09/04/2017   HGB 13.6 09/04/2017   HCT 40.4 09/04/2017   MCV 84 09/04/2017   PLT 201 09/04/2017   Lab Results  Component Value Date   NA 140 08/24/2015   K 4.7 08/24/2015   CL 103 08/24/2015   CO2 30 08/24/2015   Lab Results  Component Value Date   ALT 18 08/24/2015   AST 21 08/24/2015   ALKPHOS 63 08/24/2015   BILITOT 0.6 08/24/2015     RADIOGRAPHY: No results found.    IMPRESSION/PLAN: 1. 72 y.o. 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 We reviewed the patient's course to date and discusses the options of salvage treatment. We discussed the risks, benefits, short, and long term effects of radiotherapy, and the patient is not quite sure he'd like to proceed. He is also considering staying the course with Trelstar. He appears to remain castrate sensitive though he does have fluctuations in testosterone despite this. We also discussed that in scenarios when PSA is less than 3, that patients do very well, though he may only have a modest benefit. After considering his options of a course of 7 weeks of salvage treatment, the patient has decided to continue the course of hormone blockade rather than radiotherapy at this time. We will be happy to see him back in the future to discuss options at a later date as  well.   2. Possible genetic predisposition to malignancy. The patient is  a candidate for genetic testing given his personal and family history of prostate cancer. He was offered referral and declines at this time.  In a visit lasting 60 minutes, greater than 50% of the time was spent face to face discussing his case, and coordinating the patient's care.    Carola Rhine, PAC And  Sheral Apley Tammi Klippel, M.D.  This document serves as a record of services personally performed by Tyler Pita, MD and Shona Simpson, PA-C. It was created on their behalf by Rae Lips, a trained medical scribe. The creation of this record is based on the scribe's personal observations and the providers' statements to them. This document has been checked and approved by the attending providers.

## 2018-02-03 NOTE — Progress Notes (Signed)
Introduced myself to Mr. Jared [name] and his wife as the nurse navigator and my role. He states he had prostatectomy in 1990 and has done extremely well. He has received intermittent hormone injections for rising PSA. He recently had an Axium PET that did not show any disease outside the prostate fossa but it did show activity in the fossa.  He would like hear about radiation but is not sure he wants to proceed at this time. He is considering continuing Trelstar since it does bring his PSA down. I gave them my business card and asked them to call with questions or concerns.

## 2018-02-03 NOTE — Progress Notes (Signed)
See progress note under physician encounter. 

## 2018-02-27 DIAGNOSIS — H35372 Puckering of macula, left eye: Secondary | ICD-10-CM | POA: Diagnosis not present

## 2018-02-27 DIAGNOSIS — H401132 Primary open-angle glaucoma, bilateral, moderate stage: Secondary | ICD-10-CM | POA: Diagnosis not present

## 2018-02-27 DIAGNOSIS — H02055 Trichiasis without entropian left lower eyelid: Secondary | ICD-10-CM | POA: Diagnosis not present

## 2018-02-27 DIAGNOSIS — H26493 Other secondary cataract, bilateral: Secondary | ICD-10-CM | POA: Diagnosis not present

## 2018-02-27 DIAGNOSIS — H02054 Trichiasis without entropian left upper eyelid: Secondary | ICD-10-CM | POA: Diagnosis not present

## 2018-03-26 DIAGNOSIS — C61 Malignant neoplasm of prostate: Secondary | ICD-10-CM | POA: Diagnosis not present

## 2018-03-31 DIAGNOSIS — R9721 Rising PSA following treatment for malignant neoplasm of prostate: Secondary | ICD-10-CM | POA: Diagnosis not present

## 2018-03-31 DIAGNOSIS — C61 Malignant neoplasm of prostate: Secondary | ICD-10-CM | POA: Diagnosis not present

## 2018-06-25 DIAGNOSIS — C61 Malignant neoplasm of prostate: Secondary | ICD-10-CM | POA: Diagnosis not present

## 2018-07-02 DIAGNOSIS — R9721 Rising PSA following treatment for malignant neoplasm of prostate: Secondary | ICD-10-CM | POA: Diagnosis not present

## 2018-07-02 DIAGNOSIS — C61 Malignant neoplasm of prostate: Secondary | ICD-10-CM | POA: Diagnosis not present

## 2018-08-07 DIAGNOSIS — H26493 Other secondary cataract, bilateral: Secondary | ICD-10-CM | POA: Diagnosis not present

## 2018-08-07 DIAGNOSIS — H35372 Puckering of macula, left eye: Secondary | ICD-10-CM | POA: Diagnosis not present

## 2018-08-07 DIAGNOSIS — H02054 Trichiasis without entropian left upper eyelid: Secondary | ICD-10-CM | POA: Diagnosis not present

## 2018-08-07 DIAGNOSIS — H401132 Primary open-angle glaucoma, bilateral, moderate stage: Secondary | ICD-10-CM | POA: Diagnosis not present

## 2018-08-14 DIAGNOSIS — C61 Malignant neoplasm of prostate: Secondary | ICD-10-CM | POA: Diagnosis not present

## 2018-08-14 DIAGNOSIS — Z5111 Encounter for antineoplastic chemotherapy: Secondary | ICD-10-CM | POA: Diagnosis not present

## 2018-09-09 ENCOUNTER — Encounter: Payer: Self-pay | Admitting: Family Medicine

## 2018-09-09 ENCOUNTER — Other Ambulatory Visit: Payer: Self-pay

## 2018-09-09 ENCOUNTER — Ambulatory Visit (INDEPENDENT_AMBULATORY_CARE_PROVIDER_SITE_OTHER): Payer: Medicare Other | Admitting: Family Medicine

## 2018-09-09 VITALS — HR 64 | Temp 98.8°F | Ht 72.0 in | Wt 238.0 lb

## 2018-09-09 DIAGNOSIS — Z23 Encounter for immunization: Secondary | ICD-10-CM

## 2018-09-09 DIAGNOSIS — C61 Malignant neoplasm of prostate: Secondary | ICD-10-CM

## 2018-09-09 DIAGNOSIS — Z0000 Encounter for general adult medical examination without abnormal findings: Secondary | ICD-10-CM

## 2018-09-09 DIAGNOSIS — Z13220 Encounter for screening for lipoid disorders: Secondary | ICD-10-CM | POA: Diagnosis not present

## 2018-09-09 MED ORDER — IBUPROFEN 800 MG PO TABS: 800.0000 mg | ORAL_TABLET | Freq: Three times a day (TID) | ORAL | 1 refills | Status: DC | PRN

## 2018-09-09 MED ORDER — ZOSTER VAC RECOMB ADJUVANTED 50 MCG/0.5ML IM SUSR: INTRAMUSCULAR | 1 refills | Status: DC

## 2018-09-09 NOTE — Progress Notes (Signed)
s    Subjective:   Jared [name] is a 72 y.o. male who presents for Medicare Annual/Subsequent preventive examination.  Review of Systems:  Patient reports no  vision/ hearing changes,anorexia, weight change, fever ,adenopathy, persistant / recurrent hoarseness, swallowing issues, chest pain, edema,persistant / recurrent cough, hemoptysis, dyspnea(rest, exertional, paroxysmal nocturnal), gastrointestinal  bleeding (melena, rectal bleeding), abdominal pain, excessive heart burn, GU symptoms(dysuria, hematuria, pyuria, voiding/incontinence  Issues) syncope, focal weakness, severe memory loss, concerning skin lesions, depression, anxiety, abnormal bruising/bleeding, major joint swelling.          Objective:    Vitals: Pulse 64   Temp 98.8 F (37.1 C) (Oral)   Ht 6' (1.829 m)   Wt 238 lb (108 kg)   SpO2 98%   BMI 32.28 kg/m   Body mass index is 32.28 kg/m.  Psych:  Cognition and judgment appear intact. Alert, communicative  and cooperative with normal attention span and concentration. No apparent delusions, illusions, hallucinations Heart - Regular rate and rhythm.  No murmurs, gallops or rubs.    Lungs:  Normal respiratory effort, chest expands symmetrically. Lungs are clear to auscultation, no crackles or wheezes. Abdomen: soft and non-tender without masses, organomegaly or hernias noted.  No guarding or rebound Skin:  Intact without suspicious lesions or rashes Extremities:  No cyanosis, edema, or deformity noted with good range of motion of all major joints.   Neck:  No deformities, thyromegaly, masses, or tenderness noted.   Supple with full range of motion without pain. Ears:  External ear exam shows no significant lesions or deformities.  Otoscopic examination reveals clear canals, tympanic membranes are intact bilaterally without bulging, retraction, inflammation or discharge. Hearing is grossly normal bilaterall     Advanced Directives 09/09/2018 09/04/2017 08/29/2016  10/12/2015 08/24/2015 07/14/2013 07/13/2013  Does Patient Have a Medical Advance Directive? No Yes Yes Yes Yes Patient does not have advance directive Patient does not have advance directive  Type of Advance Directive - Living will Living will [place];Living will [place];Living will - -  Does patient want to make changes to medical advance directive? - Yes (Inpatient - patient defers changing a medical advance directive at this time) - - - - -  Copy of [place] in Chart? - - Yes Yes Yes - -  Would patient like information on creating a medical advance directive? No - Patient declined - - - - - -  Pre-existing out of facility DNR order (yellow form or pink MOST form) - - - - - - -    Tobacco Social History   Tobacco Use  Smoking Status Former Smoker  . Packs/day: 0.50  . Years: 20.00  . Pack years: 10.00  . Types: Cigarettes  . Last attempt to quit: 10/29/1981  . Years since quitting: 36.8  Smokeless Tobacco Never Used     Counseling given: Not Answered   Clinical Intake:  Pre-visit preparation completed: Yes  Pain : No/denies pain Pain Score: 0-No pain     Diabetes: No  How often do you need to have someone help you when you read instructions, pamphlets, or other written materials from your doctor or pharmacy?: 1 - Never  Interpreter Needed?: No     Past Medical History:  Diagnosis Date  . Arthritis    SHOULDER & KNEES  . Chronic kidney disease   . Inguinal hernia    right  . Prostate cancer ([place]) 1990   Past Surgical History:  Procedure Laterality Date  . CATARACT EXTRACTION    . CHOLECYSTECTOMY N/A 07/14/2013   Procedure: LAPAROSCOPIC CHOLECYSTECTOMY;  Surgeon: Jared Klein, MD;  Location: [facility];  Service: General;  Laterality: N/A;  . COLONOSCOPY  01/07/2004   Dr. Silvano [name]  . EYE SURGERY    . PROSTATECTOMY    . TONSILLECTOMY     Family History  Problem Relation Age of Onset  . Prostate  cancer Father   . Pancreatic cancer Mother   . Kidney cancer Mother   . Prostate cancer Brother   . Prostate cancer Paternal Uncle   . Prostate cancer Paternal Grandfather   . Prostate cancer Paternal Uncle   . Prostate cancer Paternal Uncle   . Prostate cancer Paternal Uncle   . Colon cancer Neg Hx    Social History   Socioeconomic History  . Marital status: Married    Spouse name: Not on file  . Number of children: 1  . Years of education: Not on file  . Highest education level: Not on file  Occupational History  . Occupation: retired  Scientific laboratory technician  . Financial resource strain: Not on file  . Food insecurity:    Worry: Not on file    Inability: Not on file  . Transportation needs:    Medical: Not on file    Non-medical: Not on file  Tobacco Use  . Smoking status: Former Smoker    Packs/day: 0.50    Years: 20.00    Pack years: 10.00    Types: Cigarettes    Last attempt to quit: 10/29/1981    Years since quitting: 36.8  . Smokeless tobacco: Never Used  Substance and Sexual Activity  . Alcohol use: No  . Drug use: No  . Sexual activity: Yes  Lifestyle  . Physical activity:    Days per week: Not on file    Minutes per session: Not on file  . Stress: Not on file  Relationships  . Social connections:    Talks on phone: Not on file    Gets together: Not on file    Attends religious service: Not on file    Active member of club or organization: Not on file    Attends meetings of clubs or organizations: Not on file    Relationship status: Not on file  Other Topics Concern  . Not on file  Social History Narrative   Married to Jared [name]. Retired. College Graduate of [employer] where he met his wife.Worked in [place] then moved back to [place] once retired.             Outpatient Encounter Medications as of 09/09/2018  Medication Sig  . Ascorbic Acid (VITAMIN C) 500 MG tablet Take 500 mg by mouth daily.    Marland Kitchen aspirin 325 MG tablet Take 325 mg by mouth daily.    .  Calcium Carbonate Antacid (TUMS ULTRA 1000 PO) Take 2 tablets by mouth 2 (two) times daily.  . Garlic 5102 MG CAPS Take 1 capsule by mouth daily. Hold while in hospital  . ibuprofen (ADVIL,MOTRIN) 800 MG tablet Take 1 tablet (800 mg total) by mouth every 8 (eight) hours as needed.  . Multiple Vitamins-Minerals (CENTRUM SILVER PO) Take 1 tablet by mouth daily.    . Tafluprost (ZIOPTAN) 0.0015 % SOLN Apply 1 drop to eye every morning.  . timolol (TIMOPTIC) 0.5 % ophthalmic solution Place 1 drop into the left eye daily.   . Triptorelin Pamoate 11.25 MG SUSR Inject 11.25  mg into the muscle every 3 (three) months. Take injections quarterly  . [DISCONTINUED] ibuprofen (ADVIL,MOTRIN) 800 MG tablet Take 1 tablet (800 mg total) every 8 (eight) hours as needed by mouth.  . cholecalciferol (VITAMIN D) 400 units TABS tablet Take 400 Units by mouth.  . Zoster Vaccine Adjuvanted [facility]) injection Inject 0.5 ml IM and Repeat in 2 months   No facility-administered encounter medications on file as of 09/09/2018.     Activities of Daily Living No flowsheet data found.  Patient Care Team: [name], MD as PCP - Dorisann Frames, MD (Urology)   Assessment:   This is a routine wellness examination for [name].  Exercise Activities and Dietary recommendations    Goals   None     Fall Risk Fall Risk  02/03/2018 09/04/2017 08/29/2016 10/12/2015 08/24/2015  Falls in the past year? No No No Yes No  Number falls in past yr: - - - 1 -  Injury with Fall? - - - No -   Is the patient's home free of loose throw rugs in walkways, pet beds, electrical cords, etc?   no      Grab bars in the bathroom? no      Handrails on the stairs?   yes      Adequate lighting?   yes  Timed Get Up and Go Performed: Normal  Depression Screen PHQ 2/9 Scores 09/09/2018 02/03/2018 09/04/2017 08/29/2016  PHQ - 2 Score 0 0 0 0    Cognitive Function        Immunization History  Administered Date(s) Administered   . Influenza Split 11/05/2012  . Influenza,inj,Quad PF,6+ Mos 07/29/2013, 08/11/2014, 08/24/2015, 08/29/2016, 09/04/2017, 09/09/2018  . Pneumococcal Conjugate-13 08/24/2015  . Pneumococcal Polysaccharide-23 02/21/2011  . Td 12/04/2003  . Tdap 03/15/2014  . Zoster 01/15/2008    Qualifies for Shingles Vaccine? Sent eRx  Screening Tests Health Maintenance  Topic Date Due  . COLONOSCOPY  03/17/2023  . DTaP/Tdap/Td (2 - Td) 03/15/2024  . TETANUS/TDAP  03/15/2024  . INFLUENZA VACCINE  Completed  . Hepatitis C Screening  Completed  . PNA vac Low Risk Adult  Completed   Cancer Screenings: Lung: Low Dose CT Chest recommended if Age 34-80 years, 30 pack-year currently smoking OR have quit w/in 15years. Patient does not qualify. Colorectal: UTD  Additional Screenings: current Hepatitis C Screening:       Plan:    Normal exam   I have personally reviewed and noted the following in the patient's chart:   . Medical and social history . Use of alcohol, tobacco or illicit drugs  . Current medications and supplements . Functional ability and status . Nutritional status . Physical activity . Advanced directives . List of other physicians . Hospitalizations, surgeries, and ER visits in previous 12 months . Vitals . Screenings to include cognitive, depression, and falls . Referrals and appointments  In addition, I have reviewed and discussed with patient certain preventive protocols, quality metrics, and best practice recommendations. A written personalized care plan for preventive services as well as general preventive health recommendations were provided to patient.     [name], MD  09/10/2018

## 2018-09-09 NOTE — Patient Instructions (Addendum)
Good to see you today!  Thanks for coming in.  Consider getting the Shingrix when it works with your schedule  I will call you if your tests are not good.  Otherwise I will send you a letter.  If you do not hear from me with in 2 weeks please call our office.     Be Well

## 2018-09-10 ENCOUNTER — Encounter: Payer: Self-pay | Admitting: Family Medicine

## 2018-09-10 LAB — LIPID PANEL
CHOL/HDL RATIO: 2.9 ratio (ref 0.0–5.0)
Cholesterol, Total: 218 mg/dL — ABNORMAL HIGH (ref 100–199)
HDL: 76 mg/dL (ref >39)
LDL CALC: 129 mg/dL — AB (ref 0–99)
TRIGLYCERIDES: 65 mg/dL (ref 0–149)
VLDL Cholesterol Cal: 13 mg/dL (ref 5–40)

## 2018-09-10 LAB — CBC
HEMATOCRIT: 44.3 % (ref 37.5–51.0)
HEMOGLOBIN: 14.7 g/dL (ref 13.0–17.7)
MCH: 28.2 pg (ref 26.6–33.0)
MCHC: 33.2 g/dL (ref 31.5–35.7)
MCV: 85 fL (ref 79–97)
Platelets: 220 10*3/uL (ref 150–450)
RBC: 5.22 x10E6/uL (ref 4.14–5.80)
RDW: 13.3 % (ref 12.3–15.4)
WBC: 3.8 10*3/uL (ref 3.4–10.8)

## 2018-11-10 ENCOUNTER — Encounter: Payer: Self-pay | Admitting: Family Medicine

## 2018-11-11 ENCOUNTER — Encounter: Payer: Self-pay | Admitting: Family Medicine

## 2018-11-19 DIAGNOSIS — C61 Malignant neoplasm of prostate: Secondary | ICD-10-CM | POA: Diagnosis not present

## 2018-11-26 DIAGNOSIS — C61 Malignant neoplasm of prostate: Secondary | ICD-10-CM | POA: Diagnosis not present

## 2018-11-26 DIAGNOSIS — R9721 Rising PSA following treatment for malignant neoplasm of prostate: Secondary | ICD-10-CM | POA: Diagnosis not present

## 2018-11-26 DIAGNOSIS — R3121 Asymptomatic microscopic hematuria: Secondary | ICD-10-CM | POA: Diagnosis not present

## 2019-01-07 DIAGNOSIS — R3121 Asymptomatic microscopic hematuria: Secondary | ICD-10-CM | POA: Diagnosis not present

## 2019-01-22 ENCOUNTER — Encounter: Payer: Self-pay | Admitting: Family Medicine

## 2019-03-04 DIAGNOSIS — C61 Malignant neoplasm of prostate: Secondary | ICD-10-CM | POA: Diagnosis not present

## 2019-03-11 DIAGNOSIS — Z87442 Personal history of urinary calculi: Secondary | ICD-10-CM | POA: Diagnosis not present

## 2019-03-11 DIAGNOSIS — R9721 Rising PSA following treatment for malignant neoplasm of prostate: Secondary | ICD-10-CM | POA: Diagnosis not present

## 2019-03-11 DIAGNOSIS — C61 Malignant neoplasm of prostate: Secondary | ICD-10-CM | POA: Diagnosis not present

## 2019-04-29 ENCOUNTER — Encounter: Payer: Self-pay | Admitting: Family Medicine

## 2019-07-08 DIAGNOSIS — C61 Malignant neoplasm of prostate: Secondary | ICD-10-CM | POA: Diagnosis not present

## 2019-07-15 DIAGNOSIS — N200 Calculus of kidney: Secondary | ICD-10-CM | POA: Diagnosis not present

## 2019-07-15 DIAGNOSIS — C61 Malignant neoplasm of prostate: Secondary | ICD-10-CM | POA: Diagnosis not present

## 2019-07-15 DIAGNOSIS — R9721 Rising PSA following treatment for malignant neoplasm of prostate: Secondary | ICD-10-CM | POA: Diagnosis not present

## 2019-07-15 DIAGNOSIS — R1030 Lower abdominal pain, unspecified: Secondary | ICD-10-CM | POA: Diagnosis not present

## 2019-09-16 ENCOUNTER — Ambulatory Visit (INDEPENDENT_AMBULATORY_CARE_PROVIDER_SITE_OTHER): Payer: Medicare Other | Admitting: Family Medicine

## 2019-09-16 ENCOUNTER — Encounter: Payer: Self-pay | Admitting: Family Medicine

## 2019-09-16 ENCOUNTER — Other Ambulatory Visit: Payer: Self-pay

## 2019-09-16 DIAGNOSIS — K219 Gastro-esophageal reflux disease without esophagitis: Secondary | ICD-10-CM

## 2019-09-16 DIAGNOSIS — D729 Disorder of white blood cells, unspecified: Secondary | ICD-10-CM | POA: Diagnosis not present

## 2019-09-16 DIAGNOSIS — Z23 Encounter for immunization: Secondary | ICD-10-CM | POA: Diagnosis not present

## 2019-09-16 DIAGNOSIS — Z13220 Encounter for screening for lipoid disorders: Secondary | ICD-10-CM | POA: Diagnosis not present

## 2019-09-16 NOTE — Assessment & Plan Note (Signed)
On vit D and calcium.  Regular exercise

## 2019-09-16 NOTE — Progress Notes (Signed)
Subjective:   Jared [name] is a 73 y.o. male who presents for Medicare Annual/Subsequent preventive examination.  Review of Systems:  Patient reports no  vision/ hearing changes,anorexia, weight change, fever ,adenopathy, persistant / recurrent hoarseness, swallowing issues, chest pain, edema,persistant / recurrent cough, hemoptysis, dyspnea(rest, exertional, paroxysmal nocturnal), gastrointestinal  bleeding (melena, rectal bleeding), abdominal pain, excessive heart burn, GU symptoms(dysuria, hematuria, pyuria, voiding/incontinence  Issues) syncope, focal weakness, severe memory loss, concerning skin lesions, depression, anxiety, abnormal bruising/bleeding, major joint swelling.          Objective:    Vitals: BP 134/72   Pulse (!) 52   Wt 242 lb 6.4 oz (110 kg)   SpO2 99%   BMI 32.88 kg/m   Body mass index is 32.88 kg/m.  Advanced Directives 09/16/2019 09/09/2018 09/04/2017 08/29/2016 10/12/2015 08/24/2015 07/14/2013  Does Patient Have a Medical Advance Directive? No No Yes Yes Yes Yes Patient does not have advance directive  Type of Advance Directive - - Living will Living will [place];Living will [place];Living will -  Does patient want to make changes to medical advance directive? - - Yes (Inpatient - patient defers changing a medical advance directive at this time) - - - -  Copy of [place] in Chart? - - - Yes Yes Yes -  Would patient like information on creating a medical advance directive? No - Patient declined No - Patient declined - - - - -  Pre-existing out of facility DNR order (yellow form or pink MOST form) - - - - - - -    Tobacco Social History   Tobacco Use  Smoking Status Former Smoker  . Packs/day: 0.50  . Years: 20.00  . Pack years: 10.00  . Types: Cigarettes  . Quit date: 10/29/1981  . Years since quitting: 37.9  Smokeless Tobacco Never Used     Counseling given: Not Answered   Clinical  Intake:     Pain Score: 0-No pain                 Past Medical History:  Diagnosis Date  . Arthritis    SHOULDER & KNEES  . Chronic kidney disease   . Inguinal hernia    right  . Prostate cancer ([place]) 1990   Past Surgical History:  Procedure Laterality Date  . CATARACT EXTRACTION    . CHOLECYSTECTOMY N/A 07/14/2013   Procedure: LAPAROSCOPIC CHOLECYSTECTOMY;  Surgeon: Stark Klein, MD;  Location: [facility];  Service: General;  Laterality: N/A;  . COLONOSCOPY  01/07/2004   Dr. Silvano Rusk  . EYE SURGERY    . PROSTATECTOMY    . TONSILLECTOMY     Family History  Problem Relation Age of Onset  . Prostate cancer Father   . Pancreatic cancer Mother   . Kidney cancer Mother   . Prostate cancer Brother   . Prostate cancer Paternal Uncle   . Prostate cancer Paternal Grandfather   . Prostate cancer Paternal Uncle   . Prostate cancer Paternal Uncle   . Prostate cancer Paternal Uncle   . Colon cancer Neg Hx    Social History   Socioeconomic History  . Marital status: Married    Spouse name: Not on file  . Number of children: 1  . Years of education: Not on file  . Highest education level: Not on file  Occupational History  . Occupation: retired  Scientific laboratory technician  . Financial resource strain: Not on file  .  Food insecurity    Worry: Not on file    Inability: Not on file  . Transportation needs    Medical: Not on file    Non-medical: Not on file  Tobacco Use  . Smoking status: Former Smoker    Packs/day: 0.50    Years: 20.00    Pack years: 10.00    Types: Cigarettes    Quit date: 10/29/1981    Years since quitting: 37.9  . Smokeless tobacco: Never Used  Substance and Sexual Activity  . Alcohol use: No  . Drug use: No  . Sexual activity: Yes  Lifestyle  . Physical activity    Days per week: Not on file    Minutes per session: Not on file  . Stress: Not on file  Relationships  . Social Herbalist on phone: Not on file    Gets together: Not on  file    Attends religious service: Not on file    Active member of club or organization: Not on file    Attends meetings of clubs or organizations: Not on file    Relationship status: Not on file  Other Topics Concern  . Not on file  Social History Narrative   Married to [employer]. Retired. 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             Outpatient Encounter Medications as of 09/16/2019  Medication Sig  . Ascorbic Acid (VITAMIN C) 500 MG tablet Take 500 mg by mouth daily.    . Calcium Carbonate Antacid (TUMS ULTRA 1000 PO) Take 2 tablets by mouth 2 (two) times daily.  . cholecalciferol (VITAMIN D) 400 units TABS tablet Take 400 Units by mouth.  . Garlic 8616 MG CAPS Take 1 capsule by mouth daily. Hold while in hospital  . ibuprofen (ADVIL,MOTRIN) 800 MG tablet Take 1 tablet (800 mg total) by mouth every 8 (eight) hours as needed.  . Multiple Vitamins-Minerals (CENTRUM SILVER PO) Take 1 tablet by mouth daily.    . Tafluprost (ZIOPTAN) 0.0015 % SOLN Apply 1 drop to eye every morning.  . timolol (TIMOPTIC) 0.5 % ophthalmic solution Place 1 drop into the left eye daily.   . Triptorelin Pamoate 11.25 MG SUSR Inject 11.25 mg into the muscle every 3 (three) months. Take injections quarterly  . [DISCONTINUED] aspirin 325 MG tablet Take 325 mg by mouth daily.    . [DISCONTINUED] Zoster Vaccine Adjuvanted [facility]) injection Inject 0.5 ml IM and Repeat in 2 months   No facility-administered encounter medications on file as of 09/16/2019.     Activities of Daily Living No flowsheet data found.  Patient Care Team: [name], MD as PCP - Dorisann Frames, MD (Urology)   Assessment:   This is a routine wellness examination for [name].  Exercise Activities and Dietary recommendations    Goals   None     Fall Risk Fall Risk  09/16/2019 02/03/2018 09/04/2017 08/29/2016 10/12/2015  Falls in the past year? 0  No No No Yes  Number falls in past yr: 0 - - - 1  Injury with Fall? - - - - No   Is the patient's home free of loose throw rugs in walkways, pet beds, electrical cords, etc?  yes      Grab bars in the bathroom? no      Handrails on the stairs?   yes  Adequate lighting?   yes  Timed Get Up and Go Performed: normal Patient rating of health (0-10): excellent   Depression Screen PHQ 2/9 Scores 09/16/2019 09/09/2018 02/03/2018 09/04/2017  PHQ - 2 Score 0 0 0 0    Cognitive Function  normal recall and executive function    Immunization History  Administered Date(s) Administered  . Fluad Quad(high Dose 65+) 09/16/2019  . Influenza Split 11/05/2012  . Influenza,inj,Quad PF,6+ Mos 07/29/2013, 08/11/2014, 08/24/2015, 08/29/2016, 09/04/2017, 09/09/2018  . Pneumococcal Conjugate-13 08/24/2015  . Pneumococcal Polysaccharide-23 02/21/2011  . Td 12/04/2003  . Tdap 03/15/2014  . Zoster 01/15/2008  . Zoster Recombinat (Shingrix) 11/11/2018    Screening Tests Health Maintenance  Topic Date Due  . INFLUENZA VACCINE  05/30/2019  . COLONOSCOPY  03/17/2023  . DTaP/Tdap/Td (2 - Td) 03/15/2024  . TETANUS/TDAP  03/15/2024  . Hepatitis C Screening  Completed  . PNA vac Low Risk Adult  Completed   Cancer Screenings: Lung: Low Dose CT Chest recommended if Age 94-80 years, 30 pack-year currently smoking OR have quit w/in 15years. Patient does not qualify. Colorectal: UTD  PE Heart - Regular rate and rhythm.  No murmurs, gallops or rubs.    Lungs:  Normal respiratory effort, chest expands symmetrically. Lungs are clear to auscultation, no crackles or wheezes. Abdomen: soft and non-tender without masses, organomegaly or hernias noted.  No guarding or rebound Extremities:  No cyanosis, edema, or deformity noted with good range of motion of all major joints.   Neck:  No deformities, thyromegaly, masses, or tenderness noted.   Supple with full range of motion without pain. Skin:  Intact without  suspicious lesions or rashes Psych:  Cognition and judgment appear intact. Alert, communicative  and cooperative with normal attention span and concentration. No apparent delusions, illusions, hallucinations      Plan:    See AVS  I have personally reviewed and noted the following in the patient's chart:   . Medical and social history . Use of alcohol, tobacco or illicit drugs  . Current medications and supplements . Functional ability and status . Nutritional status . Physical activity . Advanced directives . List of other physicians . Hospitalizations, surgeries, and ER visits in previous 12 months . Vitals . Screenings to include cognitive, depression, and falls . Referrals and appointments  In addition, I have reviewed and discussed with patient certain preventive protocols, quality metrics, and best practice recommendations. A written personalized care plan for preventive services as well as general preventive health recommendations were provided to patient.   Abnormal white blood cell (WBC) count Will check cbc   GERD No current symptoms   Screening for cholesterol level Check labs   OSTEOPENIA On vit D and calcium.  Regular exercise  [name], MD  09/16/2019

## 2019-09-16 NOTE — Assessment & Plan Note (Signed)
No current symptoms

## 2019-09-16 NOTE — Patient Instructions (Addendum)
Great to see you  I would consider stopping taking aspirin  Keep doing what you are.  Watch the weight with all our isolating with Covid  I will send you the test results in My Chart

## 2019-09-16 NOTE — Assessment & Plan Note (Signed)
Will check cbc

## 2019-09-16 NOTE — Assessment & Plan Note (Signed)
Check labs 

## 2019-09-17 ENCOUNTER — Encounter: Payer: Self-pay | Admitting: Family Medicine

## 2019-09-17 LAB — CBC
Hematocrit: 41.1 % (ref 37.5–51.0)
Hemoglobin: 13.8 g/dL (ref 13.0–17.7)
MCH: 28.9 pg (ref 26.6–33.0)
MCHC: 33.6 g/dL (ref 31.5–35.7)
MCV: 86 fL (ref 79–97)
Platelets: 202 10*3/uL (ref 150–450)
RBC: 4.78 x10E6/uL (ref 4.14–5.80)
RDW: 13.1 % (ref 11.6–15.4)
WBC: 3.5 10*3/uL (ref 3.4–10.8)

## 2019-09-17 LAB — LIPID PANEL
Chol/HDL Ratio: 2.5 ratio (ref 0.0–5.0)
Cholesterol, Total: 197 mg/dL (ref 100–199)
HDL: 79 mg/dL (ref >39)
LDL Chol Calc (NIH): 104 mg/dL — ABNORMAL HIGH (ref 0–99)
Triglycerides: 75 mg/dL (ref 0–149)
VLDL Cholesterol Cal: 14 mg/dL (ref 5–40)

## 2019-09-23 ENCOUNTER — Other Ambulatory Visit: Payer: Self-pay

## 2019-09-28 ENCOUNTER — Encounter: Payer: Self-pay | Admitting: Family Medicine

## 2019-11-23 ENCOUNTER — Encounter: Payer: Self-pay | Admitting: Family Medicine

## 2020-01-27 ENCOUNTER — Encounter: Payer: Self-pay | Admitting: Family Medicine

## 2020-08-08 ENCOUNTER — Encounter: Payer: Self-pay | Admitting: Family Medicine

## 2020-08-30 ENCOUNTER — Other Ambulatory Visit: Payer: Self-pay

## 2020-08-30 ENCOUNTER — Ambulatory Visit (INDEPENDENT_AMBULATORY_CARE_PROVIDER_SITE_OTHER): Payer: Medicare HMO | Admitting: Family Medicine

## 2020-08-30 VITALS — BP 125/80 | HR 89 | Wt 237.4 lb

## 2020-08-30 DIAGNOSIS — Z23 Encounter for immunization: Secondary | ICD-10-CM | POA: Diagnosis not present

## 2020-08-30 DIAGNOSIS — Z13220 Encounter for screening for lipoid disorders: Secondary | ICD-10-CM

## 2020-08-30 DIAGNOSIS — M158 Other polyosteoarthritis: Secondary | ICD-10-CM | POA: Diagnosis not present

## 2020-08-30 DIAGNOSIS — D729 Disorder of white blood cells, unspecified: Secondary | ICD-10-CM

## 2020-08-30 DIAGNOSIS — Z0000 Encounter for general adult medical examination without abnormal findings: Secondary | ICD-10-CM

## 2020-08-30 NOTE — Assessment & Plan Note (Signed)
No bacterial infections.  Will continue to monitor

## 2020-08-30 NOTE — Patient Instructions (Signed)
Good to see you today!  Thanks for coming in.  Keep doing what you are doing  I will call you if your tests are not good.  Otherwise, I will send you a message on MyChart (if it is active) or a letter in the mail..  If you do not hear from me with in 2 weeks please call our office.

## 2020-08-30 NOTE — Assessment & Plan Note (Signed)
Diffuse mild.  Keeps active.  Rarely takes ibuprofen 800 mg if significant pain.  Discussed best to use tylenol first is mild moderate pain

## 2020-08-31 LAB — CBC WITH DIFFERENTIAL/PLATELET
Basophils Absolute: 0.1 10*3/uL (ref 0.0–0.2)
Basos: 2 %
EOS (ABSOLUTE): 0.1 10*3/uL (ref 0.0–0.4)
Eos: 4 %
Hematocrit: 45.7 % (ref 37.5–51.0)
Hemoglobin: 14.7 g/dL (ref 13.0–17.7)
Immature Grans (Abs): 0 10*3/uL (ref 0.0–0.1)
Immature Granulocytes: 0 %
Lymphocytes Absolute: 1 10*3/uL (ref 0.7–3.1)
Lymphs: 27 %
MCH: 27.5 pg (ref 26.6–33.0)
MCHC: 32.2 g/dL (ref 31.5–35.7)
MCV: 86 fL (ref 79–97)
Monocytes Absolute: 0.5 10*3/uL (ref 0.1–0.9)
Monocytes: 12 %
Neutrophils Absolute: 2 10*3/uL (ref 1.4–7.0)
Neutrophils: 55 %
Platelets: 215 10*3/uL (ref 150–450)
RBC: 5.34 x10E6/uL (ref 4.14–5.80)
RDW: 13 % (ref 11.6–15.4)
WBC: 3.7 10*3/uL (ref 3.4–10.8)

## 2020-08-31 LAB — LIPID PANEL
Chol/HDL Ratio: 2.4 ratio (ref 0.0–5.0)
Cholesterol, Total: 216 mg/dL — ABNORMAL HIGH (ref 100–199)
HDL: 91 mg/dL (ref >39)
LDL Chol Calc (NIH): 115 mg/dL — ABNORMAL HIGH (ref 0–99)
Triglycerides: 54 mg/dL (ref 0–149)
VLDL Cholesterol Cal: 10 mg/dL (ref 5–40)

## 2020-08-31 NOTE — Progress Notes (Signed)
Subjective:   Jared [name] is a 74 y.o. male who presents for Medicare Annual/Subsequent preventive examination.  Review of Systems    Feels well Only issue is mild diffuse DJD No weight loss or bleeding or fever or masses       Objective:    Today's Vitals   08/30/20 0848  BP: 125/80  Pulse: 89  SpO2: 98%  Weight: 237 lb 6.4 oz (107.7 kg)  PainSc: 0-No pain   Body mass index is 32.2 kg/m.  Advanced Directives 09/16/2019 09/09/2018 09/04/2017 08/29/2016 10/12/2015 08/24/2015 07/14/2013  Does Patient Have a Medical Advance Directive? No No Yes Yes Yes Yes Patient does not have advance directive  Type of Advance Directive - - Living will Living will [place];Living will [place];Living will -  Does patient want to make changes to medical advance directive? - - Yes (Inpatient - patient defers changing a medical advance directive at this time) - - - -  Copy of [place] in Chart? - - - Yes Yes Yes -  Would patient like information on creating a medical advance directive? No - Patient declined No - Patient declined - - - - -  Pre-existing out of facility DNR order (yellow form or pink MOST form) - - - - - - -    Current Medications (verified) Outpatient Encounter Medications as of 08/30/2020  Medication Sig  . Ascorbic Acid (VITAMIN C) 500 MG tablet Take 500 mg by mouth daily.    . Calcium Carbonate Antacid (TUMS ULTRA 1000 PO) Take 2 tablets by mouth 2 (two) times daily.  . cholecalciferol (VITAMIN D3) 25 MCG (1000 UNIT) tablet   . Garlic 7078 MG CAPS Take 1 capsule by mouth daily. Hold while in hospital  . ibuprofen (ADVIL,MOTRIN) 800 MG tablet Take 1 tablet (800 mg total) by mouth every 8 (eight) hours as needed.  . Multiple Vitamins-Minerals (CENTRUM SILVER PO) Take 1 tablet by mouth daily.    . Tafluprost (ZIOPTAN) 0.0015 % SOLN Apply 1 drop to eye every morning.  . timolol (TIMOPTIC) 0.5 % ophthalmic solution Place  1 drop into the left eye daily.   . Triptorelin Pamoate 11.25 MG SUSR Inject 11.25 mg into the muscle every 3 (three) months. Take injections quarterly  . [DISCONTINUED] cholecalciferol (VITAMIN D) 400 units TABS tablet Take 400 Units by mouth.   No facility-administered encounter medications on file as of 08/30/2020.    Allergies (verified) Claritin [loratadine], Loratadine-pseudoephedrine er, and Milk-related compounds   History: Past Medical History:  Diagnosis Date  . Arthritis    SHOULDER & KNEES  . Chronic kidney disease   . Inguinal hernia    right  . Prostate cancer ([place]) 1990   Past Surgical History:  Procedure Laterality Date  . CATARACT EXTRACTION    . CHOLECYSTECTOMY N/A 07/14/2013   Procedure: LAPAROSCOPIC CHOLECYSTECTOMY;  Surgeon: Stark Klein, MD;  Location: [facility];  Service: General;  Laterality: N/A;  . COLONOSCOPY  01/07/2004   Dr. Silvano Rusk  . EYE SURGERY    . PROSTATECTOMY    . TONSILLECTOMY     Family History  Problem Relation Age of Onset  . Prostate cancer Father   . Pancreatic cancer Mother   . Kidney cancer Mother   . Prostate cancer Brother   . Prostate cancer Paternal Uncle   . Prostate cancer Paternal Grandfather   . Prostate cancer Paternal Uncle   . Prostate cancer Paternal Uncle   .  Prostate cancer Paternal Uncle   . Colon cancer Neg Hx    Social History   Socioeconomic History  . Marital status: Married    Spouse name: Not on file  . Number of children: 1  . Years of education: Not on file  . Highest education level: Not on file  Occupational History  . Occupation: retired  Tobacco Use  . Smoking status: Former Smoker    Packs/day: 0.50    Years: 20.00    Pack years: 10.00    Types: Cigarettes    Quit date: 10/29/1981    Years since quitting: 38.8  . Smokeless tobacco: Never Used  Vaping Use  . Vaping Use: Never used  Substance and Sexual Activity  . Alcohol use: No  . Drug use: No  . Sexual activity: Yes  Other  Topics Concern  . Not on file  Social History Narrative   Married to [employer]. Retired. 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            Social Determinants of Health   Financial Resource Strain:   . Difficulty of Paying Living Expenses: Not on file  Food Insecurity:   . Worried About Charity fundraiser in the Last Year: Not on file  . Ran Out of Food in the Last Year: Not on file  Transportation Needs:   . Lack of Transportation (Medical): Not on file  . Lack of Transportation (Non-Medical): Not on file  Physical Activity:   . Days of Exercise per Week: Not on file  . Minutes of Exercise per Session: Not on file  Stress:   . Feeling of Stress : Not on file  Social Connections:   . Frequency of Communication with Friends and Family: Not on file  . Frequency of Social Gatherings with Friends and Family: Not on file  . Attends Religious Services: Not on file  . Active Member of Clubs or Organizations: Not on file  . Attends Archivist Meetings: Not on file  . Marital Status: Not on file    Tobacco Counseling Counseling given: Not Answered   Clinical Intake:     Pain Score: 0-No pain           Diabetic?no         Activities of Daily Living No flowsheet data found.  Patient Care Team: [name], MD as PCP - Dorisann Frames, MD (Urology)  Indicate any recent Medical Services you may have received from other than [facility] providers in the past year (date may be approximate).     Assessment:   This is a routine wellness examination for [name].  Hearing/Vision screen No exam data present  Dietary issues and exercise activities discussed:    Goals   None    Depression Screen PHQ 2/9 Scores 08/30/2020 09/16/2019 09/09/2018 02/03/2018 09/04/2017 08/29/2016 10/12/2015  PHQ - 2 Score 0 0 0 0 0 0 0  PHQ- 9 Score 0 - - - - - -    Fall Risk Fall Risk  09/23/2019  09/16/2019 02/03/2018 09/04/2017 08/29/2016  Falls in the past year? 0 0 No No No  Comment Emmi Telephone Survey: data to providers prior to load - - - -  Number falls in past yr: - 0 - - -  Injury with Fall? - - - - -    No falls.  Normal gait  Cognitive Function: Normal  Immunizations Immunization History  Administered Date(s) Administered  . Fluad Quad(high Dose 65+) 09/16/2019  . Influenza Split 11/05/2012  . Influenza,inj,Quad PF,6+ Mos 07/29/2013, 08/11/2014, 08/24/2015, 08/29/2016, 09/04/2017, 09/09/2018, 08/30/2020  . PFIZER SARS-COV-2 Vaccination 12/10/2019, 12/31/2019, 07/26/2020  . Pneumococcal Conjugate-13 08/24/2015  . Pneumococcal Polysaccharide-23 02/21/2011  . Td 12/04/2003  . Tdap 03/15/2014  . Zoster 01/15/2008  . Zoster Recombinat (Shingrix) 11/11/2018, 04/29/2019   Qualifies for Shingles Vaccine? No   Zostavax completed Yes   Shingrix Completed?: Yes  Screening Tests Health Maintenance  Topic Date Due  . DTAP VACCINES (1) 08/27/1946  . COLONOSCOPY  03/17/2023  . DTaP/Tdap/Td (3 - Td or Tdap) 03/15/2024  . TETANUS/TDAP  03/15/2024  . INFLUENZA VACCINE  Completed  . COVID-19 Vaccine  Completed  . Hepatitis C Screening  Completed  . PNA vac Low Risk Adult  Completed    Health Maintenance  Health Maintenance Due  Topic Date Due  . DTAP VACCINES (1) 08/27/1946   Current on all screening.  Has ophthomologist  Dental Screening: Recommended annual dental exams for proper oral hygiene  Community Resource Referral / Chronic Care Management: CRR required this visit?  No   CCM required this visit?  No   Exam Mobility:able to get up and down from exam table without assistance or distress Heart - Regular rate and rhythm.  No murmurs, gallops or rubs.    Lungs:  Normal respiratory effort, chest expands symmetrically. Lungs are clear to auscultation, no crackles or wheezes. Abdomen: soft and non-tender without masses, organomegaly or hernias  noted.  No guarding or rebound Extremities:  No cyanosis, edema, or deformity noted with good range of motion of all major joints.   Skin:  Intact without suspicious lesions or rashes Neck:  No deformities, thyromegaly, masses, or tenderness noted.   Supple with full range of motion without pain. Mouth - no lesions, mucous membranes are moist, no decaying teeth      Plan:     I have personally reviewed and noted the following in the patient's chart:   . Medical and social history . Use of alcohol, tobacco or illicit drugs  . Current medications and supplements . Functional ability and status . Nutritional status . Physical activity . Advanced directives . List of other physicians . Hospitalizations, surgeries, and ER visits in previous 12 months . Vitals . Screenings to include cognitive, depression, and falls . Referrals and appointments  In addition, I have reviewed and discussed with patient certain preventive protocols, quality metrics, and best practice recommendations. A written personalized care plan for preventive services as well as general preventive health recommendations were provided to patient.     [name], MD   08/31/2020

## 2020-12-06 ENCOUNTER — Other Ambulatory Visit (HOSPITAL_COMMUNITY): Payer: Self-pay | Admitting: Urology

## 2020-12-06 DIAGNOSIS — C61 Malignant neoplasm of prostate: Secondary | ICD-10-CM

## 2020-12-06 DIAGNOSIS — R911 Solitary pulmonary nodule: Secondary | ICD-10-CM

## 2020-12-06 DIAGNOSIS — C7951 Secondary malignant neoplasm of bone: Secondary | ICD-10-CM

## 2020-12-06 DIAGNOSIS — R9721 Rising PSA following treatment for malignant neoplasm of prostate: Secondary | ICD-10-CM

## 2021-01-23 ENCOUNTER — Telehealth: Payer: Self-pay | Admitting: Family Medicine

## 2021-01-23 NOTE — Telephone Encounter (Signed)
Reviewed form and placed in PCP's box for completion.  .Latha Staunton R Zaydee Aina, CMA  

## 2021-01-23 NOTE — Telephone Encounter (Signed)
[employer] form dropped off for at front desk for completion.  Verified that patient section of form has been completed.  Last DOS/WCC with PCP was 09/19/20.  Placed form in team folder to be completed by clinical staff.  Creig Hines

## 2021-01-26 NOTE — Telephone Encounter (Signed)
Patient called and informed that forms are ready for pick up. Copy made and placed in batch scanning. Original placed at front desk for pick up.   Jaxson Anglin C Glendel Jaggers, RN  

## 2021-01-26 NOTE — Telephone Encounter (Signed)
Signed and placed in box.   

## 2021-01-30 ENCOUNTER — Other Ambulatory Visit: Payer: Self-pay

## 2021-01-30 ENCOUNTER — Ambulatory Visit (HOSPITAL_COMMUNITY)
Admission: RE | Admit: 2021-01-30 | Discharge: 2021-01-30 | Disposition: A | Discharge status: Home / Self Care | Payer: Medicare HMO | Source: Ambulatory Visit | Attending: Urology | Admitting: Urology

## 2021-01-30 DIAGNOSIS — C7951 Secondary malignant neoplasm of bone: Secondary | ICD-10-CM | POA: Diagnosis present

## 2021-01-30 DIAGNOSIS — C61 Malignant neoplasm of prostate: Secondary | ICD-10-CM | POA: Insufficient documentation

## 2021-01-30 DIAGNOSIS — R911 Solitary pulmonary nodule: Secondary | ICD-10-CM | POA: Diagnosis present

## 2021-01-30 DIAGNOSIS — R9721 Rising PSA following treatment for malignant neoplasm of prostate: Secondary | ICD-10-CM | POA: Insufficient documentation

## 2021-01-30 MED ORDER — PIFLIFOLASTAT F 18 (PYLARIFY) INJECTION
9.0000 | Freq: Once | INTRAVENOUS | Status: AC
  Administered 2021-01-30: 8.6 via INTRAVENOUS

## 2021-02-25 ENCOUNTER — Encounter: Payer: Self-pay | Admitting: Family Medicine

## 2021-08-24 ENCOUNTER — Encounter: Payer: Self-pay | Admitting: Family Medicine

## 2021-09-05 ENCOUNTER — Ambulatory Visit (INDEPENDENT_AMBULATORY_CARE_PROVIDER_SITE_OTHER): Payer: Medicare HMO | Admitting: Family Medicine

## 2021-09-05 ENCOUNTER — Other Ambulatory Visit: Payer: Self-pay

## 2021-09-05 VITALS — BP 125/70 | HR 55 | Temp 98.8°F | Wt 239.0 lb

## 2021-09-05 DIAGNOSIS — Z23 Encounter for immunization: Secondary | ICD-10-CM

## 2021-09-05 DIAGNOSIS — D729 Disorder of white blood cells, unspecified: Secondary | ICD-10-CM

## 2021-09-05 DIAGNOSIS — M158 Other polyosteoarthritis: Secondary | ICD-10-CM

## 2021-09-05 DIAGNOSIS — Z13220 Encounter for screening for lipoid disorders: Secondary | ICD-10-CM | POA: Diagnosis not present

## 2021-09-05 NOTE — Progress Notes (Deleted)
Subjective:   Jared [name] is a 75 y.o. male who presents for Medicare Annual/Subsequent preventive examination.  The patient consented to a virtual visit. Patient consented to have virtual visit and was identified by name and date of birth. Method of visit: {TELEPHONE VS VKPQA:44975} Encounter participants: Patient: Jared [name] - located at *** Nurse/Provider: Dorna Bloom - located at *** Others (if applicable): ***   Review of Systems:  ***       Objective:    Vitals: There were no vitals taken for this visit.  There is no height or weight on file to calculate BMI.  Advanced Directives 09/16/2019 09/09/2018 09/04/2017 08/29/2016 10/12/2015 08/24/2015 07/14/2013  Does Patient Have a Medical Advance Directive? No No Yes Yes Yes Yes Patient does not have advance directive  Type of Advance Directive - - Living will Living will [place];Living will [place];Living will -  Does patient want to make changes to medical advance directive? - - Yes (Inpatient - patient defers changing a medical advance directive at this time) - - - -  Copy of [place] in Chart? - - - Yes Yes Yes -  Would patient like information on creating a medical advance directive? No - Patient declined No - Patient declined - - - - -  Pre-existing out of facility DNR order (yellow form or pink MOST form) - - - - - - -    Tobacco Social History   Tobacco Use  Smoking Status Former   Packs/day: 0.50   Years: 20.00   Pack years: 10.00   Types: Cigarettes   Quit date: 10/29/1981   Years since quitting: 39.8  Smokeless Tobacco Never     Counseling given: Not Answered   Clinical Intake:                       Past Medical History:  Diagnosis Date   Arthritis    SHOULDER & KNEES   Chronic kidney disease    Inguinal hernia    right   Prostate cancer ([place]) 1990   Past Surgical History:  Procedure Laterality Date   CATARACT  EXTRACTION     CHOLECYSTECTOMY N/A 07/14/2013   Procedure: LAPAROSCOPIC CHOLECYSTECTOMY;  Surgeon: Stark Klein, MD;  Location: [facility];  Service: General;  Laterality: N/A;   COLONOSCOPY  01/07/2004   Dr. Silvano Rusk   EYE SURGERY     PROSTATECTOMY     TONSILLECTOMY     Family History  Problem Relation Age of Onset   Prostate cancer Father    Pancreatic cancer Mother    Kidney cancer Mother    Prostate cancer Brother    Prostate cancer Paternal Uncle    Prostate cancer Paternal Grandfather    Prostate cancer Paternal Uncle    Prostate cancer Paternal Uncle    Prostate cancer Paternal Uncle    Colon cancer Neg Hx    Social History   Socioeconomic History   Marital status: Married    Spouse name: Not on file   Number of children: 1   Years of education: Not on file   Highest education level: Not on file  Occupational History   Occupation: retired  Tobacco Use   Smoking status: Former    Packs/day: 0.50    Years: 20.00    Pack years: 10.00    Types: Cigarettes    Quit date: 10/29/1981    Years since quitting: 11.8  Smokeless tobacco: Never  Vaping Use   Vaping Use: Never used  Substance and Sexual Activity   Alcohol use: No   Drug use: No   Sexual activity: Yes  Other Topics Concern   Not on file  Social History Narrative   Married to [employer]. Retired. College Graduate of [employer] where he met his wife.Worked in [place] then moved back to [place] once retired.            Social Determinants of Health   Financial Resource Strain: Not on file  Food Insecurity: Not on file  Transportation Needs: Not on file  Physical Activity: Not on file  Stress: Not on file  Social Connections: Not on file    Outpatient Encounter Medications as of 09/05/2021  Medication Sig   Ascorbic Acid (VITAMIN C) 500 MG tablet Take 500 mg by mouth daily.     Calcium Carbonate Antacid (TUMS ULTRA 1000 PO) Take 2 tablets by mouth 2 (two) times daily.   cholecalciferol (VITAMIN D3) 25 MCG  (1000 UNIT) tablet    Garlic 6195 MG CAPS Take 1 capsule by mouth daily. Hold while in hospital   ibuprofen (ADVIL,MOTRIN) 800 MG tablet Take 1 tablet (800 mg total) by mouth every 8 (eight) hours as needed.   Multiple Vitamins-Minerals (CENTRUM SILVER PO) Take 1 tablet by mouth daily.     Tafluprost (ZIOPTAN) 0.0015 % SOLN Apply 1 drop to eye every morning.   timolol (TIMOPTIC) 0.5 % ophthalmic solution Place 1 drop into the left eye daily.    Triptorelin Pamoate 11.25 MG SUSR Inject 11.25 mg into the muscle every 3 (three) months. Take injections quarterly   No facility-administered encounter medications on file as of 09/05/2021.    Activities of Daily Living No flowsheet data found.  Patient Care Team: [name], MD as PCP - Dorisann Frames, MD (Urology)   Assessment:   This is a routine wellness examination for Jared [name].  Exercise Activities and Dietary recommendations     Goals   None     Fall Risk Fall Risk  09/23/2019 09/16/2019 02/03/2018 09/04/2017 08/29/2016  Falls in the past year? 0 0 No No No  Comment Emmi Telephone Survey: data to providers prior to load - - - -  Number falls in past yr: - 0 - - -  Injury with Fall? - - - - -   Is the patient's home free of loose throw rugs in walkways, pet beds, electrical cords, etc?   {Blank single:19197::"yes","no"}      Grab bars in the bathroom? {Blank single:19197::"yes","no"}      Handrails on the stairs?   {Blank single:19197::"yes","no"}      Adequate lighting?   {Blank single:19197::"yes","no"}  Timed Get Up and Go Performed: *** Patient rating of health (0-10): ***   Depression Screen PHQ 2/9 Scores 08/30/2020 09/16/2019 09/09/2018 02/03/2018  PHQ - 2 Score 0 0 0 0  PHQ- 9 Score 0 - - -    Cognitive Function        Immunization History  Administered Date(s) Administered   Fluad Quad(high Dose 65+) 09/16/2019   Influenza Split 11/05/2012   Influenza,inj,Quad PF,6+ Mos 07/29/2013, 08/11/2014,  08/24/2015, 08/29/2016, 09/04/2017, 09/09/2018, 08/30/2020   PFIZER(Purple Top)SARS-COV-2 Vaccination 07/26/2020   Pneumococcal Conjugate-13 08/24/2015   Pneumococcal Polysaccharide-23 02/21/2011   Td 12/04/2003   Tdap 03/15/2014   Unspecified SARS-COV-2 Vaccination 02/24/2021, 08/24/2021   Zoster Recombinat (Shingrix) 11/11/2018, 04/29/2019   Zoster, Live 01/15/2008    Screening Tests  Health Maintenance  Topic Date Due   DTAP VACCINES (1) 08/27/1946   INFLUENZA VACCINE  05/29/2021   COVID-19 Vaccine (6 - Booster for Pfizer series) 10/19/2021   DTaP/Tdap/Td (3 - Td or Tdap) 03/15/2024   TETANUS/TDAP  03/15/2024   Pneumonia Vaccine 43+ Years old  Completed   Hepatitis C Screening  Completed   Zoster Vaccines- Shingrix  Completed   HPV VACCINES  Aged Out   Cancer Screenings: Lung: Low Dose CT Chest recommended if Age 1-80 years, 30 pack-year currently smoking OR have quit w/in 15years. Patient {DOES NOT does:27190::"does not"} qualify. Colorectal: ***  Additional Screenings: ***       Plan:   ***  I have personally reviewed and noted the following in the patient's chart:   Medical and social history Use of alcohol, tobacco or illicit drugs  Current medications and supplements Functional ability and status Nutritional status Physical activity Advanced directives List of other physicians Hospitalizations, surgeries, and ER visits in previous 12 months Vitals Screenings to include cognitive, depression, and falls Referrals and appointments  In addition, I have reviewed and discussed with patient certain preventive protocols, quality metrics, and best practice recommendations. A written personalized care plan for preventive services as well as general preventive health recommendations were provided to patient.    This visit was conducted virtually in the setting of the [place] pandemic.    Dorna Bloom, CMA  09/05/2021

## 2021-09-05 NOTE — Progress Notes (Signed)
Subjective:   Jared [name] is a 75 y.o. male who presents for Medicare Annual/Subsequent preventive examination      Objective:    Vitals: BP 125/70   Pulse (!) 55   Temp 98.8 F (37.1 C)   Wt 239 lb (108.4 kg)   SpO2 98%   BMI 32.41 kg/m   Body mass index is 32.41 kg/m.  Advanced Directives 09/16/2019 09/09/2018 09/04/2017 08/29/2016 10/12/2015 08/24/2015 07/14/2013  Does Patient Have a Medical Advance Directive? No No Yes Yes Yes Yes Patient does not have advance directive  Type of Advance Directive - - Living will Living will [place];Living will [place];Living will -  Does patient want to make changes to medical advance directive? - - Yes (Inpatient - patient defers changing a medical advance directive at this time) - - - -  Copy of [place] in Chart? - - - Yes Yes Yes -  Would patient like information on creating a medical advance directive? No - Patient declined No - Patient declined - - - - -  Pre-existing out of facility DNR order (yellow form or pink MOST form) - - - - - - -    Tobacco Social History   Tobacco Use  Smoking Status Former   Packs/day: 0.50   Years: 20.00   Pack years: 10.00   Types: Cigarettes   Quit date: 10/29/1981   Years since quitting: 39.8  Smokeless Tobacco Never     Counseling given: Not Answered   Clinical Intake:     Pain Score: 0-No pain          Past Medical History:  Diagnosis Date   Arthritis    SHOULDER & KNEES   Chronic kidney disease    Inguinal hernia    right   Prostate cancer ([place]) 1990   Past Surgical History:  Procedure Laterality Date   CATARACT EXTRACTION     CHOLECYSTECTOMY N/A 07/14/2013   Procedure: LAPAROSCOPIC CHOLECYSTECTOMY;  Surgeon: Stark Klein, MD;  [facility];  Service: General;  Laterality: N/A;   COLONOSCOPY  01/07/2004   Dr. Silvano Rusk   EYE SURGERY     PROSTATECTOMY     TONSILLECTOMY     Family History  Problem  Relation Age of Onset   Prostate cancer Father    Pancreatic cancer Mother    Kidney cancer Mother    Prostate cancer Brother    Prostate cancer Paternal Uncle    Prostate cancer Paternal Grandfather    Prostate cancer Paternal Uncle    Prostate cancer Paternal Uncle    Prostate cancer Paternal Uncle    Colon cancer Neg Hx    Social History   Socioeconomic History   Marital status: Married    Spouse name: Not on file   Number of children: 1   Years of education: Not on file   Highest education level: Not on file  Occupational History   Occupation: retired  Tobacco Use   Smoking status: Former    Packs/day: 0.50    Years: 20.00    Pack years: 10.00    Types: Cigarettes    Quit date: 10/29/1981    Years since quitting: 39.8   Smokeless tobacco: Never  Vaping Use   Vaping Use: Never used  Substance and Sexual Activity   Alcohol use: No   Drug use: No   Sexual activity: Yes  Other Topics Concern   Not on file  Social History  Narrative   Married to Jared [name]. Retired. College Graduate of [employer] where he met his wife.Worked in [place] then moved back to [place] once retired.            Social Determinants of Health   Financial Resource Strain: Not on file  Food Insecurity: Not on file  Transportation Needs: Not on file  Physical Activity: Not on file  Stress: Not on file  Social Connections: Not on file    Outpatient Encounter Medications as of 09/05/2021  Medication Sig   Ascorbic Acid (VITAMIN C) 500 MG tablet Take 500 mg by mouth daily.     Calcium Carbonate Antacid (TUMS ULTRA 1000 PO) Take 2 tablets by mouth 2 (two) times daily.   cholecalciferol (VITAMIN D3) 25 MCG (1000 UNIT) tablet    Garlic 9518 MG CAPS Take 1 capsule by mouth daily. Hold while in hospital   ibuprofen (ADVIL,MOTRIN) 800 MG tablet Take 1 tablet (800 mg total) by mouth every 8 (eight) hours as needed.   Multiple Vitamins-Minerals (CENTRUM SILVER PO) Take 1 tablet by mouth daily.      Triptorelin Pamoate 11.25 MG SUSR Inject 11.25 mg into the muscle every 3 (three) months. Take injections quarterly   [DISCONTINUED] Tafluprost (ZIOPTAN) 0.0015 % SOLN Apply 1 drop to eye every morning.   [DISCONTINUED] timolol (TIMOPTIC) 0.5 % ophthalmic solution Place 1 drop into the left eye daily.    No facility-administered encounter medications on file as of 09/05/2021.    Activities of Daily Living No flowsheet data found.  Patient Care Team: [name], MD as PCP - Dorisann Frames, MD (Urology)   Assessment:   This is a routine wellness examination for Jared [name].  Exercise Activities and Dietary recommendations     Goals   None     Fall Risk Fall Risk  09/23/2019 09/16/2019 02/03/2018 09/04/2017 08/29/2016  Falls in the past year? 0 0 No No No  Comment Emmi Telephone Survey: data to providers prior to load - - - -  Number falls in past yr: - 0 - - -  Injury with Fall? - - - - -   Is the patient's home free of loose throw rugs in walkways, pet beds, electrical cords, etc?   yes      Grab bars in the bathroom? yes      Handrails on the stairs?   yes      Adequate lighting?   yes   Depression Screen PHQ 2/9 Scores 08/30/2020 09/16/2019 09/09/2018 02/03/2018  PHQ - 2 Score 0 0 0 0  PHQ- 9 Score 0 - - -    Cognitive Function        Immunization History  Administered Date(s) Administered   Fluad Quad(high Dose 65+) 09/16/2019   Influenza Split 11/05/2012   Influenza,inj,Quad PF,6+ Mos 07/29/2013, 08/11/2014, 08/24/2015, 08/29/2016, 09/04/2017, 09/09/2018, 08/30/2020   PFIZER(Purple Top)SARS-COV-2 Vaccination 07/26/2020   Pneumococcal Conjugate-13 08/24/2015   Pneumococcal Polysaccharide-23 02/21/2011   Td 12/04/2003   Tdap 03/15/2014   Unspecified SARS-COV-2 Vaccination 02/24/2021, 08/24/2021   Zoster Recombinat (Shingrix) 11/11/2018, 04/29/2019   Zoster, Live 01/15/2008    Screening Tests Health Maintenance  Topic Date Due   DTAP VACCINES (1)  08/27/1946   INFLUENZA VACCINE  05/29/2021   COVID-19 Vaccine (6 - Booster for Pfizer series) 10/19/2021   DTaP/Tdap/Td (3 - Td or Tdap) 03/15/2024   TETANUS/TDAP  03/15/2024   Pneumonia Vaccine 36+ Years old  Completed   Hepatitis C Screening  Completed  Zoster Vaccines- Shingrix  Completed   HPV VACCINES  Aged Out   Cancer Screenings: Lung: Low Dose CT Chest recommended if Age 55-80 years, 30 pack-year currently smoking OR have quit w/in 15years. Patient does not qualify. Colorectal: current  Additional Screenings: none       Plan:   I have personally reviewed and noted the following in the patient's chart:   Medical and social history Use of alcohol, tobacco or illicit drugs  Current medications and supplements Functional ability and status Nutritional status Physical activity Advanced directives List of other physicians Hospitalizations, surgeries, and ER visits in previous 12 months Vitals Screenings to include cognitive, depression, and falls Referrals and appointments  In addition, I have reviewed and discussed with patient certain preventive protocols, quality metrics, and best practice recommendations. A written personalized care plan for preventive services as well as general preventive health recommendations were provided to patient.   [name], MD  09/05/2021  Exam Heart - Regular rate and rhythm.  No murmurs, gallops or rubs.    Lungs:  Normal respiratory effort, chest expands symmetrically. Lungs are clear to auscultation, no crackles or wheezes. Abdomen: soft and non-tender without masses, organomegaly or hernias noted.  No guarding or rebound Neck:  No deformities, thyromegaly, masses, or tenderness noted.   Supple with full range of motion without pain. Extremities:  No cyanosis, edema, or deformity noted with good range of motion of all major joints.   Skin:  Intact without suspicious lesions or rashes  Mobility:able to get up and down from  exam table without assistance or distress  AP See Problem list

## 2021-09-05 NOTE — Assessment & Plan Note (Signed)
Stable - keeps active and rarely uses nsaids

## 2021-09-05 NOTE — Assessment & Plan Note (Signed)
Normal the last few years and no significant infections

## 2021-09-05 NOTE — Patient Instructions (Signed)
Good to see you today - Thank you for coming in  Things we discussed today:  I will call you if your tests are not good.  Otherwise, I will send you a message on MyChart (if it is active) or a letter in the mail..  If you do not hear from me with in 2 weeks please call our office.     Keep doing what you are doing

## 2021-09-06 DIAGNOSIS — E785 Hyperlipidemia, unspecified: Secondary | ICD-10-CM

## 2021-09-06 HISTORY — DX: Hyperlipidemia, unspecified: E78.5

## 2021-09-06 LAB — LIPID PANEL
Chol/HDL Ratio: 2.9 ratio (ref 0.0–5.0)
Cholesterol, Total: 241 mg/dL — ABNORMAL HIGH (ref 100–199)
HDL: 82 mg/dL (ref >39)
LDL Chol Calc (NIH): 148 mg/dL — ABNORMAL HIGH (ref 0–99)
Triglycerides: 67 mg/dL (ref 0–149)
VLDL Cholesterol Cal: 11 mg/dL (ref 5–40)

## 2021-09-08 ENCOUNTER — Encounter: Payer: Self-pay | Admitting: Family Medicine

## 2021-09-15 ENCOUNTER — Other Ambulatory Visit: Payer: Self-pay | Admitting: Family Medicine

## 2021-09-15 DIAGNOSIS — E785 Hyperlipidemia, unspecified: Secondary | ICD-10-CM

## 2021-10-29 DIAGNOSIS — C259 Malignant neoplasm of pancreas, unspecified: Secondary | ICD-10-CM

## 2021-10-29 HISTORY — DX: Malignant neoplasm of pancreas, unspecified: C25.9

## 2022-02-26 HISTORY — PX: COLONOSCOPY: SHX174

## 2022-03-20 ENCOUNTER — Other Ambulatory Visit (INDEPENDENT_AMBULATORY_CARE_PROVIDER_SITE_OTHER): Payer: Medicare HMO

## 2022-03-20 ENCOUNTER — Ambulatory Visit (INDEPENDENT_AMBULATORY_CARE_PROVIDER_SITE_OTHER): Payer: Medicare HMO | Admitting: Gastroenterology

## 2022-03-20 ENCOUNTER — Encounter: Payer: Self-pay | Admitting: Gastroenterology

## 2022-03-20 VITALS — BP 130/90 | HR 61 | Ht 73.0 in | Wt 250.0 lb

## 2022-03-20 DIAGNOSIS — R1084 Generalized abdominal pain: Secondary | ICD-10-CM

## 2022-03-20 LAB — COMPREHENSIVE METABOLIC PANEL
ALT: 20 U/L (ref 0–53)
AST: 20 U/L (ref 0–37)
Albumin: 4.4 g/dL (ref 3.5–5.2)
Alkaline Phosphatase: 58 U/L (ref 39–117)
BUN: 14 mg/dL (ref 6–23)
CO2: 30 mEq/L (ref 19–32)
Calcium: 10.1 mg/dL (ref 8.4–10.5)
Chloride: 105 mEq/L (ref 96–112)
Creatinine, Ser: 0.86 mg/dL (ref 0.40–1.50)
GFR: 83.98 mL/min (ref >60.00)
Glucose, Bld: 105 mg/dL — ABNORMAL HIGH (ref 70–99)
Potassium: 4.3 mEq/L (ref 3.5–5.1)
Sodium: 142 mEq/L (ref 135–145)
Total Bilirubin: 0.5 mg/dL (ref 0.2–1.2)
Total Protein: 7.2 g/dL (ref 6.0–8.3)

## 2022-03-20 LAB — CBC WITH DIFFERENTIAL/PLATELET
Basophils Absolute: 0 10*3/uL (ref 0.0–0.1)
Basophils Relative: 0.6 % (ref 0.0–3.0)
Eosinophils Absolute: 0.2 10*3/uL (ref 0.0–0.7)
Eosinophils Relative: 3.8 % (ref 0.0–5.0)
HCT: 42.2 % (ref 39.0–52.0)
Hemoglobin: 13.8 g/dL (ref 13.0–17.0)
Lymphocytes Relative: 31.9 % (ref 12.0–46.0)
Lymphs Abs: 1.4 10*3/uL (ref 0.7–4.0)
MCHC: 32.8 g/dL (ref 30.0–36.0)
MCV: 85.7 fl (ref 78.0–100.0)
Monocytes Absolute: 0.6 10*3/uL (ref 0.1–1.0)
Monocytes Relative: 14.6 % — ABNORMAL HIGH (ref 3.0–12.0)
Neutro Abs: 2.2 10*3/uL (ref 1.4–7.7)
Neutrophils Relative %: 49.1 % (ref 43.0–77.0)
Platelets: 180 10*3/uL (ref 150.0–400.0)
RBC: 4.92 Mil/uL (ref 4.22–5.81)
RDW: 14.3 % (ref 11.5–15.5)
WBC: 4.4 10*3/uL (ref 4.0–10.5)

## 2022-03-20 NOTE — Patient Instructions (Addendum)
Your provider has requested that you go to the basement level for lab work before leaving today. Press "B" on the elevator. The lab is located at the first door on the left as you exit the elevator.   You have been scheduled for a colonoscopy. Please follow written instructions given to you at your visit today.  Please pick up your prep supplies at the pharmacy within the next 1-3 days. If you use inhalers (even only as needed), please bring them with you on the day of your procedure.  If you are age 35 or older, your body mass index should be between 23-30. Your Body mass index is 32.98 kg/m. If this is out of the aforementioned range listed, please consider follow up with your Primary Care Provider.  If you are age 7 or younger, your body mass index should be between 19-25. Your Body mass index is 32.98 kg/m. If this is out of the aformentioned range listed, please consider follow up with your Primary Care Provider.   ________________________________________________________  The [facility] providers would like to encourage you to use [facility] to communicate with providers for non-urgent requests or questions.  Due to long hold times on the telephone, sending your provider a message by [facility] may be a faster and more efficient way to get a response.  Please allow 48 business hours for a response.  Please remember that this is for non-urgent requests.  _______________________________________________________  Thank you for entrusting me with your care and for choosing [employer],  Alonza Bogus, P.A. - C.

## 2022-03-20 NOTE — Progress Notes (Signed)
03/20/2022 Jared [name] [id number] [birth year]   HISTORY OF PRESENT ILLNESS: This is a 76 year old male who is a patient of Dr. Celesta Aver.  Not seen here since his last colonoscopy in 2014.  He had a colonoscopy here in 2005 and one in 2014 that were both normal.  He tells me he knows he is due for his colonoscopy next year, but he wanted to see about having it done sooner because of some intermittent abdominal pains that he has been experiencing.  He describes intermittent mid/generalized abdominal discomfort that comes and goes.  Usually within 15 to 20 minutes of taking a Pepcid or ibuprofen the pain dissipates and goes away.  Sometimes it improves after having a bowel movement.  He says that his bowels move regularly.  If he eats a lot of dark chocolate he notices that his stools will be dark, but then it goes back to regular color the next day.  No blood in his stools.   Past Medical History:  Diagnosis Date   Arthritis    SHOULDER & KNEES   Chronic kidney disease    Inguinal hernia    right   Prostate cancer ([place]) 10/29/1988   Past Surgical History:  Procedure Laterality Date   CATARACT EXTRACTION     CHOLECYSTECTOMY N/A 07/14/2013   Procedure: LAPAROSCOPIC CHOLECYSTECTOMY;  Surgeon: Stark Klein, MD;  Location: [facility];  Service: General;  Laterality: N/A;   COLONOSCOPY  01/07/2004   Dr. Silvano Rusk   EYE SURGERY     PROSTATECTOMY     TONSILLECTOMY      reports that he quit smoking about 40 years ago. His smoking use included cigarettes. He has a 10.00 pack-year smoking history. He has never used smokeless tobacco. He reports that he does not drink alcohol and does not use drugs. family history includes Kidney cancer in his mother; Pancreatic cancer in his mother; Prostate cancer in his brother, father, paternal grandfather, paternal uncle, paternal uncle, paternal uncle, and paternal uncle. Allergies  Allergen Reactions   Claritin [Loratadine] Other (See Comments)     Increases pressure in eyes   Milk-Related Compounds Other (See Comments)    INDIGESTION   Lupron [Leuprolide] Itching      Outpatient Encounter Medications as of 03/20/2022  Medication Sig   Ascorbic Acid (VITAMIN C) 500 MG tablet Take 500 mg by mouth daily.     Calcium Carbonate Antacid (TUMS ULTRA 1000 PO) Take 2 tablets by mouth 2 (two) times daily.   Carboxymethylcell-Glycerin PF (REFRESH OPTIVE PF) 0.5-0.9 % SOLN Place 1 drop into both eyes as needed.   cholecalciferol (VITAMIN D3) 25 MCG (1000 UNIT) tablet    Garlic 3474 MG CAPS Take 1 capsule by mouth daily. Hold while in hospital   GEMTESA 75 MG TABS Take 1 tablet by mouth daily.   ibuprofen (ADVIL,MOTRIN) 800 MG tablet Take 1 tablet (800 mg total) by mouth every 8 (eight) hours as needed.   Multiple Vitamins-Minerals (CENTRUM SILVER PO) Take 1 tablet by mouth daily.     olopatadine (PATANOL) 0.1 % ophthalmic solution Place 1 drop into both eyes 2 (two) times daily as needed.   Triptorelin Pamoate 11.25 MG SUSR Inject 11.25 mg into the muscle every 3 (three) months. Take injections quarterly   No facility-administered encounter medications on file as of 03/20/2022.     REVIEW OF SYSTEMS  : All other systems reviewed and negative except where noted in the History of Present Illness.   PHYSICAL  EXAM: BP 130/90   Pulse 61   Ht '6\' 1"'$  (1.854 m)   Wt 250 lb (113.4 kg)   BMI 32.98 kg/m  General: Well developed AA male in no acute distress Head: Normocephalic and atraumatic Eyes:  Sclerae anicteric, conjunctiva pink. Ears: Normal auditory acuity Lungs: Clear throughout to auscultation; no W/R/R. Heart: Regular rate and rhythm; no M/R/G. Abdomen: Soft, non-distended.  BS present.  Non-tender. Rectal:  Will be done at the time of colonoscopy. Musculoskeletal: Symmetrical with no gross deformities  Skin: No lesions on visible extremities Extremities: No edema  Neurological: Alert oriented x 4, grossly non-focal Psychological:   Alert and cooperative. Normal mood and affect  ASSESSMENT AND PLAN: *Generalized abdominal pain: Gets intermittent generalized/mid abdominal discomfort that is relieved by taking a Pepcid or and ibuprofen, sometimes relieved by bowel movement.  Really no other associated symptoms.  He is due for colonoscopy next year, but wanted to see about getting it done sooner because of his pain.  We will plan for colonoscopy with Dr. Carlean Purl.  The risks, benefits, and alternatives to colonoscopy were discussed with the patient and he consents to proceed.  He has not had any basic labs performed in quite some time.  We will check a CBC and CMP today.   CC:  [name], *

## 2022-03-23 ENCOUNTER — Other Ambulatory Visit: Payer: Medicare HMO

## 2022-03-23 DIAGNOSIS — E785 Hyperlipidemia, unspecified: Secondary | ICD-10-CM

## 2022-03-24 LAB — LIPID PANEL
Chol/HDL Ratio: 3 ratio (ref 0.0–5.0)
Cholesterol, Total: 210 mg/dL — ABNORMAL HIGH (ref 100–199)
HDL: 71 mg/dL (ref >39)
LDL Chol Calc (NIH): 126 mg/dL — ABNORMAL HIGH (ref 0–99)
Triglycerides: 74 mg/dL (ref 0–149)
VLDL Cholesterol Cal: 13 mg/dL (ref 5–40)

## 2022-03-27 ENCOUNTER — Ambulatory Visit (AMBULATORY_SURGERY_CENTER): Payer: Medicare HMO | Admitting: Internal Medicine

## 2022-03-27 ENCOUNTER — Encounter: Payer: Self-pay | Admitting: Internal Medicine

## 2022-03-27 VITALS — BP 101/68 | HR 56 | Temp 96.2°F | Resp 12 | Ht 73.0 in | Wt 235.8 lb

## 2022-03-27 DIAGNOSIS — R1084 Generalized abdominal pain: Secondary | ICD-10-CM | POA: Diagnosis not present

## 2022-03-27 DIAGNOSIS — D124 Benign neoplasm of descending colon: Secondary | ICD-10-CM

## 2022-03-27 DIAGNOSIS — K5730 Diverticulosis of large intestine without perforation or abscess without bleeding: Secondary | ICD-10-CM

## 2022-03-27 DIAGNOSIS — D125 Benign neoplasm of sigmoid colon: Secondary | ICD-10-CM | POA: Diagnosis not present

## 2022-03-27 NOTE — Op Note (Signed)
[facility] Patient Name: Jared [name] Procedure Date: 03/27/2022 9:05 AM MRN: [id number] Endoscopist: Gatha Mayer , MD Age: 76 Referring MD:  Date of Birth: 27-May-1946 Gender: Male Account #: [id number] Procedure:                Colonoscopy Indications:              Generalized abdominal pain Medicines:                Monitored Anesthesia Care Procedure:                Pre-Anesthesia Assessment:                           - Prior to the procedure, a History and Physical                            was performed, and patient medications and                            allergies were reviewed. The patient's tolerance of                            previous anesthesia was also reviewed. The risks                            and benefits of the procedure and the sedation                            options and risks were discussed with the patient.                            All questions were answered, and informed consent                            was obtained. Prior Anticoagulants: The patient has                            taken no previous anticoagulant or antiplatelet                            agents. ASA Grade Assessment: II - A patient with                            mild systemic disease. After reviewing the risks                            and benefits, the patient was deemed in                            satisfactory condition to undergo the procedure.                           After obtaining informed consent, the colonoscope  was passed under direct vision. Throughout the                            procedure, the patient's blood pressure, pulse, and                            oxygen saturations were monitored continuously. The                            Olympus CF-HQ190L [id number]) Colonoscope was                            introduced through the anus and advanced to the the                            terminal ileum, with identification of  the                            appendiceal orifice and IC valve. The colonoscopy                            was performed without difficulty. The patient                            tolerated the procedure well. The quality of the                            bowel preparation was good. The terminal ileum,                            ileocecal valve, appendiceal orifice, and rectum                            were photographed. The bowel preparation used was                            Miralax via split dose instruction. 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                           Two sessile polyps were found in the sigmoid colon                            and descending colon. The polyps were diminutive in                            size. These polyps were removed with a cold snare.  Resection and retrieval were complete. Verification                            of patient identification for the specimen was                            done. Estimated blood loss was minimal.                           Multiple diverticula were found in the sigmoid                            colon and descending colon.                           The exam was otherwise without abnormality on                            direct and retroflexion views.                           The terminal ileum appeared normal. Complications:            No immediate complications. Estimated Blood Loss:     Estimated blood loss was minimal. 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                           - Two diminutive polyps in the sigmoid colon and in                            the descending  colon, removed with a cold snare.                            Resected and retrieved.                           - Diverticulosis in the sigmoid colon and in the                            descending colon.                           - The examination was otherwise normal on direct                            and retroflexion views.                           - The examined portion of the ileum was normal. Recommendation:           - Patient has a contact number available for  emergencies. The signs and symptoms of potential                            delayed complications were discussed with the                            patient. Return to normal activities tomorrow.                            Written discharge instructions were provided to the                            patient.                           - Resume previous diet.                           - Continue present medications.                           - Await pathology results.                           - No recommendation at this time regarding repeat                            colonoscopy due to age. Unlikely to recommend                            routine repeat - await pathology review                           - Try bid OTC Pepcid x 1 momth - if abdominal pain                            not resolved then return to GI clinic Gatha Mayer, MD 03/27/2022 9:46:12 AM This report has been signed electronically.

## 2022-03-27 NOTE — Progress Notes (Signed)
History and Physical Interval Note:  03/27/2022 9:08 AM  Jared [name]  has presented today for endoscopic procedure(s), with the diagnosis of  Encounter Diagnosis  Name Primary?   Generalized abdominal pain Yes  .  The various methods of evaluation and treatment have been discussed with the patient and/or family. After consideration of risks, benefits and other options for treatment, the patient has consented to  the endoscopic procedure(s).   The patient's history has been reviewed, patient examined, no change in status, stable for endoscopic procedure(s).  I have reviewed the patient's chart and labs.  Questions were answered to the patient's satisfaction.      Gatha Mayer, MD, [facility]

## 2022-03-27 NOTE — Progress Notes (Signed)
Called to room to assist during endoscopic procedure.  Patient ID and intended procedure confirmed with present staff. Received instructions for my participation in the procedure from the performing physician.  

## 2022-03-27 NOTE — Patient Instructions (Addendum)
I found 2 tiny polyps and removed them and saw diverticulosis. I do not think either of these are related to the abdominal pains.  I suggest taking Pepcid 2 times a day for 1 month.  If that does not fix the pain then come back to see [place].  I appreciate the opportunity to care for you. Gatha Mayer, MD, [facility]  Read all of the handouts given to you by your recovery room nurse.   YOU HAD AN ENDOSCOPIC PROCEDURE TODAY AT [facility]:   Refer to the procedure report that was given to you for any specific questions about what was found during the examination.  If the procedure report does not answer your questions, please call your gastroenterologist to clarify.  If you requested that your care partner not be given the details of your procedure findings, then the procedure report has been included in a sealed envelope for you to review at your convenience later.  YOU SHOULD EXPECT: Some feelings of bloating in the abdomen. Passage of more gas than usual.  Walking can help get rid of the air that was put into your GI tract during the procedure and reduce the bloating. If you had a lower endoscopy (such as a colonoscopy or flexible sigmoidoscopy) you may notice spotting of blood in your stool or on the toilet paper. If you underwent a bowel prep for your procedure, you may not have a normal bowel movement for a few days.  Please Note:  You might notice some irritation and congestion in your nose or some drainage.  This is from the oxygen used during your procedure.  There is no need for concern and it should clear up in a day or so.  SYMPTOMS TO REPORT IMMEDIATELY:  Following lower endoscopy (colonoscopy or flexible sigmoidoscopy):  Excessive amounts of blood in the stool  Significant tenderness or worsening of abdominal pains  Swelling of the abdomen that is new, acute  Fever of 100F or higher   For urgent or emergent issues, a gastroenterologist can be reached at any hour by  calling [phone]. Do not use MyChart messaging for urgent concerns.    DIET:  We do recommend a small meal at first, but then you may proceed to your regular diet.  Drink plenty of fluids but you should avoid alcoholic beverages for 24 hours.  ACTIVITY:  You should plan to take it easy for the rest of today and you should NOT DRIVE or use heavy machinery until tomorrow (because of the sedation medicines used during the test).    FOLLOW UP: Our staff will call the number listed on your records 48-72 hours following your procedure to check on you and address any questions or concerns that you may have regarding the information given to you following your procedure. If we do not reach you, we will leave a message.  We will attempt to reach you two times.  During this call, we will ask if you have developed any symptoms of COVID 19. If you develop any symptoms (ie: fever, flu-like symptoms, shortness of breath, cough etc.) before then, please call [phone].  If you test positive for Covid 19 in the 2 weeks post procedure, please call and report this information to [place].    If any biopsies were taken you will be contacted by phone or by letter within the next 1-3 weeks.  Please call us at [phone] if you have not heard about the biopsies in 3  weeks.    SIGNATURES/CONFIDENTIALITY: You and/or your care partner have signed paperwork which will be entered into your electronic medical record.  These signatures attest to the fact that that the information above on your After Visit Summary has been reviewed and is understood.  Full responsibility of the confidentiality of this discharge information lies with you and/or your care-partner.

## 2022-03-27 NOTE — Progress Notes (Signed)
Report to PACU, RN, vss, BBS= Clear.  

## 2022-03-28 ENCOUNTER — Telehealth: Payer: Self-pay

## 2022-03-28 NOTE — Telephone Encounter (Signed)
  Follow up Call-     03/27/2022    8:40 AM  Call back number  Post procedure Call Back phone  # [phone]  Permission to leave phone message Yes     Patient questions:  Do you have a fever, pain , or abdominal swelling? No. Pain Score  0 *  Have you tolerated food without any problems? Yes.    Have you been able to return to your normal activities? Yes.    Do you have any questions about your discharge instructions: Diet   No. Medications  No. Follow up visit  No.  Do you have questions or concerns about your Care? no  Actions: * If pain score is 4 or above: No action needed, pain <4.

## 2022-04-03 ENCOUNTER — Encounter: Payer: Self-pay | Admitting: *Deleted

## 2022-04-03 ENCOUNTER — Encounter: Payer: Self-pay | Admitting: Internal Medicine

## 2022-04-27 ENCOUNTER — Telehealth: Payer: Self-pay | Admitting: Internal Medicine

## 2022-04-27 NOTE — Telephone Encounter (Signed)
Patient called in with complaints of ongoing dull, upper-mid abdominal pain (3/10). He has been taking pepcid daily as recommended at the time of his colon on 5/30, and was told to call back if symptoms had not improved. He does take tylenol occasionally, which provides relief temporarily. Denies n/v. Bowel movements are normal. Patient would like further recommendations. Will route to MD.

## 2022-04-27 NOTE — Telephone Encounter (Signed)
Patient called requesting to speak with a nurse regarding persistent abd pain.

## 2022-04-27 NOTE — Telephone Encounter (Signed)
Patient scheduled for EGD in [place] with Dr. Carlean Purl on 05/28/22 at 4:00 pm. PV scheduled for 05/14/22 at 9:00 am. Patient states he is no on any blood thinners or diabetic. Patient advised to call back with any questions.

## 2022-04-27 NOTE — Telephone Encounter (Signed)
Please schedule EGD for upper abdominal pain

## 2022-05-14 ENCOUNTER — Ambulatory Visit (AMBULATORY_SURGERY_CENTER): Payer: Self-pay

## 2022-05-14 VITALS — Ht 73.0 in | Wt 247.0 lb

## 2022-05-14 DIAGNOSIS — R1084 Generalized abdominal pain: Secondary | ICD-10-CM

## 2022-05-14 NOTE — Progress Notes (Signed)
No egg or soy allergy known to patient  No issues known to pt with past sedation with any surgeries or procedures Patient denies ever being told they had issues or difficulty with intubation  No FH of Malignant Hyperthermia Pt is not on diet pills Pt is not on home 02  Pt is not on blood thinners  Pt denies issues with constipation  No A fib or A flutter Have any cardiac testing pending--NO Pt instructed to use Single[employer] or GoodRx for a price reduction on prep  Patient's wife present for PV appt;

## 2022-05-20 ENCOUNTER — Encounter: Payer: Self-pay | Admitting: Certified Registered Nurse Anesthetist

## 2022-05-23 ENCOUNTER — Encounter: Payer: Self-pay | Admitting: Internal Medicine

## 2022-05-28 ENCOUNTER — Ambulatory Visit (AMBULATORY_SURGERY_CENTER): Payer: Medicare HMO | Admitting: Internal Medicine

## 2022-05-28 ENCOUNTER — Encounter: Payer: Self-pay | Admitting: Internal Medicine

## 2022-05-28 VITALS — BP 119/84 | HR 44 | Temp 96.8°F | Resp 16 | Ht 73.0 in | Wt 237.6 lb

## 2022-05-28 DIAGNOSIS — R1010 Upper abdominal pain, unspecified: Secondary | ICD-10-CM

## 2022-05-28 DIAGNOSIS — K2960 Other gastritis without bleeding: Secondary | ICD-10-CM

## 2022-05-28 DIAGNOSIS — R1084 Generalized abdominal pain: Secondary | ICD-10-CM | POA: Diagnosis not present

## 2022-05-28 MED ORDER — SODIUM CHLORIDE 0.9 % IV SOLN: 500.0000 mL | Freq: Once | INTRAVENOUS | Status: DC

## 2022-05-28 NOTE — Patient Instructions (Addendum)
The stomach lining looks inflamed - I took biopsies to see if so and get more information. I will let you know results and recommendations.  No ulcers, signs of cancer seen.  I appreciate the opportunity to care for you. Gatha Mayer, MD, [facility]  Await pathology results from the biopsies taken today.  Resume previous diet and medications.  YOU HAD AN ENDOSCOPIC PROCEDURE TODAY AT [facility]:   Refer to the procedure report that was given to you for any specific questions about what was found during the examination.  If the procedure report does not answer your questions, please call your gastroenterologist to clarify.  If you requested that your care partner not be given the details of your procedure findings, then the procedure report has been included in a sealed envelope for you to review at your convenience later.  YOU SHOULD EXPECT: Some feelings of bloating in the abdomen. Passage of more gas than usual.  Walking can help get rid of the air that was put into your GI tract during the procedure and reduce the bloating. If you had a lower endoscopy (such as a colonoscopy or flexible sigmoidoscopy) you may notice spotting of blood in your stool or on the toilet paper. If you underwent a bowel prep for your procedure, you may not have a normal bowel movement for a few days.  Please Note:  You might notice some irritation and congestion in your nose or some drainage.  This is from the oxygen used during your procedure.  There is no need for concern and it should clear up in a day or so.  SYMPTOMS TO REPORT IMMEDIATELY:   Following upper endoscopy (EGD)  Vomiting of blood or coffee ground material  New chest pain or pain under the shoulder blades  Painful or persistently difficult swallowing  New shortness of breath  Fever of 100F or higher  Black, tarry-looking stools  For urgent or emergent issues, a gastroenterologist can be reached at any hour by calling [phone]  [phone]. Do not use MyChart messaging for urgent concerns.    DIET:  We do recommend a small meal at first, but then you may proceed to your regular diet.  Drink plenty of fluids but you should avoid alcoholic beverages for 24 hours.  ACTIVITY:  You should plan to take it easy for the rest of today and you should NOT DRIVE or use heavy machinery until tomorrow (because of the sedation medicines used during the test).    FOLLOW UP: Our staff will call the number listed on your records the next business day following your procedure.  We will call around 7:15- 8:00 am to check on you and address any questions or concerns that you may have regarding the information given to you following your procedure. If we do not reach you, we will leave a message.  If you develop any symptoms (ie: fever, flu-like symptoms, shortness of breath, cough etc.) before then, please call [phone].  If you test positive for Covid 19 in the 2 weeks post procedure, please call and report this information to [place].    If any biopsies were taken you will be contacted by phone or by letter within the next 1-3 weeks.  Please call us at [phone] if you have not heard about the biopsies in 3 weeks.    SIGNATURES/CONFIDENTIALITY: You and/or your care partner have signed paperwork which will be entered into your electronic medical record.  These signatures attest to  the fact that that the information above on your After Visit Summary has been reviewed and is understood.  Full responsibility of the confidentiality of this discharge information lies with you and/or your care-partner.

## 2022-05-28 NOTE — Progress Notes (Signed)
Report given to PACU, vss 

## 2022-05-28 NOTE — Progress Notes (Signed)
[facility] History and Physical   Primary Care Physician:  [name], MD   Reason for Procedure:   Upper abdominal pain  Plan:    EGD     HPI: Jared [name] is a 76 y.o. male w/ several months of dull upper abdominal pain. He had a colonoscopy 5/30 - diverticulosis and 2 polyps (adenomas).   Past Medical History:  Diagnosis Date   Arthritis    SHOULDER & KNEES   Cataract    bilateral sx   Chronic kidney disease    Inguinal hernia    right   Prostate cancer ([place]) 10/29/1988    Past Surgical History:  Procedure Laterality Date   CATARACT EXTRACTION Bilateral    CHOLECYSTECTOMY N/A 07/14/2013   Procedure: LAPAROSCOPIC CHOLECYSTECTOMY;  Surgeon: Stark Klein, MD;  Location: [facility];  Service: General;  Laterality: N/A;   COLONOSCOPY  01/07/2004   Dr. Silvano Rusk   COLONOSCOPY  02/2022   CG-MAC-mira(good)-tics-2 polyps   EYE SURGERY     INGUINAL HERNIA REPAIR Right    PROSTATECTOMY     TONSILLECTOMY      Prior to Admission medications   Medication Sig Start Date End Date Taking? Authorizing Provider  ALPHAGAN P 0.15 % ophthalmic solution  03/05/22  Yes [provider]  Ascorbic Acid (VITAMIN C) 500 MG tablet Take 500 mg by mouth daily.     Yes [provider]  Calcium Carbonate Antacid (TUMS ULTRA 1000 PO) Take 2 tablets by mouth 2 (two) times daily.   Yes [provider]  Carboxymethylcell-Glycerin PF (REFRESH OPTIVE PF) 0.5-0.9 % SOLN Place 1 drop into both eyes as needed. 04/28/18  Yes [provider]  cholecalciferol (VITAMIN D3) 25 MCG (1000 UNIT) tablet    Yes [provider]  Dorzolamide HCl-Timolol Mal PF 2-0.5 % SOLN Apply 1 drop to eye 2 (two) times daily. 12/01/21  Yes [provider]  Garlic 7829 MG CAPS Take 1 capsule by mouth daily. Hold while in hospital   Yes [provider]  GEMTESA 75 MG TABS Take 1 tablet by mouth daily. 01/31/22  Yes [provider]  Multiple  Vitamins-Minerals (CENTRUM SILVER PO) Take 1 tablet by mouth daily.     Yes [provider]  ibuprofen (ADVIL,MOTRIN) 800 MG tablet Take 1 tablet (800 mg total) by mouth every 8 (eight) hours as needed. 09/09/18   [name], MD  Triptorelin Pamoate 11.25 MG SUSR Inject 11.25 mg into the muscle every 3 (three) months. Take injections quarterly    [provider]    Current Outpatient Medications  Medication Sig Dispense Refill   ALPHAGAN P 0.15 % ophthalmic solution      Ascorbic Acid (VITAMIN C) 500 MG tablet Take 500 mg by mouth daily.       Calcium Carbonate Antacid (TUMS ULTRA 1000 PO) Take 2 tablets by mouth 2 (two) times daily.     Carboxymethylcell-Glycerin PF (REFRESH OPTIVE PF) 0.5-0.9 % SOLN Place 1 drop into both eyes as needed.     cholecalciferol (VITAMIN D3) 25 MCG (1000 UNIT) tablet      Dorzolamide HCl-Timolol Mal PF 2-0.5 % SOLN Apply 1 drop to eye 2 (two) times daily.     Garlic 5621 MG CAPS Take 1 capsule by mouth daily. Hold while in hospital     GEMTESA 75 MG TABS Take 1 tablet by mouth daily.     Multiple Vitamins-Minerals (CENTRUM SILVER PO) Take 1 tablet by mouth daily.  ibuprofen (ADVIL,MOTRIN) 800 MG tablet Take 1 tablet (800 mg total) by mouth every 8 (eight) hours as needed. 30 tablet 1   Triptorelin Pamoate 11.25 MG SUSR Inject 11.25 mg into the muscle every 3 (three) months. Take injections quarterly     Current Facility-Administered Medications  Medication Dose Route Frequency Provider Last Rate Last Admin   0.9 %  sodium chloride infusion  500 mL Intravenous Once Gatha Mayer, MD        Allergies as of 05/28/2022 - Review Complete 05/28/2022  Allergen Reaction Noted   Claritin [loratadine] Other (See Comments) 09/19/2012   Milk-related compounds Other (See Comments) 03/16/2013   Lupron [leuprolide] Itching 03/20/2022    Family History  Problem Relation Age of Onset   Pancreatic cancer Mother    Kidney cancer Mother     Prostate cancer Father    Prostate cancer Brother    Prostate cancer Paternal Uncle    Prostate cancer Paternal Uncle    Prostate cancer Paternal Uncle    Prostate cancer Paternal Uncle    Prostate cancer Paternal Grandfather    Colon cancer Neg Hx    Stomach cancer Neg Hx    Esophageal cancer Neg Hx    Colon polyps Neg Hx    Rectal cancer Neg Hx     Social History   Socioeconomic History   Marital status: Married    Spouse name: Not on file   Number of children: 1   Years of education: Not on file   Highest education level: Not on file  Occupational History   Occupation: retired  Tobacco Use   Smoking status: Former    Packs/day: 0.50    Years: 20.00    Total pack years: 10.00    Types: Cigarettes    Quit date: 10/29/1981    Years since quitting: 40.6   Smokeless tobacco: Never  Vaping Use   Vaping Use: Never used  Substance and Sexual Activity   Alcohol use: No   Drug use: No   Sexual activity: Yes  Other Topics Concern   Not on file  Social History Narrative   Married to [employer]. Retired. 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            Social Determinants of Health   Financial Resource Strain: Not on file  Food Insecurity: Not on file  Transportation Needs: Not on file  Physical Activity: Not on file  Stress: Not on file  Social Connections: Not on file  Intimate Partner Violence: Not on file    Review of Systems:  All other review of systems negative except as mentioned in the HPI.  Physical Exam: Vital signs BP 118/63   Pulse (!) 52   Temp (!) 96.8 F (36 C) (Temporal)   Ht _0  (1.854 m)   Wt 237 lb 9.6 oz (107.8 kg)   SpO2 98%   BMI 31.35 kg/m   General:   Alert,  Well-developed, well-nourished, pleasant and cooperative in NAD Lungs:  Clear throughout to auscultation.   Heart:  Regular rate and rhythm; no murmurs, clicks, rubs,  or gallops. Abdomen:  Soft,  nontender and nondistended. Normal bowel sounds.   Neuro/Psych:  Alert and cooperative. Normal mood and affect. A and O x 3   _1  E. Carlean Purl, MD, [facility] [phone] (pager) 05/28/2022 4:26 PM@

## 2022-05-28 NOTE — Progress Notes (Signed)
1630 Robinul 0.1 mg IV given due large amount of secretions upon assessment.  MD made aware, vss

## 2022-05-28 NOTE — Op Note (Signed)
[place] Patient Name: Jared [name] Procedure Date: 05/28/2022 4:31 PM MRN: [id number] Endoscopist: Gatha Mayer , MD Age: 76 Referring MD:  Date of Birth: 20-Jul-1946 Gender: Male Account #: [id number] Procedure:                Upper GI endoscopy Indications:              Upper abdominal pain Medicines:                Monitored Anesthesia Care Procedure:                Pre-Anesthesia Assessment:                           - Prior to the procedure, a History and Physical                            was performed, and patient medications and                            allergies were reviewed. The patient's tolerance of                            previous anesthesia was also reviewed. The risks                            and benefits of the procedure and the sedation                            options and risks were discussed with the patient.                            All questions were answered, and informed consent                            was obtained. Prior Anticoagulants: The patient has                            taken no previous anticoagulant or antiplatelet                            agents. ASA Grade Assessment: II - A patient with                            mild systemic disease. After reviewing the risks                            and benefits, the patient was deemed in                            satisfactory condition to undergo the procedure.                           After obtaining informed consent, the endoscope was  passed under direct vision. Throughout the                            procedure, the patient's blood pressure, pulse, and                            oxygen saturations were monitored continuously. The                            GIF [id number] #[id number] was introduced through the                            mouth, and advanced to the second part of duodenum.                            The upper GI endoscopy was accomplished  without                            difficulty. The patient tolerated the procedure                            well. Scope In: Scope Out: Findings:                 Patchy mild mucosal changes characterized by                            congestion, discoloration and erythema were found                            in the gastric antrum. Biopsies were taken with a                            cold forceps for histology. Verification of patient                            identification for the specimen was done. Estimated                            blood loss was minimal.                           The Z-line was irregular.                           The cardia and gastric fundus were normal on                            retroflexion.                           The exam was otherwise without abnormality. Complications:            No immediate complications. Estimated Blood Loss:     Estimated blood loss was minimal. Impression:               -  Congested, discolored and erythematous mucosa in                            the antrum. Biopsied. ? gastritis, r/o H pylori                           - Z-line irregular. A couple of tiny columnar                            islands also.                           - The examination was otherwise normal. Recommendation:           - Patient has a contact number available for                            emergencies. The signs and symptoms of potential                            delayed complications were discussed with the                            patient. Return to normal activities tomorrow.                            Written discharge instructions were provided to the                            patient.                           - Resume previous diet.                           - Continue present medications.                           - Await pathology results. Gatha Mayer, MD 05/28/2022 4:48:29 PM This report has been signed electronically.

## 2022-05-28 NOTE — Progress Notes (Signed)
Called to room to assist during endoscopic procedure.  Patient ID and intended procedure confirmed with present staff. Received instructions for my participation in the procedure from the performing physician.  

## 2022-05-29 ENCOUNTER — Telehealth: Payer: Self-pay

## 2022-05-29 NOTE — Telephone Encounter (Signed)
  Follow up Call-     05/28/2022    3:21 PM 05/28/2022    3:20 PM 03/27/2022    8:40 AM  Call back number  Post procedure Call Back phone  # [phone]  [phone]  Permission to leave phone message Yes Yes Yes     Patient questions:  Do you have a fever, pain , or abdominal swelling? No. Pain Score  0 *  Have you tolerated food without any problems? Yes.    Have you been able to return to your normal activities? Yes.    Do you have any questions about your discharge instructions: Diet   No. Medications  No. Follow up visit  No.  Do you have questions or concerns about your Care? No.  Actions: * If pain score is 4 or above: No action needed, pain <4.

## 2022-06-04 ENCOUNTER — Encounter: Payer: Self-pay | Admitting: Internal Medicine

## 2022-06-05 ENCOUNTER — Telehealth: Payer: Self-pay | Admitting: Internal Medicine

## 2022-06-05 ENCOUNTER — Other Ambulatory Visit: Payer: Self-pay | Admitting: *Deleted

## 2022-06-07 ENCOUNTER — Other Ambulatory Visit: Payer: Self-pay

## 2022-06-07 ENCOUNTER — Encounter: Payer: Self-pay | Admitting: Internal Medicine

## 2022-06-07 DIAGNOSIS — R1084 Generalized abdominal pain: Secondary | ICD-10-CM

## 2022-06-07 MED ORDER — OMEPRAZOLE 40 MG PO CPDR: 40.0000 mg | DELAYED_RELEASE_CAPSULE | Freq: Every day | ORAL | 0 refills | Status: DC

## 2022-06-07 NOTE — Telephone Encounter (Signed)
Have him do CT abd/pelvis with contrast re: generalized abdominal pain, please - to check further  Also he can try omeprazole 40 mg daily #90 - no refill

## 2022-06-07 NOTE — Telephone Encounter (Signed)
PT called about persistant stomach pain. Needs relief and is wanting to know can any type of medication can be prescribed to him. Please advise.

## 2022-06-07 NOTE — Telephone Encounter (Signed)
Pt was made aware of Dr. Carlean Purl recommendations: Prescription sent to pt pharmacy: Pt made aware: Pt was ordered and scheduled a CT scan for 06/14/2022 at 3:30 at [facility]: Pt to arrive at 3:00: Nothing to eat or drink 4 hours prior. Pt to Pick up prep from [facility] prior to the date of the CT scan . Pt needs labs prior to date of CT scan. Order for lab placed in Epic: Pt made aware. Location to lab given: Pt made aware Pt verbalized understanding with all questions answered.

## 2022-06-07 NOTE — Telephone Encounter (Signed)
Pt stated that he is still having on going abdominal pain. Pt stated that he had an Colonoscopy and then an EGD done. Pt states that he has been taking Pepcid and its not relieving any of the abdominal pain: Please advise

## 2022-06-11 ENCOUNTER — Other Ambulatory Visit (INDEPENDENT_AMBULATORY_CARE_PROVIDER_SITE_OTHER): Payer: Medicare HMO

## 2022-06-11 DIAGNOSIS — R1084 Generalized abdominal pain: Secondary | ICD-10-CM

## 2022-06-11 LAB — BASIC METABOLIC PANEL
BUN: 9 mg/dL (ref 6–23)
CO2: 27 mEq/L (ref 19–32)
Calcium: 9.1 mg/dL (ref 8.4–10.5)
Chloride: 105 mEq/L (ref 96–112)
Creatinine, Ser: 0.79 mg/dL (ref 0.40–1.50)
GFR: 86.02 mL/min (ref >60.00)
Glucose, Bld: 119 mg/dL — ABNORMAL HIGH (ref 70–99)
Potassium: 4.5 mEq/L (ref 3.5–5.1)
Sodium: 141 mEq/L (ref 135–145)

## 2022-06-14 ENCOUNTER — Ambulatory Visit (HOSPITAL_COMMUNITY)
Admission: RE | Admit: 2022-06-14 | Discharge: 2022-06-14 | Disposition: A | Discharge status: Home / Self Care | Payer: Medicare HMO | Source: Ambulatory Visit | Attending: Internal Medicine | Admitting: Internal Medicine

## 2022-06-14 DIAGNOSIS — R1084 Generalized abdominal pain: Secondary | ICD-10-CM | POA: Insufficient documentation

## 2022-06-14 MED ORDER — IOHEXOL 300 MG/ML  SOLN
100.0000 mL | Freq: Once | INTRAMUSCULAR | Status: AC | PRN
  Administered 2022-06-14: 100 mL via INTRAVENOUS

## 2022-06-14 MED ORDER — IOHEXOL 9 MG/ML PO SOLN
1000.0000 mL | Freq: Once | ORAL | Status: AC
  Administered 2022-06-14: 1000 mL via ORAL

## 2022-06-14 MED ORDER — SODIUM CHLORIDE (PF) 0.9 % IJ SOLN
INTRAMUSCULAR | Status: AC
  Filled 2022-06-14: qty 50

## 2022-06-18 ENCOUNTER — Telehealth: Payer: Self-pay | Admitting: Internal Medicine

## 2022-06-18 ENCOUNTER — Other Ambulatory Visit (INDEPENDENT_AMBULATORY_CARE_PROVIDER_SITE_OTHER): Payer: Medicare HMO

## 2022-06-18 DIAGNOSIS — R1013 Epigastric pain: Secondary | ICD-10-CM

## 2022-06-18 DIAGNOSIS — K8689 Other specified diseases of pancreas: Secondary | ICD-10-CM

## 2022-06-18 LAB — CBC
HCT: 42.6 % (ref 39.0–52.0)
Hemoglobin: 14.1 g/dL (ref 13.0–17.0)
MCHC: 33 g/dL (ref 30.0–36.0)
MCV: 87.3 fl (ref 78.0–100.0)
Platelets: 188 10*3/uL (ref 150.0–400.0)
RBC: 4.88 Mil/uL (ref 4.22–5.81)
RDW: 14.5 % (ref 11.5–15.5)
WBC: 4.5 10*3/uL (ref 4.0–10.5)

## 2022-06-18 LAB — COMPREHENSIVE METABOLIC PANEL
ALT: 15 U/L (ref 0–53)
AST: 17 U/L (ref 0–37)
Albumin: 4.3 g/dL (ref 3.5–5.2)
Alkaline Phosphatase: 61 U/L (ref 39–117)
BUN: 14 mg/dL (ref 6–23)
CO2: 28 mEq/L (ref 19–32)
Calcium: 9.4 mg/dL (ref 8.4–10.5)
Chloride: 105 mEq/L (ref 96–112)
Creatinine, Ser: 0.73 mg/dL (ref 0.40–1.50)
GFR: 88.09 mL/min (ref >60.00)
Glucose, Bld: 119 mg/dL — ABNORMAL HIGH (ref 70–99)
Potassium: 4.2 mEq/L (ref 3.5–5.1)
Sodium: 141 mEq/L (ref 135–145)
Total Bilirubin: 0.5 mg/dL (ref 0.2–1.2)
Total Protein: 6.9 g/dL (ref 6.0–8.3)

## 2022-06-18 LAB — LIPASE: Lipase: 61 U/L — ABNORMAL HIGH (ref 11.0–59.0)

## 2022-06-18 LAB — AMYLASE: Amylase: 69 U/L (ref 27–131)

## 2022-06-18 NOTE — Telephone Encounter (Signed)
Called patient and reviewed that CT suggests pancreatic cancer  Labs ordered and he will do today  I will ask my partner about setting up endoscopic ultrasound for diagnosis

## 2022-06-20 ENCOUNTER — Other Ambulatory Visit: Payer: Self-pay

## 2022-06-20 DIAGNOSIS — K8689 Other specified diseases of pancreas: Secondary | ICD-10-CM

## 2022-06-20 LAB — CA 19-9 (SERIAL): CA 19-9: 767 U/mL — ABNORMAL HIGH (ref 0–35)

## 2022-06-20 NOTE — Telephone Encounter (Signed)
The pt has been scheduled for EUS on 07/05/22 at [facility] at 730 am   EUS scheduled, pt instructed and medications reviewed.  Patient instructions mailed to home and sent to My Chart.  Patient to call with any questions or concerns.

## 2022-06-27 ENCOUNTER — Encounter (HOSPITAL_COMMUNITY): Payer: Self-pay | Admitting: Gastroenterology

## 2022-07-04 NOTE — Anesthesia Preprocedure Evaluation (Addendum)
Anesthesia Evaluation  Patient identified by MRN, date of birth, ID band Patient awake    Reviewed: Allergy & Precautions, NPO status , Patient's Chart, lab work & pertinent test results  History of Anesthesia Complications Negative for: history of anesthetic complications  Airway Mallampati: II  TM Distance: >3 FB Neck ROM: Full    Dental  (+) Teeth Intact   Pulmonary neg pulmonary ROS, former smoker,    Pulmonary exam normal        Cardiovascular negative cardio ROS Normal cardiovascular exam     Neuro/Psych negative neurological ROS     GI/Hepatic Neg liver ROS, GERD  ,Pancreatic mass   Endo/Other  negative endocrine ROS  Renal/GU negative Renal ROS   H/o prostate cancer    Musculoskeletal  (+) Arthritis ,   Abdominal   Peds  Hematology negative hematology ROS (+)   Anesthesia Other Findings   Reproductive/Obstetrics                            Anesthesia Physical Anesthesia Plan  ASA: 2  Anesthesia Plan: MAC   Post-op Pain Management: Minimal or no pain anticipated   Induction: Intravenous  PONV Risk Score and Plan: 1 and Propofol infusion, TIVA and Treatment may vary due to age or medical condition  Airway Management Planned: Natural Airway, Nasal Cannula and Simple Face Mask  Additional Equipment: None  Intra-op Plan:   Post-operative Plan:   Informed Consent: I have reviewed the patients History and Physical, chart, labs and discussed the procedure including the risks, benefits and alternatives for the proposed anesthesia with the patient or authorized representative who has indicated his/her understanding and acceptance.       Plan Discussed with:   Anesthesia Plan Comments:        Anesthesia Quick Evaluation

## 2022-07-05 ENCOUNTER — Other Ambulatory Visit: Payer: Self-pay

## 2022-07-05 ENCOUNTER — Encounter (HOSPITAL_COMMUNITY): Payer: Self-pay | Admitting: Gastroenterology

## 2022-07-05 ENCOUNTER — Telehealth: Payer: Self-pay

## 2022-07-05 ENCOUNTER — Ambulatory Visit (HOSPITAL_BASED_OUTPATIENT_CLINIC_OR_DEPARTMENT_OTHER): Payer: Medicare HMO | Admitting: Certified Registered Nurse Anesthetist

## 2022-07-05 ENCOUNTER — Ambulatory Visit (HOSPITAL_COMMUNITY)
Admission: RE | Admit: 2022-07-05 | Discharge: 2022-07-05 | Disposition: A | Discharge status: Home / Self Care | Payer: Medicare HMO | Attending: Gastroenterology | Admitting: Gastroenterology

## 2022-07-05 ENCOUNTER — Encounter (HOSPITAL_COMMUNITY)
Admission: RE | Disposition: A | Discharge status: Home / Self Care | Payer: Self-pay | Source: Home / Self Care | Attending: Gastroenterology

## 2022-07-05 ENCOUNTER — Ambulatory Visit (HOSPITAL_COMMUNITY): Payer: Medicare HMO | Admitting: Certified Registered Nurse Anesthetist

## 2022-07-05 DIAGNOSIS — K3189 Other diseases of stomach and duodenum: Secondary | ICD-10-CM | POA: Diagnosis not present

## 2022-07-05 DIAGNOSIS — K838 Other specified diseases of biliary tract: Secondary | ICD-10-CM

## 2022-07-05 DIAGNOSIS — I899 Noninfective disorder of lymphatic vessels and lymph nodes, unspecified: Secondary | ICD-10-CM | POA: Diagnosis not present

## 2022-07-05 DIAGNOSIS — K8050 Calculus of bile duct without cholangitis or cholecystitis without obstruction: Secondary | ICD-10-CM | POA: Insufficient documentation

## 2022-07-05 DIAGNOSIS — K8689 Other specified diseases of pancreas: Secondary | ICD-10-CM

## 2022-07-05 DIAGNOSIS — Z87891 Personal history of nicotine dependence: Secondary | ICD-10-CM | POA: Insufficient documentation

## 2022-07-05 DIAGNOSIS — K449 Diaphragmatic hernia without obstruction or gangrene: Secondary | ICD-10-CM | POA: Insufficient documentation

## 2022-07-05 DIAGNOSIS — K2970 Gastritis, unspecified, without bleeding: Secondary | ICD-10-CM | POA: Diagnosis not present

## 2022-07-05 DIAGNOSIS — K2289 Other specified disease of esophagus: Secondary | ICD-10-CM

## 2022-07-05 DIAGNOSIS — C251 Malignant neoplasm of body of pancreas: Secondary | ICD-10-CM | POA: Insufficient documentation

## 2022-07-05 DIAGNOSIS — M1990 Unspecified osteoarthritis, unspecified site: Secondary | ICD-10-CM

## 2022-07-05 HISTORY — PX: ESOPHAGOGASTRODUODENOSCOPY (EGD) WITH PROPOFOL: SHX5813

## 2022-07-05 HISTORY — PX: EUS: SHX5427

## 2022-07-05 HISTORY — PX: FINE NEEDLE ASPIRATION: SHX5430

## 2022-07-05 LAB — COMPREHENSIVE METABOLIC PANEL
ALT: 16 U/L (ref 0–44)
AST: 19 U/L (ref 15–41)
Albumin: 3.6 g/dL (ref 3.5–5.0)
Alkaline Phosphatase: 49 U/L (ref 38–126)
Anion gap: 9 (ref 5–15)
BUN: 11 mg/dL (ref 8–23)
CO2: 25 mmol/L (ref 22–32)
Calcium: 9.2 mg/dL (ref 8.9–10.3)
Chloride: 108 mmol/L (ref 98–111)
Creatinine, Ser: 0.63 mg/dL (ref 0.61–1.24)
GFR, Estimated: >60 mL/min (ref >60)
Glucose, Bld: 123 mg/dL — ABNORMAL HIGH (ref 70–99)
Potassium: 3.4 mmol/L — ABNORMAL LOW (ref 3.5–5.1)
Sodium: 142 mmol/L (ref 135–145)
Total Bilirubin: 0.5 mg/dL (ref 0.3–1.2)
Total Protein: 6 g/dL — ABNORMAL LOW (ref 6.5–8.1)

## 2022-07-05 SURGERY — ESOPHAGOGASTRODUODENOSCOPY (EGD) WITH PROPOFOL
Anesthesia: Monitor Anesthesia Care

## 2022-07-05 MED ORDER — LIDOCAINE 2% (20 MG/ML) 5 ML SYRINGE
INTRAMUSCULAR | Status: DC | PRN
  Administered 2022-07-05: 60 mg via INTRAVENOUS
  Administered 2022-07-05: 40 mg via INTRAVENOUS

## 2022-07-05 MED ORDER — PROPOFOL 500 MG/50ML IV EMUL
INTRAVENOUS | Status: DC | PRN
  Administered 2022-07-05: 125 ug/kg/min via INTRAVENOUS

## 2022-07-05 MED ORDER — LACTATED RINGERS IV SOLN
INTRAVENOUS | Status: AC | PRN
  Administered 2022-07-05: 1000 mL via INTRAVENOUS

## 2022-07-05 MED ORDER — SODIUM CHLORIDE 0.9 % IV SOLN: INTRAVENOUS | Status: DC

## 2022-07-05 MED ORDER — PROPOFOL 10 MG/ML IV BOLUS
INTRAVENOUS | Status: DC | PRN
  Administered 2022-07-05 (×3): 10 mg via INTRAVENOUS

## 2022-07-05 SURGICAL SUPPLY — 15 items

## 2022-07-05 NOTE — H&P (Signed)
GASTROENTEROLOGY PROCEDURE H&P NOTE   Primary Care Physician: Jared Covert, MD  HPI: Jared [name] is a 76 y.o. male who presents for EGD/EUS to evaluate pancreatic lesion on CT imaging.  Past Medical History:  Diagnosis Date   Arthritis    SHOULDER & KNEES   Cataract    bilateral sx   Chronic kidney disease    Inguinal hernia    right   Prostate cancer ([place]) 10/29/1988   Past Surgical History:  Procedure Laterality Date   CATARACT EXTRACTION Bilateral    CHOLECYSTECTOMY N/A 07/14/2013   Procedure: LAPAROSCOPIC CHOLECYSTECTOMY;  Surgeon: Stark Klein, MD;  Location: [facility];  Service: General;  Laterality: N/A;   COLONOSCOPY  01/07/2004   Dr. Silvano Rusk   COLONOSCOPY  02/2022   [facility]-mira(good)-tics-2 polyps   EYE SURGERY     INGUINAL HERNIA REPAIR Right    PROSTATECTOMY     TONSILLECTOMY     Current Facility-Administered Medications  Medication Dose Route Frequency Provider Last Rate Last Admin   0.9 %  sodium chloride infusion   Intravenous Continuous Mansouraty, Telford Nab., MD       lactated ringers infusion    Continuous PRN Mansouraty, Telford Nab., MD 10 mL/hr at 07/05/22 0651 1,000 mL at 07/05/22 0651    Current Facility-Administered Medications:    0.9 %  sodium chloride infusion, , Intravenous, Continuous, Mansouraty, Telford Nab., MD   lactated ringers infusion, , , Continuous PRN, Mansouraty, Telford Nab., MD, Last Rate: 10 mL/hr at 07/05/22 0651, 1,000 mL at 07/05/22 2229 Allergies  Allergen Reactions   Claritin [Loratadine] Other (See Comments)    Increases pressure in eyes   Milk-Related Compounds Other (See Comments)    INDIGESTION   Lupron [Leuprolide] Itching   Family History  Problem Relation Age of Onset   Pancreatic cancer Mother    Kidney cancer Mother    Prostate cancer Father    Prostate cancer Brother    Prostate cancer Paternal Uncle    Prostate cancer Paternal Uncle    Prostate cancer Paternal Uncle    Prostate cancer  Paternal Uncle    Prostate cancer Paternal Grandfather    Colon cancer Neg Hx    Stomach cancer Neg Hx    Esophageal cancer Neg Hx    Colon polyps Neg Hx    Rectal cancer Neg Hx    Social History   Socioeconomic History   Marital status: Married    Spouse name: Not on file   Number of children: 1   Years of education: Not on file   Highest education level: Not on file  Occupational History   Occupation: retired  Tobacco Use   Smoking status: Former    Packs/day: 0.50    Years: 20.00    Total pack years: 10.00    Types: Cigarettes    Quit date: 10/29/1981    Years since quitting: 40.7   Smokeless tobacco: Never  Vaping Use   Vaping Use: Never used  Substance and Sexual Activity   Alcohol use: No   Drug use: No   Sexual activity: Yes  Other Topics Concern   Not on file  Social History Narrative   Married to [employer]. Retired. 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            Social Determinants of Health   Financial Resource Strain: Not on file  Food Insecurity: Not on file  Transportation Needs: Not on file  Physical Activity: Not on file  Stress: Not on file  Social Connections: Not on file  Intimate Partner Violence: Not on file    Physical Exam: Today's Vitals   07/05/22 0648  BP: (!) 141/83  Pulse: (!) 56  Resp: 18  Temp: 98.2 F (36.8 C)  SpO2: 99%  Weight: 106.6 kg  Height: 6' 1" (1.854 m)  PainSc: 2    Body mass index is 31 kg/m. GEN: NAD EYE: Sclerae anicteric ENT: MMM CV: Non-tachycardic GI: Soft, mild TTP 3/10 currently NEURO:  Alert & Oriented x 3  Lab Results: No results for input(s): "WBC", "HGB", "HCT", "PLT" in the last 72 hours. BMET No results for input(s): "NA", "K", "CL", "CO2", "GLUCOSE", "BUN", "CREATININE", "CALCIUM" in the last 72 hours. LFT No results for input(s): "PROT", "ALBUMIN", "AST", "ALT", "ALKPHOS", "BILITOT", "BILIDIR", "IBILI" in the last 72  hours. PT/INR No results for input(s): "LABPROT", "INR" in the last 72 hours.   Impression / Plan: This is a 76 y.o.male who presents for EGD/EUS to evaluate pancreatic lesion on CT imaging.  Marland KitchenHubbard [name]   The risks and benefits of endoscopic evaluation/treatment were discussed with the patient and/or family; these include but are not limited to the risk of perforation, infection, bleeding, missed lesions, lack of diagnosis, severe illness requiring hospitalization, as well as anesthesia and sedation related illnesses.  The patient's history has been reviewed, patient examined, no change in status, and deemed stable for procedure.  The patient and/or family is agreeable to proceed.    Justice Britain, MD [facility] Office # [phone]

## 2022-07-05 NOTE — Anesthesia Postprocedure Evaluation (Signed)
Anesthesia Post Note  Patient: Jared [name]  Procedure(s) Performed: ESOPHAGOGASTRODUODENOSCOPY (EGD) WITH PROPOFOL UPPER ENDOSCOPIC ULTRASOUND (EUS) RADIAL FINE NEEDLE ASPIRATION (FNA) LINEAR     Patient location during evaluation: PACU Anesthesia Type: MAC Level of consciousness: awake and alert Pain management: pain level controlled Vital Signs Assessment: post-procedure vital signs reviewed and stable Respiratory status: spontaneous breathing, nonlabored ventilation and respiratory function stable Cardiovascular status: blood pressure returned to baseline and stable Postop Assessment: no apparent nausea or vomiting Anesthetic complications: no   No notable events documented.  Last Vitals:  Vitals:   07/05/22 0931 07/05/22 0941  BP:    Pulse: (!) 45   Resp: (!) 21   Temp:  36.5 C  SpO2: 99%     Last Pain:  Vitals:   07/05/22 0941  PainSc: 0-No pain                 Lidia Collum

## 2022-07-05 NOTE — Telephone Encounter (Signed)
Patient calling late asking if he can take ibuprofen 800 mg for his arthritic pain. He had an EUS today. He is not experiencing any abdominal pain. States he feels fine or the same as before.  Spoke with Dr Rush Landmark. The patient is okay to take a single dose for tonight. Patient advised.

## 2022-07-05 NOTE — Telephone Encounter (Signed)
-----   Message from Irving Copas., MD sent at 07/05/2022 12:43 PM EDT ----- Regarding: Needs ERCP CG, Hope you are doing well. EUS completed today. We will get pathology back by next week but this is extremely likely to be an underlying cancer based on our prelim pathology read. 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 These will need to be removed. He and wife are aware. CMP today shows no acute issues (he had gallbladder removed >20 years ago). Wonder if some of his discomfort is actually these stones. I am putting Jeffrie Lofstrom on here so she can work on scheduling with me in October (looks like my next non-urgent ERCP slot) and I'm happy to get him done. If you want to get this taken care of let us know. GM

## 2022-07-05 NOTE — Transfer of Care (Signed)
Immediate Anesthesia Transfer of Care Note  Patient: Jared [name]  Procedure(s) Performed: ESOPHAGOGASTRODUODENOSCOPY (EGD) WITH PROPOFOL UPPER ENDOSCOPIC ULTRASOUND (EUS) RADIAL FINE NEEDLE ASPIRATION (FNA) LINEAR  Patient Location: PACU  Anesthesia Type:MAC  Level of Consciousness: drowsy and patient cooperative  Airway & Oxygen Therapy: Patient Spontanous Breathing and Patient connected to nasal cannula oxygen  Post-op Assessment: Report given to RN and Post -op Vital signs reviewed and stable  Post vital signs: Reviewed and stable  Last Vitals:  Vitals Value Taken Time  BP 118/84 07/05/22 0851  Temp    Pulse 60 07/05/22 0852  Resp 20 07/05/22 0852  SpO2 99 % 07/05/22 0852  Vitals shown include unvalidated device data.  Last Pain:  Vitals:   07/05/22 0648  PainSc: 2          Complications: No notable events documented.

## 2022-07-05 NOTE — Op Note (Signed)
[facility] Patient Name: Jared [name] Procedure Date : 07/05/2022 MRN: [id number] Attending MD: Justice Britain , MD Date of Birth: 12/05/1945 CSN: [id number] Age: 76 Admit Type: Outpatient Procedure:                Upper EUS Indications:              Suspected mass in pancreas on CT scan, Epigastric                            abdominal distress Providers:                Justice Britain, MD, Jeanella Cara, RN,                            Gloris Ham, Technician Referring MD:             Gatha Mayer, MD Medicines:                Monitored Anesthesia Care Complications:            No immediate complications. Estimated Blood Loss:     Estimated blood loss was minimal. Procedure:                Pre-Anesthesia Assessment:                           - Prior to the procedure, a History and Physical                            was performed, and patient medications and                            allergies were reviewed. The patient's tolerance of                            previous anesthesia was also reviewed. The risks                            and benefits of the procedure and the sedation                            options and risks were discussed with the patient.                            All questions were answered, and informed consent                            was obtained. Prior Anticoagulants: The patient has                            taken no previous anticoagulant or antiplatelet                            agents. ASA Grade Assessment: III - A patient with  severe systemic disease. After reviewing the risks                            and benefits, the patient was deemed in                            satisfactory condition to undergo the procedure.                           After obtaining informed consent, the endoscope was                            passed under direct vision. Throughout the                             procedure, the patient's blood pressure, pulse, and                            oxygen saturations were monitored continuously. The                            [id number]-H190 ([id number]) Olympus endoscope was introduced                            through the mouth, and advanced to the second part                            of duodenum. The [id number]-Q190V ([id number]) Olympus                            duodenoscope was introduced through the mouth, and                            advanced to the area of papilla. The [id number]                            ([id number]) Olympus linear ultrasound scope was                            introduced through the mouth, and advanced to the                            duodenum for ultrasound examination from the                            stomach and duodenum. The upper EUS was                            accomplished without difficulty. The patient                            tolerated the procedure. Scope In: Scope Out: Findings:      ENDOSCOPIC FINDING: :      No gross lesions were noted in  the entire esophagus.      The Z-line was irregular and was found 39 cm from the incisors.      A 3 cm hiatal hernia was present.      Segmental moderate inflammation characterized by erosions, erythema and       granularity was found in the gastric antrum - previously biopsied and       negative for HP.      No other gross lesions were noted in the entire examined stomach.      No gross lesions were noted in the duodenal bulb, in the first portion       of the duodenum and in the second portion of the duodenum.      The major papilla was prominent with a large intraduodenal portion noted.      ENDOSONOGRAPHIC FINDING: :      An irregular mass-like region was identified in the pancreatic body/tail       region. The mass was hypoechoic. The mass measured 37 mm by 29 mm in       maximal cross-sectional diameter. The endosonographic borders were       poorly-defined. There was  sonographic evidence suggesting abutment of       the mass to the splenic artery without clear invasion/abutment of the       superior mesenteric artery. The remainder of the pancreas was examined.       The endosonographic appearance of parenchyma showed some atrophy.       Interestingly, the downstream pancreatic duct had some ductal dilation       and prominence (PDH - 3.6 mm, PDN - 4.5 mm, PDB - 3.5 mm). Fine needle       biopsy was performed of the mass. Color Doppler imaging was utilized       prior to needle puncture to confirm a lack of significant vascular       structures within the needle path. Five passes were made with the       Acquire S 22 gauge ultrasound core biopsy needle using a transgastric       approach. Visible cores of tissue were obtained. Preliminary cytologic       examination and touch preps were performed. Final cytology results are       pending.      Two stones were visualized endosonographically in the common bile duct.       The stones were rounded. They were hyperechoic and characterized by       shadowing.      There was dilation in the common bile duct (6.4 mm) and in the common       hepatic duct (8.0 mm).      The ampulla was not able to be visualized in its entirety due to the       shadowing effect of the distal biliary stones, however, overt       mass/lesion was not appreciated while following the PD to the near       ampulla and it tapered.      Endosonographic imaging in the visualized portion of the liver showed no       mass.      No malignant-appearing lymph nodes were visualized in the celiac region       (level 20), peripancreatic region and porta hepatis region.      The celiac region was visualized. Impression:  EGD Impression:                           - No gross lesions in esophagus. Z-line irregular,                            39 cm from the incisors.                           - 3 cm hiatal hernia.                            - Gastritis in antrum. No other gross lesions in                            the stomach. Previously biopsied and negative for                            HP.                           - No gross lesions in the duodenal bulb, in the                            first portion of the duodenum and in the second                            portion of the duodenum.                           - Prominent ampulla noted.                           EUS Impression:                           - A mass-like area was identified in the pancreatic                            body/tail region. Cytology results are pending.                            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 This was staged T2                            N0 Mx by endosonographic criteria. The staging                            applies if malignancy is confirmed. Fine needle                            biopsy performed. Though I think, updated  Pancreas-protocol CT or MRI-Abdomen may help ensure                            that vasculature notation is appropriate as noted                            above.                           - Two stones were visualized endosonographically in                            the common bile duct.                           - There was dilation in the common bile duct and in                            the common hepatic duct.                           - No malignant-appearing lymph nodes were                            visualized in the celiac region (level 20),                            peripancreatic region and porta hepatis region. Recommendation:           - The patient will be observed post-procedure,                            until all discharge criteria are met.                           - Discharge patient to home.                           - Patient has a contact number available for                             emergencies. The signs and symptoms of potential                            delayed complications were discussed with the                            patient. Return to normal activities tomorrow.                            Written discharge instructions were provided to the                            patient.                           -  Low fat diet.                           - Observe patient's clinical course.                           - Await cytology results.                           - Pending final pathology will need to consider                            updated cross-sectional imaging for                            staging/vascular evaluation and follow up with                            Oncology as well.                           - Follow up CMP today in regards to underlying LFT                            pattern (not clearly jaundiced on today's physical                            examination).                           - Patient will need ERCP for attempt at CBD                            clearance in the coming weeks. Will discuss with                            Dr. Carlean Purl to see who will have earlier timing for                            this procedure he or I.                           - Monitor for signs/symptoms of pancreatitis,                            bleeding, perforation, and infection. If issues                            please call our number to get further assistance as                            needed.                           - The findings and recommendations were discussed  with the patient.                           - The findings and recommendations were discussed                            with the patient's family. Procedure Code(s):        --- Professional ---                           [phone], Esophagogastroduodenoscopy, flexible,                            transoral; with transendoscopic ultrasound-guided                             intramural or transmural fine needle                            aspiration/biopsy(s), (includes endoscopic                            ultrasound examination limited to the esophagus,                            stomach or duodenum, and adjacent structures) Diagnosis Code(s):        --- Professional ---                           K22.8, Other specified diseases of esophagus                           K44.9, Diaphragmatic hernia without obstruction or                            gangrene                           K29.70, Gastritis, unspecified, without bleeding                           K31.89, Other diseases of stomach and duodenum                           K86.89, Other specified diseases of pancreas                           K80.50, Calculus of bile duct without cholangitis                            or cholecystitis without obstruction                           I89.9, Noninfective disorder of lymphatic vessels                            and lymph nodes, unspecified  R10.13, Epigastric pain                           K83.8, Other specified diseases of biliary tract                           R93.3, Abnormal findings on diagnostic imaging of                            other parts of digestive tract CPT copyright 2019 [employer]. All rights reserved. The codes documented in this report are preliminary and upon coder review may  be revised to meet current compliance requirements. Justice Britain, MD 07/05/2022 9:01:28 AM Number of Addenda: 0

## 2022-07-05 NOTE — Anesthesia Procedure Notes (Signed)
Procedure Name: MAC Date/Time: 07/05/2022 7:47 AM  Performed by: Janene Harvey, CRNAPre-anesthesia Checklist: Patient identified, Emergency Drugs available, Suction available and Patient being monitored Patient Re-evaluated:Patient Re-evaluated prior to induction Oxygen Delivery Method: Nasal cannula Induction Type: IV induction Placement Confirmation: positive ETCO2 Dental Injury: Teeth and Oropharynx as per pre-operative assessment

## 2022-07-06 ENCOUNTER — Telehealth: Payer: Self-pay | Admitting: Internal Medicine

## 2022-07-06 ENCOUNTER — Encounter: Payer: Self-pay | Admitting: Internal Medicine

## 2022-07-06 ENCOUNTER — Telehealth: Payer: Self-pay | Admitting: Gastroenterology

## 2022-07-06 ENCOUNTER — Encounter: Payer: Self-pay | Admitting: Family Medicine

## 2022-07-06 NOTE — Telephone Encounter (Signed)
See 9/7 phone note

## 2022-07-06 NOTE — Telephone Encounter (Signed)
Inbound call from patient calling to speak with a provider. Please see previous phone note.

## 2022-07-06 NOTE — Telephone Encounter (Signed)
Patient with pancreatic mass status post EUS with FNA yesterday.  He also has choledocholithiasis.  Has had a several month history of abdominal pain and work-up finally revealed a pancreatic mass suspected to be malignancy.  He called yesterday with some "arthritis pain" and specifically said he was not having abdominal pain.  He took 800 mg of ibuprofen and was fine until a few hours ago and has had recurrent umbilical abdominal pain.  He says it is a 7 out of a 10 and he does not think he will be able to sleep with this.  He says it is the same pain he has been having off and on for months but more intense.  He believes it is from the common bile duct stones and is asking about setting up an ERCP to remove these soon.  He is not having nausea or vomiting or fever.  I recommended ED evaluation but he wants to take 1 more ibuprofen to see if he gets relief again.  I have recommended a clear liquid diet and he can try another 800 mg ibuprofen but if this fails to work, he worsens etc. he needs to go to the ED.

## 2022-07-06 NOTE — Telephone Encounter (Signed)
Dr Carlean Purl please advise if you would like me to go ahead and set up the ERCP or are you going to proceed.

## 2022-07-07 ENCOUNTER — Encounter (HOSPITAL_COMMUNITY): Payer: Self-pay | Admitting: Gastroenterology

## 2022-07-07 NOTE — Telephone Encounter (Signed)
Agree with this plan of action. Cytology still pending. GM

## 2022-07-09 ENCOUNTER — Other Ambulatory Visit: Payer: Self-pay | Admitting: Internal Medicine

## 2022-07-09 ENCOUNTER — Other Ambulatory Visit (INDEPENDENT_AMBULATORY_CARE_PROVIDER_SITE_OTHER): Payer: Medicare HMO

## 2022-07-09 DIAGNOSIS — R1013 Epigastric pain: Secondary | ICD-10-CM

## 2022-07-09 LAB — CBC
HCT: 41.6 % (ref 39.0–52.0)
Hemoglobin: 13.7 g/dL (ref 13.0–17.0)
MCHC: 32.8 g/dL (ref 30.0–36.0)
MCV: 86.5 fl (ref 78.0–100.0)
Platelets: 176 10*3/uL (ref 150.0–400.0)
RBC: 4.81 Mil/uL (ref 4.22–5.81)
RDW: 14 % (ref 11.5–15.5)
WBC: 4.4 10*3/uL (ref 4.0–10.5)

## 2022-07-09 LAB — COMPREHENSIVE METABOLIC PANEL
ALT: 12 U/L (ref 0–53)
AST: 15 U/L (ref 0–37)
Albumin: 4.1 g/dL (ref 3.5–5.2)
Alkaline Phosphatase: 55 U/L (ref 39–117)
BUN: 14 mg/dL (ref 6–23)
CO2: 31 mEq/L (ref 19–32)
Calcium: 9.9 mg/dL (ref 8.4–10.5)
Chloride: 105 mEq/L (ref 96–112)
Creatinine, Ser: 0.8 mg/dL (ref 0.40–1.50)
GFR: 85.65 mL/min (ref >60.00)
Glucose, Bld: 87 mg/dL (ref 70–99)
Potassium: 4.1 mEq/L (ref 3.5–5.1)
Sodium: 142 mEq/L (ref 135–145)
Total Bilirubin: 0.5 mg/dL (ref 0.2–1.2)
Total Protein: 6.8 g/dL (ref 6.0–8.3)

## 2022-07-09 LAB — AMYLASE: Amylase: 60 U/L (ref 27–131)

## 2022-07-09 LAB — CYTOLOGY - NON PAP

## 2022-07-09 LAB — LIPASE: Lipase: 42 U/L (ref 11.0–59.0)

## 2022-07-10 ENCOUNTER — Other Ambulatory Visit: Payer: Self-pay

## 2022-07-10 ENCOUNTER — Encounter: Payer: Self-pay | Admitting: Gastroenterology

## 2022-07-10 DIAGNOSIS — C259 Malignant neoplasm of pancreas, unspecified: Secondary | ICD-10-CM

## 2022-07-10 DIAGNOSIS — K8689 Other specified diseases of pancreas: Secondary | ICD-10-CM

## 2022-07-11 ENCOUNTER — Encounter: Payer: Self-pay | Admitting: *Deleted

## 2022-07-11 NOTE — Progress Notes (Signed)
PATIENT NAVIGATOR PROGRESS NOTE  Name: Jared [name] Date: 07/11/2022 MRN: [id number]  DOB: [birth year]   Reason for visit:  Introductory phone call  Comments:  Called and spoke with pt regarding New pt appt on Tuesday, September 19 at 1:40 with Dr [name]. Directions to building and parking reviewed Pt verbalized understanding    Time spent counseling/coordinating care: 30-45 minutes

## 2022-07-15 ENCOUNTER — Ambulatory Visit (HOSPITAL_BASED_OUTPATIENT_CLINIC_OR_DEPARTMENT_OTHER)
Admission: RE | Admit: 2022-07-15 | Discharge: 2022-07-15 | Disposition: A | Discharge status: Home / Self Care | Payer: Medicare HMO | Source: Ambulatory Visit | Attending: Gastroenterology | Admitting: Gastroenterology

## 2022-07-15 DIAGNOSIS — K8689 Other specified diseases of pancreas: Secondary | ICD-10-CM | POA: Insufficient documentation

## 2022-07-15 DIAGNOSIS — C259 Malignant neoplasm of pancreas, unspecified: Secondary | ICD-10-CM | POA: Diagnosis present

## 2022-07-15 MED ORDER — IOHEXOL 300 MG/ML  SOLN
100.0000 mL | Freq: Once | INTRAMUSCULAR | Status: AC | PRN
  Administered 2022-07-15: 100 mL via INTRAVENOUS

## 2022-07-17 ENCOUNTER — Inpatient Hospital Stay: Payer: Medicare HMO | Attending: Oncology | Admitting: Oncology

## 2022-07-17 ENCOUNTER — Encounter: Payer: Self-pay | Admitting: *Deleted

## 2022-07-17 DIAGNOSIS — Z9079 Acquired absence of other genital organ(s): Secondary | ICD-10-CM | POA: Insufficient documentation

## 2022-07-17 DIAGNOSIS — C258 Malignant neoplasm of overlapping sites of pancreas: Secondary | ICD-10-CM

## 2022-07-17 DIAGNOSIS — Z800 Family history of malignant neoplasm of digestive organs: Secondary | ICD-10-CM | POA: Insufficient documentation

## 2022-07-17 DIAGNOSIS — Z8546 Personal history of malignant neoplasm of prostate: Secondary | ICD-10-CM | POA: Insufficient documentation

## 2022-07-17 DIAGNOSIS — C251 Malignant neoplasm of body of pancreas: Secondary | ICD-10-CM | POA: Insufficient documentation

## 2022-07-17 DIAGNOSIS — K8050 Calculus of bile duct without cholangitis or cholecystitis without obstruction: Secondary | ICD-10-CM | POA: Insufficient documentation

## 2022-07-17 DIAGNOSIS — Z8042 Family history of malignant neoplasm of prostate: Secondary | ICD-10-CM | POA: Insufficient documentation

## 2022-07-17 DIAGNOSIS — N200 Calculus of kidney: Secondary | ICD-10-CM | POA: Insufficient documentation

## 2022-07-17 DIAGNOSIS — R918 Other nonspecific abnormal finding of lung field: Secondary | ICD-10-CM | POA: Insufficient documentation

## 2022-07-17 NOTE — Progress Notes (Signed)
[facility] New Patient Consult   Requesting MD: [address] [name],  [name] [id number]   Jared [name] 76 y.o.  19-Mar-1946    Reason for Consult: Pancreas cancer   HPI: Jared [name] reports a history of periumbilical discomfort beginning in late 2022 when he was having dental surgery.  The pain persisted after completing several dental implants and he saw Dr. Carlean Purl.  He underwent a colonoscopy 03/27/2022.  Polyps were removed from the descending and sigmoid colon.  The pathology revealed tubular adenomas.  He was prescribed over-the-counter Pepcid for the abdominal pain.  When the pain did not improved he was taken to an upper endoscopy on 05/28/2022.  There were changes of possible gastritis.  Biopsy revealed no significant pathology and no H. pylori.  He was referred for a CT of the abdomen and pelvis on 06/14/2022.  A hypoenhancing lesion was noted in the pancreas body and tail measuring 3.9 x 3.3 cm with mild dilation of the pancreatic duct within the tail.  Multiple nonobstructive bilateral renal calculi.  The splenic vein was potentially involved by the lesion.  No enlarged abdominal or pelvic nodes. He was referred to Dr. Rush Landmark and was taken to an upper EUS on 07/05/2022.                  An irregular mass was identified in the pancreas body/tail measuring 37 x 29 mm.  The mass abuts the splenic artery without invasion of the SMA.  A fine-needle aspiration biopsy was performed.  2 stones were noted in the common bile duct.  There was dilation in the common bile duct and common hepatic duct.  No malignant appearing lymph nodes.  The tumor was staged as a T2N0 lesion by EUS criteria.  The cytology from the pancreas mass biopsy returned with malignant cells consistent with adenocarcinoma.  He was referred for CTs of the chest, abdomen, and pelvis on 07/15/2022.  The pancreas body/tail mass is stable and there is no evidence of metastatic disease.   The tumor is associated with the splenic vein.  No arterial involvement.  Tiny pulmonary nodules stable and favored to be benign.    Past Medical History:  Diagnosis Date   Arthritis    SHOULDER & KNEES   Cataract    bilateral sx   Chronic kidney disease    Inguinal hernia    right   Prostate cancer ([facility]) 10/29/1988   .  Chronic elevation of the PSA-followed by Dr. Jeffie Pollock, maintained on [place]   .  Typhlitis 2014   .  Kidney stones  Past Surgical History:  Procedure Laterality Date   CATARACT EXTRACTION Bilateral    CHOLECYSTECTOMY N/A 07/14/2013   Procedure: LAPAROSCOPIC CHOLECYSTECTOMY;  Surgeon: Stark Klein, MD;  Location: [facility];  Service: General;  Laterality: N/A;   COLONOSCOPY  01/07/2004   Dr. Silvano Rusk   COLONOSCOPY  02/2022   CG-MAC-mira(good)-tics-2 polyps   ESOPHAGOGASTRODUODENOSCOPY (EGD) WITH PROPOFOL N/A 07/05/2022   Procedure: ESOPHAGOGASTRODUODENOSCOPY (EGD) WITH PROPOFOL;  Surgeon: Irving Copas., MD;  Location: [facility];  Service: Gastroenterology;  Laterality: N/A;   EUS N/A 07/05/2022   Procedure: UPPER ENDOSCOPIC ULTRASOUND (EUS) RADIAL;  Surgeon: Irving Copas., MD;  Location: [facility];  Service: Gastroenterology;  Laterality: N/A;   EYE SURGERY     FINE NEEDLE ASPIRATION  07/05/2022   Procedure: FINE NEEDLE ASPIRATION (FNA) LINEAR;  Surgeon: Irving Copas., MD;  Location: [facility];  Service: Gastroenterology;;   INGUINAL HERNIA REPAIR Right    PROSTATECTOMY     TONSILLECTOMY      Medications: Reviewed  Allergies:  Allergies  Allergen Reactions   Claritin [Loratadine] Other (See Comments)    Increases pressure in eyes   Milk-Related Compounds Other (See Comments)    INDIGESTION   Lupron [Leuprolide] Itching    Family history: Jared father and 4 brothers had prostate cancer.  Jared mother died of pancreas cancer.  A sister has metastatic carcinoma of unknown primary involving the liver.  Social History:    He lives with Jared [name] in [place].  He is retired from a Careers information officer job.  He quit smoking cigarettes in 1983.  No alcohol use.  He has received COVID-19 vaccines.  ROS:   Positives include: Periumbilical abdominal pain relieved with ibuprofen, takes 1-2 times daily  A complete ROS was otherwise negative.  Physical Exam:  Blood pressure (!) 144/92, pulse 60, temperature 98.1 F (36.7 C), temperature source Oral, resp. rate 18, height _0  (1.854 m), weight 236 lb (107 kg), SpO2 100 %.  HEENT: Oropharynx without visible mass, neck without mass Lungs: Clear bilaterally Cardiac: Regular rate and rhythm Abdomen: Nontender, no mass, no apparent ascites, no hepatosplenomegaly GU: Testes without mass Vascular: No leg edema Lymph nodes: No cervical, supraclavicular, axillary, or inguinal nodes Neurologic: Alert and oriented, the motor Sam appears intact in the upper and lower extremities bilaterally Skin: Multiple benign-appearing moles over the trunk, inflamed pustular lesion at the right upper back Musculoskeletal: No spine tenderness   LAB:  CBC  Lab Results  Component Value Date   WBC 4.4 07/09/2022   HGB 13.7 07/09/2022   HCT 41.6 07/09/2022   MCV 86.5 07/09/2022   PLT 176.0 07/09/2022   NEUTROABS 2.2 03/20/2022        CMP  Lab Results  Component Value Date   NA 142 07/09/2022   K 4.1 07/09/2022   CL 105 07/09/2022   CO2 31 07/09/2022   GLUCOSE 87 07/09/2022   BUN 14 07/09/2022   CREATININE 0.80 07/09/2022   CALCIUM 9.9 07/09/2022   PROT 6.8 07/09/2022   ALBUMIN 4.1 07/09/2022   AST 15 07/09/2022   ALT 12 07/09/2022   ALKPHOS 55 07/09/2022   BILITOT 0.5 07/09/2022   GFRNONAA >60 07/05/2022   GFRAA >90 08/10/2013     Lab Results  Component Value Date   ZOX096 767 (H) 06/18/2022    Imaging: As per HPI, CT images from 07/15/2022 reviewed with Jared [name] and Jared [name]    Assessment/Plan:   Pancreas cancer FNA biopsy of a pancreas body/tail  mass 07/05/2022-adenocarcinoma CT abdomen/pelvis 06/14/2022-hypoenhancing pancreas body/tail mass with effacement of the splenic vein, no evidence of lymphadenopathy or metastatic disease CA 19-9 on 06/18/2022-767 EUS 07/05/2022-a 7 x 29 mm pancreas body/tail mass, abutment of the splenic artery, no malignant appearing lymph nodes, T2N0 by EUS CTs 07/15/2022-pancreas body/tail mass with no evidence of metastatic disease, splenic vein associated with the tumor, no arterial involvement, stable right greater than left lung nodules favored benign Prostate cancer, status post prostatectomy 1990 Chronic elevation of the PSA-maintained on [place], followed by Dr. Jeffie Pollock  3.  Kidney stones 4.   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  Abdominal pain, likely secondary to #1 6.  Family history of prostate and pancreas cancer 7.  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  Typhlitis in 2014 9.  Colon polyps-tubular adenomas on  colonoscopy 03/27/2022   Disposition:   Jared [name] has been diagnosed with pancreas cancer.  He appears to have localized and resectable disease based on the staging evaluation today.  However the markedly elevated CA 19-9 and abdominal pain are concerning for locally advanced or microscopic metastatic disease.  I discussed the prognosis and treatment of pancreas cancer with Jared [name] and Jared [name].  He understands surgery is the only potentially curative therapy.  He appears to be a surgical candidate.  He will be referred to Dr. Barry Dienes.  Jared case will be presented at the GI tumor conference on 07/25/2022.  My initial impression is to recommend neoadjuvant chemotherapy to be followed by surgery.  Jared [name] will be referred to the genetics counselor.  He will continue ibuprofen as needed for pain.  He will contact us if the ibuprofen does not relieve Jared pain.  Jared [name] will return for an office visit and further discussion on 07/25/2022.  I  will recommend neoadjuvant FOLFIRINOX.  Betsy Coder, MD  07/17/2022, 5:30 PM

## 2022-07-17 NOTE — Progress Notes (Signed)
PATIENT NAVIGATOR PROGRESS NOTE  Name: Jared [name] Date: 07/17/2022 MRN: [id number]  DOB: 1946-04-13   Reason for visit:  New patient appt  Comments:  Met with Mr and [name] during appt with Dr [name] Referral placed to surgeon for consult Referral for social work Referral for nutrition Referral for Genetics Added for GI conference for 9/27 [facility] education given Contact information reviewed with patient and his wife Will RTC next Wednesday for pre tx discussion     Time spent counseling/coordinating care: > 60 minutes

## 2022-07-18 ENCOUNTER — Telehealth: Payer: Self-pay | Admitting: Genetic Counselor

## 2022-07-18 ENCOUNTER — Other Ambulatory Visit (HOSPITAL_BASED_OUTPATIENT_CLINIC_OR_DEPARTMENT_OTHER): Payer: Self-pay

## 2022-07-18 ENCOUNTER — Telehealth: Payer: Self-pay

## 2022-07-18 MED ORDER — INFLUENZA VAC A&B SA ADJ QUAD 0.5 ML IM PRSY
PREFILLED_SYRINGE | INTRAMUSCULAR | 0 refills | Status: DC
  Filled 2022-07-18: qty 0.5, 1d supply, fill #0

## 2022-07-18 NOTE — Telephone Encounter (Signed)
Scheduled appt per 9/19 referral. Pt is aware of appt date and time. Pt is aware to arrive 15 mins prior to appt time and to bring and updated insurance card. Pt is aware of appt location.   

## 2022-07-18 NOTE — Telephone Encounter (Signed)
CSW attempted to contact patient and left a vm.  CSW received referral from Nurse Navigator due to new diagnosis.

## 2022-07-19 ENCOUNTER — Encounter: Payer: Self-pay | Admitting: Family Medicine

## 2022-07-23 ENCOUNTER — Other Ambulatory Visit: Payer: Self-pay | Admitting: General Surgery

## 2022-07-23 DIAGNOSIS — Z8546 Personal history of malignant neoplasm of prostate: Secondary | ICD-10-CM | POA: Insufficient documentation

## 2022-07-23 DIAGNOSIS — R978 Other abnormal tumor markers: Secondary | ICD-10-CM | POA: Insufficient documentation

## 2022-07-23 DIAGNOSIS — K8050 Calculus of bile duct without cholangitis or cholecystitis without obstruction: Secondary | ICD-10-CM

## 2022-07-23 HISTORY — DX: Calculus of bile duct without cholangitis or cholecystitis without obstruction: K80.50

## 2022-07-24 ENCOUNTER — Ambulatory Visit (INDEPENDENT_AMBULATORY_CARE_PROVIDER_SITE_OTHER): Payer: Medicare HMO

## 2022-07-24 DIAGNOSIS — Z23 Encounter for immunization: Secondary | ICD-10-CM | POA: Diagnosis not present

## 2022-07-25 ENCOUNTER — Inpatient Hospital Stay (HOSPITAL_BASED_OUTPATIENT_CLINIC_OR_DEPARTMENT_OTHER): Payer: Medicare HMO | Admitting: Nurse Practitioner

## 2022-07-25 ENCOUNTER — Encounter: Payer: Medicare HMO | Admitting: Nutrition

## 2022-07-25 ENCOUNTER — Inpatient Hospital Stay: Payer: Medicare HMO | Admitting: Nutrition

## 2022-07-25 ENCOUNTER — Encounter: Payer: Self-pay | Admitting: Nurse Practitioner

## 2022-07-25 ENCOUNTER — Other Ambulatory Visit: Payer: Self-pay

## 2022-07-25 VITALS — BP 141/87 | HR 73 | Temp 98.1°F | Resp 18 | Ht 73.0 in | Wt 235.6 lb

## 2022-07-25 DIAGNOSIS — C251 Malignant neoplasm of body of pancreas: Secondary | ICD-10-CM

## 2022-07-25 DIAGNOSIS — K8050 Calculus of bile duct without cholangitis or cholecystitis without obstruction: Secondary | ICD-10-CM | POA: Diagnosis not present

## 2022-07-25 DIAGNOSIS — Z9079 Acquired absence of other genital organ(s): Secondary | ICD-10-CM | POA: Diagnosis not present

## 2022-07-25 DIAGNOSIS — Z8546 Personal history of malignant neoplasm of prostate: Secondary | ICD-10-CM | POA: Diagnosis not present

## 2022-07-25 DIAGNOSIS — N200 Calculus of kidney: Secondary | ICD-10-CM | POA: Diagnosis not present

## 2022-07-25 DIAGNOSIS — Z8042 Family history of malignant neoplasm of prostate: Secondary | ICD-10-CM | POA: Diagnosis not present

## 2022-07-25 DIAGNOSIS — Z800 Family history of malignant neoplasm of digestive organs: Secondary | ICD-10-CM | POA: Diagnosis not present

## 2022-07-25 DIAGNOSIS — R918 Other nonspecific abnormal finding of lung field: Secondary | ICD-10-CM | POA: Diagnosis not present

## 2022-07-25 MED ORDER — PROCHLORPERAZINE MALEATE 10 MG PO TABS: 10.0000 mg | ORAL_TABLET | Freq: Four times a day (QID) | ORAL | 2 refills | Status: DC | PRN

## 2022-07-25 NOTE — Pre-Procedure Instructions (Signed)
Surgical Instructions    Your procedure is scheduled on Tuesday, October 3rd.  Report to [facility] Main Entrance "A" at 08:30 A.M., then check in with the Admitting office.  Call this number if you have problems the morning of surgery:  [phone]   If you have any questions prior to your surgery date call [phone]: Open Monday-Friday 8am-4pm    Remember:  Do not eat after midnight the night before your surgery  You may drink clear liquids until 07:30 AM the morning of your surgery.   Clear liquids allowed are: Water, Non-Citrus Juices (without pulp), Carbonated Beverages, Clear Tea, Black Coffee Only (NO MILK, CREAM OR POWDERED CREAMER of any kind), and Gatorade.   Patient Instructions  The night before surgery:  No food after midnight. ONLY clear liquids after midnight  The day of surgery (if you do NOT have diabetes):  Drink ONE (1) Pre-Surgery Clear Ensure by 07:30 AM the morning of surgery. Drink in one sitting. Do not sip.  This drink was given to you during your hospital  pre-op appointment visit.  Nothing else to drink after completing the  Pre-Surgery Clear Ensure.         If you have questions, please contact your surgeon's office.     Take these medicines the morning of surgery with A SIP OF WATER  brimonidine (ALPHAGAN) 0.15 % ophthalmic solution Calcium Carbonate Antacid (TUMS ULTRA 1000 PO) Dorzolamide HCl-Timolol Mal PF 2-0.5 % SOLN GEMTESA  If needed: Carboxymethylcell-Glycerin PF (REFRESH OPTIVE PF)  sodium chloride (AYR) 0.65 % nasal spray   As of today, STOP taking any Aspirin (unless otherwise instructed by your surgeon) Aleve, Naproxen, Ibuprofen, Motrin, Advil, Goody's, BC's, all herbal medications, fish oil, and all vitamins.                     Do NOT Smoke (Tobacco/Vaping) for 24 hours prior to your procedure.  If you use a CPAP at night, you may bring your mask/headgear for your overnight stay.   Contacts, glasses, piercing's,  hearing aid's, dentures or partials may not be worn into surgery, please bring cases for these belongings.    For patients admitted to the hospital, discharge time will be determined by your treatment team.   Patients discharged the day of surgery will not be allowed to drive home, and someone needs to stay with them for 24 hours.  SURGICAL WAITING ROOM VISITATION Patients having surgery or a procedure may have no more than 2 support people in the waiting area - these visitors may rotate.   Children under the age of 39 must have an adult with them who is not the patient. If the patient needs to stay at the hospital during part of their recovery, the visitor guidelines for inpatient rooms apply. Pre-op nurse will coordinate an appropriate time for 1 support person to accompany patient in pre-op.  This support person may not rotate.   Please refer to the [facility] website for the visitor guidelines for Inpatients (after your surgery is over and you are in a regular room).    Special instructions:   [place]- Preparing For Surgery  Before surgery, you can play an important role. Because skin is not sterile, your skin needs to be as free of germs as possible. You can reduce the number of germs on your skin by washing with CHG (chlorahexidine gluconate) Soap before surgery.  CHG is an antiseptic cleaner which kills germs and bonds with the skin to  continue killing germs even after washing.    Oral Hygiene is also important to reduce your risk of infection.  Remember - BRUSH YOUR TEETH THE MORNING OF SURGERY WITH YOUR REGULAR TOOTHPASTE  Please do not use if you have an allergy to CHG or antibacterial soaps. If your skin becomes reddened/irritated stop using the CHG.  Do not shave (including legs and underarms) for at least 48 hours prior to first CHG shower. It is OK to shave your face.  Please follow these instructions carefully.   Shower the NIGHT BEFORE SURGERY and the MORNING OF  SURGERY  If you chose to wash your hair, wash your hair first as usual with your normal shampoo.  After you shampoo, rinse your hair and body thoroughly to remove the shampoo.  Use CHG Soap as you would any other liquid soap. You can apply CHG directly to the skin and wash gently with a scrungie or a clean washcloth.   Apply the CHG Soap to your body ONLY FROM THE NECK DOWN.  Do not use on open wounds or open sores. Avoid contact with your eyes, ears, mouth and genitals (private parts). Wash Face and genitals (private parts)  with your normal soap.   Wash thoroughly, paying special attention to the area where your surgery will be performed.  Thoroughly rinse your body with warm water from the neck down.  DO NOT shower/wash with your normal soap after using and rinsing off the CHG Soap.  Pat yourself dry with a CLEAN TOWEL.  Wear CLEAN PAJAMAS to bed the night before surgery  Place CLEAN SHEETS on your bed the night before your surgery  DO NOT SLEEP WITH PETS.   Day of Surgery: Take a shower with CHG soap. Do not wear jewelry or makeup Do not wear lotions, powders, colognes, or deodorant. Men may shave face and neck. Do not bring valuables to the hospital. [facility] is not responsible for any belongings or valuables.  Wear Clean/Comfortable clothing the morning of surgery Remember to brush your teeth WITH YOUR REGULAR TOOTHPASTE.   Please read over the following fact sheets that you were given.    If you received a COVID test during your pre-op visit  it is requested that you wear a mask when out in public, stay away from anyone that may not be feeling well and notify your surgeon if you develop symptoms. If you have been in contact with anyone that has tested positive in the last 10 days please notify you surgeon.

## 2022-07-25 NOTE — Progress Notes (Signed)
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 [name]. Plan for FOLFIRINOX and then surgery.  PMH includes Arthritis, CKD, Prostate cancer, Kidney Stones, Typhlitis, Tobacco.  Medications include Vit C, Tums, Vit D3, MVI, and Garlic.  Labs reviewed.  Height: 6'1". Weight: 235 pounds 9.6 oz UBW:235 pounds. BMI: 31.08  Patient reports he has occasional stomach pain. Denies nutrition side effects. No weight loss. He follows a low fat diet at home in general. He eats oatmeal or eggs with [place] sausage at Breakfast. Sometimes he drinks a fruit smoothie. He doesn't really eat a mid day meal but snacks on something like cheese and crackers. For dinner he eats a protein, starch and vegetable. Reports an intolerance to milk.  Nutrition Diagnosis: Food and Nutrition Related Knowledge Deficit related to pancreas cancer and associated treatments as evidenced by no prior need for nutrition related information  Intervention: Encouraged small, frequent meals and snacks with adequate calorie and protein to minimize wt loss. Provided nutrition fact sheets. Provided education on eating after receiving Oxaliplatin and provided a nutrition fact sheet. Questions answered. Contact information given.  Monitoring, Evaluation, Goals: Patient will tolerate adequate calories and protein to minimize wt loss.  Next Visit: To be scheduled as needed.

## 2022-07-25 NOTE — Progress Notes (Signed)
START ON PATHWAY REGIMEN - Pancreatic Adenocarcinoma     A cycle is every 14 days:     Irinotecan      Oxaliplatin      Leucovorin      Fluorouracil   **Always confirm dose/schedule in your pharmacy ordering system**  Patient Characteristics: Preoperative, M0 (Clinical Staging), Resectable, Neoadjuvant Therapy Planned, BRCA1/2 and PALB2 Mutation Absent/Unknown Therapeutic Status: Preoperative, M0 (Clinical Staging) AJCC T Category: cT2 AJCC N Category: cN0 Resectability Status: Resectable AJCC M Category: cM0 AJCC 8 Stage Grouping: IB BRCA1/2 Mutation Status: Did Not Order Test PALB2 Mutation Status: Did Not Order Test Intent of Therapy: Curative Intent, Discussed with Patient 

## 2022-07-25 NOTE — Progress Notes (Signed)
[place] OFFICE PROGRESS NOTE   Diagnosis: Pancreas cancer  INTERVAL HISTORY:   Jared [name] returns as scheduled.  He continues to have mild abdominal pain.  He has a good appetite.  No nausea or vomiting.  Bowels moving regularly.  No baseline neuropathy symptoms.  Objective:  Vital signs in last 24 hours:  Blood pressure (!) 141/87, pulse 73, temperature 98.1 F (36.7 C), temperature source Oral, resp. rate 18, height '6\' 1"'$  (1.854 m), weight 235 lb 9.6 oz (106.9 kg), SpO2 98 %.    Resp: Lungs clear bilaterally. Cardio: Regular rate and rhythm. GI: Soft and nontender.  No hepatosplenomegaly. Vascular: No leg edema.   Lab Results:  Lab Results  Component Value Date   WBC 4.4 07/09/2022   HGB 13.7 07/09/2022   HCT 41.6 07/09/2022   MCV 86.5 07/09/2022   PLT 176.0 07/09/2022   NEUTROABS 2.2 03/20/2022    Imaging:  No results found.  Medications: I have reviewed the patient's current medications.  Assessment/Plan: Pancreas cancer FNA biopsy of a pancreas body/tail mass 07/05/2022-adenocarcinoma CT abdomen/pelvis 06/14/2022-hypoenhancing pancreas body/tail mass with effacement of the splenic vein, no evidence of lymphadenopathy or metastatic disease CA 19-9 on 06/18/2022-767 EUS 07/05/2022-a 7 x 29 mm pancreas body/tail mass, abutment of the splenic artery, no malignant appearing lymph nodes, T2N0 by EUS CTs 07/15/2022-pancreas body/tail mass with no evidence of metastatic disease, splenic vein associated with the tumor, no arterial involvement, stable right greater than left lung nodules favored benign Prostate cancer, status post prostatectomy 1990 Chronic elevation of the PSA-maintained on Trelstar, followed by Dr. Jeffie Pollock   3.  Kidney stones 4.   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  Abdominal pain, likely secondary to #1 6.  Family history of prostate and pancreas cancer 7.  Bilateral cataract surgery, left eye macular "pucker "following cataract  surgery 8.  Typhlitis in 2014 9.  Colon polyps-tubular adenomas on colonoscopy 03/27/2022    Disposition: Jared [name] appears stable.  He has seen Dr. Barry Dienes.  Recommendation is for neoadjuvant chemotherapy.  He is scheduled for Port-A-Cath placement.  Dr. [name] recommends the FOLFIRINOX regimen, begin with FOLFOX for the first cycle.    We reviewed potential side effects associated with chemotherapy including bone marrow toxicity, nausea, hair loss, allergic reaction.  We discussed potential toxicities associated with 5-fluorouracil including mouth sores, diarrhea, increased sensitivity to sun, rash, skin hyperpigmentation, hand-foot syndrome.  We reviewed various forms of neuropathy associated with oxaliplatin including cold sensitivity, peripheral neuropathy, acute laryngopharyngeal dysesthesia, diplopia, ataxia, incontinence.  We discussed potential side effects associated with irinotecan including early and late phase diarrhea.  He agrees to proceed.  He will attend a chemotherapy education class later this week.    We will obtain baseline labs to include CBC, chemistry panel and CA 19-9.    He is scheduled to have a Port-A-Cath placed next week.    He will return for the first cycle of chemotherapy on 08/01/2022.  We will see him in follow-up prior to cycle 2 on 08/15/2022.  We are available to see him sooner if needed.  Patient seen with Dr. [name].    [name] ANP/GNP-BC   07/25/2022  2:25 PM  This was a shared visit with [name].  Jared [name] has been diagnosed with pancreas cancer.  His case was presented at the GI tumor conference earlier today.  He has been evaluated by Dr. Barry Dienes.  The consensus opinion from the GI tumor board is  to proceed with neoadjuvant chemotherapy.  I recommend FOLFIRINOX.  We reviewed potential toxicities associated with the FOLFIRINOX regimen.  He agrees to proceed.  Irinotecan will be held with cycle 1.  Cycle 1 chemotherapy will be scheduled for  08/01/2022.  He will attend a chemotherapy teach class.  I was present for greater than 50% of today's visit.  I performed medical decision making.  Julieanne Manson, MD

## 2022-07-25 NOTE — Progress Notes (Signed)
The proposed treatment discussed in conference is for discussion purpose only and is not a binding recommendation.  The patients have not been physically examined, or presented with their treatment options.  Therefore, final treatment plans cannot be decided.  

## 2022-07-26 ENCOUNTER — Encounter (HOSPITAL_COMMUNITY): Payer: Self-pay

## 2022-07-26 ENCOUNTER — Encounter (HOSPITAL_COMMUNITY)
Admission: RE | Admit: 2022-07-26 | Discharge: 2022-07-26 | Disposition: A | Discharge status: Home / Self Care | Payer: Medicare HMO | Source: Ambulatory Visit | Attending: General Surgery | Admitting: General Surgery

## 2022-07-26 ENCOUNTER — Inpatient Hospital Stay: Payer: Medicare HMO

## 2022-07-26 ENCOUNTER — Other Ambulatory Visit: Payer: Self-pay

## 2022-07-26 DIAGNOSIS — Z01818 Encounter for other preprocedural examination: Secondary | ICD-10-CM | POA: Diagnosis not present

## 2022-07-26 HISTORY — DX: Personal history of urinary calculi: Z87.442

## 2022-07-26 NOTE — Progress Notes (Signed)
[place] Work  Initial Assessment   Jared [name] is a 76 y.o. year old male contacted by phone. Clinical Social Work was referred by nurse navigator for assessment of psychosocial needs.   SDOH (Social Determinants of Health) assessments performed: Yes SDOH Interventions    Flowsheet Row Clinical Support from 07/26/2022 in [facility]  SDOH Interventions   Food Insecurity Interventions Intervention Not Indicated  Housing Interventions Intervention Not Indicated  Transportation Interventions Intervention Not Indicated  Utilities Interventions Intervention Not Indicated  Financial Strain Interventions Intervention Not Indicated  Social Connections Interventions Intervention Not Indicated       SDOH Screenings   Food Insecurity: No Food Insecurity (07/26/2022)  Housing: Low Risk  (07/26/2022)  Transportation Needs: No Transportation Needs (07/26/2022)  Utilities: Not At Risk (07/26/2022)  Depression (PHQ2-9): Low Risk  (08/30/2020)  Financial Resource Strain: Low Risk  (07/26/2022)  Social Connections: Socially Integrated (07/26/2022)  Tobacco Use: Medium Risk (07/26/2022)     Distress Screen completed: No    07/17/2022    3:20 PM  ONCBCN DISTRESS SCREENING  Screening Type Initial Screening  Distress experienced in past week (1-10) 0  Emotional problem type Adjusting to illness  Spiritual/Religous concerns type Relating to God  Physical Problem type Pain  Physician notified of physical symptoms Yes  Referral to clinical psychology No  Referral to clinical social work No  Referral to dietition No  Referral to financial advocate No  Referral to support programs No  Referral to palliative care No      Family/Social Information:  Housing Arrangement: patient lives with his wife, Jared [name] . Family members/support persons in your life? Family and Friends Transportation concerns: no  Employment: Retired for 27 years.  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 Income source: Paediatric nurse concerns: No Type of concern: None Food access concerns: no Religious or spiritual practice: Yes Services Currently in place:  [employer]  Coping/ Adjustment to diagnosis: Patient understands treatment plan and what happens next? yes Concerns about diagnosis and/or treatment: I'm not especially worried about anything Patient reported stressors:  None Hopes and/or priorities: Family is priority. Patient enjoys time with family/ friends Current coping skills/ strengths: Average or above average intelligence , Capable of independent living , Communication skills , Scientist, research (life sciences) , General fund of knowledge , Motivation for treatment/growth , and Supportive family/friends     SUMMARY: Current SDOH Barriers:  None   Clinical Social Work Clinical Goal(s):  No clinical social work goals at this time  Interventions: Discussed common feeling and emotions when being diagnosed with cancer, and the importance of support during treatment Informed patient of the support team roles and support services at [facility] Provided [place] contact information and encouraged patient to call with any questions or concerns Provided patient with information about the [employer] and [employer]   Follow Up Plan: Patient will contact CSW with any support or resource needs Patient verbalizes understanding of plan: Yes    Rodman Pickle [name], LCSW

## 2022-07-26 NOTE — Progress Notes (Signed)
PCP - Dr. Talbert Cage Cardiologist - denies  PPM/ICD - denies   Chest x-ray - 08/10/13 EKG - 08/10/13 Stress Test - 2009-2010 per pt ECHO - denies Cardiac Cath - denies  Sleep Study - denies   DM- denies  ASA/Blood Thinner Instructions: n/a   ERAS Protcol - yes PRE-SURGERY Ensure given at PAT  COVID TEST- n/a   Anesthesia review: no  Patient denies shortness of breath, fever, cough and chest pain at PAT appointment   All instructions explained to the patient, with a verbal understanding of the material. Patient agrees to go over the instructions while at home for a better understanding. The opportunity to ask questions was provided.

## 2022-07-27 ENCOUNTER — Other Ambulatory Visit: Payer: Self-pay | Admitting: *Deleted

## 2022-07-27 ENCOUNTER — Telehealth: Payer: Self-pay | Admitting: Genetic Counselor

## 2022-07-27 ENCOUNTER — Inpatient Hospital Stay: Payer: Medicare HMO

## 2022-07-27 DIAGNOSIS — C251 Malignant neoplasm of body of pancreas: Secondary | ICD-10-CM

## 2022-07-27 LAB — CMP (CANCER CENTER ONLY)
ALT: 61 U/L — ABNORMAL HIGH (ref 0–44)
AST: 20 U/L (ref 15–41)
Albumin: 4.4 g/dL (ref 3.5–5.0)
Alkaline Phosphatase: 70 U/L (ref 38–126)
Anion gap: 7 (ref 5–15)
BUN: 13 mg/dL (ref 8–23)
CO2: 28 mmol/L (ref 22–32)
Calcium: 9.7 mg/dL (ref 8.9–10.3)
Chloride: 105 mmol/L (ref 98–111)
Creatinine: 0.91 mg/dL (ref 0.61–1.24)
GFR, Estimated: >60 mL/min (ref >60)
Glucose, Bld: 120 mg/dL — ABNORMAL HIGH (ref 70–99)
Potassium: 4.4 mmol/L (ref 3.5–5.1)
Sodium: 140 mmol/L (ref 135–145)
Total Bilirubin: 0.5 mg/dL (ref 0.3–1.2)
Total Protein: 6.9 g/dL (ref 6.5–8.1)

## 2022-07-27 LAB — CBC WITH DIFFERENTIAL (CANCER CENTER ONLY)
Abs Immature Granulocytes: 0.01 10*3/uL (ref 0.00–0.07)
Basophils Absolute: 0.1 10*3/uL (ref 0.0–0.1)
Basophils Relative: 1 %
Eosinophils Absolute: 0.2 10*3/uL (ref 0.0–0.5)
Eosinophils Relative: 4 %
HCT: 43 % (ref 39.0–52.0)
Hemoglobin: 14.4 g/dL (ref 13.0–17.0)
Immature Granulocytes: 0 %
Lymphocytes Relative: 26 %
Lymphs Abs: 1.3 10*3/uL (ref 0.7–4.0)
MCH: 29 pg (ref 26.0–34.0)
MCHC: 33.5 g/dL (ref 30.0–36.0)
MCV: 86.5 fL (ref 80.0–100.0)
Monocytes Absolute: 0.7 10*3/uL (ref 0.1–1.0)
Monocytes Relative: 13 %
Neutro Abs: 2.9 10*3/uL (ref 1.7–7.7)
Neutrophils Relative %: 56 %
Platelet Count: 209 10*3/uL (ref 150–400)
RBC: 4.97 MIL/uL (ref 4.22–5.81)
RDW: 13.6 % (ref 11.5–15.5)
WBC Count: 5.1 10*3/uL (ref 4.0–10.5)
nRBC: 0 % (ref 0.0–0.2)

## 2022-07-27 MED ORDER — LIDOCAINE-PRILOCAINE 2.5-2.5 % EX CREA: 1.0000 | TOPICAL_CREAM | CUTANEOUS | 0 refills | Status: DC | PRN

## 2022-07-27 MED ORDER — ONDANSETRON HCL 8 MG PO TABS: 8.0000 mg | ORAL_TABLET | Freq: Three times a day (TID) | ORAL | 0 refills | Status: DC | PRN

## 2022-07-27 NOTE — Telephone Encounter (Signed)
[facility] CancerNext-Expanded Panel was ordered on 07/27/2022.  Lucille Passy, MS, [facility] Genetic Counselor [email or web address] (P) [phone]

## 2022-07-28 LAB — CANCER ANTIGEN 19-9: CA 19-9: 906 U/mL — ABNORMAL HIGH (ref 0–35)

## 2022-07-29 ENCOUNTER — Other Ambulatory Visit: Payer: Self-pay | Admitting: Oncology

## 2022-07-30 NOTE — H&P (Signed)
Chief Complaint  Patient presents with   New Consultation      pancreatic cancer      Referring MD: Jared [name]   HISTORY:   Patient is a 76 year old male who presented with mid to upper abdominal discomfort at the end of last year.  He was undergoing a series of a bunch of dental implants and he thought was attributable to that as well as the anxiety.  However, he completed this treatment and continued to have nominal pain.  He was almost due for his colonoscopy so he had that and that showed a few polyps, but no lesions.  He subsequently had an upper and endoscopy which was unrevealing.  At that point a CT scan was performed and this showed a distal pancreatic mass.  He then had an endoscopic ultrasound confirming this.  The EUS showed a mass with approximately 3.7 cm in the pancreatic body/tail.  There was abutment of the splenic artery on ultrasound.  There was some atrophy and some downstream pancreatic ductal dilation.  Incidentally, he also had 2 stones in the common bile duct with some dilation of the common bile duct and common hepatic duct.  No lymph nodes are seen.  Endoscopic staging was UT2N0.  The patient has not had any significant weight loss.  The patient has only needed occasional ibuprofen for the abdominal pain, but it is kind of a nagging discomfort that is always present.   Incidentally, I did a laparoscopic cholecystectomy on Jared [name] in 2014 at [facility].  Dr. Dalbert [name] did a right inguinal hernia repair in 2016.   Patient has a personal history of prostate cancer.  His father and 4 brothers also had prostate cancer.  His mother had pancreas cancer and a sister had some sort of metastatic cancer that involved the liver.   Imaging: \   Ct abd/pelvis 06/16/2022 IMPRESSION: 1. Hypoenhancing lesion of the pancreatic body and tail, somewhat ill-defined and difficult to accurately measure although approximately 3.9 x 3.3 cm, with abrupt effacement of the pancreatic duct within  this lesion and mild dilatation within the pancreatic tail. Findings are highly suspicious for pancreatic adenocarcinoma. 2. No evidence of lymphadenopathy or metastatic disease in the abdomen or pelvis. 3. Multiple small nonobstructive bilateral renal calculi, more numerous on the right. No ureteral calculi or hydronephrosis. 4. Status post prostatectomy.   CT chest/abd/pelvis 07/16/2022 IMPRESSION: 1. Since the CT of 06/14/2022, similar size of pancreatic body/tail junction carcinoma without evidence of metastatic disease. 2. Suspect mild peripancreatic edema as can be seen with pancreatitis. 3. 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 No arterial involvement identified. 4. Tiny right greater than left pulmonary nodules, similar over prior exams and favored to be benign. 5.  Aortic Atherosclerosis (ICD10-I70.0). 6. Left adrenal adenoma 7. Bilateral nephrolithiasis   EUS/EGD: 07/05/2022 Jared [name] No gross lesions in esophagus. Z-line irregular, 39 cm from the incisors. - 3 cm hiatal hernia. - Gastritis in antrum. No other gross lesions in the stomach. Previously biopsied and negative for HP. - No gross lesions in the duodenal bulb, in the first portion of the duodenum and in the second portion of the duodenum. - Prominent ampulla noted. EUS Impression: - A mass-like area was identified in the pancreatic body/tail region. Cytology results are pending. However, the endosonographic appearance is suspicious for adenocarcinoma. This was staged T2 N0 Mx by endosonographic criteria. The staging applies if malignancy is confirmed. Fine needle biopsy performed. Though I think, updated Pancreas-protocol CT or MRI-Abdomen may help  ensure that vasculature notation is appropriate as noted above. - Two stones were visualized endosonographically in the common bile duct. - There was dilation in the common bile duct and in the common hepatic duct. - No malignant-appearing lymph  nodes were visualized in the celiac region (level 20), peripancreatic region and porta hepatis region.   ERCP: none at this point.    Pathology 07/05/2022 A. PANCREAS, BODY, FINE NEEDLE ASPIRATION:  - Malignant cells consistent with adenocarcinoma        Past Medical History:  Diagnosis Date   Arthritis     Chronic kidney disease     GERD (gastroesophageal reflux disease)     History of cancer             Past Surgical History:  Procedure Laterality Date   CHOLECYSTECTOMY       COLON SURGERY       HERNIA REPAIR       PROSTATECTOMY PERINEAL       TONSILLECTOMY          Current Medications        Current Outpatient Medications  Medication Sig Dispense Refill   ascorbic acid, vitamin C, (VITAMIN C) 500 MG tablet Take by mouth       brimonidine (ALPHAGAN P) 0.15 % ophthalmic solution Apply to eye       carboxymethylcellulose (REFRESH TEARS) 0.5 % ophthalmic solution Place 1-2 drops into both eyes as needed for Dry Eyes       dorzolamide-timolol (COSOPT PF) 2-0.5 % ophthalmic solution Apply 1 drop to eye 2 (two) times daily       garlic 9,528 mg Cap Take by mouth       GEMTESA 75 mg Tab Take by mouth       sodium chloride (AYR) 0.65 % nasal drops Place into one nostril       triptorelin pamoate (TRELSTAR) 11.25 mg IM suspension Inject into the muscle        No current facility-administered medications for this visit.               Allergies  Allergen Reactions   Loratadine Other (See Comments)      Increases pressure in eyes   Milk Containing Products (Dairy) Other (See Comments)      INDIGESTION   Leuprolide Itching             Family History  Problem Relation Age of Onset   Diabetes Sister          Social History           Socioeconomic History   Marital status: Married  Tobacco Use   Smoking status: Former      Packs/day: 0.50      Types: Cigarettes      Quit date: 10/29/1981      Years since quitting: 40.7   Smokeless tobacco: Never  Substance  and Sexual Activity   Alcohol use: Not Currently   Drug use: Never          REVIEW OF SYSTEMS - PERTINENT POSITIVES ONLY: A complete review of systems negative other than HPI and PMH   EXAM:    Vitals:    07/23/22 1355  BP: (!) 130/95  Pulse: 64  Temp: 36.3 C (97.4 F)         Wt Readings from Last 3 Encounters:  07/23/22 (!) 106.1 kg (234 lb)        Gen:  No acute distress.  Well nourished  and well groomed.   Neurological: Alert and oriented to person, place, and time. Coordination normal.  Head: Normocephalic and atraumatic.  Eyes: Conjunctivae are normal. Pupils are equal, round, and reactive to light. No scleral icterus.  Neck: Normal range of motion. Neck supple. No tracheal deviation or thyromegaly present.  Cardiovascular: Normal rate, regular rhythm, normal heart sounds and intact distal pulses.  Exam reveals no gallop and no friction rub.  No murmur heard. Respiratory: Effort normal.  No respiratory distress. No chest wall tenderness. Breath sounds normal.  No wheezes, rales or rhonchi.  GI: Soft. Bowel sounds are normal. The abdomen is soft and nontender.  There is no rebound and no guarding.  Musculoskeletal: Normal range of motion. Extremities are nontender.  Lymphadenopathy: No cervical, preauricular, postauricular or axillary adenopathy is present Skin: Skin is warm and dry. No rash noted. No diaphoresis. No erythema. No pallor. No clubbing, cyanosis, or edema.   Psychiatric: Normal mood and affect. Behavior is normal. Judgment and thought content normal.      LABORATORY RESULTS: Available labs are reviewed    CA 19-9 767 06/18/2022   RADIOLOGY RESULTS: See above.     ASSESSMENT AND PLAN: 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CMS-HCC)  (primary encounter diagnosis)   Family history of cancer   H/O prostate cancer   Choledocholithiasis   Elevated CA 19-9 level     I agree with Dr. Benay [name] that I would recommend neoadjuvant chemotherapy.   I will schedule him for a port.  Described the port and reviewed the risks.  I discussed risks of bleeding, infection, malfunction, possible need for additional procedures, possible pneumothorax.   He has been referred to genetics which is appropriate given his family history of prostate cancer and pancreatic cancer.   He has nonobstructing gallstones, but these certainly are risk for causing him jaundice or cholangitis during his therapy.  Additionally, these can make his CA 19-9 value be inaccurate.   He has an ERCP scheduled in October to get these stones out.   I also reviewed distal pancreatectomy and splenectomy with the patient.  I reviewed that he will need vaccinations preoperatively.  I showed diagrams of anatomy and discussed risks of surgery including bleeding, infection, damage to adjacent structures, pancreatic leak, heart or lung issues, blood clot.  I also discussed that I would do a diagnostic laparoscopy and that if he had metastatic disease, we would abort the surgery.   Patient has an appointment Wednesday afternoon to finalize things with Dr. Benay [name] regarding initiation of chemotherapy.  I have placed orders and sent a posting sheet to get the Port-A-Cath set up.   Jared Height, MD FACS Surgical Oncology, General Surgery, Trauma and [facility], [facility]

## 2022-07-31 ENCOUNTER — Other Ambulatory Visit: Payer: Self-pay

## 2022-07-31 ENCOUNTER — Ambulatory Visit (HOSPITAL_COMMUNITY): Payer: Medicare HMO

## 2022-07-31 ENCOUNTER — Encounter (HOSPITAL_COMMUNITY): Payer: Self-pay | Admitting: General Surgery

## 2022-07-31 ENCOUNTER — Ambulatory Visit (HOSPITAL_BASED_OUTPATIENT_CLINIC_OR_DEPARTMENT_OTHER): Payer: Medicare HMO | Admitting: Anesthesiology

## 2022-07-31 ENCOUNTER — Ambulatory Visit (HOSPITAL_COMMUNITY)
Admission: RE | Admit: 2022-07-31 | Discharge: 2022-07-31 | Disposition: A | Discharge status: Home / Self Care | Payer: Medicare HMO | Attending: General Surgery | Admitting: General Surgery

## 2022-07-31 ENCOUNTER — Encounter (HOSPITAL_COMMUNITY)
Admission: RE | Disposition: A | Discharge status: Home / Self Care | Payer: Self-pay | Source: Home / Self Care | Attending: General Surgery

## 2022-07-31 ENCOUNTER — Ambulatory Visit (HOSPITAL_COMMUNITY): Payer: Medicare HMO | Admitting: Anesthesiology

## 2022-07-31 DIAGNOSIS — Z8042 Family history of malignant neoplasm of prostate: Secondary | ICD-10-CM | POA: Insufficient documentation

## 2022-07-31 DIAGNOSIS — Z9889 Other specified postprocedural states: Secondary | ICD-10-CM | POA: Insufficient documentation

## 2022-07-31 DIAGNOSIS — C61 Malignant neoplasm of prostate: Secondary | ICD-10-CM

## 2022-07-31 DIAGNOSIS — K838 Other specified diseases of biliary tract: Secondary | ICD-10-CM | POA: Insufficient documentation

## 2022-07-31 DIAGNOSIS — C258 Malignant neoplasm of overlapping sites of pancreas: Secondary | ICD-10-CM | POA: Diagnosis present

## 2022-07-31 DIAGNOSIS — Z87891 Personal history of nicotine dependence: Secondary | ICD-10-CM | POA: Diagnosis not present

## 2022-07-31 DIAGNOSIS — Z9049 Acquired absence of other specified parts of digestive tract: Secondary | ICD-10-CM | POA: Diagnosis not present

## 2022-07-31 DIAGNOSIS — Z800 Family history of malignant neoplasm of digestive organs: Secondary | ICD-10-CM | POA: Insufficient documentation

## 2022-07-31 DIAGNOSIS — E669 Obesity, unspecified: Secondary | ICD-10-CM | POA: Insufficient documentation

## 2022-07-31 DIAGNOSIS — K8050 Calculus of bile duct without cholangitis or cholecystitis without obstruction: Secondary | ICD-10-CM | POA: Diagnosis not present

## 2022-07-31 DIAGNOSIS — Z6831 Body mass index (BMI) 31.0-31.9, adult: Secondary | ICD-10-CM | POA: Diagnosis not present

## 2022-07-31 DIAGNOSIS — C259 Malignant neoplasm of pancreas, unspecified: Secondary | ICD-10-CM | POA: Diagnosis not present

## 2022-07-31 DIAGNOSIS — Z8546 Personal history of malignant neoplasm of prostate: Secondary | ICD-10-CM | POA: Diagnosis not present

## 2022-07-31 HISTORY — PX: PORTACATH PLACEMENT: SHX2246

## 2022-07-31 SURGERY — INSERTION, TUNNELED CENTRAL VENOUS DEVICE, WITH PORT
Anesthesia: General | Site: Chest | Laterality: Left

## 2022-07-31 MED ORDER — ONDANSETRON HCL 4 MG/2ML IJ SOLN
INTRAMUSCULAR | Status: AC
  Filled 2022-07-31: qty 2

## 2022-07-31 MED ORDER — LIDOCAINE 2% (20 MG/ML) 5 ML SYRINGE
INTRAMUSCULAR | Status: AC
  Filled 2022-07-31: qty 5

## 2022-07-31 MED ORDER — 0.9 % SODIUM CHLORIDE (POUR BTL) OPTIME
TOPICAL | Status: DC | PRN
  Administered 2022-07-31: 1000 mL

## 2022-07-31 MED ORDER — FENTANYL CITRATE (PF) 250 MCG/5ML IJ SOLN
INTRAMUSCULAR | Status: AC
  Filled 2022-07-31: qty 5

## 2022-07-31 MED ORDER — ONDANSETRON HCL 4 MG/2ML IJ SOLN: 4.0000 mg | Freq: Once | INTRAMUSCULAR | Status: DC | PRN

## 2022-07-31 MED ORDER — ONDANSETRON HCL 4 MG/2ML IJ SOLN
INTRAMUSCULAR | Status: DC | PRN
  Administered 2022-07-31: 4 mg via INTRAVENOUS

## 2022-07-31 MED ORDER — HEPARIN 6000 UNIT IRRIGATION SOLUTION
Status: AC
  Filled 2022-07-31: qty 500

## 2022-07-31 MED ORDER — LIDOCAINE HCL (PF) 1 % IJ SOLN
INTRAMUSCULAR | Status: AC
  Filled 2022-07-31: qty 30

## 2022-07-31 MED ORDER — CEFAZOLIN SODIUM-DEXTROSE 2-4 GM/100ML-% IV SOLN
INTRAVENOUS | Status: AC
  Filled 2022-07-31: qty 100

## 2022-07-31 MED ORDER — HEPARIN SOD (PORK) LOCK FLUSH 100 UNIT/ML IV SOLN
INTRAVENOUS | Status: DC | PRN
  Administered 2022-07-31: 500 [IU]

## 2022-07-31 MED ORDER — ACETAMINOPHEN 500 MG PO TABS
ORAL_TABLET | ORAL | Status: AC
  Administered 2022-07-31: 1000 mg via ORAL
  Filled 2022-07-31: qty 2

## 2022-07-31 MED ORDER — LACTATED RINGERS IV SOLN: INTRAVENOUS | Status: DC

## 2022-07-31 MED ORDER — ORAL CARE MOUTH RINSE: 15.0000 mL | Freq: Once | OROMUCOSAL | Status: AC

## 2022-07-31 MED ORDER — LIDOCAINE HCL 1 % IJ SOLN
INTRAMUSCULAR | Status: DC | PRN
  Administered 2022-07-31: 40 mL

## 2022-07-31 MED ORDER — LIDOCAINE 2% (20 MG/ML) 5 ML SYRINGE
INTRAMUSCULAR | Status: DC | PRN
  Administered 2022-07-31: 80 mg via INTRAVENOUS

## 2022-07-31 MED ORDER — CHLORHEXIDINE GLUCONATE CLOTH 2 % EX PADS: 6.0000 | MEDICATED_PAD | Freq: Once | CUTANEOUS | Status: DC

## 2022-07-31 MED ORDER — DEXAMETHASONE SODIUM PHOSPHATE 10 MG/ML IJ SOLN
INTRAMUSCULAR | Status: AC
  Filled 2022-07-31: qty 1

## 2022-07-31 MED ORDER — PHENYLEPHRINE 80 MCG/ML (10ML) SYRINGE FOR IV PUSH (FOR BLOOD PRESSURE SUPPORT)
PREFILLED_SYRINGE | INTRAVENOUS | Status: DC | PRN
  Administered 2022-07-31: 160 ug via INTRAVENOUS

## 2022-07-31 MED ORDER — EPHEDRINE 5 MG/ML INJ
INTRAVENOUS | Status: AC
  Filled 2022-07-31: qty 5

## 2022-07-31 MED ORDER — HEPARIN SOD (PORK) LOCK FLUSH 100 UNIT/ML IV SOLN
INTRAVENOUS | Status: AC
  Filled 2022-07-31: qty 5

## 2022-07-31 MED ORDER — PROPOFOL 10 MG/ML IV BOLUS
INTRAVENOUS | Status: DC | PRN
  Administered 2022-07-31: 140 mg via INTRAVENOUS

## 2022-07-31 MED ORDER — HEPARIN 6000 UNIT IRRIGATION SOLUTION
Status: DC | PRN
  Administered 2022-07-31: 1

## 2022-07-31 MED ORDER — EPHEDRINE SULFATE-NACL 50-0.9 MG/10ML-% IV SOSY
PREFILLED_SYRINGE | INTRAVENOUS | Status: DC | PRN
  Administered 2022-07-31: 10 mg via INTRAVENOUS
  Administered 2022-07-31: 5 mg via INTRAVENOUS
  Administered 2022-07-31: 10 mg via INTRAVENOUS

## 2022-07-31 MED ORDER — CHLORHEXIDINE GLUCONATE 0.12 % MT SOLN: 15.0000 mL | Freq: Once | OROMUCOSAL | Status: AC

## 2022-07-31 MED ORDER — CHLORHEXIDINE GLUCONATE 0.12 % MT SOLN
OROMUCOSAL | Status: AC
  Administered 2022-07-31: 15 mL via OROMUCOSAL
  Filled 2022-07-31: qty 15

## 2022-07-31 MED ORDER — FENTANYL CITRATE (PF) 100 MCG/2ML IJ SOLN
INTRAMUSCULAR | Status: DC | PRN
  Administered 2022-07-31 (×2): 25 ug via INTRAVENOUS

## 2022-07-31 MED ORDER — ACETAMINOPHEN 500 MG PO TABS: 1000.0000 mg | ORAL_TABLET | ORAL | Status: AC

## 2022-07-31 MED ORDER — FENTANYL CITRATE (PF) 100 MCG/2ML IJ SOLN: 25.0000 ug | INTRAMUSCULAR | Status: DC | PRN

## 2022-07-31 MED ORDER — CEFAZOLIN SODIUM-DEXTROSE 2-4 GM/100ML-% IV SOLN
2.0000 g | INTRAVENOUS | Status: AC
  Administered 2022-07-31: 2 g via INTRAVENOUS

## 2022-07-31 MED ORDER — DEXAMETHASONE SODIUM PHOSPHATE 10 MG/ML IJ SOLN
INTRAMUSCULAR | Status: DC | PRN
  Administered 2022-07-31: 10 mg via INTRAVENOUS

## 2022-07-31 MED ORDER — BUPIVACAINE-EPINEPHRINE (PF) 0.25% -1:200000 IJ SOLN
INTRAMUSCULAR | Status: AC
  Filled 2022-07-31: qty 30

## 2022-07-31 MED ORDER — OXYCODONE HCL 5 MG PO TABS: 5.0000 mg | ORAL_TABLET | Freq: Four times a day (QID) | ORAL | 0 refills | Status: DC | PRN

## 2022-07-31 MED ORDER — PHENYLEPHRINE 80 MCG/ML (10ML) SYRINGE FOR IV PUSH (FOR BLOOD PRESSURE SUPPORT)
PREFILLED_SYRINGE | INTRAVENOUS | Status: AC
  Filled 2022-07-31: qty 10

## 2022-07-31 SURGICAL SUPPLY — 42 items
ADH SKN CLS APL DERMABOND .7 (GAUZE/BANDAGES/DRESSINGS) ×1
APL PRP STRL LF DISP 70% ISPRP (MISCELLANEOUS) ×1
BAG COUNTER SPONGE SURGICOUNT (BAG) ×1 IMPLANT
BAG DECANTER FOR FLEXI CONT (MISCELLANEOUS) ×1 IMPLANT
BAG SPNG CNTER NS LX DISP (BAG) ×1
CANISTER SUCT 3000ML PPV (MISCELLANEOUS) IMPLANT
CHLORAPREP W/TINT 26 (MISCELLANEOUS) ×1 IMPLANT
COVER SURGICAL LIGHT HANDLE (MISCELLANEOUS) ×1 IMPLANT
COVER TRANSDUCER ULTRASND GEL (DISPOSABLE) IMPLANT
DERMABOND ADVANCED .7 DNX12 (GAUZE/BANDAGES/DRESSINGS) ×1 IMPLANT
DRAPE C-ARM 42X120 X-RAY (DRAPES) ×1 IMPLANT
DRAPE CHEST BREAST 15X10 FENES (DRAPES) ×1 IMPLANT
DRSG TEGADERM 4X4.75 (GAUZE/BANDAGES/DRESSINGS) IMPLANT
ELECT COATED BLADE 2.86 ST (ELECTRODE) ×1 IMPLANT
ELECT REM PT RETURN 9FT ADLT (ELECTROSURGICAL) ×1
ELECTRODE REM PT RTRN 9FT ADLT (ELECTROSURGICAL) ×1 IMPLANT
GAUZE SPONGE 4X4 12PLY STRL LF (GAUZE/BANDAGES/DRESSINGS) IMPLANT
GEL ULTRASOUND 20GR AQUASONIC (MISCELLANEOUS) IMPLANT
GLOVE BIO SURGEON STRL SZ 6 (GLOVE) ×1 IMPLANT
GLOVE INDICATOR 6.5 STRL GRN (GLOVE) ×1 IMPLANT
GOWN STRL REUS W/ TWL LRG LVL3 (GOWN DISPOSABLE) ×1 IMPLANT
GOWN STRL REUS W/TWL 2XL LVL3 (GOWN DISPOSABLE) ×1 IMPLANT
GOWN STRL REUS W/TWL LRG LVL3 (GOWN DISPOSABLE) ×1
IV CONNECTOR ONE LINK NDLESS (IV SETS) IMPLANT
KIT BASIN OR (CUSTOM PROCEDURE TRAY) ×1 IMPLANT
KIT PORT POWER 8FR ISP CVUE (Port) IMPLANT
KIT TURNOVER KIT B (KITS) ×1 IMPLANT
NDL 22X1.5 STRL (OR ONLY) (MISCELLANEOUS) ×1 IMPLANT
NEEDLE 22X1.5 STRL (OR ONLY) (MISCELLANEOUS) ×1 IMPLANT
NS IRRIG 1000ML POUR BTL (IV SOLUTION) ×1 IMPLANT
PAD ARMBOARD 7.5X6 YLW CONV (MISCELLANEOUS) ×1 IMPLANT
PENCIL BUTTON HOLSTER BLD 10FT (ELECTRODE) ×1 IMPLANT
POSITIONER HEAD DONUT 9IN (MISCELLANEOUS) ×1 IMPLANT
SUT MON AB 4-0 PC3 18 (SUTURE) ×1 IMPLANT
SUT PROLENE 2 0 SH DA (SUTURE) ×2 IMPLANT
SUT VIC AB 3-0 SH 27 (SUTURE) ×1
SUT VIC AB 3-0 SH 27X BRD (SUTURE) ×1 IMPLANT
SYR 5ML LUER SLIP (SYRINGE) ×1 IMPLANT
TOWEL GREEN STERILE (TOWEL DISPOSABLE) ×1 IMPLANT
TRAY LAPAROSCOPIC MC (CUSTOM PROCEDURE TRAY) ×1 IMPLANT
TUBE CONNECTING 12X1/4 (SUCTIONS) IMPLANT
YANKAUER SUCT BULB TIP NO VENT (SUCTIONS) IMPLANT

## 2022-07-31 NOTE — Transfer of Care (Signed)
Immediate Anesthesia Transfer of Care Note  Patient: Alycia Patten  Procedure(s) Performed: INSERTION PORT-A-CATH (Left: Chest)  Patient Location: PACU  Anesthesia Type:General  Level of Consciousness: awake, alert  and oriented  Airway & Oxygen Therapy: Patient Spontanous Breathing and Patient connected to face mask oxygen  Post-op Assessment: Report given to RN and Post -op Vital signs reviewed and stable  Post vital signs: Reviewed and stable  Last Vitals:  Vitals Value Taken Time  BP 128/94 07/31/22 1150  Temp    Pulse 57 07/31/22 1152  Resp 18 07/31/22 1152  SpO2 100 % 07/31/22 1152  Vitals shown include unvalidated device data.  Last Pain:  Vitals:   07/31/22 0910  TempSrc:   PainSc: 2          Complications: No notable events documented.

## 2022-07-31 NOTE — Anesthesia Preprocedure Evaluation (Addendum)
Anesthesia Evaluation  Patient identified by MRN, date of birth, ID band Patient awake    Reviewed: Allergy & Precautions, NPO status , Patient's Chart, lab work & pertinent test results  Airway Mallampati: II  TM Distance: >3 FB Neck ROM: Full    Dental  (+) Teeth Intact, Dental Advisory Given, Caps,    Pulmonary former smoker   Pulmonary exam normal breath sounds clear to auscultation       Cardiovascular negative cardio ROS Normal cardiovascular exam Rhythm:Regular Rate:Normal     Neuro/Psych negative neurological ROS  negative psych ROS   GI/Hepatic Neg liver ROS,GERD  ,,Pancreatic cancer    Endo/Other  Obesity   Renal/GU Renal InsufficiencyRenal disease   Prostate cancer     Musculoskeletal  (+) Arthritis ,    Abdominal   Peds  Hematology negative hematology ROS (+)   Anesthesia Other Findings Day of surgery medications reviewed with the patient.  Reproductive/Obstetrics                             Anesthesia Physical Anesthesia Plan  ASA: 4  Anesthesia Plan: General   Post-op Pain Management: Tylenol PO (pre-op)*   Induction: Intravenous  PONV Risk Score and Plan: 2 and Midazolam, Dexamethasone and Ondansetron  Airway Management Planned: LMA  Additional Equipment:   Intra-op Plan:   Post-operative Plan: Extubation in OR  Informed Consent: I have reviewed the patients History and Physical, chart, labs and discussed the procedure including the risks, benefits and alternatives for the proposed anesthesia with the patient or authorized representative who has indicated his/her understanding and acceptance.     Dental advisory given  Plan Discussed with: CRNA  Anesthesia Plan Comments:         Anesthesia Quick Evaluation

## 2022-07-31 NOTE — Interval H&P Note (Signed)
History and Physical Interval Note:  07/31/2022 10:30 AM  Jared [name]  has presented today for surgery, with the diagnosis of PANCREATIC CANCER.  The various methods of treatment have been discussed with the patient and family. After consideration of risks, benefits and other options for treatment, the patient has consented to  Procedure(s): INSERTION PORT-A-CATH WITH ULTRASOUND GUIDANCE (N/A) as a surgical intervention.  The patient's history has been reviewed, patient examined, no change in status, stable for surgery.  I have reviewed the patient's chart and labs.  Questions were answered to the patient's satisfaction.     Stark Klein

## 2022-07-31 NOTE — Discharge Instructions (Addendum)
[facility] Office Phone Number [phone]   POST OP INSTRUCTIONS  Always review your discharge instruction sheet given to you by the facility where your surgery was performed.  IF YOU HAVE DISABILITY OR FAMILY LEAVE FORMS, YOU MUST BRING THEM TO THE OFFICE FOR PROCESSING.  DO NOT GIVE THEM TO YOUR DOCTOR.  Take 2 tylenol (acetominophen) three times a day for 3 days.  If you still have pain, add ibuprofen with food in between if able to take this (if you have kidney issues or stomach issues, do not take ibuprofen).  If both of those are not enough, add the narcotic pain pill.  If you find you are needing a lot of this overnight after surgery, call the next morning for a refill.   Take your usually prescribed medications unless otherwise directed If you need a refill on your pain medication, please contact your pharmacy.  They will contact our office to request authorization.  Prescriptions will not be filled after 5pm or on week-ends. You should eat very light the first 24 hours after surgery, such as soup, crackers, pudding, etc.  Resume your normal diet the day after surgery It is common to experience some constipation if taking pain medication after surgery.  Increasing fluid intake and taking a stool softener will usually help or prevent this problem from occurring.  A mild laxative (Milk of Magnesia or Miralax) should be taken according to package directions if there are no bowel movements after 48 hours. You may shower in 48 hours.  The surgical glue will flake off in 2-3 weeks.   ACTIVITIES:  No strenuous activity or heavy lifting for 1 week.   You may drive when you no longer are taking prescription pain medication, you can comfortably wear a seatbelt, and you can safely maneuver your car and apply brakes. RETURN TO WORK:  __________to be determined. _______________ You should see your doctor in the office for a follow-up appointment approximately three-four weeks after  your surgery.    WHEN TO CALL YOUR DOCTOR: Fever over 101.0 Nausea and/or vomiting. Extreme swelling or bruising. Continued bleeding from incision. Increased pain, redness, or drainage from the incision.  The clinic staff is available to answer your questions during regular business hours.  Please don't hesitate to call and ask to speak to one of the nurses for clinical concerns.  If you have a medical emergency, go to the nearest emergency room or call 911.  A surgeon from [facility] is always on call at the hospital.  For further questions, please visit [email or web address]   

## 2022-07-31 NOTE — Op Note (Signed)
PREOPERATIVE DIAGNOSIS:  adenocarcinoma pancreatic body/tail, cT2-3N0     POSTOPERATIVE DIAGNOSIS:  Same     PROCEDURE: Left subclavian port placement, [employer] ClearVue Power Port, MRI safe, 8-French.      SURGEON:  Stark Klein, MD      ANESTHESIA:  General   FINDINGS:  Good venous return, easy flush, and tip of the catheter and   SVC 25 cm.      SPECIMEN:  None.      ESTIMATED BLOOD LOSS:  Minimal.      COMPLICATIONS:  None known.      PROCEDURE:  Pt was identified in the holding area and taken to   the operating room, where patient was placed supine on the operating room   table.  General anesthesia was induced.  Patient's arms were tucked and the upper   chest and neck were prepped and draped in sterile fashion.  Time-out was   performed according to the surgical safety check list.  When all was   correct, we continued.   Local anesthetic was administered over this   area at the angle of the clavicle.  The vein was accessed with 2 pass(es) of the needle. There was good venous return and the wire passed easily with no ectopy.   Fluoroscopy was used to confirm that the wire was in the vena cava.      The patient was placed back level and the area for the pocket was anethetized   with local anesthetic.  A 3-cm transverse incision was made with a #15   blade.  Cautery was used to divide the subcutaneous tissues down to the   pectoralis muscle.  An [employer] retractor was used to elevate the skin   while a pocket was created on top of the pectoralis fascia.  The port   was placed into the pocket to confirm that it was of adequate size.  The   catheter was preattached to the port.  The port was then secured to the   pectoralis fascia with four 2-0 Prolene sutures.  These were clamped and   not tied down yet.    The catheter was tunneled through to the wire exit   site.  The catheter was placed along the wire to determine what length it should be to be in the SVC.  The catheter was  cut at 25 cm.  The tunneler sheath and dilator were passed over the wire and the dilator and wire were removed.  The catheter was advanced through the tunneler sheath and the tunneler sheath was pulled away.  Care was taken to keep the catheter in the tunneler sheath as this occurred. This was advanced and the tunneler sheath was removed.  There was good venous  return and easy flush of the catheter.  The Prolene sutures were tied   down to the pectoral fascia.  The skin was reapproximated using 3-0   Vicryl interrupted deep dermal sutures.    Fluoroscopy was used to re-confirm good position of the catheter.  The skin   was then closed using 4-0 Monocryl in a subcuticular fashion.  The port was flushed with concentrated heparin flush as well.  The port was left accessed.    The wounds were then cleaned, dried, and dressed with Dermabond.  The patient was awakened from anesthesia and taken to the PACU in stable condition.  Needle, sponge, and instrument counts were correct.  Stark Klein, MD     \

## 2022-07-31 NOTE — Anesthesia Procedure Notes (Signed)
Procedure Name: LMA Insertion Date/Time: 07/31/2022 10:54 AM  Performed by: Genelle Bal, CRNAPre-anesthesia Checklist: Patient identified, Emergency Drugs available, Suction available and Patient being monitored Patient Re-evaluated:Patient Re-evaluated prior to induction Oxygen Delivery Method: Circle system utilized Preoxygenation: Pre-oxygenation with 100% oxygen Induction Type: IV induction Ventilation: Mask ventilation without difficulty LMA: LMA inserted LMA Size: 5.0 Number of attempts: 1 Airway Equipment and Method: Bite block Placement Confirmation: positive ETCO2 Tube secured with: Tape Dental Injury: Teeth and Oropharynx as per pre-operative assessment

## 2022-08-01 ENCOUNTER — Other Ambulatory Visit: Payer: Self-pay | Admitting: Oncology

## 2022-08-01 ENCOUNTER — Other Ambulatory Visit: Payer: Self-pay | Admitting: *Deleted

## 2022-08-01 ENCOUNTER — Encounter (HOSPITAL_COMMUNITY): Payer: Self-pay | Admitting: General Surgery

## 2022-08-01 ENCOUNTER — Inpatient Hospital Stay: Payer: Medicare HMO | Attending: Oncology

## 2022-08-01 ENCOUNTER — Encounter: Payer: Self-pay | Admitting: *Deleted

## 2022-08-01 VITALS — BP 146/70 | HR 47 | Resp 20 | Ht 72.0 in | Wt 230.0 lb

## 2022-08-01 DIAGNOSIS — Z5189 Encounter for other specified aftercare: Secondary | ICD-10-CM | POA: Diagnosis not present

## 2022-08-01 DIAGNOSIS — T451X5A Adverse effect of antineoplastic and immunosuppressive drugs, initial encounter: Secondary | ICD-10-CM | POA: Diagnosis not present

## 2022-08-01 DIAGNOSIS — C251 Malignant neoplasm of body of pancreas: Secondary | ICD-10-CM | POA: Diagnosis present

## 2022-08-01 DIAGNOSIS — Z9079 Acquired absence of other genital organ(s): Secondary | ICD-10-CM | POA: Insufficient documentation

## 2022-08-01 DIAGNOSIS — Z5111 Encounter for antineoplastic chemotherapy: Secondary | ICD-10-CM | POA: Diagnosis present

## 2022-08-01 DIAGNOSIS — D701 Agranulocytosis secondary to cancer chemotherapy: Secondary | ICD-10-CM | POA: Insufficient documentation

## 2022-08-01 DIAGNOSIS — Z8546 Personal history of malignant neoplasm of prostate: Secondary | ICD-10-CM | POA: Insufficient documentation

## 2022-08-01 MED ORDER — PALONOSETRON HCL INJECTION 0.25 MG/5ML
0.2500 mg | Freq: Once | INTRAVENOUS | Status: AC
  Administered 2022-08-01: 0.25 mg via INTRAVENOUS
  Filled 2022-08-01: qty 5

## 2022-08-01 MED ORDER — LEUCOVORIN CALCIUM INJECTION 350 MG
400.0000 mg/m2 | Freq: Once | INTRAVENOUS | Status: AC
  Administered 2022-08-01: 940 mg via INTRAVENOUS
  Filled 2022-08-01: qty 47

## 2022-08-01 MED ORDER — OXYCODONE HCL 5 MG PO TABS
10.0000 mg | ORAL_TABLET | Freq: Once | ORAL | Status: AC
  Administered 2022-08-01: 10 mg via ORAL
  Filled 2022-08-01: qty 2

## 2022-08-01 MED ORDER — SODIUM CHLORIDE 0.9 % IV SOLN
2400.0000 mg/m2 | INTRAVENOUS | Status: DC
  Administered 2022-08-01: 5650 mg via INTRAVENOUS
  Filled 2022-08-01: qty 113

## 2022-08-01 MED ORDER — FAMOTIDINE IN NACL 20-0.9 MG/50ML-% IV SOLN
20.0000 mg | Freq: Once | INTRAVENOUS | Status: AC
  Administered 2022-08-01: 20 mg via INTRAVENOUS
  Filled 2022-08-01: qty 50

## 2022-08-01 MED ORDER — SODIUM CHLORIDE 0.9 % IV SOLN
10.0000 mg | Freq: Once | INTRAVENOUS | Status: AC
  Administered 2022-08-01: 10 mg via INTRAVENOUS
  Filled 2022-08-01: qty 1

## 2022-08-01 MED ORDER — FAMOTIDINE IN NACL 20-0.9 MG/50ML-% IV SOLN: 20.0000 mg | Freq: Once | INTRAVENOUS | Status: DC

## 2022-08-01 MED ORDER — HYDROMORPHONE HCL 4 MG PO TABS: 4.0000 mg | ORAL_TABLET | ORAL | 0 refills | Status: DC | PRN

## 2022-08-01 MED ORDER — DEXTROSE 5 % IV SOLN: Freq: Once | INTRAVENOUS | Status: AC

## 2022-08-01 MED ORDER — OXALIPLATIN CHEMO INJECTION 100 MG/20ML
85.0000 mg/m2 | Freq: Once | INTRAVENOUS | Status: AC
  Administered 2022-08-01: 200 mg via INTRAVENOUS
  Filled 2022-08-01: qty 40

## 2022-08-01 MED ORDER — OXYCODONE HCL 5 MG PO TABS: 10.0000 mg | ORAL_TABLET | Freq: Once | ORAL | Status: DC

## 2022-08-01 NOTE — Anesthesia Postprocedure Evaluation (Signed)
Anesthesia Post Note  Patient: Jared [name]  Procedure(s) Performed: INSERTION PORT-A-CATH (Left: Chest)     Patient location during evaluation: PACU Anesthesia Type: General Level of consciousness: awake and alert Pain management: pain level controlled Vital Signs Assessment: post-procedure vital signs reviewed and stable Respiratory status: spontaneous breathing, nonlabored ventilation, respiratory function stable and patient connected to nasal cannula oxygen Cardiovascular status: blood pressure returned to baseline and stable Postop Assessment: no apparent nausea or vomiting Anesthetic complications: no   No notable events documented.  Last Vitals:  Vitals:   07/31/22 1330 07/31/22 1345  BP: 127/86 124/87  Pulse: (!) 51 (!) 56  Resp: 16 19  Temp:  36.6 C  SpO2: 98% 98%    Last Pain:  Vitals:   07/31/22 1345  TempSrc:   PainSc: 0-No pain                 Santa Lighter

## 2022-08-01 NOTE — Progress Notes (Signed)
Ok to treat per MD with 9/24 labs

## 2022-08-01 NOTE — Patient Instructions (Addendum)
[place]  The chemotherapy medication bag should finish at 46 hours, 96 hours, or 7 days. For example, if your pump is scheduled for 46 hours and it was put on at 4:00 p.m., it should finish at 2:00 p.m. the day it is scheduled to come off regardless of your appointment time.     Estimated time to finish at 12:45 Friday , August 03, 2022.   If the display on your pump reads "Low Volume" and it is beeping, take the batteries out of the pump and come to the cancer center for it to be taken off.   If the pump alarms go off prior to the pump reading "Low Volume" then call [phone] and someone can assist you.  If the plunger comes out and the chemotherapy medication is leaking out, please use your home chemo spill kit to clean up the spill. Do NOT use paper towels or other household products.  If you have problems or questions regarding your pump, please call either [phone] (24 hours a day) or the cancer center Monday-Friday 8:00 a.m.- 4:30 p.m. at the clinic number and we will assist you. If you are unable to get assistance, then go to the nearest Emergency Department and ask the staff to contact the IV team for assistance.   Discharge Instructions: Thank you for choosing [facility] to provide your oncology and hematology care.   If you have a lab appointment with the [facility], please go directly to the [facility] and check in at the registration area.   Wear comfortable clothing and clothing appropriate for easy access to any Portacath or PICC line.   We strive to give you quality time with your provider. You may need to reschedule your appointment if you arrive late (15 or more minutes).  Arriving late affects you and other patients whose appointments are after yours.  Also, if you miss three or more appointments without notifying the office, you may be dismissed from the clinic at the provider's discretion.      For prescription  refill requests, have your pharmacy contact our office and allow 72 hours for refills to be completed.    Today you received the following chemotherapy and/or immunotherapy agents Oxaliplatin, Leucovorin, Fluorouracil      To help prevent nausea and vomiting after your treatment, we encourage you to take your nausea medication as directed.  BELOW ARE SYMPTOMS THAT SHOULD BE REPORTED IMMEDIATELY: *FEVER GREATER THAN 100.4 F (38 C) OR HIGHER *CHILLS OR SWEATING *NAUSEA AND VOMITING THAT IS NOT CONTROLLED WITH YOUR NAUSEA MEDICATION *UNUSUAL SHORTNESS OF BREATH *UNUSUAL BRUISING OR BLEEDING *URINARY PROBLEMS (pain or burning when urinating, or frequent urination) *BOWEL PROBLEMS (unusual diarrhea, constipation, pain near the anus) TENDERNESS IN MOUTH AND THROAT WITH OR WITHOUT PRESENCE OF ULCERS (sore throat, sores in mouth, or a toothache) UNUSUAL RASH, SWELLING OR PAIN  UNUSUAL VAGINAL DISCHARGE OR ITCHING   Items with * indicate a potential emergency and should be followed up as soon as possible or go to the Emergency Department if any problems should occur.  Please show the CHEMOTHERAPY ALERT CARD or IMMUNOTHERAPY ALERT CARD at check-in to the Emergency Department and triage nurse.  Should you have questions after your visit or need to cancel or reschedule your appointment, please contact [facility]  Dept: [phone]  and follow the prompts.  Office hours are 8:00 a.m. to 4:30 p.m. Monday - Friday. Please note that  voicemails left after 4:00 p.m. may not be returned until the following business day.  We are closed weekends and major holidays. You have access to a nurse at all times for urgent questions. Please call the main number to the clinic Dept: [phone] and follow the prompts.   For any non-urgent questions, you may also contact your provider using MyChart. We now offer e-Visits for anyone 83 and older to request care online for non-urgent symptoms.  For details visit [email or web address].   Also download the MyChart app! Go to the app store, search "MyChart", open the app, select [facility], and log in with your MyChart username and password.  Masks are optional in the cancer centers. If you would like for your care team to wear a mask while they are taking care of you, please let them know. You may have one support person who is at least 77 years old accompany you for your appointments.  Oxaliplatin Injection What is this medication? OXALIPLATIN (ox AL i PLA tin) treats some types of cancer. It works by slowing down the growth of cancer cells. This medicine may be used for other purposes; ask your health care provider or pharmacist if you have questions. COMMON BRAND NAME(S): Eloxatin What should I tell my care team before I take this medication? They need to know if you have any of these conditions: Heart disease History of irregular heartbeat or rhythm Liver disease Low blood cell levels (white cells, red cells, and platelets) Lung or breathing disease, such as asthma Take medications that treat or prevent blood clots Tingling of the fingers, toes, or other nerve disorder An unusual or allergic reaction to oxaliplatin, other medications, foods, dyes, or preservatives If you or your partner are pregnant or trying to get pregnant Breast-feeding How should I use this medication? This medication is injected into a vein. It is given by your care team in a hospital or clinic setting. Talk to your care team about the use of this medication in children. Special care may be needed. Overdosage: If you think you have taken too much of this medicine contact a poison control center or emergency room at once. NOTE: This medicine is only for you. Do not share this medicine with others. What if I miss a dose? Keep appointments for follow-up doses. It is important not to miss a dose. Call your care team if you are unable to keep an  appointment. What may interact with this medication? Do not take this medication with any of the following: Cisapride Dronedarone Pimozide Thioridazine This medication may also interact with the following: Aspirin and aspirin-like medications Certain medications that treat or prevent blood clots, such as warfarin, apixaban, dabigatran, and rivaroxaban Cisplatin Cyclosporine Diuretics Medications for infection, such as acyclovir, adefovir, amphotericin B, bacitracin, cidofovir, foscarnet, ganciclovir, gentamicin, pentamidine, vancomycin NSAIDs, medications for pain and inflammation, such as ibuprofen or naproxen Other medications that cause heart rhythm changes Pamidronate Zoledronic acid This list may not describe all possible interactions. Give your health care provider a list of all the medicines, herbs, non-prescription drugs, or dietary supplements you use. Also tell them if you smoke, drink alcohol, or use illegal drugs. Some items may interact with your medicine. What should I watch for while using this medication? Your condition will be monitored carefully while you are receiving this medication. You may need blood work while taking this medication. This medication may make you feel generally unwell. This is not uncommon as chemotherapy can affect healthy cells as well  as cancer cells. Report any side effects. Continue your course of treatment even though you feel ill unless your care team tells you to stop. This medication may increase your risk of getting an infection. Call your care team for advice if you get a fever, chills, sore throat, or other symptoms of a cold or flu. Do not treat yourself. Try to avoid being around people who are sick. Avoid taking medications that contain aspirin, acetaminophen, ibuprofen, naproxen, or ketoprofen unless instructed by your care team. These medications may hide a fever. Be careful brushing or flossing your teeth or using a toothpick because  you may get an infection or bleed more easily. If you have any dental work done, tell your dentist you are receiving this medication. This medication can make you more sensitive to cold. Do not drink cold drinks or use ice. Cover exposed skin before coming in contact with cold temperatures or cold objects. When out in cold weather wear warm clothing and cover your mouth and nose to warm the air that goes into your lungs. Tell your care team if you get sensitive to the cold. Talk to your care team if you or your partner are pregnant or think either of you might be pregnant. This medication can cause serious birth defects if taken during pregnancy and for 9 months after the last dose. A negative pregnancy test is required before starting this medication. A reliable form of contraception is recommended while taking this medication and for 9 months after the last dose. Talk to your care team about effective forms of contraception. Do not father a child while taking this medication and for 6 months after the last dose. Use a condom while having sex during this time period. Do not breastfeed while taking this medication and for 3 months after the last dose. This medication may cause infertility. Talk to your care team if you are concerned about your fertility. What side effects may I notice from receiving this medication? Side effects that you should report to your care team as soon as possible: Allergic reactions--skin rash, itching, hives, swelling of the face, lips, tongue, or throat Bleeding--bloody or black, tar-like stools, vomiting blood or brown material that looks like coffee grounds, red or dark brown urine, small red or purple spots on skin, unusual bruising or bleeding Dry cough, shortness of breath or trouble breathing Heart rhythm changes--fast or irregular heartbeat, dizziness, feeling faint or lightheaded, chest pain, trouble breathing Infection--fever, chills, cough, sore throat, wounds that  don't heal, pain or trouble when passing urine, general feeling of discomfort or being unwell Liver injury--right upper belly pain, loss of appetite, nausea, light-colored stool, dark yellow or brown urine, yellowing skin or eyes, unusual weakness or fatigue Low red blood cell level--unusual weakness or fatigue, dizziness, headache, trouble breathing Muscle injury--unusual weakness or fatigue, muscle pain, dark yellow or brown urine, decrease in amount of urine Pain, tingling, or numbness in the hands or feet Sudden and severe headache, confusion, change in vision, seizures, which may be signs of posterior reversible encephalopathy syndrome (PRES) Unusual bruising or bleeding Side effects that usually do not require medical attention (report to your care team if they continue or are bothersome): Diarrhea Nausea Pain, redness, or swelling with sores inside the mouth or throat Unusual weakness or fatigue Vomiting This list may not describe all possible side effects. Call your doctor for medical advice about side effects. You may report side effects to FDA at 1-800-FDA-1088. Where should I keep my  medication? This medication is given in a hospital or clinic. It will not be stored at home. NOTE: This sheet is a summary. It may not cover all possible information. If you have questions about this medicine, talk to your doctor, pharmacist, or health care provider.  2023 Elsevier/Gold Standard (2022-02-09 00:00:00)  Leucovorin Injection What is this medication? LEUCOVORIN (loo koe VOR in) prevents side effects from certain medications, such as methotrexate. It works by increasing folate levels. This helps protect healthy cells in your body. It may also be used to treat anemia caused by low levels of folate. It can also be used with fluorouracil, a type of chemotherapy, to treat colorectal cancer. It works by increasing the effects of fluorouracil in the body. This medicine may be used for other  purposes; ask your health care provider or pharmacist if you have questions. What should I tell my care team before I take this medication? They need to know if you have any of these conditions: Anemia from low levels of vitamin B12 in the blood An unusual or allergic reaction to leucovorin, folic acid, other medications, foods, dyes, or preservatives Pregnant or trying to get pregnant Breastfeeding How should I use this medication? This medication is injected into a vein or a muscle. It is given by your care team in a hospital or clinic setting. Talk to your care team about the use of this medication in children. Special care may be needed. Overdosage: If you think you have taken too much of this medicine contact a poison control center or emergency room at once. NOTE: This medicine is only for you. Do not share this medicine with others. What if I miss a dose? Keep appointments for follow-up doses. It is important not to miss your dose. Call your care team if you are unable to keep an appointment. What may interact with this medication? Capecitabine Fluorouracil Phenobarbital Phenytoin Primidone Trimethoprim;sulfamethoxazole This list may not describe all possible interactions. Give your health care provider a list of all the medicines, herbs, non-prescription drugs, or dietary supplements you use. Also tell them if you smoke, drink alcohol, or use illegal drugs. Some items may interact with your medicine. What should I watch for while using this medication? Your condition will be monitored carefully while you are receiving this medication. This medication may increase the side effects of 5-fluorouracil. Tell your care team if you have diarrhea or mouth sores that do not get better or that get worse. What side effects may I notice from receiving this medication? Side effects that you should report to your care team as soon as possible: Allergic reactions--skin rash, itching, hives,  swelling of the face, lips, tongue, or throat This list may not describe all possible side effects. Call your doctor for medical advice about side effects. You may report side effects to FDA at 1-800-FDA-1088. Where should I keep my medication? This medication is given in a hospital or clinic. It will not be stored at home. NOTE: This sheet is a summary. It may not cover all possible information. If you have questions about this medicine, talk to your doctor, pharmacist, or health care provider.  2023 Elsevier/Gold Standard (2022-02-23 00:00:00)  Fluorouracil Injection What is this medication? FLUOROURACIL (flure oh YOOR a sil) treats some types of cancer. It works by slowing down the growth of cancer cells. This medicine may be used for other purposes; ask your health care provider or pharmacist if you have questions. COMMON BRAND NAME(S): Adrucil What should I  tell my care team before I take this medication? They need to know if you have any of these conditions: Blood disorders Dihydropyrimidine dehydrogenase (DPD) deficiency Infection, such as chickenpox, cold sores, herpes Kidney disease Liver disease Poor nutrition Recent or ongoing radiation therapy An unusual or allergic reaction to fluorouracil, other medications, foods, dyes, or preservatives If you or your partner are pregnant or trying to get pregnant Breast-feeding How should I use this medication? This medication is injected into a vein. It is administered by your care team in a hospital or clinic setting. Talk to your care team about the use of this medication in children. Special care may be needed. Overdosage: If you think you have taken too much of this medicine contact a poison control center or emergency room at once. NOTE: This medicine is only for you. Do not share this medicine with others. What if I miss a dose? Keep appointments for follow-up doses. It is important not to miss your dose. Call your care team if  you are unable to keep an appointment. What may interact with this medication? Do not take this medication with any of the following: Live virus vaccines This medication may also interact with the following: Medications that treat or prevent blood clots, such as warfarin, enoxaparin, dalteparin This list may not describe all possible interactions. Give your health care provider a list of all the medicines, herbs, non-prescription drugs, or dietary supplements you use. Also tell them if you smoke, drink alcohol, or use illegal drugs. Some items may interact with your medicine. What should I watch for while using this medication? Your condition will be monitored carefully while you are receiving this medication. This medication may make you feel generally unwell. This is not uncommon as chemotherapy can affect healthy cells as well as cancer cells. Report any side effects. Continue your course of treatment even though you feel ill unless your care team tells you to stop. In some cases, you may be given additional medications to help with side effects. Follow all directions for their use. This medication may increase your risk of getting an infection. Call your care team for advice if you get a fever, chills, sore throat, or other symptoms of a cold or flu. Do not treat yourself. Try to avoid being around people who are sick. This medication may increase your risk to bruise or bleed. Call your care team if you notice any unusual bleeding. Be careful brushing or flossing your teeth or using a toothpick because you may get an infection or bleed more easily. If you have any dental work done, tell your dentist you are receiving this medication. Avoid taking medications that contain aspirin, acetaminophen, ibuprofen, naproxen, or ketoprofen unless instructed by your care team. These medications may hide a fever. Do not treat diarrhea with over the counter products. Contact your care team if you have diarrhea  that lasts more than 2 days or if it is severe and watery. This medication can make you more sensitive to the sun. Keep out of the sun. If you cannot avoid being in the sun, wear protective clothing and sunscreen. Do not use sun lamps, tanning beds, or tanning booths. Talk to your care team if you or your partner wish to become pregnant or think you might be pregnant. This medication can cause serious birth defects if taken during pregnancy and for 3 months after the last dose. A reliable form of contraception is recommended while taking this medication and for 3 months  after the last dose. Talk to your care team about effective forms of contraception. Do not father a child while taking this medication and for 3 months after the last dose. Use a condom while having sex during this time period. Do not breastfeed while taking this medication. This medication may cause infertility. Talk to your care team if you are concerned about your fertility. What side effects may I notice from receiving this medication? Side effects that you should report to your care team as soon as possible: Allergic reactions--skin rash, itching, hives, swelling of the face, lips, tongue, or throat Heart attack--pain or tightness in the chest, shoulders, arms, or jaw, nausea, shortness of breath, cold or clammy skin, feeling faint or lightheaded Heart failure--shortness of breath, swelling of the ankles, feet, or hands, sudden weight gain, unusual weakness or fatigue Heart rhythm changes--fast or irregular heartbeat, dizziness, feeling faint or lightheaded, chest pain, trouble breathing High ammonia level--unusual weakness or fatigue, confusion, loss of appetite, nausea, vomiting, seizures Infection--fever, chills, cough, sore throat, wounds that don't heal, pain or trouble when passing urine, general feeling of discomfort or being unwell Low red blood cell level--unusual weakness or fatigue, dizziness, headache, trouble  breathing Pain, tingling, or numbness in the hands or feet, muscle weakness, change in vision, confusion or trouble speaking, loss of balance or coordination, trouble walking, seizures Redness, swelling, and blistering of the skin over hands and feet Severe or prolonged diarrhea Unusual bruising or bleeding Side effects that usually do not require medical attention (report to your care team if they continue or are bothersome): Dry skin Headache Increased tears Nausea Pain, redness, or swelling with sores inside the mouth or throat Sensitivity to light Vomiting This list may not describe all possible side effects. Call your doctor for medical advice about side effects. You may report side effects to FDA at 1-800-FDA-1088. Where should I keep my medication? This medication is given in a hospital or clinic. It will not be stored at home. NOTE: This sheet is a summary. It may not cover all possible information. If you have questions about this medicine, talk to your doctor, pharmacist, or health care provider.  2023 Elsevier/Gold Standard (2022-02-20 00:00:00)

## 2022-08-01 NOTE — Progress Notes (Signed)
PATIENT NAVIGATOR PROGRESS NOTE  Name: Jared [name] Date: 08/01/2022 MRN: [id number]  DOB: 01/02/1946   Reason for visit:  Met with [name] during tx. He is experiencing pain mid abdomen which he describes as sharp and rates it an "8"  discussed with Dr [name] and orders entered for pain medicine and pepcid during treatment today  Comments:  First treatment    Time spent counseling/coordinating care: 45-60 minutes

## 2022-08-01 NOTE — Progress Notes (Signed)
Pharmacist Chemotherapy Monitoring - Initial Assessment    Anticipated start date: 08/01/22   The following has been reviewed per standard work regarding the patient's treatment regimen: The patient's diagnosis, treatment plan and drug doses, and organ/hematologic function Lab orders and baseline tests specific to treatment regimen  The treatment plan start date, drug sequencing, and pre-medications Prior authorization status  Patient's documented medication list, including drug-drug interaction screen and prescriptions for anti-emetics and supportive care specific to the treatment regimen The drug concentrations, fluid compatibility, administration routes, and timing of the medications to be used The patient's access for treatment and lifetime cumulative dose history, if applicable  The patient's medication allergies and previous infusion related reactions, if applicable   Changes made to treatment plan:  N/A  Follow up needed:  N/A   Patrica Duel, [facility], 08/01/2022  9:49 AM

## 2022-08-02 ENCOUNTER — Inpatient Hospital Stay: Payer: Medicare HMO

## 2022-08-02 ENCOUNTER — Telehealth: Payer: Self-pay | Admitting: *Deleted

## 2022-08-02 ENCOUNTER — Encounter: Payer: Self-pay | Admitting: Oncology

## 2022-08-02 NOTE — Telephone Encounter (Signed)
Jared [name] had called [facility] last night reporting N/V. Had vomited up his anti-emetic. Was informed OK to repeat dose. Called him today and he is feeling much better. Has had occasional hiccups that last < 1 minute. Informed him this could be due to the dexamethasone he received for premed. Nothing to do if it is only a brief period and it will subside soon.

## 2022-08-02 NOTE — Telephone Encounter (Signed)
Patient reports swelling/redness at port site. Instructed him to come to office now and he agrees.

## 2022-08-02 NOTE — Progress Notes (Signed)
Pt here today with complaints of itching and redness at the incision site where port was placed on 07/31/22. Dr [name] at chairside.  Dressing removed and Dr [name] accessed site. Dr stated it looks like the glue may be irritating the skin and the itching from healing. No sign of drainage, warmth or bleeding. Per Dr verbal order redress port with sensitive dressing. When pt returns for pump stop tomorrow, may want to place steri-strips and neosporin if incision is not completely intact.

## 2022-08-03 ENCOUNTER — Inpatient Hospital Stay: Payer: Medicare HMO

## 2022-08-03 VITALS — BP 124/83 | HR 62 | Temp 98.2°F | Resp 20

## 2022-08-03 DIAGNOSIS — Z5111 Encounter for antineoplastic chemotherapy: Secondary | ICD-10-CM | POA: Diagnosis not present

## 2022-08-03 DIAGNOSIS — C251 Malignant neoplasm of body of pancreas: Secondary | ICD-10-CM

## 2022-08-03 MED ORDER — SODIUM CHLORIDE 0.9% FLUSH
10.0000 mL | INTRAVENOUS | Status: DC | PRN
  Administered 2022-08-03: 10 mL

## 2022-08-03 MED ORDER — HEPARIN SOD (PORK) LOCK FLUSH 100 UNIT/ML IV SOLN
500.0000 [IU] | Freq: Once | INTRAVENOUS | Status: AC | PRN
  Administered 2022-08-03: 500 [IU]

## 2022-08-03 NOTE — Patient Instructions (Signed)

## 2022-08-11 ENCOUNTER — Other Ambulatory Visit: Payer: Self-pay | Admitting: Oncology

## 2022-08-15 ENCOUNTER — Inpatient Hospital Stay: Payer: Medicare HMO

## 2022-08-15 ENCOUNTER — Inpatient Hospital Stay (HOSPITAL_BASED_OUTPATIENT_CLINIC_OR_DEPARTMENT_OTHER): Payer: Medicare HMO | Admitting: Nurse Practitioner

## 2022-08-15 ENCOUNTER — Encounter: Payer: Self-pay | Admitting: Nurse Practitioner

## 2022-08-15 VITALS — BP 125/78 | HR 60 | Temp 98.1°F | Resp 18 | Ht 72.0 in | Wt 236.0 lb

## 2022-08-15 DIAGNOSIS — C251 Malignant neoplasm of body of pancreas: Secondary | ICD-10-CM

## 2022-08-15 DIAGNOSIS — Z5111 Encounter for antineoplastic chemotherapy: Secondary | ICD-10-CM | POA: Diagnosis not present

## 2022-08-15 LAB — CBC WITH DIFFERENTIAL (CANCER CENTER ONLY)
Abs Immature Granulocytes: 0.01 10*3/uL (ref 0.00–0.07)
Basophils Absolute: 0 10*3/uL (ref 0.0–0.1)
Basophils Relative: 1 %
Eosinophils Absolute: 0.2 10*3/uL (ref 0.0–0.5)
Eosinophils Relative: 4 %
HCT: 37.9 % — ABNORMAL LOW (ref 39.0–52.0)
Hemoglobin: 12.6 g/dL — ABNORMAL LOW (ref 13.0–17.0)
Immature Granulocytes: 0 %
Lymphocytes Relative: 30 %
Lymphs Abs: 1 10*3/uL (ref 0.7–4.0)
MCH: 28.6 pg (ref 26.0–34.0)
MCHC: 33.2 g/dL (ref 30.0–36.0)
MCV: 85.9 fL (ref 80.0–100.0)
Monocytes Absolute: 0.6 10*3/uL (ref 0.1–1.0)
Monocytes Relative: 18 %
Neutro Abs: 1.6 10*3/uL — ABNORMAL LOW (ref 1.7–7.7)
Neutrophils Relative %: 47 %
Platelet Count: 167 10*3/uL (ref 150–400)
RBC: 4.41 MIL/uL (ref 4.22–5.81)
RDW: 14.1 % (ref 11.5–15.5)
WBC Count: 3.4 10*3/uL — ABNORMAL LOW (ref 4.0–10.5)
nRBC: 0 % (ref 0.0–0.2)

## 2022-08-15 LAB — CMP (CANCER CENTER ONLY)
ALT: 44 U/L (ref 0–44)
AST: 24 U/L (ref 15–41)
Albumin: 3.9 g/dL (ref 3.5–5.0)
Alkaline Phosphatase: 63 U/L (ref 38–126)
Anion gap: 7 (ref 5–15)
BUN: 14 mg/dL (ref 8–23)
CO2: 29 mmol/L (ref 22–32)
Calcium: 9.2 mg/dL (ref 8.9–10.3)
Chloride: 104 mmol/L (ref 98–111)
Creatinine: 0.82 mg/dL (ref 0.61–1.24)
GFR, Estimated: >60 mL/min (ref >60)
Glucose, Bld: 145 mg/dL — ABNORMAL HIGH (ref 70–99)
Potassium: 4 mmol/L (ref 3.5–5.1)
Sodium: 140 mmol/L (ref 135–145)
Total Bilirubin: 0.5 mg/dL (ref 0.3–1.2)
Total Protein: 6 g/dL — ABNORMAL LOW (ref 6.5–8.1)

## 2022-08-15 NOTE — Progress Notes (Signed)
Pharmacist Chemotherapy Monitoring - Initial Assessment    Anticipated start date: 08/16/22   The following has been reviewed per standard work regarding the patient's treatment regimen: The patient's diagnosis, treatment plan and drug doses, and organ/hematologic function Lab orders and baseline tests specific to treatment regimen  The treatment plan start date, drug sequencing, and pre-medications Prior authorization status  Patient's documented medication list, including drug-drug interaction screen and prescriptions for anti-emetics and supportive care specific to the treatment regimen The drug concentrations, fluid compatibility, administration routes, and timing of the medications to be used The patient's access for treatment and lifetime cumulative dose history, if applicable  The patient's medication allergies and previous infusion related reactions, if applicable   Changes made to treatment plan:  N/A  Follow up needed:  N/A   Patrica Duel, RPH, 08/15/2022  2:08 PM

## 2022-08-15 NOTE — Progress Notes (Signed)
  [place] OFFICE PROGRESS NOTE   Diagnosis: Pancreas cancer  INTERVAL HISTORY:   Mr. Castrogiovanni returns as scheduled.  He completed cycle 1 FOLFOX 08/01/2022.  He had a single episode of nausea/vomiting which he does not feel was related to chemotherapy.  No mouth sores.  No diarrhea.  No significant cold sensitivity.  He resumed cold after about 1 week.  Abdominal pain is stable to improved.  He is taking pain medication 1-2 times a day.  Objective:  Vital signs in last 24 hours:  Blood pressure 125/78, pulse 60, temperature 98.1 F (36.7 C), temperature source Oral, resp. rate 18, height 6' (1.829 m), weight 236 lb (107 kg), SpO2 98 %.    HEENT: No thrush or ulcers. Resp: Lungs clear bilaterally. Cardio: Regular rate and rhythm. GI: Abdomen soft and nontender.  No hepatosplenomegaly. Vascular: No leg edema. Neuro: Alert and oriented. Skin: Palms without erythema. Port-A-Cath without erythema.  Lab Results:  Lab Results  Component Value Date   WBC 3.4 (L) 08/15/2022   HGB 12.6 (L) 08/15/2022   HCT 37.9 (L) 08/15/2022   MCV 85.9 08/15/2022   PLT 167 08/15/2022   NEUTROABS 1.6 (L) 08/15/2022    Imaging:  No results found.  Medications: I have reviewed the patient's current medications.  Assessment/Plan: Pancreas cancer FNA biopsy of a pancreas body/tail mass 07/05/2022-adenocarcinoma CT abdomen/pelvis 06/14/2022-hypoenhancing pancreas body/tail mass with effacement of the splenic vein, no evidence of lymphadenopathy or metastatic disease CA 19-9 on 06/18/2022-767 EUS 07/05/2022-a 7 x 29 mm pancreas body/tail mass, abutment of the splenic artery, no malignant appearing lymph nodes, T2N0 by EUS CTs 07/15/2022-pancreas body/tail mass with no evidence of metastatic disease, splenic vein associated with the tumor, no arterial involvement, stable right greater than left lung nodules favored benign Cycle 1 FOLFOX 08/01/2022 Cycle 2 FOLFIRINOX 08/15/2022 (pending  approval of white cell growth factor support) Prostate cancer, status post prostatectomy 1990 Chronic elevation of the PSA-maintained on Trelstar, followed by Dr. Jeffie Pollock   3.  Kidney stones 4.   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  Abdominal pain, likely secondary to #1 6.  Family history of prostate and pancreas cancer 7.  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  Typhlitis in 2014 9.  Colon polyps-tubular adenomas on colonoscopy 03/27/2022  Disposition: Mr. Sawyers appears stable.  He tolerated cycle 1 FOLFOX well.  CBC from today shows mild neutropenia.  Our plan is to proceed with FOLFIRINOX tomorrow if we are able to obtain insurance authorization for white cell growth factor support.  Potential toxicities reviewed with Mr. Obryan and his wife including bone pain, rash, splenic rupture.  If we are unable to obtain insurance authorization for white cell growth factor support irinotecan will not be added and oxaliplatin will be dose reduced.  Mr. and Mrs. Gallien are in agreement with this plan.  He will return for lab, follow-up, cycle 3 systemic therapy in 2 weeks.  We are available to see him sooner if needed.  Plan reviewed with Dr. [name].  [name] ANP/GNP-BC   08/15/2022  10:45 AM

## 2022-08-16 ENCOUNTER — Inpatient Hospital Stay: Payer: Medicare HMO

## 2022-08-16 ENCOUNTER — Other Ambulatory Visit: Payer: Self-pay | Admitting: Oncology

## 2022-08-16 ENCOUNTER — Other Ambulatory Visit: Payer: Self-pay | Admitting: Nurse Practitioner

## 2022-08-16 VITALS — BP 126/76 | HR 51 | Temp 96.9°F | Resp 18

## 2022-08-16 DIAGNOSIS — Z5111 Encounter for antineoplastic chemotherapy: Secondary | ICD-10-CM | POA: Diagnosis not present

## 2022-08-16 DIAGNOSIS — C251 Malignant neoplasm of body of pancreas: Secondary | ICD-10-CM

## 2022-08-16 LAB — GENETIC SCREENING ORDER

## 2022-08-16 MED ORDER — DEXTROSE 5 % IV SOLN: Freq: Once | INTRAVENOUS | Status: AC

## 2022-08-16 MED ORDER — SODIUM CHLORIDE 0.9 % IV SOLN
10.0000 mg | Freq: Once | INTRAVENOUS | Status: AC
  Administered 2022-08-16: 10 mg via INTRAVENOUS
  Filled 2022-08-16: qty 10

## 2022-08-16 MED ORDER — LEUCOVORIN CALCIUM INJECTION 350 MG
400.0000 mg/m2 | Freq: Once | INTRAVENOUS | Status: AC
  Administered 2022-08-16: 940 mg via INTRAVENOUS
  Filled 2022-08-16: qty 47

## 2022-08-16 MED ORDER — OXALIPLATIN CHEMO INJECTION 100 MG/20ML
64.0000 mg/m2 | Freq: Once | INTRAVENOUS | Status: AC
  Administered 2022-08-16: 150 mg via INTRAVENOUS
  Filled 2022-08-16: qty 20

## 2022-08-16 MED ORDER — PALONOSETRON HCL INJECTION 0.25 MG/5ML
0.2500 mg | Freq: Once | INTRAVENOUS | Status: AC
  Administered 2022-08-16: 0.25 mg via INTRAVENOUS
  Filled 2022-08-16: qty 5

## 2022-08-16 MED ORDER — SODIUM CHLORIDE 0.9 % IV SOLN
2400.0000 mg/m2 | INTRAVENOUS | Status: DC
  Administered 2022-08-16: 5650 mg via INTRAVENOUS
  Filled 2022-08-16: qty 100

## 2022-08-16 NOTE — Patient Instructions (Addendum)
[place]  The chemotherapy medication bag should finish at 46 hours, 96 hours, or 7 days. For example, if your pump is scheduled for 46 hours and it was put on at 4:00 p.m., it should finish at 2:00 p.m. the day it is scheduled to come off regardless of your appointment time.     Estimated time to finish at 12:45 Saturday , August 18, 2022. Appointment is at [facility] center at 10:30   If the display on your pump reads "Low Volume" and it is beeping, take the batteries out of the pump and come to the cancer center for it to be taken off.   If the pump alarms go off prior to the pump reading "Low Volume" then call [phone] and someone can assist you.  If the plunger comes out and the chemotherapy medication is leaking out, please use your home chemo spill kit to clean up the spill. Do NOT use paper towels or other household products.  If you have problems or questions regarding your pump, please call either [phone] (24 hours a day) or the cancer center Monday-Friday 8:00 a.m.- 4:30 p.m. at the clinic number and we will assist you. If you are unable to get assistance, then go to the nearest Emergency Department and ask the staff to contact the IV team for assistance.   Discharge Instructions: Thank you for choosing [facility] to provide your oncology and hematology care.   If you have a lab appointment with the [facility], please go directly to the [facility] and check in at the registration area.   Wear comfortable clothing and clothing appropriate for easy access to any Portacath or PICC line.   We strive to give you quality time with your provider. You may need to reschedule your appointment if you arrive late (15 or more minutes).  Arriving late affects you and other patients whose appointments are after yours.  Also, if you miss three or more appointments without notifying the office, you may be dismissed from the clinic  at the provider's discretion.      For prescription refill requests, have your pharmacy contact our office and allow 72 hours for refills to be completed.    Today you received the following chemotherapy and/or immunotherapy agents Oxaliplatin, Leucovorin, Fluorouracil      To help prevent nausea and vomiting after your treatment, we encourage you to take your nausea medication as directed.  BELOW ARE SYMPTOMS THAT SHOULD BE REPORTED IMMEDIATELY: *FEVER GREATER THAN 100.4 F (38 C) OR HIGHER *CHILLS OR SWEATING *NAUSEA AND VOMITING THAT IS NOT CONTROLLED WITH YOUR NAUSEA MEDICATION *UNUSUAL SHORTNESS OF BREATH *UNUSUAL BRUISING OR BLEEDING *URINARY PROBLEMS (pain or burning when urinating, or frequent urination) *BOWEL PROBLEMS (unusual diarrhea, constipation, pain near the anus) TENDERNESS IN MOUTH AND THROAT WITH OR WITHOUT PRESENCE OF ULCERS (sore throat, sores in mouth, or a toothache) UNUSUAL RASH, SWELLING OR PAIN  UNUSUAL VAGINAL DISCHARGE OR ITCHING   Items with * indicate a potential emergency and should be followed up as soon as possible or go to the Emergency Department if any problems should occur.  Please show the CHEMOTHERAPY ALERT CARD or IMMUNOTHERAPY ALERT CARD at check-in to the Emergency Department and triage nurse.  Should you have questions after your visit or need to cancel or reschedule your appointment, please contact [facility]  Dept: [phone]  and follow the prompts.  Office hours are 8:00 a.m.  to 4:30 p.m. Monday - Friday. Please note that voicemails left after 4:00 p.m. may not be returned until the following business day.  We are closed weekends and major holidays. You have access to a nurse at all times for urgent questions. Please call the main number to the clinic Dept: [phone] and follow the prompts.   For any non-urgent questions, you may also contact your provider using MyChart. We now offer e-Visits for anyone 35 and  older to request care online for non-urgent symptoms. For details visit [email or web address].   Also download the MyChart app! Go to the app store, search "MyChart", open the app, select [facility], and log in with your MyChart username and password.  Masks are optional in the cancer centers. If you would like for your care team to wear a mask while they are taking care of you, please let them know. You may have one support person who is at least 77 years old accompany you for your appointments.  Oxaliplatin Injection What is this medication? OXALIPLATIN (ox AL i PLA tin) treats some types of cancer. It works by slowing down the growth of cancer cells. This medicine may be used for other purposes; ask your health care provider or pharmacist if you have questions. COMMON BRAND NAME(S): Eloxatin What should I tell my care team before I take this medication? They need to know if you have any of these conditions: Heart disease History of irregular heartbeat or rhythm Liver disease Low blood cell levels (white cells, red cells, and platelets) Lung or breathing disease, such as asthma Take medications that treat or prevent blood clots Tingling of the fingers, toes, or other nerve disorder An unusual or allergic reaction to oxaliplatin, other medications, foods, dyes, or preservatives If you or your partner are pregnant or trying to get pregnant Breast-feeding How should I use this medication? This medication is injected into a vein. It is given by your care team in a hospital or clinic setting. Talk to your care team about the use of this medication in children. Special care may be needed. Overdosage: If you think you have taken too much of this medicine contact a poison control center or emergency room at once. NOTE: This medicine is only for you. Do not share this medicine with others. What if I miss a dose? Keep appointments for follow-up doses. It is important not to miss a dose. Call  your care team if you are unable to keep an appointment. What may interact with this medication? Do not take this medication with any of the following: Cisapride Dronedarone Pimozide Thioridazine This medication may also interact with the following: Aspirin and aspirin-like medications Certain medications that treat or prevent blood clots, such as warfarin, apixaban, dabigatran, and rivaroxaban Cisplatin Cyclosporine Diuretics Medications for infection, such as acyclovir, adefovir, amphotericin B, bacitracin, cidofovir, foscarnet, ganciclovir, gentamicin, pentamidine, vancomycin NSAIDs, medications for pain and inflammation, such as ibuprofen or naproxen Other medications that cause heart rhythm changes Pamidronate Zoledronic acid This list may not describe all possible interactions. Give your health care provider a list of all the medicines, herbs, non-prescription drugs, or dietary supplements you use. Also tell them if you smoke, drink alcohol, or use illegal drugs. Some items may interact with your medicine. What should I watch for while using this medication? Your condition will be monitored carefully while you are receiving this medication. You may need blood work while taking this medication. This medication may make you feel generally unwell. This is not  uncommon as chemotherapy can affect healthy cells as well as cancer cells. Report any side effects. Continue your course of treatment even though you feel ill unless your care team tells you to stop. This medication may increase your risk of getting an infection. Call your care team for advice if you get a fever, chills, sore throat, or other symptoms of a cold or flu. Do not treat yourself. Try to avoid being around people who are sick. Avoid taking medications that contain aspirin, acetaminophen, ibuprofen, naproxen, or ketoprofen unless instructed by your care team. These medications may hide a fever. Be careful brushing or  flossing your teeth or using a toothpick because you may get an infection or bleed more easily. If you have any dental work done, tell your dentist you are receiving this medication. This medication can make you more sensitive to cold. Do not drink cold drinks or use ice. Cover exposed skin before coming in contact with cold temperatures or cold objects. When out in cold weather wear warm clothing and cover your mouth and nose to warm the air that goes into your lungs. Tell your care team if you get sensitive to the cold. Talk to your care team if you or your partner are pregnant or think either of you might be pregnant. This medication can cause serious birth defects if taken during pregnancy and for 9 months after the last dose. A negative pregnancy test is required before starting this medication. A reliable form of contraception is recommended while taking this medication and for 9 months after the last dose. Talk to your care team about effective forms of contraception. Do not father a child while taking this medication and for 6 months after the last dose. Use a condom while having sex during this time period. Do not breastfeed while taking this medication and for 3 months after the last dose. This medication may cause infertility. Talk to your care team if you are concerned about your fertility. What side effects may I notice from receiving this medication? Side effects that you should report to your care team as soon as possible: Allergic reactions--skin rash, itching, hives, swelling of the face, lips, tongue, or throat Bleeding--bloody or black, tar-like stools, vomiting blood or brown material that looks like coffee grounds, red or dark brown urine, small red or purple spots on skin, unusual bruising or bleeding Dry cough, shortness of breath or trouble breathing Heart rhythm changes--fast or irregular heartbeat, dizziness, feeling faint or lightheaded, chest pain, trouble  breathing Infection--fever, chills, cough, sore throat, wounds that don't heal, pain or trouble when passing urine, general feeling of discomfort or being unwell Liver injury--right upper belly pain, loss of appetite, nausea, light-colored stool, dark yellow or brown urine, yellowing skin or eyes, unusual weakness or fatigue Low red blood cell level--unusual weakness or fatigue, dizziness, headache, trouble breathing Muscle injury--unusual weakness or fatigue, muscle pain, dark yellow or brown urine, decrease in amount of urine Pain, tingling, or numbness in the hands or feet Sudden and severe headache, confusion, change in vision, seizures, which may be signs of posterior reversible encephalopathy syndrome (PRES) Unusual bruising or bleeding Side effects that usually do not require medical attention (report to your care team if they continue or are bothersome): Diarrhea Nausea Pain, redness, or swelling with sores inside the mouth or throat Unusual weakness or fatigue Vomiting This list may not describe all possible side effects. Call your doctor for medical advice about side effects. You may report side effects  to FDA at 1-800-FDA-1088. Where should I keep my medication? This medication is given in a hospital or clinic. It will not be stored at home. NOTE: This sheet is a summary. It may not cover all possible information. If you have questions about this medicine, talk to your doctor, pharmacist, or health care provider.  2023 Elsevier/Gold Standard (2022-02-09 00:00:00)  Leucovorin Injection What is this medication? LEUCOVORIN (loo koe VOR in) prevents side effects from certain medications, such as methotrexate. It works by increasing folate levels. This helps protect healthy cells in your body. It may also be used to treat anemia caused by low levels of folate. It can also be used with fluorouracil, a type of chemotherapy, to treat colorectal cancer. It works by increasing the effects of  fluorouracil in the body. This medicine may be used for other purposes; ask your health care provider or pharmacist if you have questions. What should I tell my care team before I take this medication? They need to know if you have any of these conditions: Anemia from low levels of vitamin B12 in the blood An unusual or allergic reaction to leucovorin, folic acid, other medications, foods, dyes, or preservatives Pregnant or trying to get pregnant Breastfeeding How should I use this medication? This medication is injected into a vein or a muscle. It is given by your care team in a hospital or clinic setting. Talk to your care team about the use of this medication in children. Special care may be needed. Overdosage: If you think you have taken too much of this medicine contact a poison control center or emergency room at once. NOTE: This medicine is only for you. Do not share this medicine with others. What if I miss a dose? Keep appointments for follow-up doses. It is important not to miss your dose. Call your care team if you are unable to keep an appointment. What may interact with this medication? Capecitabine Fluorouracil Phenobarbital Phenytoin Primidone Trimethoprim;sulfamethoxazole This list may not describe all possible interactions. Give your health care provider a list of all the medicines, herbs, non-prescription drugs, or dietary supplements you use. Also tell them if you smoke, drink alcohol, or use illegal drugs. Some items may interact with your medicine. What should I watch for while using this medication? Your condition will be monitored carefully while you are receiving this medication. This medication may increase the side effects of 5-fluorouracil. Tell your care team if you have diarrhea or mouth sores that do not get better or that get worse. What side effects may I notice from receiving this medication? Side effects that you should report to your care team as soon as  possible: Allergic reactions--skin rash, itching, hives, swelling of the face, lips, tongue, or throat This list may not describe all possible side effects. Call your doctor for medical advice about side effects. You may report side effects to FDA at 1-800-FDA-1088. Where should I keep my medication? This medication is given in a hospital or clinic. It will not be stored at home. NOTE: This sheet is a summary. It may not cover all possible information. If you have questions about this medicine, talk to your doctor, pharmacist, or health care provider.  2023 Elsevier/Gold Standard (2022-02-23 00:00:00)  Fluorouracil Injection What is this medication? FLUOROURACIL (flure oh YOOR a sil) treats some types of cancer. It works by slowing down the growth of cancer cells. This medicine may be used for other purposes; ask your health care provider or pharmacist if you  have questions. COMMON BRAND NAME(S): Adrucil What should I tell my care team before I take this medication? They need to know if you have any of these conditions: Blood disorders Dihydropyrimidine dehydrogenase (DPD) deficiency Infection, such as chickenpox, cold sores, herpes Kidney disease Liver disease Poor nutrition Recent or ongoing radiation therapy An unusual or allergic reaction to fluorouracil, other medications, foods, dyes, or preservatives If you or your partner are pregnant or trying to get pregnant Breast-feeding How should I use this medication? This medication is injected into a vein. It is administered by your care team in a hospital or clinic setting. Talk to your care team about the use of this medication in children. Special care may be needed. Overdosage: If you think you have taken too much of this medicine contact a poison control center or emergency room at once. NOTE: This medicine is only for you. Do not share this medicine with others. What if I miss a dose? Keep appointments for follow-up doses. It is  important not to miss your dose. Call your care team if you are unable to keep an appointment. What may interact with this medication? Do not take this medication with any of the following: Live virus vaccines This medication may also interact with the following: Medications that treat or prevent blood clots, such as warfarin, enoxaparin, dalteparin This list may not describe all possible interactions. Give your health care provider a list of all the medicines, herbs, non-prescription drugs, or dietary supplements you use. Also tell them if you smoke, drink alcohol, or use illegal drugs. Some items may interact with your medicine. What should I watch for while using this medication? Your condition will be monitored carefully while you are receiving this medication. This medication may make you feel generally unwell. This is not uncommon as chemotherapy can affect healthy cells as well as cancer cells. Report any side effects. Continue your course of treatment even though you feel ill unless your care team tells you to stop. In some cases, you may be given additional medications to help with side effects. Follow all directions for their use. This medication may increase your risk of getting an infection. Call your care team for advice if you get a fever, chills, sore throat, or other symptoms of a cold or flu. Do not treat yourself. Try to avoid being around people who are sick. This medication may increase your risk to bruise or bleed. Call your care team if you notice any unusual bleeding. Be careful brushing or flossing your teeth or using a toothpick because you may get an infection or bleed more easily. If you have any dental work done, tell your dentist you are receiving this medication. Avoid taking medications that contain aspirin, acetaminophen, ibuprofen, naproxen, or ketoprofen unless instructed by your care team. These medications may hide a fever. Do not treat diarrhea with over the  counter products. Contact your care team if you have diarrhea that lasts more than 2 days or if it is severe and watery. This medication can make you more sensitive to the sun. Keep out of the sun. If you cannot avoid being in the sun, wear protective clothing and sunscreen. Do not use sun lamps, tanning beds, or tanning booths. Talk to your care team if you or your partner wish to become pregnant or think you might be pregnant. This medication can cause serious birth defects if taken during pregnancy and for 3 months after the last dose. A reliable form of contraception is  recommended while taking this medication and for 3 months after the last dose. Talk to your care team about effective forms of contraception. Do not father a child while taking this medication and for 3 months after the last dose. Use a condom while having sex during this time period. Do not breastfeed while taking this medication. This medication may cause infertility. Talk to your care team if you are concerned about your fertility. What side effects may I notice from receiving this medication? Side effects that you should report to your care team as soon as possible: Allergic reactions--skin rash, itching, hives, swelling of the face, lips, tongue, or throat Heart attack--pain or tightness in the chest, shoulders, arms, or jaw, nausea, shortness of breath, cold or clammy skin, feeling faint or lightheaded Heart failure--shortness of breath, swelling of the ankles, feet, or hands, sudden weight gain, unusual weakness or fatigue Heart rhythm changes--fast or irregular heartbeat, dizziness, feeling faint or lightheaded, chest pain, trouble breathing High ammonia level--unusual weakness or fatigue, confusion, loss of appetite, nausea, vomiting, seizures Infection--fever, chills, cough, sore throat, wounds that don't heal, pain or trouble when passing urine, general feeling of discomfort or being unwell Low red blood cell  level--unusual weakness or fatigue, dizziness, headache, trouble breathing Pain, tingling, or numbness in the hands or feet, muscle weakness, change in vision, confusion or trouble speaking, loss of balance or coordination, trouble walking, seizures Redness, swelling, and blistering of the skin over hands and feet Severe or prolonged diarrhea Unusual bruising or bleeding Side effects that usually do not require medical attention (report to your care team if they continue or are bothersome): Dry skin Headache Increased tears Nausea Pain, redness, or swelling with sores inside the mouth or throat Sensitivity to light Vomiting This list may not describe all possible side effects. Call your doctor for medical advice about side effects. You may report side effects to FDA at 1-800-FDA-1088. Where should I keep my medication? This medication is given in a hospital or clinic. It will not be stored at home. NOTE: This sheet is a summary. It may not cover all possible information. If you have questions about this medicine, talk to your doctor, pharmacist, or health care provider.  2023 Elsevier/Gold Standard (2022-02-20 00:00:00)

## 2022-08-17 ENCOUNTER — Encounter (HOSPITAL_COMMUNITY): Payer: Self-pay | Admitting: Gastroenterology

## 2022-08-17 ENCOUNTER — Telehealth: Payer: Self-pay | Admitting: Genetic Counselor

## 2022-08-17 ENCOUNTER — Encounter: Payer: Self-pay | Admitting: Nurse Practitioner

## 2022-08-17 NOTE — Telephone Encounter (Signed)
R/s pt's genetics appt. Pt is aware of new appt date/time.

## 2022-08-18 ENCOUNTER — Inpatient Hospital Stay: Payer: Medicare HMO

## 2022-08-18 VITALS — BP 114/71 | HR 62 | Temp 97.2°F | Resp 20

## 2022-08-18 DIAGNOSIS — C251 Malignant neoplasm of body of pancreas: Secondary | ICD-10-CM

## 2022-08-18 DIAGNOSIS — Z5111 Encounter for antineoplastic chemotherapy: Secondary | ICD-10-CM | POA: Diagnosis not present

## 2022-08-18 MED ORDER — SODIUM CHLORIDE 0.9% FLUSH
10.0000 mL | INTRAVENOUS | Status: DC | PRN
  Administered 2022-08-18: 10 mL

## 2022-08-18 MED ORDER — PEGFILGRASTIM INJECTION 6 MG/0.6ML ~~LOC~~
6.0000 mg | PREFILLED_SYRINGE | Freq: Once | SUBCUTANEOUS | Status: AC
  Administered 2022-08-18: 6 mg via SUBCUTANEOUS

## 2022-08-18 MED ORDER — HEPARIN SOD (PORK) LOCK FLUSH 100 UNIT/ML IV SOLN
500.0000 [IU] | Freq: Once | INTRAVENOUS | Status: AC | PRN
  Administered 2022-08-18: 500 [IU]

## 2022-08-18 NOTE — Patient Instructions (Signed)

## 2022-08-23 ENCOUNTER — Encounter (HOSPITAL_COMMUNITY): Payer: Self-pay | Admitting: Gastroenterology

## 2022-08-23 ENCOUNTER — Ambulatory Visit (HOSPITAL_COMMUNITY): Payer: Medicare HMO

## 2022-08-23 ENCOUNTER — Ambulatory Visit (HOSPITAL_COMMUNITY)
Admission: RE | Admit: 2022-08-23 | Discharge: 2022-08-23 | Disposition: A | Discharge status: Home / Self Care | Payer: Medicare HMO | Attending: Gastroenterology | Admitting: Gastroenterology

## 2022-08-23 ENCOUNTER — Ambulatory Visit (HOSPITAL_COMMUNITY): Payer: Medicare HMO | Admitting: Certified Registered Nurse Anesthetist

## 2022-08-23 ENCOUNTER — Other Ambulatory Visit: Payer: Self-pay

## 2022-08-23 ENCOUNTER — Encounter (HOSPITAL_COMMUNITY)
Admission: RE | Disposition: A | Discharge status: Home / Self Care | Payer: Self-pay | Source: Home / Self Care | Attending: Gastroenterology

## 2022-08-23 ENCOUNTER — Ambulatory Visit (HOSPITAL_BASED_OUTPATIENT_CLINIC_OR_DEPARTMENT_OTHER): Payer: Medicare HMO | Admitting: Certified Registered Nurse Anesthetist

## 2022-08-23 DIAGNOSIS — Z9049 Acquired absence of other specified parts of digestive tract: Secondary | ICD-10-CM | POA: Insufficient documentation

## 2022-08-23 DIAGNOSIS — Z6831 Body mass index (BMI) 31.0-31.9, adult: Secondary | ICD-10-CM | POA: Diagnosis not present

## 2022-08-23 DIAGNOSIS — K219 Gastro-esophageal reflux disease without esophagitis: Secondary | ICD-10-CM | POA: Insufficient documentation

## 2022-08-23 DIAGNOSIS — K8050 Calculus of bile duct without cholangitis or cholecystitis without obstruction: Secondary | ICD-10-CM | POA: Diagnosis present

## 2022-08-23 DIAGNOSIS — Z8507 Personal history of malignant neoplasm of pancreas: Secondary | ICD-10-CM | POA: Insufficient documentation

## 2022-08-23 DIAGNOSIS — C259 Malignant neoplasm of pancreas, unspecified: Secondary | ICD-10-CM | POA: Diagnosis not present

## 2022-08-23 DIAGNOSIS — E669 Obesity, unspecified: Secondary | ICD-10-CM | POA: Insufficient documentation

## 2022-08-23 DIAGNOSIS — M1990 Unspecified osteoarthritis, unspecified site: Secondary | ICD-10-CM

## 2022-08-23 DIAGNOSIS — N189 Chronic kidney disease, unspecified: Secondary | ICD-10-CM | POA: Insufficient documentation

## 2022-08-23 DIAGNOSIS — N289 Disorder of kidney and ureter, unspecified: Secondary | ICD-10-CM | POA: Diagnosis not present

## 2022-08-23 DIAGNOSIS — K449 Diaphragmatic hernia without obstruction or gangrene: Secondary | ICD-10-CM | POA: Diagnosis not present

## 2022-08-23 DIAGNOSIS — Z87891 Personal history of nicotine dependence: Secondary | ICD-10-CM | POA: Insufficient documentation

## 2022-08-23 DIAGNOSIS — K8689 Other specified diseases of pancreas: Secondary | ICD-10-CM

## 2022-08-23 DIAGNOSIS — Z8546 Personal history of malignant neoplasm of prostate: Secondary | ICD-10-CM | POA: Insufficient documentation

## 2022-08-23 HISTORY — PX: REMOVAL OF STONES: SHX5545

## 2022-08-23 HISTORY — PX: ENDOSCOPIC RETROGRADE CHOLANGIOPANCREATOGRAPHY (ERCP) WITH PROPOFOL: SHX5810

## 2022-08-23 HISTORY — PX: SPHINCTEROTOMY: SHX5279

## 2022-08-23 LAB — CBC
HCT: 37.3 % — ABNORMAL LOW (ref 39.0–52.0)
Hemoglobin: 12.5 g/dL — ABNORMAL LOW (ref 13.0–17.0)
MCH: 28.9 pg (ref 26.0–34.0)
MCHC: 33.5 g/dL (ref 30.0–36.0)
MCV: 86.1 fL (ref 80.0–100.0)
Platelets: 173 10*3/uL (ref 150–400)
RBC: 4.33 MIL/uL (ref 4.22–5.81)
RDW: 14.2 % (ref 11.5–15.5)
WBC: 21.7 10*3/uL — ABNORMAL HIGH (ref 4.0–10.5)
nRBC: 0 % (ref 0.0–0.2)

## 2022-08-23 SURGERY — ENDOSCOPIC RETROGRADE CHOLANGIOPANCREATOGRAPHY (ERCP) WITH PROPOFOL
Anesthesia: General

## 2022-08-23 MED ORDER — PHENYLEPHRINE HCL (PRESSORS) 10 MG/ML IV SOLN
INTRAVENOUS | Status: AC
  Filled 2022-08-23: qty 1

## 2022-08-23 MED ORDER — INDOMETHACIN 50 MG RE SUPP
RECTAL | Status: DC | PRN
  Administered 2022-08-23: 100 mg via RECTAL

## 2022-08-23 MED ORDER — EPHEDRINE SULFATE-NACL 50-0.9 MG/10ML-% IV SOSY
PREFILLED_SYRINGE | INTRAVENOUS | Status: DC | PRN
  Administered 2022-08-23: 10 mg via INTRAVENOUS

## 2022-08-23 MED ORDER — SUGAMMADEX SODIUM 500 MG/5ML IV SOLN
INTRAVENOUS | Status: DC | PRN
  Administered 2022-08-23: 400 mg via INTRAVENOUS

## 2022-08-23 MED ORDER — LIDOCAINE 2% (20 MG/ML) 5 ML SYRINGE
INTRAMUSCULAR | Status: DC | PRN
  Administered 2022-08-23: 100 mg via INTRAVENOUS

## 2022-08-23 MED ORDER — PHENYLEPHRINE 80 MCG/ML (10ML) SYRINGE FOR IV PUSH (FOR BLOOD PRESSURE SUPPORT)
PREFILLED_SYRINGE | INTRAVENOUS | Status: DC | PRN
  Administered 2022-08-23 (×3): 160 ug via INTRAVENOUS

## 2022-08-23 MED ORDER — INDOMETHACIN 50 MG RE SUPP
RECTAL | Status: AC
  Filled 2022-08-23: qty 1

## 2022-08-23 MED ORDER — LACTATED RINGERS IV SOLN: INTRAVENOUS | Status: DC

## 2022-08-23 MED ORDER — FENTANYL CITRATE (PF) 250 MCG/5ML IJ SOLN
INTRAMUSCULAR | Status: DC | PRN
  Administered 2022-08-23: 100 ug via INTRAVENOUS

## 2022-08-23 MED ORDER — FENTANYL CITRATE (PF) 250 MCG/5ML IJ SOLN
INTRAMUSCULAR | Status: AC
  Filled 2022-08-23: qty 5

## 2022-08-23 MED ORDER — ONDANSETRON HCL 4 MG/2ML IJ SOLN
INTRAMUSCULAR | Status: DC | PRN
  Administered 2022-08-23: 4 mg via INTRAVENOUS

## 2022-08-23 MED ORDER — GLUCAGON HCL RDNA (DIAGNOSTIC) 1 MG IJ SOLR
INTRAMUSCULAR | Status: DC | PRN
  Administered 2022-08-23 (×2): .25 mg via INTRAVENOUS

## 2022-08-23 MED ORDER — CIPROFLOXACIN IN D5W 400 MG/200ML IV SOLN
400.0000 mg | Freq: Once | INTRAVENOUS | Status: AC
  Administered 2022-08-23: 400 mg via INTRAVENOUS

## 2022-08-23 MED ORDER — PHENYLEPHRINE HCL-NACL 20-0.9 MG/250ML-% IV SOLN
INTRAVENOUS | Status: DC | PRN
  Administered 2022-08-23: 100 ug/min via INTRAVENOUS

## 2022-08-23 MED ORDER — CIPROFLOXACIN IN D5W 400 MG/200ML IV SOLN
INTRAVENOUS | Status: AC
  Filled 2022-08-23: qty 200

## 2022-08-23 MED ORDER — SODIUM CHLORIDE 0.9 % IV SOLN: INTRAVENOUS | Status: DC

## 2022-08-23 MED ORDER — ROCURONIUM BROMIDE 10 MG/ML (PF) SYRINGE
PREFILLED_SYRINGE | INTRAVENOUS | Status: DC | PRN
  Administered 2022-08-23: 60 mg via INTRAVENOUS

## 2022-08-23 MED ORDER — GLUCAGON HCL RDNA (DIAGNOSTIC) 1 MG IJ SOLR
INTRAMUSCULAR | Status: AC
  Filled 2022-08-23: qty 1

## 2022-08-23 MED ORDER — PROPOFOL 10 MG/ML IV BOLUS
INTRAVENOUS | Status: DC | PRN
  Administered 2022-08-23: 170 mg via INTRAVENOUS

## 2022-08-23 MED ORDER — SODIUM CHLORIDE 0.9 % IV SOLN
INTRAVENOUS | Status: DC | PRN
  Administered 2022-08-23: 30 mL

## 2022-08-23 NOTE — Transfer of Care (Signed)
Immediate Anesthesia Transfer of Care Note  Patient: Jared [name]  Procedure(s) Performed: ENDOSCOPIC RETROGRADE CHOLANGIOPANCREATOGRAPHY (ERCP) WITH PROPOFOL SPHINCTEROTOMY REMOVAL OF STONES  Patient Location: PACU and Endoscopy Unit  Anesthesia Type:General  Level of Consciousness: awake, drowsy and responds to stimulation  Airway & Oxygen Therapy: Patient Spontanous Breathing and Patient connected to face mask oxygen  Post-op Assessment: Report given to RN and Post -op Vital signs reviewed and stable  Post vital signs: Reviewed and stable  Last Vitals:  Vitals Value Taken Time  BP 114/70 08/23/22 1540  Temp 36.8 C 08/23/22 1538  Pulse 60 08/23/22 1541  Resp 20 08/23/22 1541  SpO2 100 % 08/23/22 1541  Vitals shown include unvalidated device data.  Last Pain:  Vitals:   08/23/22 1538  TempSrc: Tympanic  PainSc: Asleep         Complications: No notable events documented.

## 2022-08-23 NOTE — Op Note (Signed)
[facility] Patient Name: Jared [name] Procedure Date: 08/23/2022 MRN: [id number] Attending MD: Justice Britain , MD, [id number] Date of Birth: 24-Dec-1945 CSN: [id number] Age: 76 Admit Type: Outpatient Procedure:                ERCP Indications:              Bile duct stone(s) Providers:                Justice Britain, MD, Doristine Johns, RN, Cletis Athens, Technician, Brien Mates, RNFA Referring MD:             Gatha Mayer, MD, Izola Price. Sherrill Medicines:                General Anesthesia, Cipro 400 mg IV, Indomethacin                            100 mg PR (given after WBC noted to be greater than                            1000), Glucagon 0.5 mg IV Complications:            No immediate complications. Estimated Blood Loss:     Estimated blood loss was minimal. Procedure:                Pre-Anesthesia Assessment:                           - Prior to the procedure, a History and Physical                            was performed, and patient medications and                            allergies were reviewed. The patient's tolerance of                            previous anesthesia was also reviewed. The risks                            and benefits of the procedure and the sedation                            options and risks were discussed with the patient.                            All questions were answered, and informed consent                            was obtained. Prior Anticoagulants: The patient has                            taken no anticoagulant or antiplatelet agents  except for NSAID medication. ASA Grade Assessment:                            III - A patient with severe systemic disease. After                            reviewing the risks and benefits, the patient was                            deemed in satisfactory condition to undergo the                            procedure.                            After obtaining informed consent, the scope was                            passed under direct vision. Throughout the                            procedure, the patient's blood pressure, pulse, and                            oxygen saturations were monitored continuously. The                            [employer] D single use                            duodenoscope was introduced through the mouth, and                            used to inject contrast into and used to inject                            contrast into the bile duct. The ERCP was                            accomplished without difficulty. The patient                            tolerated the procedure. Scope In: Scope Out: Findings:      A scout film of the abdomen was obtained. Surgical clips, consistent       with a previous cholecystectomy, were seen in the area of the right       upper quadrant of the abdomen.      The upper GI tract was traversed under direct vision without detailed       examination. A 3 cm hiatal hernia was present. The major papilla was       bulging.      A short 0.035 inch Soft Jagwire was passed into the biliary tree. The       Hydratome sphincterotome was passed over the guidewire and the bile  duct       was then deeply cannulated. Contrast was injected. I personally       interpreted the bile duct images. Ductal flow of contrast was adequate.       Image quality was adequate. Contrast extended to the hepatic ducts.       Opacification of the entire biliary tree except for the gallbladder was       successful. The maximum diameter of the ducts was 10 mm. The lower third       of the main bile duct and middle third of the main bile duct contained       filling defects thought to be stones. A 9 mm biliary sphincterotomy was       made with a monofilament Hydratome sphincterotome using ERBE       electrocautery. There was self limited oozing from the  sphincterotomy       which did not require treatment. The biliary tree was swept with a       retrieval balloon. Sludge was swept from the duct. Two stones were       removed. No stones remained. An occlusion cholangiogram was performed       that showed no further significant biliary pathology. A 9 mm biliary       sphincterotomy was made with a monofilament Hydratome sphincterotome       using ERBE electrocautery. There was self limited oozing from the       sphincterotomy which did not require treatment. The biliary tree was       swept with a retrieval balloon. Sludge was swept from the duct. Two       stones were removed. No stones remained. An occlusion cholangiogram was       performed that showed no further significant biliary pathology.      A pancreatogram was not performed.      The duodenoscope was withdrawn from the patient. Impression:               - 3 cm hiatal hernia.                           - The major papilla appeared to be bulging.                           - A filling defect consistent with a stone and                            sludge was seen on the cholangiogram.                           - Choledocholithiasis was found. Complete removal                            was accomplished by biliary sphincterotomy and                            balloon sweep.. Moderate Sedation:      Not Applicable - Patient had care per Anesthesia. Recommendation:           - The patient will be observed post-procedure,  until all discharge criteria are met.                           - Discharge patient to home.                           - Patient has a contact number available for                            emergencies. The signs and symptoms of potential                            delayed complications were discussed with the                            patient. Return to normal activities tomorrow.                            Written discharge instructions  were provided to the                            patient.                           - Low fat diet.                           - Watch for pancreatitis, bleeding, perforation,                            and cholangitis.                           - Observe patient's clinical course.                           - Check liver enzymes (AST, ALT, alkaline                            phosphatase, bilirubin) in 2 weeks.                           - The findings and recommendations were discussed                            with the patient.                           - The findings and recommendations were discussed                            with the designated responsible adult. Procedure Code(s):        --- Professional ---                           [id number], Endoscopic retrograde  cholangiopancreatography (ERCP); with removal of                            calculi/debris from biliary/pancreatic duct(s)                           [id number], Endoscopic retrograde                            cholangiopancreatography (ERCP); with                            sphincterotomy/papillotomy Diagnosis Code(s):        --- Professional ---                           K44.9, Diaphragmatic hernia without obstruction or                            gangrene                           K80.50, Calculus of bile duct without cholangitis                            or cholecystitis without obstruction                           K83.8, Other specified diseases of biliary tract                           R93.2, Abnormal findings on diagnostic imaging of                            liver and biliary tract CPT copyright 2022 American Medical Association. All rights reserved. The codes documented in this report are preliminary and upon coder review may  be revised to meet current compliance requirements. Justice Britain, MD 08/23/2022 3:44:59 PM Number of Addenda: 0

## 2022-08-23 NOTE — Anesthesia Preprocedure Evaluation (Signed)
Anesthesia Evaluation  Patient identified by MRN, date of birth, ID band Patient awake    Reviewed: Allergy & Precautions, NPO status , Patient's Chart, lab work & pertinent test results  Airway Mallampati: II  TM Distance: >3 FB Neck ROM: Full    Dental  (+) Teeth Intact, Dental Advisory Given, Caps,    Pulmonary former smoker,    Pulmonary exam normal breath sounds clear to auscultation       Cardiovascular negative cardio ROS Normal cardiovascular exam Rhythm:Regular Rate:Normal     Neuro/Psych negative neurological ROS  negative psych ROS   GI/Hepatic Neg liver ROS, GERD  ,Pancreatic cancer    Endo/Other  Obesity   Renal/GU Renal InsufficiencyRenal disease   Prostate cancer     Musculoskeletal  (+) Arthritis , Osteoarthritis,    Abdominal   Peds  Hematology negative hematology ROS (+)   Anesthesia Other Findings Day of surgery medications reviewed with the patient.  Pancreatic Cancer  Reproductive/Obstetrics                             Anesthesia Physical  Anesthesia Plan  ASA: 4  Anesthesia Plan: General   Post-op Pain Management: Minimal or no pain anticipated   Induction: Intravenous  PONV Risk Score and Plan: 2 and Midazolam, Ondansetron and Treatment may vary due to age or medical condition  Airway Management Planned: Oral ETT  Additional Equipment:   Intra-op Plan:   Post-operative Plan: Extubation in OR  Informed Consent: I have reviewed the patients History and Physical, chart, labs and discussed the procedure including the risks, benefits and alternatives for the proposed anesthesia with the patient or authorized representative who has indicated his/her understanding and acceptance.     Dental advisory given  Plan Discussed with: CRNA  Anesthesia Plan Comments:         Anesthesia Quick Evaluation

## 2022-08-23 NOTE — H&P (Signed)
GASTROENTEROLOGY PROCEDURE H&P NOTE   Primary Care Physician: [name], MD  HPI: Jared [name] is a 75 y.o. male who presents for ERCP for attempt at choledocholithiasis removal.  Past Medical History:  Diagnosis Date   Arthritis    SHOULDER & KNEES   Cataract    bilateral sx   Chronic kidney disease    pt states he does not have kidney disease, just a hx of kidney stones   History of kidney stones    Inguinal hernia    right   Pancreatic cancer ([place]) 2023   Prostate cancer ([place]) 10/29/1988   Past Surgical History:  Procedure Laterality Date   CATARACT EXTRACTION Bilateral    CHOLECYSTECTOMY N/A 07/14/2013   Procedure: LAPAROSCOPIC CHOLECYSTECTOMY;  Surgeon: Stark Klein, MD;  Location: [facility];  Service: General;  Laterality: N/A;   COLONOSCOPY  01/07/2004   Dr. Silvano Rusk   COLONOSCOPY  02/2022   CG-MAC-mira(good)-tics-2 polyps   ESOPHAGOGASTRODUODENOSCOPY (EGD) WITH PROPOFOL N/A 07/05/2022   Procedure: ESOPHAGOGASTRODUODENOSCOPY (EGD) WITH PROPOFOL;  Surgeon: Irving Copas., MD;  Location: [place];  Service: Gastroenterology;  Laterality: N/A;   EUS N/A 07/05/2022   Procedure: UPPER ENDOSCOPIC ULTRASOUND (EUS) RADIAL;  Surgeon: Irving Copas., MD;  Location: [place];  Service: Gastroenterology;  Laterality: N/A;   EYE SURGERY     FINE NEEDLE ASPIRATION  07/05/2022   Procedure: FINE NEEDLE ASPIRATION (FNA) LINEAR;  Surgeon: Irving Copas., MD;  Location: [place];  Service: Gastroenterology;;   INGUINAL HERNIA REPAIR Right    PORTACATH PLACEMENT Left 07/31/2022   Procedure: INSERTION PORT-A-CATH;  Surgeon: Stark Klein, MD;  Location: [place];  Service: General;  Laterality: Left;   PROSTATECTOMY     TONSILLECTOMY     Current Facility-Administered Medications  Medication Dose Route Frequency Provider Last Rate Last Admin   0.9 %  sodium chloride infusion   Intravenous Continuous Mansouraty, Telford Nab., MD        lactated ringers infusion   Intravenous Continuous Mansouraty, Telford Nab., MD 10 mL/hr at 08/23/22 1357 New Bag at 08/23/22 1357    Current Facility-Administered Medications:    0.9 %  sodium chloride infusion, , Intravenous, Continuous, Mansouraty, Telford Nab., MD   lactated ringers infusion, , Intravenous, Continuous, Mansouraty, Telford Nab., MD, Last Rate: 10 mL/hr at 08/23/22 1357, New Bag at 08/23/22 1357 Allergies  Allergen Reactions   Claritin [Loratadine] Other (See Comments)    Increases pressure in eyes   Milk-Related Compounds Other (See Comments)    INDIGESTION. GI upset   Lupron [Leuprolide] Itching and Other (See Comments)    Caused infection on hip    Family History  Problem Relation Age of Onset   Pancreatic cancer Mother    Kidney cancer Mother    Prostate cancer Father    Prostate cancer Brother    Prostate cancer Paternal Uncle    Prostate cancer Paternal Uncle    Prostate cancer Paternal Uncle    Prostate cancer Paternal Uncle    Prostate cancer Paternal Grandfather    Colon cancer Neg Hx    Stomach cancer Neg Hx    Esophageal cancer Neg Hx    Colon polyps Neg Hx    Rectal cancer Neg Hx    Social History   Socioeconomic History   Marital status: Married    Spouse name: Not on file   Number of children: 1   Years of education: Not on file   Highest education level: Not  on file  Occupational History   Occupation: retired  Tobacco Use   Smoking status: Former    Packs/day: 0.50    Years: 20.00    Total pack years: 10.00    Types: Cigarettes    Quit date: 10/29/1981    Years since quitting: 40.8   Smokeless tobacco: Never  Vaping Use   Vaping Use: Never used  Substance and Sexual Activity   Alcohol use: No   Drug use: No   Sexual activity: Yes  Other Topics Concern   Not on file  Social History Narrative   Married to [employer]. Retired. 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             Social Determinants of Health   Financial Resource Strain: Low Risk  (07/26/2022)   Overall Financial Resource Strain (CARDIA)    Difficulty of Paying Living Expenses: Not hard at all  Food Insecurity: No Food Insecurity (07/26/2022)   Hunger Vital Sign    Worried About Running Out of Food in the Last Year: Never true    Ran Out of Food in the Last Year: Never true  Transportation Needs: No Transportation Needs (07/26/2022)   PRAPARE - Hydrologist (Medical): No    Lack of Transportation (Non-Medical): No  Physical Activity: Not on file  Stress: Not on file  Social Connections: Socially Integrated (07/26/2022)   Social Connection and Isolation Panel [NHANES]    Frequency of Communication with Friends and Family: More than three times a week    Frequency of Social Gatherings with Friends and Family: More than three times a week    Attends Religious Services: More than 4 times per year    Active Member of [employer] or Organizations: Yes    Attends Archivist Meetings: More than 4 times per year    Marital Status: Married  Human resources officer Violence: Not At Risk (07/26/2022)   Humiliation, Afraid, Rape, and Kick questionnaire    Fear of Current or Ex-Partner: No    Emotionally Abused: No    Physically Abused: No    Sexually Abused: No    Physical Exam: Today's Vitals   08/23/22 1351  BP: (!) 147/107  Pulse: 82  Resp: 20  Temp: 98.4 F (36.9 C)  TempSrc: Oral  SpO2: 100%  Weight: 103.9 kg  Height: 6' (1.829 m)  PainSc: 0-No pain   Body mass index is 31.06 kg/m. GEN: NAD EYE: Sclerae anicteric ENT: MMM CV: Non-tachycardic GI: Soft, NT/ND NEURO:  Alert & Oriented x 3  Lab Results: No results for input(s): "WBC", "HGB", "HCT", "PLT" in the last 72 hours. BMET No results for input(s): "NA", "K", "CL", "CO2", "GLUCOSE", "BUN", "CREATININE", "CALCIUM" in the last 72 hours. LFT No results for input(s): "PROT", "ALBUMIN", "AST",  "ALT", "ALKPHOS", "BILITOT", "BILIDIR", "IBILI" in the last 72 hours. PT/INR No results for input(s): "LABPROT", "INR" in the last 72 hours.   Impression / Plan: This is a 76 y.o.male who presents for ERCP for attempt at choledocholithiasis removal.  The risks of an ERCP were discussed at length, including but not limited to the risk of perforation, bleeding, abdominal pain, post-ERCP pancreatitis (while usually mild can be severe and even life threatening).   The risks and benefits of endoscopic evaluation/treatment were discussed with the patient and/or family; these include but are not limited to the risk of perforation, infection, bleeding, missed  lesions, lack of diagnosis, severe illness requiring hospitalization, as well as anesthesia and sedation related illnesses.  The patient's history has been reviewed, patient examined, no change in status, and deemed stable for procedure.  The patient and/or family is agreeable to proceed.    Justice Britain, MD [facility] Office # [phone]

## 2022-08-23 NOTE — Discharge Instructions (Signed)
YOU HAD AN ENDOSCOPIC PROCEDURE TODAY: Refer to the procedure report and other information in the discharge instructions given to you for any specific questions about what was found during the examination. If this information does not answer your questions, please call [place] office at [phone] to clarify.  ° °YOU SHOULD EXPECT: Some feelings of bloating in the abdomen. Passage of more gas than usual. Walking can help get rid of the air that was put into your GI tract during the procedure and reduce the bloating. If you had a lower endoscopy (such as a colonoscopy or flexible sigmoidoscopy) you may notice spotting of blood in your stool or on the toilet paper. Some abdominal soreness may be present for a day or two, also. ° °DIET: Your first meal following the procedure should be a light meal and then it is ok to progress to your normal diet. A half-sandwich or bowl of soup is an example of a good first meal. Heavy or fried foods are harder to digest and may make you feel nauseous or bloated. Drink plenty of fluids but you should avoid alcoholic beverages for 24 hours. If you had a esophageal dilation, please see attached instructions for diet.   ° °ACTIVITY: Your care partner should take you home directly after the procedure. You should plan to take it easy, moving slowly for the rest of the day. You can resume normal activity the day after the procedure however YOU SHOULD NOT DRIVE, use power tools, machinery or perform tasks that involve climbing or major physical exertion for 24 hours (because of the sedation medicines used during the test).  ° °SYMPTOMS TO REPORT IMMEDIATELY: °A gastroenterologist can be reached at any hour. Please call [phone]  for any of the following symptoms:  °Following lower endoscopy (colonoscopy, flexible sigmoidoscopy) °Excessive amounts of blood in the stool  °Significant tenderness, worsening of abdominal pains  °Swelling of the abdomen that is new, acute  °Fever of 100° or  higher  °Following upper endoscopy (EGD, EUS, ERCP, esophageal dilation) °Vomiting of blood or coffee ground material  °New, significant abdominal pain  °New, significant chest pain or pain under the shoulder blades  °Painful or persistently difficult swallowing  °New shortness of breath  °Black, tarry-looking or red, bloody stools ° °FOLLOW UP:  °If any biopsies were taken you will be contacted by phone or by letter within the next 1-3 weeks. Call [phone]  if you have not heard about the biopsies in 3 weeks.  °Please also call with any specific questions about appointments or follow up tests. ° °

## 2022-08-23 NOTE — Anesthesia Procedure Notes (Signed)
Procedure Name: Intubation Date/Time: 08/23/2022 2:50 PM  Performed by: Lollie Sails, CRNAPre-anesthesia Checklist: Patient identified, Emergency Drugs available, Suction available, Patient being monitored and Timeout performed Patient Re-evaluated:Patient Re-evaluated prior to induction Oxygen Delivery Method: Circle system utilized Preoxygenation: Pre-oxygenation with 100% oxygen Induction Type: IV induction Ventilation: Mask ventilation without difficulty Laryngoscope Size: Miller and 3 Grade View: Grade I Tube type: Oral Tube size: 7.5 mm Number of attempts: 1 Airway Equipment and Method: Stylet Placement Confirmation: ETT inserted through vocal cords under direct vision, positive ETCO2 and breath sounds checked- equal and bilateral Secured at: 23 cm Tube secured with: Tape Dental Injury: Teeth and Oropharynx as per pre-operative assessment

## 2022-08-25 NOTE — Anesthesia Postprocedure Evaluation (Signed)
Anesthesia Post Note  Patient: Jared [name]  Procedure(s) Performed: ENDOSCOPIC RETROGRADE CHOLANGIOPANCREATOGRAPHY (ERCP) WITH PROPOFOL SPHINCTEROTOMY REMOVAL OF STONES     Patient location during evaluation: PACU Anesthesia Type: General Level of consciousness: awake and alert Pain management: pain level controlled Vital Signs Assessment: post-procedure vital signs reviewed and stable Respiratory status: spontaneous breathing, nonlabored ventilation and respiratory function stable Cardiovascular status: blood pressure returned to baseline Postop Assessment: no apparent nausea or vomiting Anesthetic complications: no   No notable events documented.  Last Vitals:  Vitals:   08/23/22 1548 08/23/22 1558  BP: 105/72 120/74  Pulse: 68 66  Resp: 18 20  Temp:    SpO2: 98% 96%    Last Pain:  Vitals:   08/23/22 1558  TempSrc:   PainSc: 0-No pain   Pain Goal:                   Marthenia Rolling

## 2022-08-26 ENCOUNTER — Other Ambulatory Visit: Payer: Self-pay | Admitting: Oncology

## 2022-08-26 ENCOUNTER — Encounter (HOSPITAL_COMMUNITY): Payer: Self-pay | Admitting: Gastroenterology

## 2022-08-26 ENCOUNTER — Telehealth: Payer: Self-pay | Admitting: Physician Assistant

## 2022-08-26 NOTE — Telephone Encounter (Signed)
Patient called this morning concerned because he has been having intermittent small-volume nosebleed from the left nostril over the past couple of days since he had the procedure on 08/23/2022 Last night he had a little bit larger amount but today since he has used TheraFlu because he also feels congested etc. he has not had any more bleeding  He was asking whether or not his left nostril was instrumented during his procedure which it may have been for anesthesia  Feels fine currently is not on any blood thinners,  Advised that if he has further bleeding that he is unable to get stop that he should go to either [facility] or [place] emergency room.  He says he wanted Dr. Rush Landmark to know that he was "an angel from heaven" and that his abdominal pain has completely resolved, and he knows you are not supposed to have doctors but if he were to see you again he would have you

## 2022-08-28 NOTE — Telephone Encounter (Signed)
AE, Thank you for letting me know this. Nothing was instrumented from our standpoint so likely may be irritation from his nasal cannula or other issues. I will have [name] reach out to the patient this week to see how he is doing. Thankfully I am happy to hear about his improvement in his normal symptoms. GM  [name], Please reach out to the patient and see how he is doing. Thanks. GM

## 2022-08-28 NOTE — Telephone Encounter (Signed)
Left message on machine to call back  

## 2022-08-29 ENCOUNTER — Inpatient Hospital Stay: Payer: Medicare HMO

## 2022-08-29 ENCOUNTER — Encounter: Payer: Self-pay | Admitting: *Deleted

## 2022-08-29 ENCOUNTER — Inpatient Hospital Stay: Payer: Medicare HMO | Attending: Oncology | Admitting: Oncology

## 2022-08-29 VITALS — BP 113/79 | HR 58 | Temp 98.1°F | Resp 18 | Ht 72.0 in | Wt 231.8 lb

## 2022-08-29 DIAGNOSIS — C251 Malignant neoplasm of body of pancreas: Secondary | ICD-10-CM | POA: Insufficient documentation

## 2022-08-29 DIAGNOSIS — Z5189 Encounter for other specified aftercare: Secondary | ICD-10-CM | POA: Diagnosis not present

## 2022-08-29 DIAGNOSIS — Z5111 Encounter for antineoplastic chemotherapy: Secondary | ICD-10-CM | POA: Insufficient documentation

## 2022-08-29 DIAGNOSIS — Z452 Encounter for adjustment and management of vascular access device: Secondary | ICD-10-CM | POA: Insufficient documentation

## 2022-08-29 DIAGNOSIS — Z8546 Personal history of malignant neoplasm of prostate: Secondary | ICD-10-CM | POA: Insufficient documentation

## 2022-08-29 LAB — CMP (CANCER CENTER ONLY)
ALT: 90 U/L — ABNORMAL HIGH (ref 0–44)
AST: 35 U/L (ref 15–41)
Albumin: 4 g/dL (ref 3.5–5.0)
Alkaline Phosphatase: 139 U/L — ABNORMAL HIGH (ref 38–126)
Anion gap: 9 (ref 5–15)
BUN: 13 mg/dL (ref 8–23)
CO2: 27 mmol/L (ref 22–32)
Calcium: 9.3 mg/dL (ref 8.9–10.3)
Chloride: 104 mmol/L (ref 98–111)
Creatinine: 0.88 mg/dL (ref 0.61–1.24)
GFR, Estimated: >60 mL/min (ref >60)
Glucose, Bld: 139 mg/dL — ABNORMAL HIGH (ref 70–99)
Potassium: 4.1 mmol/L (ref 3.5–5.1)
Sodium: 140 mmol/L (ref 135–145)
Total Bilirubin: 0.5 mg/dL (ref 0.3–1.2)
Total Protein: 6.4 g/dL — ABNORMAL LOW (ref 6.5–8.1)

## 2022-08-29 LAB — CBC WITH DIFFERENTIAL (CANCER CENTER ONLY)
Abs Immature Granulocytes: 0.62 10*3/uL — ABNORMAL HIGH (ref 0.00–0.07)
Basophils Absolute: 0.1 10*3/uL (ref 0.0–0.1)
Basophils Relative: 1 %
Eosinophils Absolute: 0.1 10*3/uL (ref 0.0–0.5)
Eosinophils Relative: 1 %
HCT: 39.9 % (ref 39.0–52.0)
Hemoglobin: 13.2 g/dL (ref 13.0–17.0)
Immature Granulocytes: 5 %
Lymphocytes Relative: 11 %
Lymphs Abs: 1.4 10*3/uL (ref 0.7–4.0)
MCH: 28.3 pg (ref 26.0–34.0)
MCHC: 33.1 g/dL (ref 30.0–36.0)
MCV: 85.6 fL (ref 80.0–100.0)
Monocytes Absolute: 1.2 10*3/uL — ABNORMAL HIGH (ref 0.1–1.0)
Monocytes Relative: 9 %
Neutro Abs: 9.5 10*3/uL — ABNORMAL HIGH (ref 1.7–7.7)
Neutrophils Relative %: 73 %
Platelet Count: 134 10*3/uL — ABNORMAL LOW (ref 150–400)
RBC: 4.66 MIL/uL (ref 4.22–5.81)
RDW: 16.1 % — ABNORMAL HIGH (ref 11.5–15.5)
WBC Count: 12.9 10*3/uL — ABNORMAL HIGH (ref 4.0–10.5)
nRBC: 0.4 % — ABNORMAL HIGH (ref 0.0–0.2)

## 2022-08-29 MED ORDER — HYDROMORPHONE HCL 4 MG PO TABS: 4.0000 mg | ORAL_TABLET | ORAL | 0 refills | Status: DC | PRN

## 2022-08-29 NOTE — Telephone Encounter (Signed)
I spoke with the pt and he tells me that he is much better- he is breathing better and has no pain and is very appreciative of Dr Rush Landmark and thinks he was sent from God.

## 2022-08-29 NOTE — Progress Notes (Signed)
[facility] OFFICE PROGRESS NOTE   Diagnosis: Pancreas cancer  INTERVAL HISTORY:   Mr. Gamel completed cycle 2 FOLFOX on 08/16/2022.  He received G-CSF on 08/18/2022.  He did not have significant bone pain after the G-CSF.  No nausea/vomiting, mouth sores, or neuropathy symptoms. He underwent an ERCP and removal of bile duct stones and sludge on 08/23/2022.  He reports significant improvement in abdominal pain following this procedure.  He developed a "upset stomach "over the past 2 days.  This did not improve with Pepcid or antiemetics.  He had relief when he took hydromorphone.  The pain is improved today.    Objective:  Vital signs in last 24 hours:  Blood pressure 113/79, pulse (!) 58, temperature 98.1 F (36.7 C), temperature source Oral, resp. rate 18, height 6' (1.829 m), weight 231 lb 12.8 oz (105.1 kg), SpO2 98 %.    HEENT: Mild thrush at the bilateral buccal mucosa and tongue, no ulcers Resp: Inspiratory bronchial sounds at the posterior chest bilaterally, no respiratory distress Cardio: Regular rate and rhythm GI: No hepatosplenomegaly, nontender Vascular: No leg edema  Skin: Palms without erythema  Portacath/PICC-without erythema  Lab Results:  Lab Results  Component Value Date   WBC 12.9 (H) 08/29/2022   HGB 13.2 08/29/2022   HCT 39.9 08/29/2022   MCV 85.6 08/29/2022   PLT 134 (L) 08/29/2022   NEUTROABS 9.5 (H) 08/29/2022    CMP  Lab Results  Component Value Date   NA 140 08/29/2022   K 4.1 08/29/2022   CL 104 08/29/2022   CO2 27 08/29/2022   GLUCOSE 139 (H) 08/29/2022   BUN 13 08/29/2022   CREATININE 0.88 08/29/2022   CALCIUM 9.3 08/29/2022   PROT 6.4 (L) 08/29/2022   ALBUMIN 4.0 08/29/2022   AST 35 08/29/2022   ALT 90 (H) 08/29/2022   ALKPHOS 139 (H) 08/29/2022   BILITOT 0.5 08/29/2022   GFRNONAA >60 08/29/2022   GFRAA >90 08/10/2013    Lab Results  Component Value Date   FAO130 906 (H) 07/27/2022     Medications: I  have reviewed the patient's current medications.   Assessment/Plan: Pancreas cancer FNA biopsy of a pancreas body/tail mass 07/05/2022-adenocarcinoma CT abdomen/pelvis 06/14/2022-hypoenhancing pancreas body/tail mass with effacement of the splenic vein, no evidence of lymphadenopathy or metastatic disease CA 19-9 on 06/18/2022-767 EUS 07/05/2022-a 7 x 29 mm pancreas body/tail mass, abutment of the splenic artery, no malignant appearing lymph nodes, T2N0 by EUS CTs 07/15/2022-pancreas body/tail mass with no evidence of metastatic disease, splenic vein associated with the tumor, no arterial involvement, stable right greater than left lung nodules favored benign Cycle 1 FOLFOX 08/01/2022 Cycle 2 FOLFOX, 08/16/2022, neulasta Cycle 3 FOLFIRINOX 08/29/2022, Neulasta Prostate cancer, status post prostatectomy 1990 Chronic elevation of the PSA-maintained on Trelstar, followed by Dr. Jeffie Pollock   3.  Kidney stones 4.   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  Abdominal pain, likely secondary to #1 6.  Family history of prostate and pancreas cancer 7.  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  Typhlitis in 2014 9.  Colon polyps-tubular adenomas on colonoscopy 03/27/2022    Disposition: Mr. Berlinger has completed 2 cycles of systemic therapy.  He has tolerated the chemotherapy well.  He did not have significant bone pain when he received G-CSF with cycle 2.  He will receive G-CSF again this cycle.  Irinotecan will be added to the chemotherapy regimen  today.  We reviewed potential toxicities associated with irinotecan including chance of nausea/vomiting and acute/delayed diarrhea.  He agrees to proceed.  Mr. Gabler will return for an office visit and chemotherapy in 2 weeks.  I refilled his prescription for hydromorphone.  Betsy Coder, MD  08/29/2022  11:26 AM

## 2022-08-29 NOTE — Addendum Note (Signed)
Addended by: Tania Ade on: 08/29/2022 12:04 PM   Modules accepted: Orders

## 2022-08-29 NOTE — Progress Notes (Signed)
Patient seen by Dr. [name] today and OK for treatment on 08/30/22.  Vitals are not all within treatment parameters. OK to treat w/pulse 58  Labs reviewed by Dr. [name] and are not all within treatment parameters. ALT = 90--OK to proceed  Per physician team, patient is ready for treatment. Please note that modifications are being made to the treatment plan including Adding irinotecan to treatment plan

## 2022-08-30 ENCOUNTER — Inpatient Hospital Stay: Payer: Medicare HMO

## 2022-08-30 VITALS — BP 102/69 | HR 52 | Temp 98.3°F | Resp 18

## 2022-08-30 DIAGNOSIS — Z5111 Encounter for antineoplastic chemotherapy: Secondary | ICD-10-CM | POA: Diagnosis not present

## 2022-08-30 DIAGNOSIS — C251 Malignant neoplasm of body of pancreas: Secondary | ICD-10-CM

## 2022-08-30 LAB — CANCER ANTIGEN 19-9: CA 19-9: 1171 U/mL — ABNORMAL HIGH (ref 0–35)

## 2022-08-30 MED ORDER — SODIUM CHLORIDE 0.9 % IV SOLN
150.0000 mg | Freq: Once | INTRAVENOUS | Status: AC
  Administered 2022-08-30: 150 mg via INTRAVENOUS
  Filled 2022-08-30: qty 5

## 2022-08-30 MED ORDER — SODIUM CHLORIDE 0.9% FLUSH
10.0000 mL | INTRAVENOUS | Status: DC | PRN
  Administered 2022-08-30: 10 mL

## 2022-08-30 MED ORDER — SODIUM CHLORIDE 0.9 % IV SOLN
2400.0000 mg/m2 | INTRAVENOUS | Status: DC
  Administered 2022-08-30: 5650 mg via INTRAVENOUS
  Filled 2022-08-30: qty 100

## 2022-08-30 MED ORDER — DEXTROSE 5 % IV SOLN: Freq: Once | INTRAVENOUS | Status: AC

## 2022-08-30 MED ORDER — OXALIPLATIN CHEMO INJECTION 100 MG/20ML
150.0000 mg | Freq: Once | INTRAVENOUS | Status: AC
  Administered 2022-08-30: 150 mg via INTRAVENOUS
  Filled 2022-08-30: qty 10

## 2022-08-30 MED ORDER — SODIUM CHLORIDE 0.9 % IV SOLN
150.0000 mg/m2 | Freq: Once | INTRAVENOUS | Status: AC
  Administered 2022-08-30: 360 mg via INTRAVENOUS
  Filled 2022-08-30: qty 4

## 2022-08-30 MED ORDER — ATROPINE SULFATE 1 MG/ML IV SOLN
0.5000 mg | Freq: Once | INTRAVENOUS | Status: AC | PRN
  Administered 2022-08-30: 0.5 mg via INTRAVENOUS
  Filled 2022-08-30: qty 1

## 2022-08-30 MED ORDER — SODIUM CHLORIDE 0.9 % IV SOLN
10.0000 mg | Freq: Once | INTRAVENOUS | Status: AC
  Administered 2022-08-30: 10 mg via INTRAVENOUS
  Filled 2022-08-30: qty 1

## 2022-08-30 MED ORDER — SODIUM CHLORIDE 0.9 % IV SOLN
400.0000 mg/m2 | Freq: Once | INTRAVENOUS | Status: AC
  Administered 2022-08-30: 940 mg via INTRAVENOUS
  Filled 2022-08-30: qty 47

## 2022-08-30 MED ORDER — PALONOSETRON HCL INJECTION 0.25 MG/5ML
0.2500 mg | Freq: Once | INTRAVENOUS | Status: AC
  Administered 2022-08-30: 0.25 mg via INTRAVENOUS
  Filled 2022-08-30: qty 5

## 2022-08-30 NOTE — Telephone Encounter (Signed)
[name], Glad to hear.  Thanks for the update. GM

## 2022-08-30 NOTE — Patient Instructions (Addendum)
[facility]  The chemotherapy medication bag should finish at 46 hours, 96 hours, or 7 days. For example, if your pump is scheduled for 46 hours and it was put on at 4:00 p.m., it should finish at 2:00 p.m. the day it is scheduled to come off regardless of your appointment time.     Estimated time to finish at 12pm Saturday , September 01, 2022. Appointment is at [facility] center at 12pm.    If the display on your pump reads "Low Volume" and it is beeping, take the batteries out of the pump and come to the cancer center for it to be taken off.   If the pump alarms go off prior to the pump reading "Low Volume" then call [phone] and someone can assist you.  If the plunger comes out and the chemotherapy medication is leaking out, please use your home chemo spill kit to clean up the spill. Do NOT use paper towels or other household products.  If you have problems or questions regarding your pump, please call either [phone] (24 hours a day) or the cancer center Monday-Friday 8:00 a.m.- 4:30 p.m. at the clinic number and we will assist you. If you are unable to get assistance, then go to the nearest Emergency Department and ask the staff to contact the IV team for assistance.   Discharge Instructions: Thank you for choosing [facility] to provide your oncology and hematology care.   If you have a lab appointment with the [facility], please go directly to the [facility] and check in at the registration area.   Wear comfortable clothing and clothing appropriate for easy access to any Portacath or PICC line.   We strive to give you quality time with your provider. You may need to reschedule your appointment if you arrive late (15 or more minutes).  Arriving late affects you and other patients whose appointments are after yours.  Also, if you miss three or more appointments without notifying the office, you may be dismissed from the clinic  at the provider's discretion.      For prescription refill requests, have your pharmacy contact our office and allow 72 hours for refills to be completed.    Today you received the following chemotherapy and/or immunotherapy agents Oxaliplatin, Irinotecan, Leucovorin, Fluorouracil      To help prevent nausea and vomiting after your treatment, we encourage you to take your nausea medication as directed.  BELOW ARE SYMPTOMS THAT SHOULD BE REPORTED IMMEDIATELY: *FEVER GREATER THAN 100.4 F (38 C) OR HIGHER *CHILLS OR SWEATING *NAUSEA AND VOMITING THAT IS NOT CONTROLLED WITH YOUR NAUSEA MEDICATION *UNUSUAL SHORTNESS OF BREATH *UNUSUAL BRUISING OR BLEEDING *URINARY PROBLEMS (pain or burning when urinating, or frequent urination) *BOWEL PROBLEMS (unusual diarrhea, constipation, pain near the anus) TENDERNESS IN MOUTH AND THROAT WITH OR WITHOUT PRESENCE OF ULCERS (sore throat, sores in mouth, or a toothache) UNUSUAL RASH, SWELLING OR PAIN  UNUSUAL VAGINAL DISCHARGE OR ITCHING   Items with * indicate a potential emergency and should be followed up as soon as possible or go to the Emergency Department if any problems should occur.  Please show the CHEMOTHERAPY ALERT CARD or IMMUNOTHERAPY ALERT CARD at check-in to the Emergency Department and triage nurse.  Should you have questions after your visit or need to cancel or reschedule your appointment, please contact [facility]  Dept: [phone]  and follow the prompts.  Office hours are  8:00 a.m. to 4:30 p.m. Monday - Friday. Please note that voicemails left after 4:00 p.m. may not be returned until the following business day.  We are closed weekends and major holidays. You have access to a nurse at all times for urgent questions. Please call the main number to the clinic Dept: [phone] and follow the prompts.   For any non-urgent questions, you may also contact your provider using MyChart. We now offer e-Visits for  anyone 74 and older to request care online for non-urgent symptoms. For details visit [email or web address].   Also download the MyChart app! Go to the app store, search "MyChart", open the app, select [facility], and log in with your MyChart username and password.  Masks are optional in the cancer centers. If you would like for your care team to wear a mask while they are taking care of you, please let them know. You may have one support person who is at least 77 years old accompany you for your appointments.  Oxaliplatin Injection What is this medication? OXALIPLATIN (ox AL i PLA tin) treats some types of cancer. It works by slowing down the growth of cancer cells. This medicine may be used for other purposes; ask your health care provider or pharmacist if you have questions. COMMON BRAND NAME(S): Eloxatin What should I tell my care team before I take this medication? They need to know if you have any of these conditions: Heart disease History of irregular heartbeat or rhythm Liver disease Low blood cell levels (white cells, red cells, and platelets) Lung or breathing disease, such as asthma Take medications that treat or prevent blood clots Tingling of the fingers, toes, or other nerve disorder An unusual or allergic reaction to oxaliplatin, other medications, foods, dyes, or preservatives If you or your partner are pregnant or trying to get pregnant Breast-feeding How should I use this medication? This medication is injected into a vein. It is given by your care team in a hospital or clinic setting. Talk to your care team about the use of this medication in children. Special care may be needed. Overdosage: If you think you have taken too much of this medicine contact a poison control center or emergency room at once. NOTE: This medicine is only for you. Do not share this medicine with others. What if I miss a dose? Keep appointments for follow-up doses. It is important not to miss  a dose. Call your care team if you are unable to keep an appointment. What may interact with this medication? Do not take this medication with any of the following: Cisapride Dronedarone Pimozide Thioridazine This medication may also interact with the following: Aspirin and aspirin-like medications Certain medications that treat or prevent blood clots, such as warfarin, apixaban, dabigatran, and rivaroxaban Cisplatin Cyclosporine Diuretics Medications for infection, such as acyclovir, adefovir, amphotericin B, bacitracin, cidofovir, foscarnet, ganciclovir, gentamicin, pentamidine, vancomycin NSAIDs, medications for pain and inflammation, such as ibuprofen or naproxen Other medications that cause heart rhythm changes Pamidronate Zoledronic acid This list may not describe all possible interactions. Give your health care provider a list of all the medicines, herbs, non-prescription drugs, or dietary supplements you use. Also tell them if you smoke, drink alcohol, or use illegal drugs. Some items may interact with your medicine. What should I watch for while using this medication? Your condition will be monitored carefully while you are receiving this medication. You may need blood work while taking this medication. This medication may make you feel generally unwell. This  is not uncommon as chemotherapy can affect healthy cells as well as cancer cells. Report any side effects. Continue your course of treatment even though you feel ill unless your care team tells you to stop. This medication may increase your risk of getting an infection. Call your care team for advice if you get a fever, chills, sore throat, or other symptoms of a cold or flu. Do not treat yourself. Try to avoid being around people who are sick. Avoid taking medications that contain aspirin, acetaminophen, ibuprofen, naproxen, or ketoprofen unless instructed by your care team. These medications may hide a fever. Be careful  brushing or flossing your teeth or using a toothpick because you may get an infection or bleed more easily. If you have any dental work done, tell your dentist you are receiving this medication. This medication can make you more sensitive to cold. Do not drink cold drinks or use ice. Cover exposed skin before coming in contact with cold temperatures or cold objects. When out in cold weather wear warm clothing and cover your mouth and nose to warm the air that goes into your lungs. Tell your care team if you get sensitive to the cold. Talk to your care team if you or your partner are pregnant or think either of you might be pregnant. This medication can cause serious birth defects if taken during pregnancy and for 9 months after the last dose. A negative pregnancy test is required before starting this medication. A reliable form of contraception is recommended while taking this medication and for 9 months after the last dose. Talk to your care team about effective forms of contraception. Do not father a child while taking this medication and for 6 months after the last dose. Use a condom while having sex during this time period. Do not breastfeed while taking this medication and for 3 months after the last dose. This medication may cause infertility. Talk to your care team if you are concerned about your fertility. What side effects may I notice from receiving this medication? Side effects that you should report to your care team as soon as possible: Allergic reactions--skin rash, itching, hives, swelling of the face, lips, tongue, or throat Bleeding--bloody or black, tar-like stools, vomiting blood or brown material that looks like coffee grounds, red or dark brown urine, small red or purple spots on skin, unusual bruising or bleeding Dry cough, shortness of breath or trouble breathing Heart rhythm changes--fast or irregular heartbeat, dizziness, feeling faint or lightheaded, chest pain, trouble  breathing Infection--fever, chills, cough, sore throat, wounds that don't heal, pain or trouble when passing urine, general feeling of discomfort or being unwell Liver injury--right upper belly pain, loss of appetite, nausea, light-colored stool, dark yellow or brown urine, yellowing skin or eyes, unusual weakness or fatigue Low red blood cell level--unusual weakness or fatigue, dizziness, headache, trouble breathing Muscle injury--unusual weakness or fatigue, muscle pain, dark yellow or brown urine, decrease in amount of urine Pain, tingling, or numbness in the hands or feet Sudden and severe headache, confusion, change in vision, seizures, which may be signs of posterior reversible encephalopathy syndrome (PRES) Unusual bruising or bleeding Side effects that usually do not require medical attention (report to your care team if they continue or are bothersome): Diarrhea Nausea Pain, redness, or swelling with sores inside the mouth or throat Unusual weakness or fatigue Vomiting This list may not describe all possible side effects. Call your doctor for medical advice about side effects. You may report  side effects to FDA at 1-800-FDA-1088. Where should I keep my medication? This medication is given in a hospital or clinic. It will not be stored at home. NOTE: This sheet is a summary. It may not cover all possible information. If you have questions about this medicine, talk to your doctor, pharmacist, or health care provider.  2023 Elsevier/Gold Standard (2022-02-09 00:00:00)  Irinotecan Injection What is this medication? IRINOTECAN (ir in oh TEE kan) treats some types of cancer. It works by slowing down the growth of cancer cells. This medicine may be used for other purposes; ask your health care provider or pharmacist if you have questions. COMMON BRAND NAME(S): Camptosar What should I tell my care team before I take this medication? They need to know if you have any of these  conditions: Dehydration Diarrhea Infection, especially a viral infection, such as chickenpox, cold sores, herpes Liver disease Low blood cell levels (white cells, red cells, and platelets) Low levels of electrolytes, such as calcium, magnesium, or potassium in your blood Recent or ongoing radiation An unusual or allergic reaction to irinotecan, other medications, foods, dyes, or preservatives If you or your partner are pregnant or trying to get pregnant Breast-feeding How should I use this medication? This medication is injected into a vein. It is given by your care team in a hospital or clinic setting. Talk to your care team about the use of this medication in children. Special care may be needed. Overdosage: If you think you have taken too much of this medicine contact a poison control center or emergency room at once. NOTE: This medicine is only for you. Do not share this medicine with others. What if I miss a dose? Keep appointments for follow-up doses. It is important not to miss your dose. Call your care team if you are unable to keep an appointment. What may interact with this medication? Do not take this medication with any of the following: Cobicistat Itraconazole This medication may also interact with the following: Certain antibiotics, such as clarithromycin, rifampin, rifabutin Certain antivirals for HIV or AIDS Certain medications for fungal infections, such as ketoconazole, posaconazole, voriconazole Certain medications for seizures, such as carbamazepine, phenobarbital, phenytoin Gemfibrozil Nefazodone St. John's wort This list may not describe all possible interactions. Give your health care provider a list of all the medicines, herbs, non-prescription drugs, or dietary supplements you use. Also tell them if you smoke, drink alcohol, or use illegal drugs. Some items may interact with your medicine. What should I watch for while using this medication? Your condition  will be monitored carefully while you are receiving this medication. You may need blood work while taking this medication. This medication may make you feel generally unwell. This is not uncommon as chemotherapy can affect healthy cells as well as cancer cells. Report any side effects. Continue your course of treatment even though you feel ill unless your care team tells you to stop. This medication can cause serious side effects. To reduce the risk, your care team may give you other medications to take before receiving this one. Be sure to follow the directions from your care team. This medication may affect your coordination, reaction time, or judgement. Do not drive or operate machinery until you know how this medication affects you. Sit up or stand slowly to reduce the risk of dizzy or fainting spells. Drinking alcohol with this medication can increase the risk of these side effects. This medication may increase your risk of getting an infection. Call your  care team for advice if you get a fever, chills, sore throat, or other symptoms of a cold or flu. Do not treat yourself. Try to avoid being around people who are sick. Avoid taking medications that contain aspirin, acetaminophen, ibuprofen, naproxen, or ketoprofen unless instructed by your care team. These medications may hide a fever. This medication may increase your risk to bruise or bleed. Call your care team if you notice any unusual bleeding. Be careful brushing or flossing your teeth or using a toothpick because you may get an infection or bleed more easily. If you have any dental work done, tell your dentist you are receiving this medication. Talk to your care team if you or your partner are pregnant or think either of you might be pregnant. This medication can cause serious birth defects if taken during pregnancy and for 6 months after the last dose. You will need a negative pregnancy test before starting this medication. Contraception is  recommended while taking this medication and for 6 months after the last dose. Your care team can help you find the option that works for you. Do not father a child while taking this medication and for 3 months after the last dose. Use a condom for contraception during this time period. Do not breastfeed while taking this medication and for 7 days after the last dose. This medication may cause infertility. Talk to your care team if you are concerned about your fertility. What side effects may I notice from receiving this medication? Side effects that you should report to your care team as soon as possible: Allergic reactions--skin rash, itching, hives, swelling of the face, lips, tongue, or throat Dry cough, shortness of breath or trouble breathing Increased saliva or tears, increased sweating, stomach cramping, diarrhea, small pupils, unusual weakness or fatigue, slow heartbeat Infection--fever, chills, cough, sore throat, wounds that don't heal, pain or trouble when passing urine, general feeling of discomfort or being unwell Kidney injury--decrease in the amount of urine, swelling of the ankles, hands, or feet Low red blood cell level--unusual weakness or fatigue, dizziness, headache, trouble breathing Severe or prolonged diarrhea Unusual bruising or bleeding Side effects that usually do not require medical attention (report to your care team if they continue or are bothersome): Constipation Diarrhea Hair loss Loss of appetite Nausea Stomach pain This list may not describe all possible side effects. Call your doctor for medical advice about side effects. You may report side effects to FDA at 1-800-FDA-1088. Where should I keep my medication? This medication is given in a hospital or clinic. It will not be stored at home. NOTE: This sheet is a summary. It may not cover all possible information. If you have questions about this medicine, talk to your doctor, pharmacist, or health care  provider.  2023 Elsevier/Gold Standard (2022-02-22 00:00:00)  Leucovorin Injection What is this medication? LEUCOVORIN (loo koe VOR in) prevents side effects from certain medications, such as methotrexate. It works by increasing folate levels. This helps protect healthy cells in your body. It may also be used to treat anemia caused by low levels of folate. It can also be used with fluorouracil, a type of chemotherapy, to treat colorectal cancer. It works by increasing the effects of fluorouracil in the body. This medicine may be used for other purposes; ask your health care provider or pharmacist if you have questions. What should I tell my care team before I take this medication? They need to know if you have any of these conditions: Anemia  from low levels of vitamin B12 in the blood An unusual or allergic reaction to leucovorin, folic acid, other medications, foods, dyes, or preservatives Pregnant or trying to get pregnant Breastfeeding How should I use this medication? This medication is injected into a vein or a muscle. It is given by your care team in a hospital or clinic setting. Talk to your care team about the use of this medication in children. Special care may be needed. Overdosage: If you think you have taken too much of this medicine contact a poison control center or emergency room at once. NOTE: This medicine is only for you. Do not share this medicine with others. What if I miss a dose? Keep appointments for follow-up doses. It is important not to miss your dose. Call your care team if you are unable to keep an appointment. What may interact with this medication? Capecitabine Fluorouracil Phenobarbital Phenytoin Primidone Trimethoprim;sulfamethoxazole This list may not describe all possible interactions. Give your health care provider a list of all the medicines, herbs, non-prescription drugs, or dietary supplements you use. Also tell them if you smoke, drink alcohol, or  use illegal drugs. Some items may interact with your medicine. What should I watch for while using this medication? Your condition will be monitored carefully while you are receiving this medication. This medication may increase the side effects of 5-fluorouracil. Tell your care team if you have diarrhea or mouth sores that do not get better or that get worse. What side effects may I notice from receiving this medication? Side effects that you should report to your care team as soon as possible: Allergic reactions--skin rash, itching, hives, swelling of the face, lips, tongue, or throat This list may not describe all possible side effects. Call your doctor for medical advice about side effects. You may report side effects to FDA at 1-800-FDA-1088. Where should I keep my medication? This medication is given in a hospital or clinic. It will not be stored at home. NOTE: This sheet is a summary. It may not cover all possible information. If you have questions about this medicine, talk to your doctor, pharmacist, or health care provider.  2023 Elsevier/Gold Standard (2022-02-23 00:00:00)  Fluorouracil Injection What is this medication? FLUOROURACIL (flure oh YOOR a sil) treats some types of cancer. It works by slowing down the growth of cancer cells. This medicine may be used for other purposes; ask your health care provider or pharmacist if you have questions. COMMON BRAND NAME(S): Adrucil What should I tell my care team before I take this medication? They need to know if you have any of these conditions: Blood disorders Dihydropyrimidine dehydrogenase (DPD) deficiency Infection, such as chickenpox, cold sores, herpes Kidney disease Liver disease Poor nutrition Recent or ongoing radiation therapy An unusual or allergic reaction to fluorouracil, other medications, foods, dyes, or preservatives If you or your partner are pregnant or trying to get pregnant Breast-feeding How should I use this  medication? This medication is injected into a vein. It is administered by your care team in a hospital or clinic setting. Talk to your care team about the use of this medication in children. Special care may be needed. Overdosage: If you think you have taken too much of this medicine contact a poison control center or emergency room at once. NOTE: This medicine is only for you. Do not share this medicine with others. What if I miss a dose? Keep appointments for follow-up doses. It is important not to miss your dose. Call  your care team if you are unable to keep an appointment. What may interact with this medication? Do not take this medication with any of the following: Live virus vaccines This medication may also interact with the following: Medications that treat or prevent blood clots, such as warfarin, enoxaparin, dalteparin This list may not describe all possible interactions. Give your health care provider a list of all the medicines, herbs, non-prescription drugs, or dietary supplements you use. Also tell them if you smoke, drink alcohol, or use illegal drugs. Some items may interact with your medicine. What should I watch for while using this medication? Your condition will be monitored carefully while you are receiving this medication. This medication may make you feel generally unwell. This is not uncommon as chemotherapy can affect healthy cells as well as cancer cells. Report any side effects. Continue your course of treatment even though you feel ill unless your care team tells you to stop. In some cases, you may be given additional medications to help with side effects. Follow all directions for their use. This medication may increase your risk of getting an infection. Call your care team for advice if you get a fever, chills, sore throat, or other symptoms of a cold or flu. Do not treat yourself. Try to avoid being around people who are sick. This medication may increase your risk  to bruise or bleed. Call your care team if you notice any unusual bleeding. Be careful brushing or flossing your teeth or using a toothpick because you may get an infection or bleed more easily. If you have any dental work done, tell your dentist you are receiving this medication. Avoid taking medications that contain aspirin, acetaminophen, ibuprofen, naproxen, or ketoprofen unless instructed by your care team. These medications may hide a fever. Do not treat diarrhea with over the counter products. Contact your care team if you have diarrhea that lasts more than 2 days or if it is severe and watery. This medication can make you more sensitive to the sun. Keep out of the sun. If you cannot avoid being in the sun, wear protective clothing and sunscreen. Do not use sun lamps, tanning beds, or tanning booths. Talk to your care team if you or your partner wish to become pregnant or think you might be pregnant. This medication can cause serious birth defects if taken during pregnancy and for 3 months after the last dose. A reliable form of contraception is recommended while taking this medication and for 3 months after the last dose. Talk to your care team about effective forms of contraception. Do not father a child while taking this medication and for 3 months after the last dose. Use a condom while having sex during this time period. Do not breastfeed while taking this medication. This medication may cause infertility. Talk to your care team if you are concerned about your fertility. What side effects may I notice from receiving this medication? Side effects that you should report to your care team as soon as possible: Allergic reactions--skin rash, itching, hives, swelling of the face, lips, tongue, or throat Heart attack--pain or tightness in the chest, shoulders, arms, or jaw, nausea, shortness of breath, cold or clammy skin, feeling faint or lightheaded Heart failure--shortness of breath, swelling of  the ankles, feet, or hands, sudden weight gain, unusual weakness or fatigue Heart rhythm changes--fast or irregular heartbeat, dizziness, feeling faint or lightheaded, chest pain, trouble breathing High ammonia level--unusual weakness or fatigue, confusion, loss of appetite, nausea, vomiting,  seizures Infection--fever, chills, cough, sore throat, wounds that don't heal, pain or trouble when passing urine, general feeling of discomfort or being unwell Low red blood cell level--unusual weakness or fatigue, dizziness, headache, trouble breathing Pain, tingling, or numbness in the hands or feet, muscle weakness, change in vision, confusion or trouble speaking, loss of balance or coordination, trouble walking, seizures Redness, swelling, and blistering of the skin over hands and feet Severe or prolonged diarrhea Unusual bruising or bleeding Side effects that usually do not require medical attention (report to your care team if they continue or are bothersome): Dry skin Headache Increased tears Nausea Pain, redness, or swelling with sores inside the mouth or throat Sensitivity to light Vomiting This list may not describe all possible side effects. Call your doctor for medical advice about side effects. You may report side effects to FDA at 1-800-FDA-1088. Where should I keep my medication? This medication is given in a hospital or clinic. It will not be stored at home. NOTE: This sheet is a summary. It may not cover all possible information. If you have questions about this medicine, talk to your doctor, pharmacist, or health care provider.  2023 Elsevier/Gold Standard (2022-02-20 00:00:00)

## 2022-08-30 NOTE — Progress Notes (Signed)
1300--patient stated he started to have a "cramping feeling" in his stomach during the irinotecan infusion.  Irinotecan and leucovorin were stopped, line was flushed with normal saline, and atropine was administered.  Irinotecan and leucovorin were then restarted.

## 2022-09-01 ENCOUNTER — Inpatient Hospital Stay: Payer: Medicare HMO

## 2022-09-01 VITALS — BP 113/78 | HR 60 | Temp 98.3°F | Resp 18

## 2022-09-01 DIAGNOSIS — C251 Malignant neoplasm of body of pancreas: Secondary | ICD-10-CM

## 2022-09-01 DIAGNOSIS — Z5111 Encounter for antineoplastic chemotherapy: Secondary | ICD-10-CM | POA: Diagnosis not present

## 2022-09-01 MED ORDER — SODIUM CHLORIDE 0.9% FLUSH
10.0000 mL | INTRAVENOUS | Status: DC | PRN
  Administered 2022-09-01: 10 mL

## 2022-09-01 MED ORDER — PEGFILGRASTIM INJECTION 6 MG/0.6ML ~~LOC~~
6.0000 mg | PREFILLED_SYRINGE | Freq: Once | SUBCUTANEOUS | Status: AC
  Administered 2022-09-01: 6 mg via SUBCUTANEOUS

## 2022-09-01 MED ORDER — HEPARIN SOD (PORK) LOCK FLUSH 100 UNIT/ML IV SOLN
500.0000 [IU] | Freq: Once | INTRAVENOUS | Status: AC | PRN
  Administered 2022-09-01: 500 [IU]

## 2022-09-01 NOTE — Patient Instructions (Signed)

## 2022-09-06 ENCOUNTER — Encounter: Payer: Self-pay | Admitting: Genetic Counselor

## 2022-09-06 ENCOUNTER — Emergency Department (HOSPITAL_COMMUNITY)
Admission: EM | Admit: 2022-09-06 | Discharge: 2022-09-06 | Disposition: A | Discharge status: Home / Self Care | Payer: Medicare HMO | Attending: Emergency Medicine | Admitting: Emergency Medicine

## 2022-09-06 ENCOUNTER — Other Ambulatory Visit: Payer: Medicare HMO

## 2022-09-06 ENCOUNTER — Inpatient Hospital Stay: Payer: Medicare HMO | Admitting: Genetic Counselor

## 2022-09-06 ENCOUNTER — Other Ambulatory Visit: Payer: Self-pay

## 2022-09-06 DIAGNOSIS — Z8507 Personal history of malignant neoplasm of pancreas: Secondary | ICD-10-CM | POA: Diagnosis not present

## 2022-09-06 DIAGNOSIS — R1084 Generalized abdominal pain: Secondary | ICD-10-CM | POA: Diagnosis not present

## 2022-09-06 DIAGNOSIS — R197 Diarrhea, unspecified: Secondary | ICD-10-CM | POA: Insufficient documentation

## 2022-09-06 DIAGNOSIS — R112 Nausea with vomiting, unspecified: Secondary | ICD-10-CM | POA: Insufficient documentation

## 2022-09-06 DIAGNOSIS — Z1379 Encounter for other screening for genetic and chromosomal anomalies: Secondary | ICD-10-CM | POA: Insufficient documentation

## 2022-09-06 DIAGNOSIS — E860 Dehydration: Secondary | ICD-10-CM

## 2022-09-06 LAB — CBC WITH DIFFERENTIAL/PLATELET
Abs Immature Granulocytes: 0 10*3/uL (ref 0.00–0.07)
Band Neutrophils: 4 %
Basophils Absolute: 0.1 10*3/uL (ref 0.0–0.1)
Basophils Relative: 1 %
Eosinophils Absolute: 0 10*3/uL (ref 0.0–0.5)
Eosinophils Relative: 0 %
HCT: 40.5 % (ref 39.0–52.0)
Hemoglobin: 13.5 g/dL (ref 13.0–17.0)
Lymphocytes Relative: 12 %
Lymphs Abs: 1.6 10*3/uL (ref 0.7–4.0)
MCH: 29 pg (ref 26.0–34.0)
MCHC: 33.3 g/dL (ref 30.0–36.0)
MCV: 86.9 fL (ref 80.0–100.0)
Monocytes Absolute: 0.7 10*3/uL (ref 0.1–1.0)
Monocytes Relative: 5 %
Neutro Abs: 10.7 10*3/uL — ABNORMAL HIGH (ref 1.7–7.7)
Neutrophils Relative %: 78 %
Platelets: 168 10*3/uL (ref 150–400)
RBC: 4.66 MIL/uL (ref 4.22–5.81)
RDW: 15.8 % — ABNORMAL HIGH (ref 11.5–15.5)
WBC: 13 10*3/uL — ABNORMAL HIGH (ref 4.0–10.5)
nRBC: 0 % (ref 0.0–0.2)

## 2022-09-06 LAB — COMPREHENSIVE METABOLIC PANEL
ALT: 40 U/L (ref 0–44)
AST: 28 U/L (ref 15–41)
Albumin: 3.6 g/dL (ref 3.5–5.0)
Alkaline Phosphatase: 123 U/L (ref 38–126)
Anion gap: 9 (ref 5–15)
BUN: 11 mg/dL (ref 8–23)
CO2: 23 mmol/L (ref 22–32)
Calcium: 8.9 mg/dL (ref 8.9–10.3)
Chloride: 105 mmol/L (ref 98–111)
Creatinine, Ser: 0.72 mg/dL (ref 0.61–1.24)
GFR, Estimated: >60 mL/min (ref >60)
Glucose, Bld: 143 mg/dL — ABNORMAL HIGH (ref 70–99)
Potassium: 4.1 mmol/L (ref 3.5–5.1)
Sodium: 137 mmol/L (ref 135–145)
Total Bilirubin: 0.9 mg/dL (ref 0.3–1.2)
Total Protein: 6.4 g/dL — ABNORMAL LOW (ref 6.5–8.1)

## 2022-09-06 LAB — LIPASE, BLOOD: Lipase: 26 U/L (ref 11–51)

## 2022-09-06 MED ORDER — METOCLOPRAMIDE HCL 5 MG/ML IJ SOLN
5.0000 mg | Freq: Once | INTRAMUSCULAR | Status: AC
  Administered 2022-09-06: 5 mg via INTRAVENOUS
  Filled 2022-09-06: qty 2

## 2022-09-06 MED ORDER — DIPHENHYDRAMINE HCL 50 MG/ML IJ SOLN
12.5000 mg | Freq: Once | INTRAMUSCULAR | Status: AC
  Administered 2022-09-06: 12.5 mg via INTRAVENOUS
  Filled 2022-09-06: qty 1

## 2022-09-06 MED ORDER — LACTATED RINGERS IV BOLUS
2000.0000 mL | Freq: Once | INTRAVENOUS | Status: AC
  Administered 2022-09-06: 2000 mL via INTRAVENOUS

## 2022-09-06 MED ORDER — LACTATED RINGERS IV SOLN: INTRAVENOUS | Status: DC

## 2022-09-06 NOTE — ED Triage Notes (Signed)
Pt brought frtom cancer center where he was getting genetically tested. Pt has hx of pancreatic CA, reported to be "screaming in pain" and nausea/vomiting

## 2022-09-06 NOTE — ED Provider Notes (Signed)
[facility] DEPT Provider Note   CSN: [id number] Arrival date & time: 09/06/22  1448     History  Chief Complaint  Patient presents with   Abdominal Pain   Nausea    Jared [name] is a 76 y.o. male.  76 year old male with history of pancreatic cancer stage II who presents with on going watery diarrhea along with nonbilious emesis.  Patient last received chemotherapy last week.  No fever or chills.  Diarrhea has been unresponsive to home medications.  Did have some abdominal cramping but no localizing pain.  Has been using Zofran at home.  Went to cancer center today and started having worsening symptoms and was sent here for further management.       Home Medications Prior to Admission medications   Medication Sig Start Date End Date Taking? Authorizing Provider  acetaminophen (TYLENOL) 325 MG tablet Take 325 mg by mouth daily as needed for headache. Patient not taking: Reported on 08/29/2022    [provider]  Ascorbic Acid (VITAMIN C) 500 MG tablet Take 500 mg by mouth daily.      [provider]  brimonidine (ALPHAGAN) 0.15 % ophthalmic solution Place 1 drop into the left eye in the morning and at bedtime.    [provider]  Calcium Carbonate Antacid (TUMS ULTRA 1000 PO) Take 2,000-3,000 mg by mouth See admin instructions. 3000 mg by mouth in the morning and 2000 mg by mouth at night    [provider]  Carboxymethylcell-Glycerin PF (REFRESH OPTIVE PF) 0.5-0.9 % SOLN Place 1 drop into both eyes as needed (dry eyes). 04/28/18   [provider]  cholecalciferol (VITAMIN D3) 25 MCG (1000 UNIT) tablet Take 1,000 Units by mouth daily.    [provider]  Dorzolamide HCl-Timolol Mal PF 2-0.5 % SOLN Place 1 drop into the left eye 2 (two) times daily. 12/01/21   [provider]  Garlic 8144 MG CAPS Take 1,000 mg by mouth daily.    [provider]  GEMTESA 75 MG TABS Take 75 mg by mouth  daily. 01/31/22   [provider]  HYDROmorphone (DILAUDID) 4 MG tablet Take 1 tablet (4 mg total) by mouth every 4 (four) hours as needed for severe pain. 08/29/22   Ladell Pier, MD  ibuprofen (ADVIL,MOTRIN) 800 MG tablet Take 1 tablet (800 mg total) by mouth every 8 (eight) hours as needed. Patient not taking: Reported on 08/29/2022 09/09/18   [name], MD  lidocaine-prilocaine (EMLA) cream Apply 1 Application topically as needed. 07/27/22   Ladell Pier, MD  olopatadine (PATANOL) 0.1 % ophthalmic solution Place 1 drop into both eyes as needed for allergies. Patient not taking: Reported on 08/29/2022    [provider]  ondansetron (ZOFRAN) 8 MG tablet Take 1 tablet (8 mg total) by mouth every 8 (eight) hours as needed for nausea or vomiting (Starting day 3 after chemo as needed for nausea(received Aloxi with chemo)). 07/27/22   Ladell Pier, MD  prochlorperazine (COMPAZINE) 10 MG tablet Take 1 tablet (10 mg total) by mouth every 6 (six) hours as needed for nausea or vomiting. 07/25/22   Owens Shark, NP  sodium chloride (AYR) 0.65 % nasal spray Place 2-3 sprays into the nose as needed for congestion.    [provider]  Triptorelin Pamoate 11.25 MG SUSR Inject 11.25 mg into the muscle as needed (PSA).    [provider]      Allergies  Claritin-d 24 hour [loratadine-pseudoephedrine er], Milk-related compounds, and Lupron [leuprolide]    Review of Systems   Review of Systems  All other systems reviewed and are negative.   Physical Exam Updated Vital Signs BP 105/74 (BP Location: Right Arm)   Pulse (!) 55   Temp (!) 97.4 F (36.3 C) (Oral)   Resp 19   SpO2 100%  Physical Exam Vitals and nursing note reviewed.  Constitutional:      General: He is not in acute distress.    Appearance: Normal appearance. He is well-developed. He is not toxic-appearing.  HENT:     Head: Normocephalic and atraumatic.  Eyes:     General: Lids  are normal.     Conjunctiva/sclera: Conjunctivae normal.     Pupils: Pupils are equal, round, and reactive to light.  Neck:     Thyroid: No thyroid mass.     Trachea: No tracheal deviation.  Cardiovascular:     Rate and Rhythm: Normal rate and regular rhythm.     Heart sounds: Normal heart sounds. No murmur heard.    No gallop.  Pulmonary:     Effort: Pulmonary effort is normal. No respiratory distress.     Breath sounds: Normal breath sounds. No stridor. No decreased breath sounds, wheezing, rhonchi or rales.  Abdominal:     General: There is no distension.     Palpations: Abdomen is soft.     Tenderness: There is no abdominal tenderness. There is no rebound.  Musculoskeletal:        General: No tenderness. Normal range of motion.     Cervical back: Normal range of motion and neck supple.  Skin:    General: Skin is warm and dry.     Findings: No abrasion or rash.  Neurological:     Mental Status: He is alert and oriented to person, place, and time. Mental status is at baseline.     GCS: GCS eye subscore is 4. GCS verbal subscore is 5. GCS motor subscore is 6.     Cranial Nerves: No cranial nerve deficit.     Sensory: No sensory deficit.     Motor: Motor function is intact.  Psychiatric:        Attention and Perception: Attention normal.        Speech: Speech normal.        Behavior: Behavior normal.     ED Results / Procedures / Treatments   Labs (all labs ordered are listed, but only abnormal results are displayed) Labs Reviewed  CBC WITH DIFFERENTIAL/PLATELET  COMPREHENSIVE METABOLIC PANEL  LIPASE, BLOOD    EKG None  Radiology No results found.  Procedures Procedures    Medications Ordered in ED Medications  lactated ringers bolus 2,000 mL (has no administration in time range)  lactated ringers infusion (has no administration in time range)    ED Course/ Medical Decision Making/ A&P                           Medical Decision Making Amount and/or  Complexity of Data Reviewed Labs: ordered.  Risk Prescription drug management.   Patient given IV hydration here along with antiemetics.  Has no evidence of acute abdominal findings at this time.  Labs are reassuring here.  Suspect that he is dehydrated.  This is likely from his ongoing chemotherapeutic treatment.  Patient able to take oral liquids at this time.  Due to sepsis but feel less likely.  He has no evidence of a surgical abdomen.  Wife at bedside and agrees that patient looks better.  Will discharge home and follow-up in the cancer center.        Final Clinical Impression(s) / ED Diagnoses Final diagnoses:  None    Rx / DC Orders ED Discharge Orders     None         Lacretia Leigh, MD 09/06/22 1826

## 2022-09-07 ENCOUNTER — Encounter: Payer: Self-pay | Admitting: Oncology

## 2022-09-07 ENCOUNTER — Telehealth: Payer: Self-pay | Admitting: *Deleted

## 2022-09-07 NOTE — Telephone Encounter (Signed)
Called Jared [name] to f/u on status since ER visit yesterday for abdominal pain, nausea and diarrhea. He had another episode of abdominal pain at 11:00 pm last night resolved with one dose of hydromorphone. No pain today.  Reports intermittent diarrhea with cramping on day 3, 5 and 7 of last treatment. Took ~ 4 mg Imodium with some relief. Nausea w/vomiting day 2 & 3 of last treatment. Informed this will be discussed at his next visit with provider prior to treatment.

## 2022-09-09 ENCOUNTER — Other Ambulatory Visit: Payer: Self-pay | Admitting: Oncology

## 2022-09-10 ENCOUNTER — Other Ambulatory Visit: Payer: Self-pay | Admitting: Oncology

## 2022-09-10 MED ORDER — ONDANSETRON HCL 8 MG PO TABS: 8.0000 mg | ORAL_TABLET | Freq: Three times a day (TID) | ORAL | 1 refills | Status: DC | PRN

## 2022-09-12 ENCOUNTER — Inpatient Hospital Stay: Payer: Medicare HMO

## 2022-09-12 ENCOUNTER — Encounter: Payer: Self-pay | Admitting: Nurse Practitioner

## 2022-09-12 ENCOUNTER — Inpatient Hospital Stay (HOSPITAL_BASED_OUTPATIENT_CLINIC_OR_DEPARTMENT_OTHER): Payer: Medicare HMO | Admitting: Nurse Practitioner

## 2022-09-12 ENCOUNTER — Inpatient Hospital Stay: Payer: Medicare HMO | Admitting: Nutrition

## 2022-09-12 VITALS — BP 113/67 | HR 68 | Temp 98.1°F | Resp 18 | Ht 72.0 in | Wt 223.0 lb

## 2022-09-12 VITALS — BP 97/72 | HR 54

## 2022-09-12 DIAGNOSIS — C251 Malignant neoplasm of body of pancreas: Secondary | ICD-10-CM

## 2022-09-12 DIAGNOSIS — Z5111 Encounter for antineoplastic chemotherapy: Secondary | ICD-10-CM | POA: Diagnosis not present

## 2022-09-12 LAB — CBC WITH DIFFERENTIAL (CANCER CENTER ONLY)
Abs Immature Granulocytes: 0.21 10*3/uL — ABNORMAL HIGH (ref 0.00–0.07)
Basophils Absolute: 0.1 10*3/uL (ref 0.0–0.1)
Basophils Relative: 1 %
Eosinophils Absolute: 0.2 10*3/uL (ref 0.0–0.5)
Eosinophils Relative: 3 %
HCT: 38.2 % — ABNORMAL LOW (ref 39.0–52.0)
Hemoglobin: 12.8 g/dL — ABNORMAL LOW (ref 13.0–17.0)
Immature Granulocytes: 3 %
Lymphocytes Relative: 14 %
Lymphs Abs: 1 10*3/uL (ref 0.7–4.0)
MCH: 29 pg (ref 26.0–34.0)
MCHC: 33.5 g/dL (ref 30.0–36.0)
MCV: 86.4 fL (ref 80.0–100.0)
Monocytes Absolute: 0.9 10*3/uL (ref 0.1–1.0)
Monocytes Relative: 12 %
Neutro Abs: 4.9 10*3/uL (ref 1.7–7.7)
Neutrophils Relative %: 67 %
Platelet Count: 175 10*3/uL (ref 150–400)
RBC: 4.42 MIL/uL (ref 4.22–5.81)
RDW: 16.9 % — ABNORMAL HIGH (ref 11.5–15.5)
WBC Count: 7.3 10*3/uL (ref 4.0–10.5)
nRBC: 0.3 % — ABNORMAL HIGH (ref 0.0–0.2)

## 2022-09-12 LAB — CMP (CANCER CENTER ONLY)
ALT: 55 U/L — ABNORMAL HIGH (ref 0–44)
AST: 24 U/L (ref 15–41)
Albumin: 4.1 g/dL (ref 3.5–5.0)
Alkaline Phosphatase: 85 U/L (ref 38–126)
Anion gap: 10 (ref 5–15)
BUN: 14 mg/dL (ref 8–23)
CO2: 26 mmol/L (ref 22–32)
Calcium: 9.5 mg/dL (ref 8.9–10.3)
Chloride: 108 mmol/L (ref 98–111)
Creatinine: 0.86 mg/dL (ref 0.61–1.24)
GFR, Estimated: >60 mL/min (ref >60)
Glucose, Bld: 138 mg/dL — ABNORMAL HIGH (ref 70–99)
Potassium: 3.5 mmol/L (ref 3.5–5.1)
Sodium: 144 mmol/L (ref 135–145)
Total Bilirubin: 0.5 mg/dL (ref 0.3–1.2)
Total Protein: 6.3 g/dL — ABNORMAL LOW (ref 6.5–8.1)

## 2022-09-12 MED ORDER — SODIUM CHLORIDE 0.9 % IV SOLN
10.0000 mg | Freq: Once | INTRAVENOUS | Status: AC
  Administered 2022-09-12: 10 mg via INTRAVENOUS
  Filled 2022-09-12: qty 10

## 2022-09-12 MED ORDER — ATROPINE SULFATE 1 MG/ML IV SOLN
0.5000 mg | Freq: Once | INTRAVENOUS | Status: AC | PRN
  Administered 2022-09-12: 0.5 mg via INTRAVENOUS
  Filled 2022-09-12: qty 1

## 2022-09-12 MED ORDER — SODIUM CHLORIDE 0.9 % IV SOLN
400.0000 mg/m2 | Freq: Once | INTRAVENOUS | Status: DC
  Filled 2022-09-12: qty 45.4

## 2022-09-12 MED ORDER — DEXTROSE 5 % IV SOLN: Freq: Once | INTRAVENOUS | Status: AC

## 2022-09-12 MED ORDER — SODIUM CHLORIDE 0.9% FLUSH
10.0000 mL | INTRAVENOUS | Status: DC | PRN
  Administered 2022-09-12: 10 mL

## 2022-09-12 MED ORDER — SODIUM CHLORIDE 0.9 % IV SOLN
2000.0000 mg/m2 | INTRAVENOUS | Status: DC
  Administered 2022-09-12: 4550 mg via INTRAVENOUS
  Filled 2022-09-12: qty 91

## 2022-09-12 MED ORDER — PALONOSETRON HCL INJECTION 0.25 MG/5ML
0.2500 mg | Freq: Once | INTRAVENOUS | Status: AC
  Administered 2022-09-12: 0.25 mg via INTRAVENOUS
  Filled 2022-09-12: qty 5

## 2022-09-12 MED ORDER — SODIUM CHLORIDE 0.9 % IV SOLN: Freq: Once | INTRAVENOUS | Status: DC

## 2022-09-12 MED ORDER — OXALIPLATIN CHEMO INJECTION 100 MG/20ML
65.0000 mg/m2 | Freq: Once | INTRAVENOUS | Status: AC
  Administered 2022-09-12: 150 mg via INTRAVENOUS
  Filled 2022-09-12: qty 10

## 2022-09-12 MED ORDER — SODIUM CHLORIDE 0.9 % IV SOLN
150.0000 mg | Freq: Once | INTRAVENOUS | Status: AC
  Administered 2022-09-12: 150 mg via INTRAVENOUS
  Filled 2022-09-12: qty 150

## 2022-09-12 MED ORDER — SODIUM CHLORIDE 0.9 % IV SOLN
100.0000 mg/m2 | Freq: Once | INTRAVENOUS | Status: AC
  Administered 2022-09-12: 220 mg via INTRAVENOUS
  Filled 2022-09-12: qty 6

## 2022-09-12 MED ORDER — SODIUM CHLORIDE 0.9 % IV SOLN
400.0000 mg/m2 | Freq: Once | INTRAVENOUS | Status: AC
  Administered 2022-09-12: 908 mg via INTRAVENOUS
  Filled 2022-09-12: qty 45.4

## 2022-09-12 NOTE — Progress Notes (Signed)
[facility] OFFICE PROGRESS NOTE   Diagnosis: Pancreas cancer  INTERVAL HISTORY:   Jared [name] returns as scheduled.  He completed cycle 3 FOLFIRINOX 08/29/2022.  He was seen in the emergency department 09/06/2022 with diarrhea and vomiting.  He was given IV fluids and antiemetics.  He was able to tolerate liquids.  Jared [name] reports mild nausea and 2 episodes of vomiting in the few days after chemotherapy.  Some loose stools, controlled with Imodium.  No mouth sores.  No cold sensitivity.  He developed acute onset nausea/vomiting/diarrhea on 09/06/2022, ER evaluation as above.  He felt much better following IV fluids.  Objective:  Vital signs in last 24 hours:  Blood pressure 113/67, pulse 68, temperature 98.1 F (36.7 C), temperature source Oral, resp. rate 18, height 6' (1.829 m), weight 223 lb (101.2 kg), SpO2 99 %.    HEENT: No thrush or ulcers. Resp: Lungs clear bilaterally. Cardio: Regular rate and rhythm. GI: Abdomen soft and nontender.  No hepatosplenomegaly. Vascular: No leg edema. Skin: Palms without erythema. Port-A-Cath without erythema.   Lab Results:  Lab Results  Component Value Date   WBC 7.3 09/12/2022   HGB 12.8 (L) 09/12/2022   HCT 38.2 (L) 09/12/2022   MCV 86.4 09/12/2022   PLT 175 09/12/2022   NEUTROABS 4.9 09/12/2022    Imaging:  No results found.  Medications: I have reviewed the patient's current medications.  Assessment/Plan: Pancreas cancer FNA biopsy of a pancreas body/tail mass 07/05/2022-adenocarcinoma CT abdomen/pelvis 06/14/2022-hypoenhancing pancreas body/tail mass with effacement of the splenic vein, no evidence of lymphadenopathy or metastatic disease CA 19-9 on 06/18/2022-767 EUS 07/05/2022-a 7 x 29 mm pancreas body/tail mass, abutment of the splenic artery, no malignant appearing lymph nodes, T2N0 by EUS CTs 07/15/2022-pancreas body/tail mass with no evidence of metastatic disease, splenic vein associated with the tumor, no  arterial involvement, stable right greater than left lung nodules favored benign Cycle 1 FOLFOX 08/01/2022 Cycle 2 FOLFOX, 08/16/2022, neulasta Cycle 3 FOLFIRINOX 08/29/2022, Neulasta Cycle 4 FOLFIRINOX 09/12/2022, Neulasta, irinotecan and 5-fluorouracil dose reduced due to diarrhea/weight loss Prostate cancer, status post prostatectomy 1990 Chronic elevation of the PSA-maintained on Trelstar, followed by Dr. Jeffie Pollock   3.  Kidney stones 4.   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  Abdominal pain, likely secondary to #1 6.  Family history of prostate and pancreas cancer 7.  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  Typhlitis in 2014 9.  Colon polyps-tubular adenomas on colonoscopy 03/27/2022    Disposition: Jared [name] appears stable.  He has completed 3 cycles of systemic therapy.  Irinotecan was added with cycle 3.  Postchemotherapy course complicated by diarrhea status post emergency department evaluation.  He has lost some weight.  Plan to proceed with another cycle of FOLFIRINOX today as scheduled, irinotecan and 5-fluorouracil will be dose reduced due to the diarrhea and weight loss.  He would like to receive IV fluids on day of pump discontinuation.  We will make arrangements for this.  Plan for restaging studies after completing 6 cycles.  CBC and chemistry panel reviewed.  Labs adequate to proceed as above.  He will return for lab, follow-up, cycle 5 FOLFIRINOX in 2 weeks.  He will contact the office in the interim with any problems.  We specifically discussed poorly controlled diarrhea.  Plan reviewed with Dr. [name].  [name] ANP/GNP-BC   09/12/2022  9:17 AM

## 2022-09-12 NOTE — Progress Notes (Signed)
Patient seen by [name] NP today  Vitals are within treatment parameters.  Labs reviewed by [name] NP and are within treatment parameters.  Per physician team, patient is ready for treatment. Please note that modifications are being made to the treatment plan including irinotecan will be dose reduced due to the diarrhea and weight loss

## 2022-09-12 NOTE — Patient Instructions (Addendum)
[place]  The chemotherapy medication bag should finish at 46 hours, 96 hours, or 7 days. For example, if your pump is scheduled for 46 hours and it was put on at 4:00 p.m., it should finish at 2:00 p.m. the day it is scheduled to come off regardless of your appointment time.     Estimated time to finish at 1:15pm Friday September 14, 2022.   If the display on your pump reads "Low Volume" and it is beeping, take the batteries out of the pump and come to the cancer center for it to be taken off.   If the pump alarms go off prior to the pump reading "Low Volume" then call [phone] and someone can assist you.  If the plunger comes out and the chemotherapy medication is leaking out, please use your home chemo spill kit to clean up the spill. Do NOT use paper towels or other household products.  If you have problems or questions regarding your pump, please call either [phone] (24 hours a day) or the cancer center Monday-Friday 8:00 a.m.- 4:30 p.m. at the clinic number and we will assist you. If you are unable to get assistance, then go to the nearest Emergency Department and ask the staff to contact the IV team for assistance.   Discharge Instructions: Thank you for choosing [facility] to provide your oncology and hematology care.   If you have a lab appointment with the [facility], please go directly to the [facility] and check in at the registration area.   Wear comfortable clothing and clothing appropriate for easy access to any Portacath or PICC line.   We strive to give you quality time with your provider. You may need to reschedule your appointment if you arrive late (15 or more minutes).  Arriving late affects you and other patients whose appointments are after yours.  Also, if you miss three or more appointments without notifying the office, you may be dismissed from the clinic at the provider's discretion.      For prescription  refill requests, have your pharmacy contact our office and allow 72 hours for refills to be completed.    Today you received the following chemotherapy and/or immunotherapy agents Oxaliplatin, Irinotecan, Leucovorin, Fluorouracil      To help prevent nausea and vomiting after your treatment, we encourage you to take your nausea medication as directed.  BELOW ARE SYMPTOMS THAT SHOULD BE REPORTED IMMEDIATELY: *FEVER GREATER THAN 100.4 F (38 C) OR HIGHER *CHILLS OR SWEATING *NAUSEA AND VOMITING THAT IS NOT CONTROLLED WITH YOUR NAUSEA MEDICATION *UNUSUAL SHORTNESS OF BREATH *UNUSUAL BRUISING OR BLEEDING *URINARY PROBLEMS (pain or burning when urinating, or frequent urination) *BOWEL PROBLEMS (unusual diarrhea, constipation, pain near the anus) TENDERNESS IN MOUTH AND THROAT WITH OR WITHOUT PRESENCE OF ULCERS (sore throat, sores in mouth, or a toothache) UNUSUAL RASH, SWELLING OR PAIN  UNUSUAL VAGINAL DISCHARGE OR ITCHING   Items with * indicate a potential emergency and should be followed up as soon as possible or go to the Emergency Department if any problems should occur.  Please show the CHEMOTHERAPY ALERT CARD or IMMUNOTHERAPY ALERT CARD at check-in to the Emergency Department and triage nurse.  Should you have questions after your visit or need to cancel or reschedule your appointment, please contact [facility]  Dept: [phone]  and follow the prompts.  Office hours are 8:00 a.m. to 4:30 p.m. Monday - Friday. Please note that  voicemails left after 4:00 p.m. may not be returned until the following business day.  We are closed weekends and major holidays. You have access to a nurse at all times for urgent questions. Please call the main number to the clinic Dept: [phone] and follow the prompts.   For any non-urgent questions, you may also contact your provider using MyChart. We now offer e-Visits for anyone 48 and older to request care online for  non-urgent symptoms. For details visit [email or web address].   Also download the MyChart app! Go to the app store, search "MyChart", open the app, select [facility], and log in with your MyChart username and password.  Masks are optional in the cancer centers. If you would like for your care team to wear a mask while they are taking care of you, please let them know. You may have one support person who is at least 77 years old accompany you for your appointments.  Oxaliplatin Injection What is this medication? OXALIPLATIN (ox AL i PLA tin) treats some types of cancer. It works by slowing down the growth of cancer cells. This medicine may be used for other purposes; ask your health care provider or pharmacist if you have questions. COMMON BRAND NAME(S): Eloxatin What should I tell my care team before I take this medication? They need to know if you have any of these conditions: Heart disease History of irregular heartbeat or rhythm Liver disease Low blood cell levels (white cells, red cells, and platelets) Lung or breathing disease, such as asthma Take medications that treat or prevent blood clots Tingling of the fingers, toes, or other nerve disorder An unusual or allergic reaction to oxaliplatin, other medications, foods, dyes, or preservatives If you or your partner are pregnant or trying to get pregnant Breast-feeding How should I use this medication? This medication is injected into a vein. It is given by your care team in a hospital or clinic setting. Talk to your care team about the use of this medication in children. Special care may be needed. Overdosage: If you think you have taken too much of this medicine contact a poison control center or emergency room at once. NOTE: This medicine is only for you. Do not share this medicine with others. What if I miss a dose? Keep appointments for follow-up doses. It is important not to miss a dose. Call your care team if you are unable  to keep an appointment. What may interact with this medication? Do not take this medication with any of the following: Cisapride Dronedarone Pimozide Thioridazine This medication may also interact with the following: Aspirin and aspirin-like medications Certain medications that treat or prevent blood clots, such as warfarin, apixaban, dabigatran, and rivaroxaban Cisplatin Cyclosporine Diuretics Medications for infection, such as acyclovir, adefovir, amphotericin B, bacitracin, cidofovir, foscarnet, ganciclovir, gentamicin, pentamidine, vancomycin NSAIDs, medications for pain and inflammation, such as ibuprofen or naproxen Other medications that cause heart rhythm changes Pamidronate Zoledronic acid This list may not describe all possible interactions. Give your health care provider a list of all the medicines, herbs, non-prescription drugs, or dietary supplements you use. Also tell them if you smoke, drink alcohol, or use illegal drugs. Some items may interact with your medicine. What should I watch for while using this medication? Your condition will be monitored carefully while you are receiving this medication. You may need blood work while taking this medication. This medication may make you feel generally unwell. This is not uncommon as chemotherapy can affect healthy cells as well  as cancer cells. Report any side effects. Continue your course of treatment even though you feel ill unless your care team tells you to stop. This medication may increase your risk of getting an infection. Call your care team for advice if you get a fever, chills, sore throat, or other symptoms of a cold or flu. Do not treat yourself. Try to avoid being around people who are sick. Avoid taking medications that contain aspirin, acetaminophen, ibuprofen, naproxen, or ketoprofen unless instructed by your care team. These medications may hide a fever. Be careful brushing or flossing your teeth or using a  toothpick because you may get an infection or bleed more easily. If you have any dental work done, tell your dentist you are receiving this medication. This medication can make you more sensitive to cold. Do not drink cold drinks or use ice. Cover exposed skin before coming in contact with cold temperatures or cold objects. When out in cold weather wear warm clothing and cover your mouth and nose to warm the air that goes into your lungs. Tell your care team if you get sensitive to the cold. Talk to your care team if you or your partner are pregnant or think either of you might be pregnant. This medication can cause serious birth defects if taken during pregnancy and for 9 months after the last dose. A negative pregnancy test is required before starting this medication. A reliable form of contraception is recommended while taking this medication and for 9 months after the last dose. Talk to your care team about effective forms of contraception. Do not father a child while taking this medication and for 6 months after the last dose. Use a condom while having sex during this time period. Do not breastfeed while taking this medication and for 3 months after the last dose. This medication may cause infertility. Talk to your care team if you are concerned about your fertility. What side effects may I notice from receiving this medication? Side effects that you should report to your care team as soon as possible: Allergic reactions--skin rash, itching, hives, swelling of the face, lips, tongue, or throat Bleeding--bloody or black, tar-like stools, vomiting blood or brown material that looks like coffee grounds, red or dark brown urine, small red or purple spots on skin, unusual bruising or bleeding Dry cough, shortness of breath or trouble breathing Heart rhythm changes--fast or irregular heartbeat, dizziness, feeling faint or lightheaded, chest pain, trouble breathing Infection--fever, chills, cough, sore  throat, wounds that don't heal, pain or trouble when passing urine, general feeling of discomfort or being unwell Liver injury--right upper belly pain, loss of appetite, nausea, light-colored stool, dark yellow or brown urine, yellowing skin or eyes, unusual weakness or fatigue Low red blood cell level--unusual weakness or fatigue, dizziness, headache, trouble breathing Muscle injury--unusual weakness or fatigue, muscle pain, dark yellow or brown urine, decrease in amount of urine Pain, tingling, or numbness in the hands or feet Sudden and severe headache, confusion, change in vision, seizures, which may be signs of posterior reversible encephalopathy syndrome (PRES) Unusual bruising or bleeding Side effects that usually do not require medical attention (report to your care team if they continue or are bothersome): Diarrhea Nausea Pain, redness, or swelling with sores inside the mouth or throat Unusual weakness or fatigue Vomiting This list may not describe all possible side effects. Call your doctor for medical advice about side effects. You may report side effects to FDA at 1-800-FDA-1088. Where should I keep my  medication? This medication is given in a hospital or clinic. It will not be stored at home. NOTE: This sheet is a summary. It may not cover all possible information. If you have questions about this medicine, talk to your doctor, pharmacist, or health care provider.  2023 Elsevier/Gold Standard (2022-02-09 00:00:00)  Irinotecan Injection What is this medication? IRINOTECAN (ir in oh TEE kan) treats some types of cancer. It works by slowing down the growth of cancer cells. This medicine may be used for other purposes; ask your health care provider or pharmacist if you have questions. COMMON BRAND NAME(S): Camptosar What should I tell my care team before I take this medication? They need to know if you have any of these conditions: Dehydration Diarrhea Infection, especially a  viral infection, such as chickenpox, cold sores, herpes Liver disease Low blood cell levels (white cells, red cells, and platelets) Low levels of electrolytes, such as calcium, magnesium, or potassium in your blood Recent or ongoing radiation An unusual or allergic reaction to irinotecan, other medications, foods, dyes, or preservatives If you or your partner are pregnant or trying to get pregnant Breast-feeding How should I use this medication? This medication is injected into a vein. It is given by your care team in a hospital or clinic setting. Talk to your care team about the use of this medication in children. Special care may be needed. Overdosage: If you think you have taken too much of this medicine contact a poison control center or emergency room at once. NOTE: This medicine is only for you. Do not share this medicine with others. What if I miss a dose? Keep appointments for follow-up doses. It is important not to miss your dose. Call your care team if you are unable to keep an appointment. What may interact with this medication? Do not take this medication with any of the following: Cobicistat Itraconazole This medication may also interact with the following: Certain antibiotics, such as clarithromycin, rifampin, rifabutin Certain antivirals for HIV or AIDS Certain medications for fungal infections, such as ketoconazole, posaconazole, voriconazole Certain medications for seizures, such as carbamazepine, phenobarbital, phenytoin Gemfibrozil Nefazodone St. John's wort This list may not describe all possible interactions. Give your health care provider a list of all the medicines, herbs, non-prescription drugs, or dietary supplements you use. Also tell them if you smoke, drink alcohol, or use illegal drugs. Some items may interact with your medicine. What should I watch for while using this medication? Your condition will be monitored carefully while you are receiving this  medication. You may need blood work while taking this medication. This medication may make you feel generally unwell. This is not uncommon as chemotherapy can affect healthy cells as well as cancer cells. Report any side effects. Continue your course of treatment even though you feel ill unless your care team tells you to stop. This medication can cause serious side effects. To reduce the risk, your care team may give you other medications to take before receiving this one. Be sure to follow the directions from your care team. This medication may affect your coordination, reaction time, or judgement. Do not drive or operate machinery until you know how this medication affects you. Sit up or stand slowly to reduce the risk of dizzy or fainting spells. Drinking alcohol with this medication can increase the risk of these side effects. This medication may increase your risk of getting an infection. Call your care team for advice if you get a fever, chills, sore  throat, or other symptoms of a cold or flu. Do not treat yourself. Try to avoid being around people who are sick. Avoid taking medications that contain aspirin, acetaminophen, ibuprofen, naproxen, or ketoprofen unless instructed by your care team. These medications may hide a fever. This medication may increase your risk to bruise or bleed. Call your care team if you notice any unusual bleeding. Be careful brushing or flossing your teeth or using a toothpick because you may get an infection or bleed more easily. If you have any dental work done, tell your dentist you are receiving this medication. Talk to your care team if you or your partner are pregnant or think either of you might be pregnant. This medication can cause serious birth defects if taken during pregnancy and for 6 months after the last dose. You will need a negative pregnancy test before starting this medication. Contraception is recommended while taking this medication and for 6 months  after the last dose. Your care team can help you find the option that works for you. Do not father a child while taking this medication and for 3 months after the last dose. Use a condom for contraception during this time period. Do not breastfeed while taking this medication and for 7 days after the last dose. This medication may cause infertility. Talk to your care team if you are concerned about your fertility. What side effects may I notice from receiving this medication? Side effects that you should report to your care team as soon as possible: Allergic reactions--skin rash, itching, hives, swelling of the face, lips, tongue, or throat Dry cough, shortness of breath or trouble breathing Increased saliva or tears, increased sweating, stomach cramping, diarrhea, small pupils, unusual weakness or fatigue, slow heartbeat Infection--fever, chills, cough, sore throat, wounds that don't heal, pain or trouble when passing urine, general feeling of discomfort or being unwell Kidney injury--decrease in the amount of urine, swelling of the ankles, hands, or feet Low red blood cell level--unusual weakness or fatigue, dizziness, headache, trouble breathing Severe or prolonged diarrhea Unusual bruising or bleeding Side effects that usually do not require medical attention (report to your care team if they continue or are bothersome): Constipation Diarrhea Hair loss Loss of appetite Nausea Stomach pain This list may not describe all possible side effects. Call your doctor for medical advice about side effects. You may report side effects to FDA at 1-800-FDA-1088. Where should I keep my medication? This medication is given in a hospital or clinic. It will not be stored at home. NOTE: This sheet is a summary. It may not cover all possible information. If you have questions about this medicine, talk to your doctor, pharmacist, or health care provider.  2023 Elsevier/Gold Standard (2022-02-22  00:00:00)  Leucovorin Injection What is this medication? LEUCOVORIN (loo koe VOR in) prevents side effects from certain medications, such as methotrexate. It works by increasing folate levels. This helps protect healthy cells in your body. It may also be used to treat anemia caused by low levels of folate. It can also be used with fluorouracil, a type of chemotherapy, to treat colorectal cancer. It works by increasing the effects of fluorouracil in the body. This medicine may be used for other purposes; ask your health care provider or pharmacist if you have questions. What should I tell my care team before I take this medication? They need to know if you have any of these conditions: Anemia from low levels of vitamin B12 in the blood An unusual  or allergic reaction to leucovorin, folic acid, other medications, foods, dyes, or preservatives Pregnant or trying to get pregnant Breastfeeding How should I use this medication? This medication is injected into a vein or a muscle. It is given by your care team in a hospital or clinic setting. Talk to your care team about the use of this medication in children. Special care may be needed. Overdosage: If you think you have taken too much of this medicine contact a poison control center or emergency room at once. NOTE: This medicine is only for you. Do not share this medicine with others. What if I miss a dose? Keep appointments for follow-up doses. It is important not to miss your dose. Call your care team if you are unable to keep an appointment. What may interact with this medication? Capecitabine Fluorouracil Phenobarbital Phenytoin Primidone Trimethoprim;sulfamethoxazole This list may not describe all possible interactions. Give your health care provider a list of all the medicines, herbs, non-prescription drugs, or dietary supplements you use. Also tell them if you smoke, drink alcohol, or use illegal drugs. Some items may interact with your  medicine. What should I watch for while using this medication? Your condition will be monitored carefully while you are receiving this medication. This medication may increase the side effects of 5-fluorouracil. Tell your care team if you have diarrhea or mouth sores that do not get better or that get worse. What side effects may I notice from receiving this medication? Side effects that you should report to your care team as soon as possible: Allergic reactions--skin rash, itching, hives, swelling of the face, lips, tongue, or throat This list may not describe all possible side effects. Call your doctor for medical advice about side effects. You may report side effects to FDA at 1-800-FDA-1088. Where should I keep my medication? This medication is given in a hospital or clinic. It will not be stored at home. NOTE: This sheet is a summary. It may not cover all possible information. If you have questions about this medicine, talk to your doctor, pharmacist, or health care provider.  2023 Elsevier/Gold Standard (2022-02-23 00:00:00)  Fluorouracil Injection What is this medication? FLUOROURACIL (flure oh YOOR a sil) treats some types of cancer. It works by slowing down the growth of cancer cells. This medicine may be used for other purposes; ask your health care provider or pharmacist if you have questions. COMMON BRAND NAME(S): Adrucil What should I tell my care team before I take this medication? They need to know if you have any of these conditions: Blood disorders Dihydropyrimidine dehydrogenase (DPD) deficiency Infection, such as chickenpox, cold sores, herpes Kidney disease Liver disease Poor nutrition Recent or ongoing radiation therapy An unusual or allergic reaction to fluorouracil, other medications, foods, dyes, or preservatives If you or your partner are pregnant or trying to get pregnant Breast-feeding How should I use this medication? This medication is injected into a  vein. It is administered by your care team in a hospital or clinic setting. Talk to your care team about the use of this medication in children. Special care may be needed. Overdosage: If you think you have taken too much of this medicine contact a poison control center or emergency room at once. NOTE: This medicine is only for you. Do not share this medicine with others. What if I miss a dose? Keep appointments for follow-up doses. It is important not to miss your dose. Call your care team if you are unable to keep an appointment.  What may interact with this medication? Do not take this medication with any of the following: Live virus vaccines This medication may also interact with the following: Medications that treat or prevent blood clots, such as warfarin, enoxaparin, dalteparin This list may not describe all possible interactions. Give your health care provider a list of all the medicines, herbs, non-prescription drugs, or dietary supplements you use. Also tell them if you smoke, drink alcohol, or use illegal drugs. Some items may interact with your medicine. What should I watch for while using this medication? Your condition will be monitored carefully while you are receiving this medication. This medication may make you feel generally unwell. This is not uncommon as chemotherapy can affect healthy cells as well as cancer cells. Report any side effects. Continue your course of treatment even though you feel ill unless your care team tells you to stop. In some cases, you may be given additional medications to help with side effects. Follow all directions for their use. This medication may increase your risk of getting an infection. Call your care team for advice if you get a fever, chills, sore throat, or other symptoms of a cold or flu. Do not treat yourself. Try to avoid being around people who are sick. This medication may increase your risk to bruise or bleed. Call your care team if you  notice any unusual bleeding. Be careful brushing or flossing your teeth or using a toothpick because you may get an infection or bleed more easily. If you have any dental work done, tell your dentist you are receiving this medication. Avoid taking medications that contain aspirin, acetaminophen, ibuprofen, naproxen, or ketoprofen unless instructed by your care team. These medications may hide a fever. Do not treat diarrhea with over the counter products. Contact your care team if you have diarrhea that lasts more than 2 days or if it is severe and watery. This medication can make you more sensitive to the sun. Keep out of the sun. If you cannot avoid being in the sun, wear protective clothing and sunscreen. Do not use sun lamps, tanning beds, or tanning booths. Talk to your care team if you or your partner wish to become pregnant or think you might be pregnant. This medication can cause serious birth defects if taken during pregnancy and for 3 months after the last dose. A reliable form of contraception is recommended while taking this medication and for 3 months after the last dose. Talk to your care team about effective forms of contraception. Do not father a child while taking this medication and for 3 months after the last dose. Use a condom while having sex during this time period. Do not breastfeed while taking this medication. This medication may cause infertility. Talk to your care team if you are concerned about your fertility. What side effects may I notice from receiving this medication? Side effects that you should report to your care team as soon as possible: Allergic reactions--skin rash, itching, hives, swelling of the face, lips, tongue, or throat Heart attack--pain or tightness in the chest, shoulders, arms, or jaw, nausea, shortness of breath, cold or clammy skin, feeling faint or lightheaded Heart failure--shortness of breath, swelling of the ankles, feet, or hands, sudden weight gain,  unusual weakness or fatigue Heart rhythm changes--fast or irregular heartbeat, dizziness, feeling faint or lightheaded, chest pain, trouble breathing High ammonia level--unusual weakness or fatigue, confusion, loss of appetite, nausea, vomiting, seizures Infection--fever, chills, cough, sore throat, wounds that don't heal, pain  or trouble when passing urine, general feeling of discomfort or being unwell Low red blood cell level--unusual weakness or fatigue, dizziness, headache, trouble breathing Pain, tingling, or numbness in the hands or feet, muscle weakness, change in vision, confusion or trouble speaking, loss of balance or coordination, trouble walking, seizures Redness, swelling, and blistering of the skin over hands and feet Severe or prolonged diarrhea Unusual bruising or bleeding Side effects that usually do not require medical attention (report to your care team if they continue or are bothersome): Dry skin Headache Increased tears Nausea Pain, redness, or swelling with sores inside the mouth or throat Sensitivity to light Vomiting This list may not describe all possible side effects. Call your doctor for medical advice about side effects. You may report side effects to FDA at 1-800-FDA-1088. Where should I keep my medication? This medication is given in a hospital or clinic. It will not be stored at home. NOTE: This sheet is a summary. It may not cover all possible information. If you have questions about this medicine, talk to your doctor, pharmacist, or health care provider.  2023 Elsevier/Gold Standard (2022-02-20 00:00:00)

## 2022-09-12 NOTE — Patient Instructions (Signed)

## 2022-09-13 ENCOUNTER — Other Ambulatory Visit: Payer: Self-pay

## 2022-09-13 ENCOUNTER — Encounter: Payer: Medicare HMO | Admitting: Genetic Counselor

## 2022-09-13 ENCOUNTER — Other Ambulatory Visit: Payer: Medicare HMO

## 2022-09-13 LAB — CANCER ANTIGEN 19-9: CA 19-9: 757 U/mL — ABNORMAL HIGH (ref 0–35)

## 2022-09-14 ENCOUNTER — Inpatient Hospital Stay: Payer: Medicare HMO

## 2022-09-14 ENCOUNTER — Encounter (HOSPITAL_COMMUNITY): Payer: Self-pay | Admitting: General Surgery

## 2022-09-14 VITALS — BP 104/76 | HR 60 | Temp 98.1°F | Resp 20

## 2022-09-14 DIAGNOSIS — C251 Malignant neoplasm of body of pancreas: Secondary | ICD-10-CM

## 2022-09-14 DIAGNOSIS — Z5111 Encounter for antineoplastic chemotherapy: Secondary | ICD-10-CM | POA: Diagnosis not present

## 2022-09-14 MED ORDER — SODIUM CHLORIDE 0.9% FLUSH
10.0000 mL | INTRAVENOUS | Status: DC | PRN
  Administered 2022-09-14: 10 mL

## 2022-09-14 MED ORDER — PEGFILGRASTIM INJECTION 6 MG/0.6ML ~~LOC~~
6.0000 mg | PREFILLED_SYRINGE | Freq: Once | SUBCUTANEOUS | Status: AC
  Administered 2022-09-14: 6 mg via SUBCUTANEOUS
  Filled 2022-09-14: qty 0.6

## 2022-09-14 MED ORDER — SODIUM CHLORIDE 0.9 % IV SOLN: Freq: Once | INTRAVENOUS | Status: AC

## 2022-09-14 MED ORDER — HEPARIN SOD (PORK) LOCK FLUSH 100 UNIT/ML IV SOLN
500.0000 [IU] | Freq: Once | INTRAVENOUS | Status: AC | PRN
  Administered 2022-09-14: 500 [IU]

## 2022-09-14 NOTE — Patient Instructions (Signed)
Rehydration, Older Adult  Rehydration is the replacement of fluids, salts, and minerals in the body (electrolytes) that are lost during dehydration. Dehydration is when there is not enough water or other fluids in the body. This happens when you lose more fluids than you take in. People who are age 77 or older have a higher risk of dehydration than younger adults. This is because in older age, the body: Is less able to maintain the right amount of water. Does not respond to temperature changes as well. Does not get a sense of thirst as easily or quickly. Other causes include: Not drinking enough fluids. This can occur when you are ill, when you forget to drink, or when you are doing activities that require a lot of energy, especially in hot weather. Conditions that cause loss of water or other fluids. These include diarrhea, vomiting, sweating, or urinating a lot. Other illnesses, such as fever or infection. Certain medicines, such as those that remove excess fluid from the body (diuretics). Symptoms of mild or moderate dehydration may include thirst, dry lips and mouth, and dizziness. Symptoms of severe dehydration may include increased heart rate, confusion, fainting, and not urinating. In severe cases, you may need to get fluids through an IV at the hospital. For mild or moderate cases, you can usually rehydrate at home by drinking certain fluids as told by your health care provider. What are the risks? Rehydration is usually safe. Taking in too much fluid (overhydration) can be a problem but is rare. Overhydration can cause an imbalance of electrolytes in the body, kidney failure, fluid in the lungs, or a decrease in salt (sodium) levels in the body. Supplies needed: You will need an oral rehydration solution (ORS) if your health care provider tells you to use one. This is a drink to treat dehydration. It can be found in pharmacies and retail stores. How to rehydrate Fluids Follow  instructions from your health care provider about what to drink. The kind of fluid and the amount you should drink depend on your condition. In general, you should choose drinks that you prefer. If told by your health care provider, drink an ORS. Make an ORS by following instructions on the package. Start by drinking small amounts, about  cup (120 mL) every 5-10 minutes. Slowly increase how much you drink until you have taken in the amount recommended by your health care provider. Drink enough clear fluids to keep your urine pale yellow. If you were told to drink an ORS, finish it first, then start slowly drinking other clear fluids. Drink fluids such as: Water. This includes sparkling and flavored water. Drinking only water can lead to having too little sodium in your body (hyponatremia). Follow the advice of your health care provider. Water from ice chips you suck on. Fruit juice with water added to it(diluted). Sports drinks. Hot or cold herbal teas. Broth-based soups. Coffee. Milk or milk products. Food Follow instructions from your health care provider about what to eat while you rehydrate. Your health care provider may recommend that you slowly begin eating regular foods in small amounts. Eat foods that contain a healthy balance of electrolytes, such as bananas, oranges, potatoes, tomatoes, and spinach. Avoid foods that are greasy or contain a lot of sugar. In some cases, you may get nutrition through a feeding tube that is passed through your nose and into your stomach (nasogastric tube, or NG tube). This may be done if you have uncontrolled vomiting or diarrhea. Drinks to avoid  Certain drinks may make dehydration worse. While you rehydrate, avoid drinking alcohol. How to tell if you are recovering from dehydration You may be getting better if: You are urinating more often than before you started rehydrating. Your urine is pale yellow. Your energy level improves. You vomit less  often. You have diarrhea less often. Your appetite improves or returns to normal. You feel less dizzy or light-headed. Your skin tone and color start to look more normal. Follow these instructions at home: Take over-the-counter and prescription medicines only as told by your health care provider. Do not take sodium tablets. Doing this can lead to having too much sodium in your body (hypernatremia). Contact a health care provider if: You continue to have symptoms of mild or moderate dehydration, such as: Thirst. Dry lips. Slightly dry mouth. Dizziness. Dark urine or less urine than usual. Muscle cramps. You continue to vomit or have diarrhea. Get help right away if: You have symptoms of dehydration that get worse. You have a fever. You have a severe headache. You have been vomiting and have problems, such as: Your vomiting gets worse. Your vomit includes blood or green matter (bile). You cannot eat or drink without vomiting. You have problems with urination or bowel movements, such as: Diarrhea that gets worse. Blood in your stool (feces). This may cause stool to look black and tarry. Not urinating, or urinating only a small amount of very dark urine, within 6-8 hours. You have trouble breathing. You have symptoms that get worse with treatment. These symptoms may be an emergency. Get help right away. Call 911. Do not wait to see if the symptoms will go away. Do not drive yourself to the hospital. This information is not intended to replace advice given to you by your health care provider. Make sure you discuss any questions you have with your health care provider. Document Revised: 02/28/2022 Document Reviewed: 02/26/2022 Elsevier Patient Education  [place].  Pegfilgrastim Injection What is this medication? PEGFILGRASTIM (PEG fil gra stim) lowers the risk of infection in people who are receiving chemotherapy. It works by Building control surveyor make more white blood cells,  which protects your body from infection. It may also be used to help people who have been exposed to high doses of radiation. This medicine may be used for other purposes; ask your health care provider or pharmacist if you have questions. COMMON BRAND NAME(S): [place] [employer], Neulasta, Nyvepria, Stimufend, UDENYCA, Ziextenzo What should I tell my care team before I take this medication? They need to know if you have any of these conditions: Kidney disease Latex allergy Ongoing radiation therapy Sickle cell disease Skin reactions to acrylic adhesives (On-Body Injector only) An unusual or allergic reaction to pegfilgrastim, filgrastim, other medications, foods, dyes, or preservatives Pregnant or trying to get pregnant Breast-feeding How should I use this medication? This medication is for injection under the skin. If you get this medication at home, you will be taught how to prepare and give the pre-filled syringe or how to use the On-body Injector. Refer to the patient Instructions for Use for detailed instructions. Use exactly as directed. Tell your care team immediately if you suspect that the On-body Injector may not have performed as intended or if you suspect the use of the On-body Injector resulted in a missed or partial dose. It is important that you put your used needles and syringes in a special sharps container. Do not put them in a trash can. If you do not have  a sharps container, call your pharmacist or care team to get one. Talk to your care team about the use of this medication in children. While this medication may be prescribed for selected conditions, precautions do apply. Overdosage: If you think you have taken too much of this medicine contact a poison control center or emergency room at once. NOTE: This medicine is only for you. Do not share this medicine with others. What if I miss a dose? It is important not to miss your dose. Call your care team if you miss your dose. If  you miss a dose due to an On-body Injector failure or leakage, a new dose should be administered as soon as possible using a single prefilled syringe for manual use. What may interact with this medication? Interactions have not been studied. This list may not describe all possible interactions. Give your health care provider a list of all the medicines, herbs, non-prescription drugs, or dietary supplements you use. Also tell them if you smoke, drink alcohol, or use illegal drugs. Some items may interact with your medicine. What should I watch for while using this medication? Your condition will be monitored carefully while you are receiving this medication. You may need blood work done while you are taking this medication. Talk to your care team about your risk of cancer. You may be more at risk for certain types of cancer if you take this medication. If you are going to need a MRI, CT scan, or other procedure, tell your care team that you are using this medication (On-Body Injector only). What side effects may I notice from receiving this medication? Side effects that you should report to your care team as soon as possible: Allergic reactions--skin rash, itching, hives, swelling of the face, lips, tongue, or throat Capillary leak syndrome--stomach or muscle pain, unusual weakness or fatigue, feeling faint or lightheaded, decrease in the amount of urine, swelling of the ankles, hands, or feet, trouble breathing High white blood cell level--fever, fatigue, trouble breathing, night sweats, change in vision, weight loss Inflammation of the aorta--fever, fatigue, back, chest, or stomach pain, severe headache Kidney injury (glomerulonephritis)--decrease in the amount of urine, red or dark brown urine, foamy or bubbly urine, swelling of the ankles, hands, or feet Shortness of breath or trouble breathing Spleen injury--pain in upper left stomach or shoulder Unusual bruising or bleeding Side effects that  usually do not require medical attention (report to your care team if they continue or are bothersome): Bone pain Pain in the hands or feet This list may not describe all possible side effects. Call your doctor for medical advice about side effects. You may report side effects to FDA at 1-800-FDA-1088. Where should I keep my medication? Keep out of the reach of children. If you are using this medication at home, you will be instructed on how to store it. Throw away any unused medication after the expiration date on the label. NOTE: This sheet is a summary. It may not cover all possible information. If you have questions about this medicine, talk to your doctor, pharmacist, or health care provider.  2023 Elsevier/Gold Standard (2021-05-04 00:00:00)  Implanted [facility] Guide An implanted port is a device that is placed under the skin. It is usually placed in the chest. The device may vary based on the need. Implanted ports can be used to give IV medicine, to take blood, or to give fluids. You may have an implanted port if: You need IV medicine that would be  irritating to the small veins in your hands or arms. You need IV medicines, such as chemotherapy, for a long period of time. You need IV nutrition for a long period of time. You may have fewer limitations when using a port than you would if you used other types of long-term IVs. You will also likely be able to return to normal activities after your incision heals. An implanted port has two main parts: Reservoir. The reservoir is the part where a needle is inserted to give medicines or draw blood. The reservoir is round. After the port is placed, it appears as a small, raised area under your skin. Catheter. The catheter is a small, thin tube that connects the reservoir to a vein. Medicine that is inserted into the reservoir goes into the catheter and then into the vein. How is my port accessed? To access your port: A numbing cream may be  placed on the skin over the port site. Your health care provider will put on a mask and sterile gloves. The skin over your port will be cleaned carefully with a germ-killing soap and allowed to dry. Your health care provider will gently pinch the port and insert a needle into it. Your health care provider will check for a blood return to make sure the port is in the vein and is still working (patent). If your port needs to remain accessed to get medicine continuously (constant infusion), your health care provider will place a clear bandage (dressing) over the needle site. The dressing and needle will need to be changed every week, or as told by your health care provider. What is flushing? Flushing helps keep the port working. Follow instructions from your health care provider about how and when to flush the port. Ports are usually flushed with saline solution or a medicine called heparin. The need for flushing will depend on how the port is used: If the port is only used from time to time to give medicines or draw blood, the port may need to be flushed: Before and after medicines have been given. Before and after blood has been drawn. As part of routine maintenance. Flushing may be recommended every 4-6 weeks. If a constant infusion is running, the port may not need to be flushed. Throw away any syringes in a disposal container that is meant for sharp items (sharps container). You can buy a sharps container from a pharmacy, or you can make one by using an empty hard plastic bottle with a cover. How long will my port stay implanted? The port can stay in for as long as your health care provider thinks it is needed. When it is time for the port to come out, a surgery will be done to remove it. The surgery will be similar to the procedure that was done to put the port in. Follow these instructions at home: Caring for your port and port site Flush your port as told by your health care provider. If you  need an infusion over several days, follow instructions from your health care provider about how to take care of your port site. Make sure you: Change your dressing as told by your health care provider. Wash your hands with soap and water for at least 20 seconds before and after you change your dressing. If soap and water are not available, use alcohol-based hand sanitizer. Place any used dressings or infusion bags into a plastic bag. Throw that bag in the trash. Keep the dressing that covers  the needle clean and dry. Do not get it wet. Do not use scissors or sharp objects near the infusion tubing. Keep any external tubes clamped, unless they are being used. Check your port site every day for signs of infection. Check for: Redness, swelling, or pain. Fluid or blood. Warmth. Pus or a bad smell. Protect the skin around the port site. Avoid wearing bra straps that rub or irritate the site. Protect the skin around your port from seat belts. Place a soft pad over your chest if needed. Bathe or shower as told by your health care provider. The site may get wet as long as you are not actively receiving an infusion. General instructions  Return to your normal activities as told by your health care provider. Ask your health care provider what activities are safe for you. Carry a medical alert card or wear a medical alert bracelet at all times. This will let health care providers know that you have an implanted port in case of an emergency. Where to find more information American Cancer Society: www.cancer.[place] of Clinical Oncology: www.cancer.net Contact a health care provider if: You have a fever or chills. You have redness, swelling, or pain at the port site. You have fluid or blood coming from your port site. Your incision feels warm to the touch. You have pus or a bad smell coming from the port site. Summary Implanted ports are usually placed in the chest for long-term IV  access. Follow instructions from your health care provider about flushing the port and changing bandages (dressings). Take care of the area around your port by avoiding clothing that puts pressure on the area, and by watching for signs of infection. Protect the skin around your port from seat belts. Place a soft pad over your chest if needed. Contact a health care provider if you have a fever or you have redness, swelling, pain, fluid, or a bad smell at the port site. This information is not intended to replace advice given to you by your health care provider. Make sure you discuss any questions you have with your health care provider. Document Revised: 04/18/2021 Document Reviewed: 04/18/2021 Elsevier Patient Education  [place].

## 2022-09-17 ENCOUNTER — Other Ambulatory Visit: Payer: Self-pay | Admitting: *Deleted

## 2022-09-17 DIAGNOSIS — C251 Malignant neoplasm of body of pancreas: Secondary | ICD-10-CM

## 2022-09-18 ENCOUNTER — Encounter: Payer: Self-pay | Admitting: Family Medicine

## 2022-09-18 ENCOUNTER — Ambulatory Visit (INDEPENDENT_AMBULATORY_CARE_PROVIDER_SITE_OTHER): Payer: Medicare HMO | Admitting: Family Medicine

## 2022-09-18 VITALS — BP 129/90 | HR 80 | Ht 72.0 in | Wt 225.0 lb

## 2022-09-18 DIAGNOSIS — Z13220 Encounter for screening for lipoid disorders: Secondary | ICD-10-CM | POA: Diagnosis not present

## 2022-09-18 DIAGNOSIS — Z1159 Encounter for screening for other viral diseases: Secondary | ICD-10-CM

## 2022-09-18 NOTE — Progress Notes (Unsigned)
Subjective:   Jared [name] is a 76 y.o. male who presents for Medicare Annual/Subsequent preventive examination.  Overall feels well  Undergoing chemo for pancreatic cancer by Dr Ammie Dalton with several more courses planned then for surgery with Dr Allyn Kenner episodes of GI symptoms.  Other wise continues to eat fairly well and walk regularly    Objective:    Vitals: BP (!) 129/90   Pulse 80   Wt 225 lb (102.1 kg)   SpO2 100%   BMI 30.52 kg/m   Body mass index is 30.52 kg/m.  Heart - Regular rate and rhythm.  No murmurs, gallops or rubs.    Lungs:  Normal respiratory effort, chest expands symmetrically. Lungs are clear to auscultation, no crackles or wheezes. Abdomen: soft and non-tender without masses, organomegaly or hernias noted.  No guarding or rebound Extremities:  No cyanosis, edema, or deformity noted with good range of motion of all major joints.   Mobility:able to get up and down from exam table without assistance or distress Ears:  External ear exam shows no significant lesions or deformities.  Otoscopic examination reveals clear canals, tympanic membranes are intact bilaterally without bulging, retraction, inflammation or discharge. Hearing is grossly normal bilaterall      09/12/2022    9:16 AM 08/29/2022   11:07 AM 08/23/2022    1:47 PM 08/03/2022   12:58 PM 08/01/2022   10:20 AM 07/26/2022    8:16 AM 07/17/2022    1:50 PM  Advanced Directives  Does Patient Have a Medical Advance Directive? _0  Yes Yes  Type of Paramedic of [place];Living will [place];Living will Living will;Healthcare Power of [place];Living will Living will;Healthcare Power of Attorney  Does patient want to make changes to medical advance directive? No - Patient declined No - Patient declined  No - Patient declined No - Patient declined No - Patient declined No - Patient declined  Copy of  [place] in Chart? No - copy requested Yes - validated most recent copy scanned in chart (See row information) No - copy requested   Yes - validated most recent copy scanned in chart (See row information) Yes - validated most recent copy scanned in chart (See row information)    Tobacco Social History   Tobacco Use  Smoking Status Former   Packs/day: 0.50   Years: 20.00   Total pack years: 10.00   Types: Cigarettes   Quit date: 10/29/1981   Years since quitting: 40.9  Smokeless Tobacco Never     Counseling given: Not Answered      Past Medical History:  Diagnosis Date   Arthritis    SHOULDER & KNEES   Cataract    bilateral sx   Chronic kidney disease    pt states he does not have kidney disease, just a hx of kidney stones   History of kidney stones    Inguinal hernia    right   Pancreatic cancer ([place]) 2023   Prostate cancer ([place]) 10/29/1988   Past Surgical History:  Procedure Laterality Date   CATARACT EXTRACTION Bilateral    CHOLECYSTECTOMY N/A 07/14/2013   Procedure: LAPAROSCOPIC CHOLECYSTECTOMY;  Surgeon: Stark Klein, MD;  Location: [facility];  Service: General;  Laterality: N/A;   COLONOSCOPY  01/07/2004   Dr. Silvano Rusk   COLONOSCOPY  02/2022   CG-MAC-mira(good)-tics-2 polyps   ENDOSCOPIC RETROGRADE CHOLANGIOPANCREATOGRAPHY (ERCP) WITH PROPOFOL N/A 08/23/2022  Procedure: ENDOSCOPIC RETROGRADE CHOLANGIOPANCREATOGRAPHY (ERCP) WITH PROPOFOL;  Surgeon: Rush Landmark Telford Nab., MD;  Location: [facility];  Service: Gastroenterology;  Laterality: N/A;   ESOPHAGOGASTRODUODENOSCOPY (EGD) WITH PROPOFOL N/A 07/05/2022   Procedure: ESOPHAGOGASTRODUODENOSCOPY (EGD) WITH PROPOFOL;  Surgeon: Rush Landmark Telford Nab., MD;  Location: [facility];  Service: Gastroenterology;  Laterality: N/A;   EUS N/A 07/05/2022   Procedure: UPPER ENDOSCOPIC ULTRASOUND (EUS) RADIAL;  Surgeon: Irving Copas., MD;  Location: [facility];  Service: Gastroenterology;   Laterality: N/A;   EYE SURGERY     FINE NEEDLE ASPIRATION  07/05/2022   Procedure: FINE NEEDLE ASPIRATION (FNA) LINEAR;  Surgeon: Irving Copas., MD;  Location: [facility];  Service: Gastroenterology;;   INGUINAL HERNIA REPAIR Right    PORTACATH PLACEMENT Left 07/31/2022   Procedure: INSERTION PORT-A-CATH;  Surgeon: Stark Klein, MD;  Location: [facility];  Service: General;  Laterality: Left;   PROSTATECTOMY     REMOVAL OF STONES  08/23/2022   Procedure: REMOVAL OF STONES;  Surgeon: Irving Copas., MD;  Location: [facility];  Service: Gastroenterology;;   Joan Mayans  08/23/2022   Procedure: Joan Mayans;  Surgeon: Mansouraty, Telford Nab., MD;  Location: [facility];  Service: Gastroenterology;;   TONSILLECTOMY     Family History  Problem Relation Age of Onset   Pancreatic cancer Mother    Kidney cancer Mother    Prostate cancer Father    Prostate cancer Brother    Prostate cancer Paternal Uncle    Prostate cancer Paternal Uncle    Prostate cancer Paternal Uncle    Prostate cancer Paternal Uncle    Prostate cancer Paternal Grandfather    Colon cancer Neg Hx    Stomach cancer Neg Hx    Esophageal cancer Neg Hx    Colon polyps Neg Hx    Rectal cancer Neg Hx    Social History   Socioeconomic History   Marital status: Married    Spouse name: Not on file   Number of children: 1   Years of education: Not on file   Highest education level: Not on file  Occupational History   Occupation: retired  Tobacco Use   Smoking status: Former    Packs/day: 0.50    Years: 20.00    Total pack years: 10.00    Types: Cigarettes    Quit date: 10/29/1981    Years since quitting: 40.9   Smokeless tobacco: Never  Vaping Use   Vaping Use: Never used  Substance and Sexual Activity   Alcohol use: No   Drug use: No   Sexual activity: Yes  Other Topics Concern   Not on file  Social History Narrative   Married to [place]. 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            Social Determinants of Health   Financial Resource Strain: Low Risk  (07/26/2022)   Overall Financial Resource Strain (CARDIA)    Difficulty of Paying Living Expenses: Not hard at all  Food Insecurity: No Food Insecurity (07/26/2022)   Hunger Vital Sign    Worried About Running Out of Food in the Last Year: Never true    Ran Out of Food in the Last Year: Never true  Transportation Needs: No Transportation Needs (07/26/2022)   PRAPARE - Hydrologist (Medical): No    Lack of Transportation (Non-Medical): No  Physical Activity: Not on file  Stress: Not on  file  Social Connections: Socially Integrated (07/26/2022)   Social Connection and Isolation Panel [NHANES]    Frequency of Communication with Friends and Family: More than three times a week    Frequency of Social Gatherings with Friends and Family: More than three times a week    Attends Religious Services: More than 4 times per year    Active Member of [employer] or Organizations: Yes    Attends Archivist Meetings: More than 4 times per year    Marital Status: Married    Outpatient Encounter Medications as of 09/18/2022  Medication Sig   acetaminophen (TYLENOL) 325 MG tablet Take 325 mg by mouth daily as needed for headache. (Patient not taking: Reported on 08/29/2022)   Ascorbic Acid (VITAMIN C) 500 MG tablet Take 500 mg by mouth daily.     brimonidine (ALPHAGAN) 0.15 % ophthalmic solution Place 1 drop into the left eye in the morning and at bedtime.   Calcium Carbonate Antacid (TUMS ULTRA 1000 PO) Take 2,000-3,000 mg by mouth See admin instructions. 3000 mg by mouth in the morning and 2000 mg by mouth at night   Carboxymethylcell-Glycerin PF (REFRESH OPTIVE PF) 0.5-0.9 % SOLN Place 1 drop into both eyes as needed (dry eyes).   cholecalciferol (VITAMIN D3) 25 MCG (1000 UNIT) tablet Take 1,000 Units by mouth daily.    Dorzolamide HCl-Timolol Mal PF 2-0.5 % SOLN Place 1 drop into the left eye 2 (two) times daily.   Garlic 7026 MG CAPS Take 1,000 mg by mouth daily.   GEMTESA 75 MG TABS Take 75 mg by mouth daily.   HYDROmorphone (DILAUDID) 4 MG tablet Take 1 tablet (4 mg total) by mouth every 4 (four) hours as needed for severe pain.   ibuprofen (ADVIL,MOTRIN) 800 MG tablet Take 1 tablet (800 mg total) by mouth every 8 (eight) hours as needed. (Patient not taking: Reported on 08/29/2022)   lidocaine-prilocaine (EMLA) cream Apply 1 Application topically as needed.   olopatadine (PATANOL) 0.1 % ophthalmic solution Place 1 drop into both eyes as needed for allergies. (Patient not taking: Reported on 08/29/2022)   ondansetron (ZOFRAN) 8 MG tablet Take 1 tablet (8 mg total) by mouth every 8 (eight) hours as needed for nausea or vomiting (Starting day 3 after chemo as needed for nausea(received Aloxi with chemo)).   prochlorperazine (COMPAZINE) 10 MG tablet Take 1 tablet (10 mg total) by mouth every 6 (six) hours as needed for nausea or vomiting.   sodium chloride (AYR) 0.65 % nasal spray Place 2-3 sprays into the nose as needed for congestion.   Triptorelin Pamoate 11.25 MG SUSR Inject 11.25 mg into the muscle as needed (PSA).   No facility-administered encounter medications on file as of 09/18/2022.    Activities of Daily Living Able to all ADLs  Patient Care Team: [name], MD as PCP - Dorisann Frames, MD (Urology) Algie Coffer, Mindi Slicker, RN as Oncology Nurse Navigator   Assessment:   This is a routine wellness examination for [name].  Exercise Activities and Dietary recommendations  Continue diet and regular exercise as able    Goals   None     Fall Risk    09/23/2019   10:59 AM 09/16/2019    8:37 AM 02/03/2018   10:16 AM 09/04/2017    8:30 AM 08/29/2016    8:39 AM  Fall Risk   Falls in the past year? 0 0 No No No  Comment Emmi Telephone Survey: data to providers prior to  load       Number falls in past yr:  0     Depression Screen    08/30/2020    8:49 AM 09/16/2019    8:37 AM 09/09/2018    8:46 AM 02/03/2018   10:16 AM  PHQ 2/9 Scores  PHQ - 2 Score 0 0 0 0  PHQ- 9 Score 0       Cognitive Function    Knows all medications and doctors     Immunization History  Administered Date(s) Administered   Fluad Quad(high Dose 65+) 09/16/2019, 09/05/2021, 07/24/2022   Influenza Split 11/05/2012   Influenza,inj,Quad PF,6+ Mos 07/29/2013, 08/11/2014, 08/24/2015, 08/29/2016, 09/04/2017, 09/09/2018, 08/30/2020   Influenza-Unspecified 07/24/2022   PFIZER(Purple Top)SARS-COV-2 Vaccination 12/10/2019, 12/31/2019, 07/26/2020   Pneumococcal Conjugate-13 08/24/2015   Pneumococcal Polysaccharide-23 02/21/2011   Td 12/04/2003   Tdap 03/15/2014   Unspecified SARS-COV-2 Vaccination 02/24/2021, 08/24/2021   Zoster Recombinat (Shingrix) 11/11/2018, 04/29/2019   Zoster, Live 01/15/2008    Screening Tests Health Maintenance  Topic Date Due   DTAP VACCINES (1) 08/27/1946   COVID-19 Vaccine (6 - 2023-24 season) 06/29/2022   Medicare Annual Wellness (AWV)  09/05/2022   DTaP/Tdap/Td (3 - Td or Tdap) 03/15/2024   Pneumonia Vaccine 54+ Years old  Completed   INFLUENZA VACCINE  Completed   Hepatitis C Screening  Completed   Zoster Vaccines- Shingrix  Completed   HPV VACCINES  Aged Out   COLONOSCOPY (Pts 45-68yr Insurance coverage will need to be confirmed)  Discontinued       Plan:   ***  I have personally reviewed and noted the following in the patient's chart:   Medical and social history Use of alcohol, tobacco or illicit drugs  Current medications and supplements Functional ability and status Nutritional status Physical activity Advanced directives List of other physicians Hospitalizations, surgeries, and ER visits in previous 12 months Vitals Screenings to include cognitive, depression, and falls Referrals and appointments  In addition, I have reviewed and  discussed with patient certain preventive protocols, quality metrics, and best practice recommendations. A written personalized care plan for preventive services as well as general preventive health recommendations were provided to patient.  Screening for cholesterol level -     Lipid panel     Patient Instructions  Good to see you today - Thank you for coming in  Things we discussed today:  I will call you if your tests are not good.  Otherwise, I will send you a message on MyChart (if it is active) or a letter in the mail..  If you do not hear from me with in 2 weeks please call our office.     Contact me with any questions are problems    MLind Covert MD CDelta MD  09/18/2022

## 2022-09-18 NOTE — Patient Instructions (Signed)
Good to see you today - Thank you for coming in  Things we discussed today:  I will call you if your tests are not good.  Otherwise, I will send you a message on MyChart (if it is active) or a letter in the mail..  If you do not hear from me with in 2 weeks please call our office.     Contact me with any questions are problems

## 2022-09-19 LAB — LIPID PANEL
Chol/HDL Ratio: 2.3 ratio (ref 0.0–5.0)
Cholesterol, Total: 144 mg/dL (ref 100–199)
HDL: 62 mg/dL (ref >39)
LDL Chol Calc (NIH): 60 mg/dL (ref 0–99)
Triglycerides: 125 mg/dL (ref 0–149)
VLDL Cholesterol Cal: 22 mg/dL (ref 5–40)

## 2022-09-20 ENCOUNTER — Encounter: Payer: Self-pay | Admitting: Family Medicine

## 2022-09-20 ENCOUNTER — Other Ambulatory Visit: Payer: Self-pay | Admitting: Oncology

## 2022-09-24 ENCOUNTER — Encounter: Payer: Self-pay | Admitting: Oncology

## 2022-09-24 ENCOUNTER — Other Ambulatory Visit: Payer: Self-pay | Admitting: *Deleted

## 2022-09-24 MED ORDER — FLUCONAZOLE 100 MG PO TABS: 100.0000 mg | ORAL_TABLET | Freq: Every day | ORAL | 0 refills | Status: DC

## 2022-09-24 NOTE — Telephone Encounter (Addendum)
Patient left VM requesting refill on Fluconazole 100 mg qd x 7 days that Dr. Alen Blew had called in on 09/01/22 due to thrush. Reports he has white patches in his mouth again. OK to refill, per Dr. [name].

## 2022-09-26 ENCOUNTER — Inpatient Hospital Stay (HOSPITAL_BASED_OUTPATIENT_CLINIC_OR_DEPARTMENT_OTHER): Payer: Medicare HMO | Admitting: Oncology

## 2022-09-26 ENCOUNTER — Inpatient Hospital Stay: Payer: Medicare HMO

## 2022-09-26 ENCOUNTER — Encounter: Payer: Self-pay | Admitting: *Deleted

## 2022-09-26 ENCOUNTER — Other Ambulatory Visit: Payer: Self-pay | Admitting: *Deleted

## 2022-09-26 ENCOUNTER — Other Ambulatory Visit: Payer: Self-pay | Admitting: Oncology

## 2022-09-26 ENCOUNTER — Inpatient Hospital Stay: Payer: Medicare HMO | Admitting: Nutrition

## 2022-09-26 VITALS — BP 98/60 | HR 60 | Resp 20

## 2022-09-26 VITALS — BP 111/68 | HR 66 | Temp 97.8°F | Resp 18 | Ht 72.0 in | Wt 229.6 lb

## 2022-09-26 DIAGNOSIS — C251 Malignant neoplasm of body of pancreas: Secondary | ICD-10-CM

## 2022-09-26 DIAGNOSIS — Z5111 Encounter for antineoplastic chemotherapy: Secondary | ICD-10-CM | POA: Diagnosis not present

## 2022-09-26 LAB — CMP (CANCER CENTER ONLY)
ALT: 34 U/L (ref 0–44)
AST: 23 U/L (ref 15–41)
Albumin: 3.8 g/dL (ref 3.5–5.0)
Alkaline Phosphatase: 94 U/L (ref 38–126)
Anion gap: 10 (ref 5–15)
BUN: 13 mg/dL (ref 8–23)
CO2: 27 mmol/L (ref 22–32)
Calcium: 9.2 mg/dL (ref 8.9–10.3)
Chloride: 105 mmol/L (ref 98–111)
Creatinine: 0.78 mg/dL (ref 0.61–1.24)
GFR, Estimated: >60 mL/min (ref >60)
Glucose, Bld: 145 mg/dL — ABNORMAL HIGH (ref 70–99)
Potassium: 3.2 mmol/L — ABNORMAL LOW (ref 3.5–5.1)
Sodium: 142 mmol/L (ref 135–145)
Total Bilirubin: 0.5 mg/dL (ref 0.3–1.2)
Total Protein: 5.9 g/dL — ABNORMAL LOW (ref 6.5–8.1)

## 2022-09-26 LAB — CBC WITH DIFFERENTIAL (CANCER CENTER ONLY)
Abs Immature Granulocytes: 0.2 10*3/uL — ABNORMAL HIGH (ref 0.00–0.07)
Basophils Absolute: 0.1 10*3/uL (ref 0.0–0.1)
Basophils Relative: 1 %
Eosinophils Absolute: 0.2 10*3/uL (ref 0.0–0.5)
Eosinophils Relative: 2 %
HCT: 35.9 % — ABNORMAL LOW (ref 39.0–52.0)
Hemoglobin: 12 g/dL — ABNORMAL LOW (ref 13.0–17.0)
Lymphocytes Relative: 18 %
Lymphs Abs: 1.9 10*3/uL (ref 0.7–4.0)
MCH: 29.3 pg (ref 26.0–34.0)
MCHC: 33.4 g/dL (ref 30.0–36.0)
MCV: 87.8 fL (ref 80.0–100.0)
Monocytes Absolute: 0.6 10*3/uL (ref 0.1–1.0)
Monocytes Relative: 6 %
Myelocytes: 2 %
Neutro Abs: 7.6 10*3/uL (ref 1.7–7.7)
Neutrophils Relative %: 71 %
Platelet Count: 156 10*3/uL (ref 150–400)
RBC: 4.09 MIL/uL — ABNORMAL LOW (ref 4.22–5.81)
RDW: 18.6 % — ABNORMAL HIGH (ref 11.5–15.5)
Smear Review: NORMAL
WBC Count: 10.7 10*3/uL — ABNORMAL HIGH (ref 4.0–10.5)
nRBC: 1 % — ABNORMAL HIGH (ref 0.0–0.2)
nRBC: 5 /100 WBC — ABNORMAL HIGH

## 2022-09-26 LAB — MAGNESIUM: Magnesium: 1.7 mg/dL (ref 1.7–2.4)

## 2022-09-26 MED ORDER — DEXTROSE 5 % IV SOLN: Freq: Once | INTRAVENOUS | Status: AC

## 2022-09-26 MED ORDER — OXALIPLATIN CHEMO INJECTION 100 MG/20ML
65.0000 mg/m2 | Freq: Once | INTRAVENOUS | Status: AC
  Administered 2022-09-26: 150 mg via INTRAVENOUS
  Filled 2022-09-26: qty 10

## 2022-09-26 MED ORDER — PALONOSETRON HCL INJECTION 0.25 MG/5ML
0.2500 mg | Freq: Once | INTRAVENOUS | Status: AC
  Administered 2022-09-26: 0.25 mg via INTRAVENOUS
  Filled 2022-09-26: qty 5

## 2022-09-26 MED ORDER — SODIUM CHLORIDE 0.9 % IV SOLN
100.0000 mg/m2 | Freq: Once | INTRAVENOUS | Status: AC
  Administered 2022-09-26: 220 mg via INTRAVENOUS
  Filled 2022-09-26: qty 10

## 2022-09-26 MED ORDER — POTASSIUM CHLORIDE CRYS ER 20 MEQ PO TBCR: 20.0000 meq | EXTENDED_RELEASE_TABLET | Freq: Every day | ORAL | 1 refills | Status: DC

## 2022-09-26 MED ORDER — HYDROMORPHONE HCL 4 MG PO TABS: 4.0000 mg | ORAL_TABLET | Freq: Four times a day (QID) | ORAL | 0 refills | Status: DC | PRN

## 2022-09-26 MED ORDER — ATROPINE SULFATE 1 MG/ML IV SOLN
0.5000 mg | Freq: Once | INTRAVENOUS | Status: AC | PRN
  Administered 2022-09-26: 0.5 mg via INTRAVENOUS
  Filled 2022-09-26: qty 1

## 2022-09-26 MED ORDER — SODIUM CHLORIDE 0.9 % IV SOLN
2000.0000 mg/m2 | INTRAVENOUS | Status: DC
  Administered 2022-09-26: 4550 mg via INTRAVENOUS
  Filled 2022-09-26: qty 91

## 2022-09-26 MED ORDER — SODIUM CHLORIDE 0.9 % IV SOLN
150.0000 mg | Freq: Once | INTRAVENOUS | Status: AC
  Administered 2022-09-26: 150 mg via INTRAVENOUS
  Filled 2022-09-26: qty 5

## 2022-09-26 MED ORDER — SODIUM CHLORIDE 0.9 % IV SOLN
10.0000 mg | Freq: Once | INTRAVENOUS | Status: AC
  Administered 2022-09-26: 10 mg via INTRAVENOUS
  Filled 2022-09-26: qty 1

## 2022-09-26 MED ORDER — SODIUM CHLORIDE 0.9 % IV SOLN
400.0000 mg/m2 | Freq: Once | INTRAVENOUS | Status: AC
  Administered 2022-09-26: 908 mg via INTRAVENOUS
  Filled 2022-09-26: qty 45.4

## 2022-09-26 NOTE — Progress Notes (Signed)
Nutrition follow up completed with patient and wife in infusion for Pancreas cancer.  Weight documented as 229 pounds 10 oz, down from 235 pounds Sept 27.  Labs noted: potassium 3.2 and Glucose 145.  Medications reviewed.  Patient reports that everything is improved. He states pain has been better since having bile duct stones removed. Appetite is improved, especially the week before his treatment. Denies problems with bowels.  Nutrition Diagnosis: Food and Nutrition Related knowledge deficit improved.  Intervention: Encouraged medications as needed to control pain and manage stools. Continue small frequent meals and snacks to minimize weight loss. Encouraged patient call RD for questions or concerns.  Monitoring, Evaluation, Goals: Patient will tolerate adequate calories and protein to minimize weight loss.  Next Visit:  To Be scheduled as needed.

## 2022-09-26 NOTE — Progress Notes (Signed)
[facility] OFFICE PROGRESS NOTE   Diagnosis: Pancreas cancer  INTERVAL HISTORY:   Mr. Savant completed another cycle of FOLFIRINOX on 09/12/2022.  He reports nausea and diarrhea for a few days following chemotherapy.  Antiemetics and Imodium helped.  Abdominal pain is much improved.  He is now taking hydromorphone 3 times per day.  He has noted increased swelling in the left lower leg for the past few days.  He had recurrent thrush and begin Diflucan 2 days ago.  No leg pain or erythema.  Objective:  Vital signs in last 24 hours:  Blood pressure 111/68, pulse 66, temperature 97.8 F (36.6 C), temperature source Oral, resp. rate 18, height 6' (1.829 m), weight 229 lb 9.6 oz (104.1 kg), SpO2 99 %.    HEENT: Mild white coat over the tongue, no buccal thrush or ulcers Resp: Lungs with end inspiratory bronchial sounds bilaterally, no respiratory distress Cardio: Regular rate and rhythm GI: Nontender, no mass, no hepatosplenomegaly Vascular: Trace pitting edema at the lower leg bilaterally.  No erythema or tenderness Neuro: Mild loss of vibratory sense at the fingertips bilaterally    Portacath/PICC-without erythema  Lab Results:  Lab Results  Component Value Date   WBC 10.7 (H) 09/26/2022   HGB 12.0 (L) 09/26/2022   HCT 35.9 (L) 09/26/2022   MCV 87.8 09/26/2022   PLT 156 09/26/2022   NEUTROABS PENDING 09/26/2022    CMP  Lab Results  Component Value Date   NA 144 09/12/2022   K 3.5 09/12/2022   CL 108 09/12/2022   CO2 26 09/12/2022   GLUCOSE 138 (H) 09/12/2022   BUN 14 09/12/2022   CREATININE 0.86 09/12/2022   CALCIUM 9.5 09/12/2022   PROT 6.3 (L) 09/12/2022   ALBUMIN 4.1 09/12/2022   AST 24 09/12/2022   ALT 55 (H) 09/12/2022   ALKPHOS 85 09/12/2022   BILITOT 0.5 09/12/2022   GFRNONAA >60 09/12/2022   GFRAA >90 08/10/2013    Lab Results  Component Value Date   SRP594 757 (H) 09/12/2022    Lab Results  Component Value Date   INR 1.0  12/22/2008   LABPROT 13.6 12/22/2008    Imaging:  No results found.  Medications: I have reviewed the patient's current medications.   Assessment/Plan: Pancreas cancer FNA biopsy of a pancreas body/tail mass 07/05/2022-adenocarcinoma CT abdomen/pelvis 06/14/2022-hypoenhancing pancreas body/tail mass with effacement of the splenic vein, no evidence of lymphadenopathy or metastatic disease CA 19-9 on 06/18/2022-767 EUS 07/05/2022-a 7 x 29 mm pancreas body/tail mass, abutment of the splenic artery, no malignant appearing lymph nodes, T2N0 by EUS CTs 07/15/2022-pancreas body/tail mass with no evidence of metastatic disease, splenic vein associated with the tumor, no arterial involvement, stable right greater than left lung nodules favored benign Cycle 1 FOLFOX 08/01/2022 Cycle 2 FOLFOX, 08/16/2022, neulasta Cycle 3 FOLFIRINOX 08/29/2022, Neulasta Cycle 4 FOLFIRINOX 09/12/2022, Neulasta, irinotecan and 5-fluorouracil dose reduced due to diarrhea/weight loss Cycle 5 FOLFIRINOX 09/26/2022 Prostate cancer, status post prostatectomy 1990 Chronic elevation of the PSA-maintained on Trelstar, followed by Dr. Jeffie Pollock   3.  Kidney stones 4.   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  Abdominal pain, likely secondary to #1 6.  Family history of prostate and pancreas cancer 7.  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  Typhlitis in 2014 9.  Colon polyps-tubular adenomas on colonoscopy 03/27/2022      Disposition: Mr. Rogan has completed 4 cycles FOLFIRINOX.  He tolerated the last cycle better with dose reductions of 5-FU and irinotecan.  He will complete cycle 5 today.  He would like to undergo restaging CTs after this cycle.  He has mild bilateral lower extremity edema.  I have a low clinical suspicion for a DVT.  He will call for leg pain, erythema, or increased swelling.  He will complete a course of Diflucan  for oral candidiasis.  Mr. Skelton will return for an office visit in 2 weeks.  I refilled his prescription for hydromorphone.  He will wean the hydromorphone as tolerated.  Betsy Coder, MD  09/26/2022  9:16 AM

## 2022-09-26 NOTE — Patient Instructions (Signed)
[facility]   Discharge Instructions: Thank you for choosing [facility] to provide your oncology and hematology care.   If you have a lab appointment with the [facility], please go directly to the [facility] and check in at the registration area.   Wear comfortable clothing and clothing appropriate for easy access to any Portacath or PICC line.   We strive to give you quality time with your provider. You may need to reschedule your appointment if you arrive late (15 or more minutes).  Arriving late affects you and other patients whose appointments are after yours.  Also, if you miss three or more appointments without notifying the office, you may be dismissed from the clinic at the provider's discretion.      For prescription refill requests, have your pharmacy contact our office and allow 72 hours for refills to be completed.    Today you received the following chemotherapy and/or immunotherapy agents Oxaliplatin (ELOXATIN), Irinotecan (CAMPTOSAR), Leucovorin & Flourouracil (ADRUCIL).      To help prevent nausea and vomiting after your treatment, we encourage you to take your nausea medication as directed.  BELOW ARE SYMPTOMS THAT SHOULD BE REPORTED IMMEDIATELY: *FEVER GREATER THAN 100.4 F (38 C) OR HIGHER *CHILLS OR SWEATING *NAUSEA AND VOMITING THAT IS NOT CONTROLLED WITH YOUR NAUSEA MEDICATION *UNUSUAL SHORTNESS OF BREATH *UNUSUAL BRUISING OR BLEEDING *URINARY PROBLEMS (pain or burning when urinating, or frequent urination) *BOWEL PROBLEMS (unusual diarrhea, constipation, pain near the anus) TENDERNESS IN MOUTH AND THROAT WITH OR WITHOUT PRESENCE OF ULCERS (sore throat, sores in mouth, or a toothache) UNUSUAL RASH, SWELLING OR PAIN  UNUSUAL VAGINAL DISCHARGE OR ITCHING   Items with * indicate a potential emergency and should be followed up as soon as possible or go to the Emergency Department if any problems should occur.  Please  show the CHEMOTHERAPY ALERT CARD or IMMUNOTHERAPY ALERT CARD at check-in to the Emergency Department and triage nurse.  Should you have questions after your visit or need to cancel or reschedule your appointment, please contact [facility]  Dept: [phone]  and follow the prompts.  Office hours are 8:00 a.m. to 4:30 p.m. Monday - Friday. Please note that voicemails left after 4:00 p.m. may not be returned until the following business day.  We are closed weekends and major holidays. You have access to a nurse at all times for urgent questions. Please call the main number to the clinic Dept: [phone] and follow the prompts.   For any non-urgent questions, you may also contact your provider using MyChart. We now offer e-Visits for anyone 7 and older to request care online for non-urgent symptoms. For details visit [email or web address].   Also download the MyChart app! Go to the app store, search "MyChart", open the app, select [facility], and log in with your MyChart username and password.  Masks are optional in the cancer centers. If you would like for your care team to wear a mask while they are taking care of you, please let them know. You may have one support person who is at least 77 years old accompany you for your appointments.  Oxaliplatin Injection What is this medication? OXALIPLATIN (ox AL i PLA tin) treats some types of cancer. It works by slowing down the growth of cancer cells. This medicine may be used for other purposes; ask your health care provider or pharmacist if you have questions. COMMON BRAND NAME(S): Eloxatin What should I  tell my care team before I take this medication? They need to know if you have any of these conditions: Heart disease History of irregular heartbeat or rhythm Liver disease Low blood cell levels (white cells, red cells, and platelets) Lung or breathing disease, such as asthma Take medications that treat or prevent  blood clots Tingling of the fingers, toes, or other nerve disorder An unusual or allergic reaction to oxaliplatin, other medications, foods, dyes, or preservatives If you or your partner are pregnant or trying to get pregnant Breast-feeding How should I use this medication? This medication is injected into a vein. It is given by your care team in a hospital or clinic setting. Talk to your care team about the use of this medication in children. Special care may be needed. Overdosage: If you think you have taken too much of this medicine contact a poison control center or emergency room at once. NOTE: This medicine is only for you. Do not share this medicine with others. What if I miss a dose? Keep appointments for follow-up doses. It is important not to miss a dose. Call your care team if you are unable to keep an appointment. What may interact with this medication? Do not take this medication with any of the following: Cisapride Dronedarone Pimozide Thioridazine This medication may also interact with the following: Aspirin and aspirin-like medications Certain medications that treat or prevent blood clots, such as warfarin, apixaban, dabigatran, and rivaroxaban Cisplatin Cyclosporine Diuretics Medications for infection, such as acyclovir, adefovir, amphotericin B, bacitracin, cidofovir, foscarnet, ganciclovir, gentamicin, pentamidine, vancomycin NSAIDs, medications for pain and inflammation, such as ibuprofen or naproxen Other medications that cause heart rhythm changes Pamidronate Zoledronic acid This list may not describe all possible interactions. Give your health care provider a list of all the medicines, herbs, non-prescription drugs, or dietary supplements you use. Also tell them if you smoke, drink alcohol, or use illegal drugs. Some items may interact with your medicine. What should I watch for while using this medication? Your condition will be monitored carefully while you are  receiving this medication. You may need blood work while taking this medication. This medication may make you feel generally unwell. This is not uncommon as chemotherapy can affect healthy cells as well as cancer cells. Report any side effects. Continue your course of treatment even though you feel ill unless your care team tells you to stop. This medication may increase your risk of getting an infection. Call your care team for advice if you get a fever, chills, sore throat, or other symptoms of a cold or flu. Do not treat yourself. Try to avoid being around people who are sick. Avoid taking medications that contain aspirin, acetaminophen, ibuprofen, naproxen, or ketoprofen unless instructed by your care team. These medications may hide a fever. Be careful brushing or flossing your teeth or using a toothpick because you may get an infection or bleed more easily. If you have any dental work done, tell your dentist you are receiving this medication. This medication can make you more sensitive to cold. Do not drink cold drinks or use ice. Cover exposed skin before coming in contact with cold temperatures or cold objects. When out in cold weather wear warm clothing and cover your mouth and nose to warm the air that goes into your lungs. Tell your care team if you get sensitive to the cold. Talk to your care team if you or your partner are pregnant or think either of you might be pregnant. This  medication can cause serious birth defects if taken during pregnancy and for 9 months after the last dose. A negative pregnancy test is required before starting this medication. A reliable form of contraception is recommended while taking this medication and for 9 months after the last dose. Talk to your care team about effective forms of contraception. Do not father a child while taking this medication and for 6 months after the last dose. Use a condom while having sex during this time period. Do not breastfeed while  taking this medication and for 3 months after the last dose. This medication may cause infertility. Talk to your care team if you are concerned about your fertility. What side effects may I notice from receiving this medication? Side effects that you should report to your care team as soon as possible: Allergic reactions--skin rash, itching, hives, swelling of the face, lips, tongue, or throat Bleeding--bloody or black, tar-like stools, vomiting blood or brown material that looks like coffee grounds, red or dark brown urine, small red or purple spots on skin, unusual bruising or bleeding Dry cough, shortness of breath or trouble breathing Heart rhythm changes--fast or irregular heartbeat, dizziness, feeling faint or lightheaded, chest pain, trouble breathing Infection--fever, chills, cough, sore throat, wounds that don't heal, pain or trouble when passing urine, general feeling of discomfort or being unwell Liver injury--right upper belly pain, loss of appetite, nausea, light-colored stool, dark yellow or brown urine, yellowing skin or eyes, unusual weakness or fatigue Low red blood cell level--unusual weakness or fatigue, dizziness, headache, trouble breathing Muscle injury--unusual weakness or fatigue, muscle pain, dark yellow or brown urine, decrease in amount of urine Pain, tingling, or numbness in the hands or feet Sudden and severe headache, confusion, change in vision, seizures, which may be signs of posterior reversible encephalopathy syndrome (PRES) Unusual bruising or bleeding Side effects that usually do not require medical attention (report to your care team if they continue or are bothersome): Diarrhea Nausea Pain, redness, or swelling with sores inside the mouth or throat Unusual weakness or fatigue Vomiting This list may not describe all possible side effects. Call your doctor for medical advice about side effects. You may report side effects to FDA at 1-800-FDA-1088. Where  should I keep my medication? This medication is given in a hospital or clinic. It will not be stored at home. NOTE: This sheet is a summary. It may not cover all possible information. If you have questions about this medicine, talk to your doctor, pharmacist, or health care provider.  2023 Elsevier/Gold Standard (2007-12-06 00:00:00)  Irinotecan Injection What is this medication? IRINOTECAN (ir in oh TEE kan) treats some types of cancer. It works by slowing down the growth of cancer cells. This medicine may be used for other purposes; ask your health care provider or pharmacist if you have questions. COMMON BRAND NAME(S): Camptosar What should I tell my care team before I take this medication? They need to know if you have any of these conditions: Dehydration Diarrhea Infection, especially a viral infection, such as chickenpox, cold sores, herpes Liver disease Low blood cell levels (white cells, red cells, and platelets) Low levels of electrolytes, such as calcium, magnesium, or potassium in your blood Recent or ongoing radiation An unusual or allergic reaction to irinotecan, other medications, foods, dyes, or preservatives If you or your partner are pregnant or trying to get pregnant Breast-feeding How should I use this medication? This medication is injected into a vein. It is given by your care team  in a hospital or clinic setting. Talk to your care team about the use of this medication in children. Special care may be needed. Overdosage: If you think you have taken too much of this medicine contact a poison control center or emergency room at once. NOTE: This medicine is only for you. Do not share this medicine with others. What if I miss a dose? Keep appointments for follow-up doses. It is important not to miss your dose. Call your care team if you are unable to keep an appointment. What may interact with this medication? Do not take this medication with any of the  following: Cobicistat Itraconazole This medication may also interact with the following: Certain antibiotics, such as clarithromycin, rifampin, rifabutin Certain antivirals for HIV or AIDS Certain medications for fungal infections, such as ketoconazole, posaconazole, voriconazole Certain medications for seizures, such as carbamazepine, phenobarbital, phenytoin Gemfibrozil Nefazodone St. John's wort This list may not describe all possible interactions. Give your health care provider a list of all the medicines, herbs, non-prescription drugs, or dietary supplements you use. Also tell them if you smoke, drink alcohol, or use illegal drugs. Some items may interact with your medicine. What should I watch for while using this medication? Your condition will be monitored carefully while you are receiving this medication. You may need blood work while taking this medication. This medication may make you feel generally unwell. This is not uncommon as chemotherapy can affect healthy cells as well as cancer cells. Report any side effects. Continue your course of treatment even though you feel ill unless your care team tells you to stop. This medication can cause serious side effects. To reduce the risk, your care team may give you other medications to take before receiving this one. Be sure to follow the directions from your care team. This medication may affect your coordination, reaction time, or judgement. Do not drive or operate machinery until you know how this medication affects you. Sit up or stand slowly to reduce the risk of dizzy or fainting spells. Drinking alcohol with this medication can increase the risk of these side effects. This medication may increase your risk of getting an infection. Call your care team for advice if you get a fever, chills, sore throat, or other symptoms of a cold or flu. Do not treat yourself. Try to avoid being around people who are sick. Avoid taking medications that  contain aspirin, acetaminophen, ibuprofen, naproxen, or ketoprofen unless instructed by your care team. These medications may hide a fever. This medication may increase your risk to bruise or bleed. Call your care team if you notice any unusual bleeding. Be careful brushing or flossing your teeth or using a toothpick because you may get an infection or bleed more easily. If you have any dental work done, tell your dentist you are receiving this medication. Talk to your care team if you or your partner are pregnant or think either of you might be pregnant. This medication can cause serious birth defects if taken during pregnancy and for 6 months after the last dose. You will need a negative pregnancy test before starting this medication. Contraception is recommended while taking this medication and for 6 months after the last dose. Your care team can help you find the option that works for you. Do not father a child while taking this medication and for 3 months after the last dose. Use a condom for contraception during this time period. Do not breastfeed while taking this medication and for  7 days after the last dose. This medication may cause infertility. Talk to your care team if you are concerned about your fertility. What side effects may I notice from receiving this medication? Side effects that you should report to your care team as soon as possible: Allergic reactions--skin rash, itching, hives, swelling of the face, lips, tongue, or throat Dry cough, shortness of breath or trouble breathing Increased saliva or tears, increased sweating, stomach cramping, diarrhea, small pupils, unusual weakness or fatigue, slow heartbeat Infection--fever, chills, cough, sore throat, wounds that don't heal, pain or trouble when passing urine, general feeling of discomfort or being unwell Kidney injury--decrease in the amount of urine, swelling of the ankles, hands, or feet Low red blood cell level--unusual  weakness or fatigue, dizziness, headache, trouble breathing Severe or prolonged diarrhea Unusual bruising or bleeding Side effects that usually do not require medical attention (report to your care team if they continue or are bothersome): Constipation Diarrhea Hair loss Loss of appetite Nausea Stomach pain This list may not describe all possible side effects. Call your doctor for medical advice about side effects. You may report side effects to FDA at 1-800-FDA-1088. Where should I keep my medication? This medication is given in a hospital or clinic. It will not be stored at home. NOTE: This sheet is a summary. It may not cover all possible information. If you have questions about this medicine, talk to your doctor, pharmacist, or health care provider.  2023 Elsevier/Gold Standard (2022-02-22 00:00:00)  Leucovorin Injection What is this medication? LEUCOVORIN (loo koe VOR in) prevents side effects from certain medications, such as methotrexate. It works by increasing folate levels. This helps protect healthy cells in your body. It may also be used to treat anemia caused by low levels of folate. It can also be used with fluorouracil, a type of chemotherapy, to treat colorectal cancer. It works by increasing the effects of fluorouracil in the body. This medicine may be used for other purposes; ask your health care provider or pharmacist if you have questions. What should I tell my care team before I take this medication? They need to know if you have any of these conditions: Anemia from low levels of vitamin B12 in the blood An unusual or allergic reaction to leucovorin, folic acid, other medications, foods, dyes, or preservatives Pregnant or trying to get pregnant Breastfeeding How should I use this medication? This medication is injected into a vein or a muscle. It is given by your care team in a hospital or clinic setting. Talk to your care team about the use of this medication in  children. Special care may be needed. Overdosage: If you think you have taken too much of this medicine contact a poison control center or emergency room at once. NOTE: This medicine is only for you. Do not share this medicine with others. What if I miss a dose? Keep appointments for follow-up doses. It is important not to miss your dose. Call your care team if you are unable to keep an appointment. What may interact with this medication? Capecitabine Fluorouracil Phenobarbital Phenytoin Primidone Trimethoprim;sulfamethoxazole This list may not describe all possible interactions. Give your health care provider a list of all the medicines, herbs, non-prescription drugs, or dietary supplements you use. Also tell them if you smoke, drink alcohol, or use illegal drugs. Some items may interact with your medicine. What should I watch for while using this medication? Your condition will be monitored carefully while you are receiving this medication. This  medication may increase the side effects of 5-fluorouracil. Tell your care team if you have diarrhea or mouth sores that do not get better or that get worse. What side effects may I notice from receiving this medication? Side effects that you should report to your care team as soon as possible: Allergic reactions--skin rash, itching, hives, swelling of the face, lips, tongue, or throat This list may not describe all possible side effects. Call your doctor for medical advice about side effects. You may report side effects to FDA at 1-800-FDA-1088. Where should I keep my medication? This medication is given in a hospital or clinic. It will not be stored at home. NOTE: This sheet is a summary. It may not cover all possible information. If you have questions about this medicine, talk to your doctor, pharmacist, or health care provider.  2023 Elsevier/Gold Standard (2022-03-20 00:00:00)  Fluorouracil Injection What is this medication? FLUOROURACIL  (flure oh YOOR a sil) treats some types of cancer. It works by slowing down the growth of cancer cells. This medicine may be used for other purposes; ask your health care provider or pharmacist if you have questions. COMMON BRAND NAME(S): Adrucil What should I tell my care team before I take this medication? They need to know if you have any of these conditions: Blood disorders Dihydropyrimidine dehydrogenase (DPD) deficiency Infection, such as chickenpox, cold sores, herpes Kidney disease Liver disease Poor nutrition Recent or ongoing radiation therapy An unusual or allergic reaction to fluorouracil, other medications, foods, dyes, or preservatives If you or your partner are pregnant or trying to get pregnant Breast-feeding How should I use this medication? This medication is injected into a vein. It is administered by your care team in a hospital or clinic setting. Talk to your care team about the use of this medication in children. Special care may be needed. Overdosage: If you think you have taken too much of this medicine contact a poison control center or emergency room at once. NOTE: This medicine is only for you. Do not share this medicine with others. What if I miss a dose? Keep appointments for follow-up doses. It is important not to miss your dose. Call your care team if you are unable to keep an appointment. What may interact with this medication? Do not take this medication with any of the following: Live virus vaccines This medication may also interact with the following: Medications that treat or prevent blood clots, such as warfarin, enoxaparin, dalteparin This list may not describe all possible interactions. Give your health care provider a list of all the medicines, herbs, non-prescription drugs, or dietary supplements you use. Also tell them if you smoke, drink alcohol, or use illegal drugs. Some items may interact with your medicine. What should I watch for while using  this medication? Your condition will be monitored carefully while you are receiving this medication. This medication may make you feel generally unwell. This is not uncommon as chemotherapy can affect healthy cells as well as cancer cells. Report any side effects. Continue your course of treatment even though you feel ill unless your care team tells you to stop. In some cases, you may be given additional medications to help with side effects. Follow all directions for their use. This medication may increase your risk of getting an infection. Call your care team for advice if you get a fever, chills, sore throat, or other symptoms of a cold or flu. Do not treat yourself. Try to avoid being around people who  are sick. This medication may increase your risk to bruise or bleed. Call your care team if you notice any unusual bleeding. Be careful brushing or flossing your teeth or using a toothpick because you may get an infection or bleed more easily. If you have any dental work done, tell your dentist you are receiving this medication. Avoid taking medications that contain aspirin, acetaminophen, ibuprofen, naproxen, or ketoprofen unless instructed by your care team. These medications may hide a fever. Do not treat diarrhea with over the counter products. Contact your care team if you have diarrhea that lasts more than 2 days or if it is severe and watery. This medication can make you more sensitive to the sun. Keep out of the sun. If you cannot avoid being in the sun, wear protective clothing and sunscreen. Do not use sun lamps, tanning beds, or tanning booths. Talk to your care team if you or your partner wish to become pregnant or think you might be pregnant. This medication can cause serious birth defects if taken during pregnancy and for 3 months after the last dose. A reliable form of contraception is recommended while taking this medication and for 3 months after the last dose. Talk to your care team  about effective forms of contraception. Do not father a child while taking this medication and for 3 months after the last dose. Use a condom while having sex during this time period. Do not breastfeed while taking this medication. This medication may cause infertility. Talk to your care team if you are concerned about your fertility. What side effects may I notice from receiving this medication? Side effects that you should report to your care team as soon as possible: Allergic reactions--skin rash, itching, hives, swelling of the face, lips, tongue, or throat Heart attack--pain or tightness in the chest, shoulders, arms, or jaw, nausea, shortness of breath, cold or clammy skin, feeling faint or lightheaded Heart failure--shortness of breath, swelling of the ankles, feet, or hands, sudden weight gain, unusual weakness or fatigue Heart rhythm changes--fast or irregular heartbeat, dizziness, feeling faint or lightheaded, chest pain, trouble breathing High ammonia level--unusual weakness or fatigue, confusion, loss of appetite, nausea, vomiting, seizures Infection--fever, chills, cough, sore throat, wounds that don't heal, pain or trouble when passing urine, general feeling of discomfort or being unwell Low red blood cell level--unusual weakness or fatigue, dizziness, headache, trouble breathing Pain, tingling, or numbness in the hands or feet, muscle weakness, change in vision, confusion or trouble speaking, loss of balance or coordination, trouble walking, seizures Redness, swelling, and blistering of the skin over hands and feet Severe or prolonged diarrhea Unusual bruising or bleeding Side effects that usually do not require medical attention (report to your care team if they continue or are bothersome): Dry skin Headache Increased tears Nausea Pain, redness, or swelling with sores inside the mouth or throat Sensitivity to light Vomiting This list may not describe all possible side effects.  Call your doctor for medical advice about side effects. You may report side effects to FDA at 1-800-FDA-1088. Where should I keep my medication? This medication is given in a hospital or clinic. It will not be stored at home. NOTE: This sheet is a summary. It may not cover all possible information. If you have questions about this medicine, talk to your doctor, pharmacist, or health care provider.  2023 Elsevier/Gold Standard (2022-02-13 00:00:00)  The chemotherapy medication bag should finish at 46 hours, 96 hours, or 7 days. For example, if your pump  is scheduled for 46 hours and it was put on at 4:00 p.m., it should finish at 2:00 p.m. the day it is scheduled to come off regardless of your appointment time.     Estimated time to finish at 1:45 p.m. on Friday 09/28/2022.   If the display on your pump reads "Low Volume" and it is beeping, take the batteries out of the pump and come to the cancer center for it to be taken off.   If the pump alarms go off prior to the pump reading "Low Volume" then call [phone] and someone can assist you.  If the plunger comes out and the chemotherapy medication is leaking out, please use your home chemo spill kit to clean up the spill. Do NOT use paper towels or other household products.  If you have problems or questions regarding your pump, please call either [phone] (24 hours a day) or the cancer center Monday-Friday 8:00 a.m.- 4:30 p.m. at the clinic number and we will assist you. If you are unable to get assistance, then go to the nearest Emergency Department and ask the staff to contact the IV team for assistance.

## 2022-09-26 NOTE — Progress Notes (Signed)
Patient seen by Dr. [name] today  Vitals are within treatment parameters.  Labs reviewed by Dr. [name] and are within treatment parameters. 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

## 2022-09-28 ENCOUNTER — Inpatient Hospital Stay: Payer: Medicare HMO | Attending: Oncology

## 2022-09-28 VITALS — BP 105/74 | HR 86 | Temp 97.8°F | Resp 20

## 2022-09-28 DIAGNOSIS — Z5189 Encounter for other specified aftercare: Secondary | ICD-10-CM | POA: Diagnosis not present

## 2022-09-28 DIAGNOSIS — C258 Malignant neoplasm of overlapping sites of pancreas: Secondary | ICD-10-CM | POA: Diagnosis present

## 2022-09-28 DIAGNOSIS — Z8546 Personal history of malignant neoplasm of prostate: Secondary | ICD-10-CM | POA: Insufficient documentation

## 2022-09-28 DIAGNOSIS — Z452 Encounter for adjustment and management of vascular access device: Secondary | ICD-10-CM | POA: Diagnosis not present

## 2022-09-28 DIAGNOSIS — C251 Malignant neoplasm of body of pancreas: Secondary | ICD-10-CM

## 2022-09-28 LAB — CANCER ANTIGEN 19-9: CA 19-9: 583 U/mL — ABNORMAL HIGH (ref 0–35)

## 2022-09-28 MED ORDER — SODIUM CHLORIDE 0.9% FLUSH
10.0000 mL | INTRAVENOUS | Status: DC | PRN
  Administered 2022-09-28: 10 mL

## 2022-09-28 MED ORDER — SODIUM CHLORIDE 0.9 % IV SOLN: INTRAVENOUS | Status: AC

## 2022-09-28 MED ORDER — PEGFILGRASTIM INJECTION 6 MG/0.6ML ~~LOC~~
6.0000 mg | PREFILLED_SYRINGE | Freq: Once | SUBCUTANEOUS | Status: AC
  Administered 2022-09-28: 6 mg via SUBCUTANEOUS
  Filled 2022-09-28: qty 0.6

## 2022-09-28 MED ORDER — HEPARIN SOD (PORK) LOCK FLUSH 100 UNIT/ML IV SOLN
500.0000 [IU] | Freq: Once | INTRAVENOUS | Status: AC | PRN
  Administered 2022-09-28: 500 [IU]

## 2022-09-28 NOTE — Patient Instructions (Signed)
Pegfilgrastim Injection What is this medication? PEGFILGRASTIM (PEG fil gra stim) lowers the risk of infection in people who are receiving chemotherapy. It works by helping your body make more white blood cells, which protects your body from infection. It may also be used to help people who have been exposed to high doses of radiation. This medicine may be used for other purposes; ask your health care provider or pharmacist if you have questions. COMMON BRAND NAME(S): Fulphila, Fylnetra, Neulasta, Nyvepria, Stimufend, UDENYCA, Ziextenzo What should I tell my care team before I take this medication? They need to know if you have any of these conditions: Kidney disease Latex allergy Ongoing radiation therapy Sickle cell disease Skin reactions to acrylic adhesives (On-Body Injector only) An unusual or allergic reaction to pegfilgrastim, filgrastim, other medications, foods, dyes, or preservatives Pregnant or trying to get pregnant Breast-feeding How should I use this medication? This medication is for injection under the skin. If you get this medication at home, you will be taught how to prepare and give the pre-filled syringe or how to use the On-body Injector. Refer to the patient Instructions for Use for detailed instructions. Use exactly as directed. Tell your care team immediately if you suspect that the On-body Injector may not have performed as intended or if you suspect the use of the On-body Injector resulted in a missed or partial dose. It is important that you put your used needles and syringes in a special sharps container. Do not put them in a trash can. If you do not have a sharps container, call your pharmacist or care team to get one. Talk to your care team about the use of this medication in children. While this medication may be prescribed for selected conditions, precautions do apply. Overdosage: If you think you have taken too much of this medicine contact a poison control center  or emergency room at once. NOTE: This medicine is only for you. Do not share this medicine with others. What if I miss a dose? It is important not to miss your dose. Call your care team if you miss your dose. If you miss a dose due to an On-body Injector failure or leakage, a new dose should be administered as soon as possible using a single prefilled syringe for manual use. What may interact with this medication? Interactions have not been studied. This list may not describe all possible interactions. Give your health care provider a list of all the medicines, herbs, non-prescription drugs, or dietary supplements you use. Also tell them if you smoke, drink alcohol, or use illegal drugs. Some items may interact with your medicine. What should I watch for while using this medication? Your condition will be monitored carefully while you are receiving this medication. You may need blood work done while you are taking this medication. Talk to your care team about your risk of cancer. You may be more at risk for certain types of cancer if you take this medication. If you are going to need a MRI, CT scan, or other procedure, tell your care team that you are using this medication (On-Body Injector only). What side effects may I notice from receiving this medication? Side effects that you should report to your care team as soon as possible: Allergic reactions--skin rash, itching, hives, swelling of the face, lips, tongue, or throat Capillary leak syndrome--stomach or muscle pain, unusual weakness or fatigue, feeling faint or lightheaded, decrease in the amount of urine, swelling of the ankles, hands, or feet, trouble   breathing High white blood cell level--fever, fatigue, trouble breathing, night sweats, change in vision, weight loss Inflammation of the aorta--fever, fatigue, back, chest, or stomach pain, severe headache Kidney injury (glomerulonephritis)--decrease in the amount of urine, red or dark brown  urine, foamy or bubbly urine, swelling of the ankles, hands, or feet Shortness of breath or trouble breathing Spleen injury--pain in upper left stomach or shoulder Unusual bruising or bleeding Side effects that usually do not require medical attention (report to your care team if they continue or are bothersome): Bone pain Pain in the hands or feet This list may not describe all possible side effects. Call your doctor for medical advice about side effects. You may report side effects to FDA at 1-800-FDA-1088. Where should I keep my medication? Keep out of the reach of children. If you are using this medication at home, you will be instructed on how to store it. Throw away any unused medication after the expiration date on the label. NOTE: This sheet is a summary. It may not cover all possible information. If you have questions about this medicine, talk to your doctor, pharmacist, or health care provider.  2023 Elsevier/Gold Standard (2021-05-04 00:00:00)  Rehydration, Older Adult  Rehydration is the replacement of fluids, salts, and minerals in the body (electrolytes) that are lost during dehydration. Dehydration is when there is not enough water or other fluids in the body. This happens when you lose more fluids than you take in. People who are age 22 or older have a higher risk of dehydration than younger adults. This is because in older age, the body: Is less able to maintain the right amount of water. Does not respond to temperature changes as well. Does not get a sense of thirst as easily or quickly. Other causes include: Not drinking enough fluids. This can occur when you are ill, when you forget to drink, or when you are doing activities that require a lot of energy, especially in hot weather. Conditions that cause loss of water or other fluids. These include diarrhea, vomiting, sweating, or urinating a lot. Other illnesses, such as fever or infection. Certain medicines, such as  those that remove excess fluid from the body (diuretics). Symptoms of mild or moderate dehydration may include thirst, dry lips and mouth, and dizziness. Symptoms of severe dehydration may include increased heart rate, confusion, fainting, and not urinating. In severe cases, you may need to get fluids through an IV at the hospital. For mild or moderate cases, you can usually rehydrate at home by drinking certain fluids as told by your health care provider. What are the risks? Rehydration is usually safe. Taking in too much fluid (overhydration) can be a problem but is rare. Overhydration can cause an imbalance of electrolytes in the body, kidney failure, fluid in the lungs, or a decrease in salt (sodium) levels in the body. Supplies needed: You will need an oral rehydration solution (ORS) if your health care provider tells you to use one. This is a drink to treat dehydration. It can be found in pharmacies and retail stores. How to rehydrate Fluids Follow instructions from your health care provider about what to drink. The kind of fluid and the amount you should drink depend on your condition. In general, you should choose drinks that you prefer. If told by your health care provider, drink an ORS. Make an ORS by following instructions on the package. Start by drinking small amounts, about  cup (120 mL) every 5-10 minutes. Slowly increase how  much you drink until you have taken in the amount recommended by your health care provider. Drink enough clear fluids to keep your urine pale yellow. If you were told to drink an ORS, finish it first, then start slowly drinking other clear fluids. Drink fluids such as: Water. This includes sparkling and flavored water. Drinking only water can lead to having too little sodium in your body (hyponatremia). Follow the advice of your health care provider. Water from ice chips you suck on. Fruit juice with water added to it(diluted). Sports drinks. Hot or cold  herbal teas. Broth-based soups. Coffee. Milk or milk products. Food Follow instructions from your health care provider about what to eat while you rehydrate. Your health care provider may recommend that you slowly begin eating regular foods in small amounts. Eat foods that contain a healthy balance of electrolytes, such as bananas, oranges, potatoes, tomatoes, and spinach. Avoid foods that are greasy or contain a lot of sugar. In some cases, you may get nutrition through a feeding tube that is passed through your nose and into your stomach (nasogastric tube, or NG tube). This may be done if you have uncontrolled vomiting or diarrhea. Drinks to avoid  Certain drinks may make dehydration worse. While you rehydrate, avoid drinking alcohol. How to tell if you are recovering from dehydration You may be getting better if: You are urinating more often than before you started rehydrating. Your urine is pale yellow. Your energy level improves. You vomit less often. You have diarrhea less often. Your appetite improves or returns to normal. You feel less dizzy or light-headed. Your skin tone and color start to look more normal. Follow these instructions at home: Take over-the-counter and prescription medicines only as told by your health care provider. Do not take sodium tablets. Doing this can lead to having too much sodium in your body (hypernatremia). Contact a health care provider if: You continue to have symptoms of mild or moderate dehydration, such as: Thirst. Dry lips. Slightly dry mouth. Dizziness. Dark urine or less urine than usual. Muscle cramps. You continue to vomit or have diarrhea. Get help right away if: You have symptoms of dehydration that get worse. You have a fever. You have a severe headache. You have been vomiting and have problems, such as: Your vomiting gets worse. Your vomit includes blood or green matter (bile). You cannot eat or drink without vomiting. You  have problems with urination or bowel movements, such as: Diarrhea that gets worse. Blood in your stool (feces). This may cause stool to look black and tarry. Not urinating, or urinating only a small amount of very dark urine, within 6-8 hours. You have trouble breathing. You have symptoms that get worse with treatment. These symptoms may be an emergency. Get help right away. Call 911. Do not wait to see if the symptoms will go away. Do not drive yourself to the hospital. This information is not intended to replace advice given to you by your health care provider. Make sure you discuss any questions you have with your health care provider. Document Revised: 02/28/2022 Document Reviewed: 02/26/2022 Elsevier Patient Education  [place].

## 2022-10-01 ENCOUNTER — Encounter (HOSPITAL_COMMUNITY): Payer: Self-pay

## 2022-10-01 ENCOUNTER — Emergency Department (HOSPITAL_COMMUNITY): Payer: Medicare HMO

## 2022-10-01 ENCOUNTER — Emergency Department (HOSPITAL_COMMUNITY)
Admission: EM | Admit: 2022-10-01 | Discharge: 2022-10-01 | Disposition: A | Discharge status: Home / Self Care | Payer: Medicare HMO | Source: Home / Self Care | Attending: Emergency Medicine | Admitting: Emergency Medicine

## 2022-10-01 ENCOUNTER — Telehealth: Payer: Self-pay

## 2022-10-01 DIAGNOSIS — R1084 Generalized abdominal pain: Secondary | ICD-10-CM | POA: Insufficient documentation

## 2022-10-01 DIAGNOSIS — B3781 Candidal esophagitis: Secondary | ICD-10-CM | POA: Diagnosis not present

## 2022-10-01 DIAGNOSIS — R197 Diarrhea, unspecified: Secondary | ICD-10-CM | POA: Insufficient documentation

## 2022-10-01 DIAGNOSIS — R112 Nausea with vomiting, unspecified: Secondary | ICD-10-CM | POA: Insufficient documentation

## 2022-10-01 DIAGNOSIS — R1013 Epigastric pain: Secondary | ICD-10-CM | POA: Diagnosis not present

## 2022-10-01 LAB — CBC WITH DIFFERENTIAL/PLATELET
Abs Immature Granulocytes: 3.59 10*3/uL — ABNORMAL HIGH (ref 0.00–0.07)
Basophils Absolute: 0 10*3/uL (ref 0.0–0.1)
Basophils Relative: 0 %
Eosinophils Absolute: 0 10*3/uL (ref 0.0–0.5)
Eosinophils Relative: 0 %
HCT: 36.7 % — ABNORMAL LOW (ref 39.0–52.0)
Hemoglobin: 12.4 g/dL — ABNORMAL LOW (ref 13.0–17.0)
Immature Granulocytes: 8 %
Lymphocytes Relative: 1 %
Lymphs Abs: 0.7 10*3/uL (ref 0.7–4.0)
MCH: 29.6 pg (ref 26.0–34.0)
MCHC: 33.8 g/dL (ref 30.0–36.0)
MCV: 87.6 fL (ref 80.0–100.0)
Monocytes Absolute: 0.3 10*3/uL (ref 0.1–1.0)
Monocytes Relative: 1 %
Neutro Abs: 40.7 10*3/uL — ABNORMAL HIGH (ref 1.7–7.7)
Neutrophils Relative %: 90 %
Platelets: 164 10*3/uL (ref 150–400)
RBC: 4.19 MIL/uL — ABNORMAL LOW (ref 4.22–5.81)
RDW: 18.4 % — ABNORMAL HIGH (ref 11.5–15.5)
WBC: 45.2 10*3/uL — ABNORMAL HIGH (ref 4.0–10.5)
nRBC: 0 % (ref 0.0–0.2)

## 2022-10-01 LAB — COMPREHENSIVE METABOLIC PANEL
ALT: 32 U/L (ref 0–44)
AST: 28 U/L (ref 15–41)
Albumin: 3.2 g/dL — ABNORMAL LOW (ref 3.5–5.0)
Alkaline Phosphatase: 114 U/L (ref 38–126)
Anion gap: 9 (ref 5–15)
BUN: 7 mg/dL — ABNORMAL LOW (ref 8–23)
CO2: 22 mmol/L (ref 22–32)
Calcium: 8.5 mg/dL — ABNORMAL LOW (ref 8.9–10.3)
Chloride: 105 mmol/L (ref 98–111)
Creatinine, Ser: 0.57 mg/dL — ABNORMAL LOW (ref 0.61–1.24)
GFR, Estimated: >60 mL/min (ref >60)
Glucose, Bld: 135 mg/dL — ABNORMAL HIGH (ref 70–99)
Potassium: 3.6 mmol/L (ref 3.5–5.1)
Sodium: 136 mmol/L (ref 135–145)
Total Bilirubin: 0.9 mg/dL (ref 0.3–1.2)
Total Protein: 5.8 g/dL — ABNORMAL LOW (ref 6.5–8.1)

## 2022-10-01 LAB — LIPASE, BLOOD: Lipase: 29 U/L (ref 11–51)

## 2022-10-01 MED ORDER — SODIUM CHLORIDE 0.9 % IV BOLUS
1000.0000 mL | Freq: Once | INTRAVENOUS | Status: AC
  Administered 2022-10-01: 1000 mL via INTRAVENOUS

## 2022-10-01 MED ORDER — IOHEXOL 300 MG/ML  SOLN
100.0000 mL | Freq: Once | INTRAMUSCULAR | Status: AC | PRN
  Administered 2022-10-01: 100 mL via INTRAVENOUS

## 2022-10-01 MED ORDER — HYDROMORPHONE HCL 1 MG/ML IJ SOLN
1.0000 mg | Freq: Once | INTRAMUSCULAR | Status: AC
  Administered 2022-10-01: 1 mg via INTRAVENOUS
  Filled 2022-10-01: qty 1

## 2022-10-01 MED ORDER — ONDANSETRON HCL 4 MG/2ML IJ SOLN
4.0000 mg | Freq: Once | INTRAMUSCULAR | Status: AC
  Administered 2022-10-01: 4 mg via INTRAVENOUS
  Filled 2022-10-01: qty 2

## 2022-10-01 MED ORDER — DEXAMETHASONE SODIUM PHOSPHATE 10 MG/ML IJ SOLN
10.0000 mg | Freq: Once | INTRAMUSCULAR | Status: AC
  Administered 2022-10-01: 10 mg via INTRAVENOUS
  Filled 2022-10-01: qty 1

## 2022-10-01 MED ORDER — HEPARIN SOD (PORK) LOCK FLUSH 100 UNIT/ML IV SOLN
500.0000 [IU] | Freq: Once | INTRAVENOUS | Status: AC
  Administered 2022-10-01: 500 [IU]
  Filled 2022-10-01: qty 5

## 2022-10-01 NOTE — Discharge Instructions (Addendum)
Return for any problem.  Follow-up with Dr. [name] as instructed.

## 2022-10-01 NOTE — ED Provider Notes (Signed)
[facility] DEPT Provider Note   CSN: [id number] Arrival date & time: 10/01/22  1356     History  No chief complaint on file.   Jared [name] is a 76 y.o. male.  76 year old male with prior medical history as detailed below presents for evaluation.  Patient is diagnosed with pancreatic cancer.  He is followed by Dr. [name] for same.  He reports recent round of oral chemotherapy stopping on Friday.  He reports that overnight he developed nausea, vomiting, and diarrhea.  This is associated with diffuse abdominal cramping and discomfort.  Patient was unable to take his nausea and pain medications this morning secondary to his symptoms.   Denies fever.  He denies bloody emesis or bloody stool.   The history is provided by the patient and medical records.       Home Medications Prior to Admission medications   Medication Sig Start Date End Date Taking? Authorizing Provider  acetaminophen (TYLENOL) 325 MG tablet Take 325 mg by mouth daily as needed for headache. Patient not taking: Reported on 08/29/2022    [provider]  Ascorbic Acid (VITAMIN C) 500 MG tablet Take 500 mg by mouth daily.      [provider]  brimonidine (ALPHAGAN) 0.15 % ophthalmic solution Place 1 drop into the left eye in the morning and at bedtime.    [provider]  Calcium Carbonate Antacid (TUMS ULTRA 1000 PO) Take 2,000-3,000 mg by mouth See admin instructions. 3000 mg by mouth in the morning and 2000 mg by mouth at night    [provider]  Carboxymethylcell-Glycerin PF (REFRESH OPTIVE PF) 0.5-0.9 % SOLN Place 1 drop into both eyes as needed (dry eyes). 04/28/18   [provider]  cholecalciferol (VITAMIN D3) 25 MCG (1000 UNIT) tablet Take 1,000 Units by mouth daily.    [provider]  Dorzolamide HCl-Timolol Mal PF 2-0.5 % SOLN Place 1 drop into the left eye 2 (two) times daily. 12/01/21   [provider]   fluconazole (DIFLUCAN) 100 MG tablet Take 1 tablet (100 mg total) by mouth daily. 09/24/22   Ladell Pier, MD  Garlic 8466 MG CAPS Take 1,000 mg by mouth daily.    [provider]  GEMTESA 75 MG TABS Take 75 mg by mouth daily. 01/31/22   [provider]  HYDROmorphone (DILAUDID) 4 MG tablet Take 1 tablet (4 mg total) by mouth every 6 (six) hours as needed for severe pain. 09/26/22   Ladell Pier, MD  ibuprofen (ADVIL,MOTRIN) 800 MG tablet Take 1 tablet (800 mg total) by mouth every 8 (eight) hours as needed. Patient not taking: Reported on 08/29/2022 09/09/18   [name], MD  lidocaine-prilocaine (EMLA) cream Apply 1 Application topically as needed. 07/27/22   Ladell Pier, MD  olopatadine (PATANOL) 0.1 % ophthalmic solution Place 1 drop into both eyes as needed for allergies.    [provider]  ondansetron (ZOFRAN) 8 MG tablet Take 1 tablet (8 mg total) by mouth every 8 (eight) hours as needed for nausea or vomiting (Starting day 3 after chemo as needed for nausea(received Aloxi with chemo)). 09/10/22   Ladell Pier, MD  potassium chloride SA (KLOR-CON M) 20 MEQ tablet Take 1 tablet (20 mEq total) by mouth daily. 09/26/22   Ladell Pier, MD  prochlorperazine (COMPAZINE) 10 MG tablet Take 1 tablet (10 mg total) by mouth every 6 (six) hours as needed for nausea or vomiting. 07/25/22  Owens Shark, NP  sodium chloride (AYR) 0.65 % nasal spray Place 2-3 sprays into the nose as needed for congestion.    [provider]  Triptorelin Pamoate 11.25 MG SUSR Inject 11.25 mg into the muscle as needed (PSA).    [provider]      Allergies    Claritin-d 24 hour [loratadine-pseudoephedrine er], Milk-related compounds, and Lupron [leuprolide]    Review of Systems   Review of Systems  All other systems reviewed and are negative.   Physical Exam Updated Vital Signs BP 120/79 (BP Location: Right Arm)   Pulse (!) 54   Temp 98.2  F (36.8 C) (Oral)   Resp 14   SpO2 100%  Physical Exam Vitals and nursing note reviewed.  Constitutional:      General: He is not in acute distress.    Appearance: Normal appearance. He is well-developed.  HENT:     Head: Normocephalic and atraumatic.  Eyes:     Conjunctiva/sclera: Conjunctivae normal.     Pupils: Pupils are equal, round, and reactive to light.  Cardiovascular:     Rate and Rhythm: Normal rate and regular rhythm.     Heart sounds: Normal heart sounds.  Pulmonary:     Effort: Pulmonary effort is normal. No respiratory distress.     Breath sounds: Normal breath sounds.  Abdominal:     General: There is no distension.     Palpations: Abdomen is soft.     Tenderness: There is no abdominal tenderness.  Musculoskeletal:        General: No deformity. Normal range of motion.     Cervical back: Normal range of motion and neck supple.  Skin:    General: Skin is warm and dry.  Neurological:     General: No focal deficit present.     Mental Status: He is alert and oriented to person, place, and time. Mental status is at baseline.     ED Results / Procedures / Treatments   Labs (all labs ordered are listed, but only abnormal results are displayed) Labs Reviewed  CBC WITH DIFFERENTIAL/PLATELET - Abnormal; Notable for the following components:      Result Value   WBC 45.2 (*)    RBC 4.19 (*)    Hemoglobin 12.4 (*)    HCT 36.7 (*)    RDW 18.4 (*)    Neutro Abs 40.7 (*)    Abs Immature Granulocytes 3.59 (*)    All other components within normal limits  COMPREHENSIVE METABOLIC PANEL - Abnormal; Notable for the following components:   Glucose, Bld 135 (*)    BUN 7 (*)    Creatinine, Ser 0.57 (*)    Calcium 8.5 (*)    Total Protein 5.8 (*)    Albumin 3.2 (*)    All other components within normal limits  LIPASE, BLOOD    EKG None  Radiology CT ABDOMEN PELVIS W CONTRAST  Result Date: 10/01/2022 CLINICAL DATA:  Pancreatic cancer diagnosed 07/05/2022 status  post chemotherapy. Remote prostatectomy for prostate cancer. Abdominal pain, nausea and vomiting. * Tracking Code: BO * EXAM: CT ABDOMEN AND PELVIS WITH CONTRAST TECHNIQUE: Multidetector CT imaging of the abdomen and pelvis was performed using the standard protocol following bolus administration of intravenous contrast. RADIATION DOSE REDUCTION: This exam was performed according to the departmental dose-optimization program which includes automated exposure control, adjustment of the mA and/or kV according to patient size and/or use of iterative reconstruction technique. CONTRAST:  185m OMNIPAQUE IOHEXOL 300  MG/ML  SOLN COMPARISON:  06/14/2022 CT abdomen/pelvis. FINDINGS: Lower chest: Basilar right lower lobe 0.4 cm solid pulmonary nodule (series 5/image 26), unchanged. No acute abnormality at the lung bases. Hepatobiliary: Normal liver size. No liver mass. Cholecystectomy. No biliary ductal dilatation. Pancreas: Poorly marginated hypodense 3.5 x 2.6 cm distal pancreatic body mass (series 2/image 25), mildly decreased from 3.9 x 3.3 cm. Persistent pancreatic duct dilation (6 mm diameter) and parenchymal atrophy in the pancreatic tail. Spleen: Normal size. No mass. Adrenals/Urinary Tract: Heterogeneous 2.8 x 2.0 cm left adrenal nodule with density 33 HU, increased from 2.3 x 1.6 cm and newly heterogeneous in density. Normal right adrenal. A few scattered nonobstructing right renal stones, largest 6 mm in the lower right kidney. Nonobstructing 3 mm lower left renal stone. No hydronephrosis. Simple 1.3 cm lower left renal cyst, for which no follow-up imaging is recommended. Normal bladder. Stomach/Bowel: Normal non-distended stomach. Normal caliber small bowel with no small bowel wall thickening. Clustered small 3 mm calcified appendicoliths near the cecal base. Otherwise normal appendix. Mild sigmoid diverticulosis with no large bowel wall thickening or significant pericolonic fat stranding. Vascular/Lymphatic:  Atherosclerotic nonaneurysmal abdominal aorta. Patent hepatic, portal and renal veins. Slightly decreased moderate extrinsic narrowing of the proximal splenic vein by the pancreatic neoplasm. No pathologically enlarged lymph nodes in the abdomen or pelvis. Reproductive: Prostatectomy. Other: No pneumoperitoneum, ascites or focal fluid collection. Musculoskeletal: No aggressive appearing focal osseous lesions. Moderate lumbar spondylosis. IMPRESSION: 1. Poorly marginated hypodense 3.5 cm distal pancreatic body mass, mildly decreased in size since 06/14/2022 CT. Persistent pancreatic duct dilation and parenchymal atrophy in the pancreatic tail. Slightly decreased moderate extrinsic narrowing of the proximal splenic vein by the pancreatic neoplasm. 2. 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 3. No additional potential sites of metastatic disease in the abdomen or pelvis. 4. Stable basilar right lower lobe 0.4 cm solid pulmonary nodule. 5. No evidence of bowel obstruction or acute bowel inflammation. Mild sigmoid diverticulosis. 6. Nonobstructing bilateral nephrolithiasis. 7.  Aortic Atherosclerosis (ICD10-I70.0). Electronically Signed   By: Ilona Sorrel M.D.   On: 10/01/2022 15:54    Procedures Procedures    Medications Ordered in ED Medications  sodium chloride 0.9 % bolus 1,000 mL (has no administration in time range)  ondansetron (ZOFRAN) injection 4 mg (has no administration in time range)  HYDROmorphone (DILAUDID) injection 1 mg (has no administration in time range)    ED Course/ Medical Decision Making/ A&P                           Medical Decision Making Amount and/or Complexity of Data Reviewed Labs: ordered. Radiology: ordered.  Risk Prescription drug management.    Medical Screen Complete  This patient  presented to the ED with complaint of nausea, vomiting, diarrhea.  This complaint involves an extensive number of treatment options. The initial differential diagnosis includes, but is not limited to, side effect of recent chemotherapy, metabolic abnormality, bacterial versus viral infection  This presentation is: Acute, Chronic, Self-Limited, Previously Undiagnosed, Uncertain Prognosis, Complicated, Systemic Symptoms, and Threat to Life/Bodily Function  Patient with history of pancreatic cancer on active chemo presents with nausea, vomiting, diarrhea.  Patient reports symptoms became significantly uncomfortable around 6 AM.  Patient's exam is reassuring.  Patient's baseline labs are without significant out notably other than elevated white  blood count (secondary to recent neupogen administration)  Imaging is without acute abnormality.  Case briefly discussed with Dr. [name] who agrees with plan of care.  Patient feels significantly improved after ED evaluation and treatment.  He now desires DC home.  He is taking p.o. at time of discharge.  Importance of close follow-up was stressed.  Strict return precautions given and understood.  Additional history obtained:  Additional history obtained from Spouse External records from outside sources obtained and reviewed including prior ED visits and prior Inpatient records.    Lab Tests:  I ordered and personally interpreted labs.  The pertinent results include: CBC, CMP, lipase   Imaging Studies ordered:  I ordered imaging studies including CT abdomen pelvis I independently visualized and interpreted obtained imaging which showed NAD I agree with the radiologist interpretation.   Cardiac Monitoring:  The patient was maintained on a cardiac monitor.  I personally viewed and interpreted the cardiac monitor which showed an underlying rhythm of: NSR   Medicines ordered:  I ordered medication including IV fluids, Dilaudid, Zofran,  Decadron for pain, dehydration, nausea Reevaluation of the patient after these medicines showed that the patient: improved   Problem List / ED Course:  Nausea, vomiting, diarrhea   Reevaluation:  After the interventions noted above, I reevaluated the patient and found that they have: improved   Disposition:  After consideration of the diagnostic results and the patients response to treatment, I feel that the patent would benefit from close outpatient follow-up.          Final Clinical Impression(s) / ED Diagnoses Final diagnoses:  Nausea and vomiting, unspecified vomiting type  Diarrhea, unspecified type    Rx / DC Orders ED Discharge Orders     None         Valarie Merino, MD 10/01/22 1627

## 2022-10-01 NOTE — Telephone Encounter (Signed)
Mrs. Burmester called to report Mr.Kabel is in the ED at [facility].

## 2022-10-01 NOTE — ED Triage Notes (Signed)
Pt arrived via EMS, from home, recent dx of pancreatic CA. Last chemo Friday. Now c/o n/v, diarrhea.

## 2022-10-02 ENCOUNTER — Other Ambulatory Visit: Payer: Self-pay

## 2022-10-02 ENCOUNTER — Emergency Department (HOSPITAL_COMMUNITY): Payer: Medicare HMO

## 2022-10-02 ENCOUNTER — Encounter (HOSPITAL_COMMUNITY): Payer: Self-pay

## 2022-10-02 ENCOUNTER — Inpatient Hospital Stay (HOSPITAL_COMMUNITY)
Admission: EM | Admit: 2022-10-02 | Discharge: 2022-10-08 | DRG: 369 | Disposition: A | Discharge status: Home / Self Care | Payer: Medicare HMO | Attending: Internal Medicine | Admitting: Internal Medicine

## 2022-10-02 ENCOUNTER — Telehealth: Payer: Self-pay

## 2022-10-02 DIAGNOSIS — Z9079 Acquired absence of other genital organ(s): Secondary | ICD-10-CM

## 2022-10-02 DIAGNOSIS — R079 Chest pain, unspecified: Secondary | ICD-10-CM | POA: Diagnosis present

## 2022-10-02 DIAGNOSIS — K209 Esophagitis, unspecified without bleeding: Secondary | ICD-10-CM | POA: Diagnosis present

## 2022-10-02 DIAGNOSIS — K219 Gastro-esophageal reflux disease without esophagitis: Secondary | ICD-10-CM | POA: Diagnosis present

## 2022-10-02 DIAGNOSIS — E1136 Type 2 diabetes mellitus with diabetic cataract: Secondary | ICD-10-CM | POA: Diagnosis present

## 2022-10-02 DIAGNOSIS — E872 Acidosis, unspecified: Secondary | ICD-10-CM | POA: Diagnosis present

## 2022-10-02 DIAGNOSIS — E1165 Type 2 diabetes mellitus with hyperglycemia: Secondary | ICD-10-CM | POA: Diagnosis present

## 2022-10-02 DIAGNOSIS — H409 Unspecified glaucoma: Secondary | ICD-10-CM | POA: Diagnosis present

## 2022-10-02 DIAGNOSIS — C259 Malignant neoplasm of pancreas, unspecified: Secondary | ICD-10-CM | POA: Diagnosis present

## 2022-10-02 DIAGNOSIS — Z8051 Family history of malignant neoplasm of kidney: Secondary | ICD-10-CM

## 2022-10-02 DIAGNOSIS — C251 Malignant neoplasm of body of pancreas: Secondary | ICD-10-CM

## 2022-10-02 DIAGNOSIS — B3781 Candidal esophagitis: Principal | ICD-10-CM | POA: Diagnosis present

## 2022-10-02 DIAGNOSIS — E785 Hyperlipidemia, unspecified: Secondary | ICD-10-CM | POA: Diagnosis present

## 2022-10-02 DIAGNOSIS — Z8042 Family history of malignant neoplasm of prostate: Secondary | ICD-10-CM

## 2022-10-02 DIAGNOSIS — Z1152 Encounter for screening for COVID-19: Secondary | ICD-10-CM

## 2022-10-02 DIAGNOSIS — Z8546 Personal history of malignant neoplasm of prostate: Secondary | ICD-10-CM

## 2022-10-02 DIAGNOSIS — D72829 Elevated white blood cell count, unspecified: Secondary | ICD-10-CM | POA: Diagnosis present

## 2022-10-02 DIAGNOSIS — E869 Volume depletion, unspecified: Secondary | ICD-10-CM | POA: Diagnosis present

## 2022-10-02 DIAGNOSIS — D849 Immunodeficiency, unspecified: Secondary | ICD-10-CM | POA: Diagnosis present

## 2022-10-02 DIAGNOSIS — M159 Polyosteoarthritis, unspecified: Secondary | ICD-10-CM | POA: Diagnosis present

## 2022-10-02 DIAGNOSIS — Z91011 Allergy to milk products: Secondary | ICD-10-CM

## 2022-10-02 DIAGNOSIS — T458X5A Adverse effect of other primarily systemic and hematological agents, initial encounter: Secondary | ICD-10-CM | POA: Diagnosis present

## 2022-10-02 DIAGNOSIS — R109 Unspecified abdominal pain: Secondary | ICD-10-CM

## 2022-10-02 DIAGNOSIS — R739 Hyperglycemia, unspecified: Secondary | ICD-10-CM | POA: Diagnosis present

## 2022-10-02 DIAGNOSIS — I700 Atherosclerosis of aorta: Secondary | ICD-10-CM | POA: Diagnosis present

## 2022-10-02 DIAGNOSIS — E876 Hypokalemia: Secondary | ICD-10-CM | POA: Diagnosis present

## 2022-10-02 DIAGNOSIS — Z87891 Personal history of nicotine dependence: Secondary | ICD-10-CM

## 2022-10-02 DIAGNOSIS — Z888 Allergy status to other drugs, medicaments and biological substances status: Secondary | ICD-10-CM

## 2022-10-02 DIAGNOSIS — Z800 Family history of malignant neoplasm of digestive organs: Secondary | ICD-10-CM

## 2022-10-02 DIAGNOSIS — H269 Unspecified cataract: Secondary | ICD-10-CM | POA: Diagnosis present

## 2022-10-02 DIAGNOSIS — Z79899 Other long term (current) drug therapy: Secondary | ICD-10-CM

## 2022-10-02 HISTORY — DX: Unspecified glaucoma: H40.9

## 2022-10-02 HISTORY — DX: Atherosclerosis of aorta: I70.0

## 2022-10-02 HISTORY — DX: Elevated white blood cell count, unspecified: D72.829

## 2022-10-02 HISTORY — DX: Unspecified cataract: H26.9

## 2022-10-02 LAB — CBC WITH DIFFERENTIAL/PLATELET
Abs Immature Granulocytes: 1.89 K/uL — ABNORMAL HIGH (ref 0.00–0.07)
Basophils Absolute: 0 K/uL (ref 0.0–0.1)
Basophils Relative: 0 %
Eosinophils Absolute: 0 K/uL (ref 0.0–0.5)
Eosinophils Relative: 0 %
HCT: 39.4 % (ref 39.0–52.0)
Hemoglobin: 13.2 g/dL (ref 13.0–17.0)
Immature Granulocytes: 5 %
Lymphocytes Relative: 2 %
Lymphs Abs: 0.7 K/uL (ref 0.7–4.0)
MCH: 29.7 pg (ref 26.0–34.0)
MCHC: 33.5 g/dL (ref 30.0–36.0)
MCV: 88.7 fL (ref 80.0–100.0)
Monocytes Absolute: 0.5 K/uL (ref 0.1–1.0)
Monocytes Relative: 1 %
Neutro Abs: 33.2 K/uL — ABNORMAL HIGH (ref 1.7–7.7)
Neutrophils Relative %: 92 %
Platelets: 171 K/uL (ref 150–400)
RBC: 4.44 MIL/uL (ref 4.22–5.81)
RDW: 18.5 % — ABNORMAL HIGH (ref 11.5–15.5)
WBC: 36.2 K/uL — ABNORMAL HIGH (ref 4.0–10.5)
nRBC: 0 % (ref 0.0–0.2)

## 2022-10-02 LAB — URINALYSIS, ROUTINE W REFLEX MICROSCOPIC
Bilirubin Urine: NEGATIVE
Glucose, UA: >=500 mg/dL — AB
Ketones, ur: 20 mg/dL — AB
Leukocytes,Ua: NEGATIVE
Nitrite: NEGATIVE
Protein, ur: NEGATIVE mg/dL
Specific Gravity, Urine: 1.028 (ref 1.005–1.030)
pH: 7 (ref 5.0–8.0)

## 2022-10-02 LAB — LACTIC ACID, PLASMA
Lactic Acid, Venous: 2.1 mmol/L (ref 0.5–1.9)
Lactic Acid, Venous: 2 mmol/L (ref 0.5–1.9)

## 2022-10-02 LAB — COMPREHENSIVE METABOLIC PANEL WITH GFR
ALT: 37 U/L (ref 0–44)
AST: 33 U/L (ref 15–41)
Albumin: 3.7 g/dL (ref 3.5–5.0)
Alkaline Phosphatase: 131 U/L — ABNORMAL HIGH (ref 38–126)
Anion gap: 8 (ref 5–15)
BUN: 8 mg/dL (ref 8–23)
CO2: 25 mmol/L (ref 22–32)
Calcium: 9.2 mg/dL (ref 8.9–10.3)
Chloride: 105 mmol/L (ref 98–111)
Creatinine, Ser: 0.64 mg/dL (ref 0.61–1.24)
GFR, Estimated: >60 mL/min
Glucose, Bld: 162 mg/dL — ABNORMAL HIGH (ref 70–99)
Potassium: 4.5 mmol/L (ref 3.5–5.1)
Sodium: 138 mmol/L (ref 135–145)
Total Bilirubin: 0.8 mg/dL (ref 0.3–1.2)
Total Protein: 6.5 g/dL (ref 6.5–8.1)

## 2022-10-02 LAB — APTT: aPTT: 22 seconds — ABNORMAL LOW (ref 24–36)

## 2022-10-02 LAB — RESP PANEL BY RT-PCR (FLU A&B, COVID) ARPGX2
Influenza A by PCR: NEGATIVE
Influenza B by PCR: NEGATIVE
SARS Coronavirus 2 by RT PCR: NEGATIVE

## 2022-10-02 LAB — TROPONIN I (HIGH SENSITIVITY)
Troponin I (High Sensitivity): 9 ng/L
Troponin I (High Sensitivity): 9 ng/L (ref <18)

## 2022-10-02 LAB — LIPASE, BLOOD: Lipase: 27 U/L (ref 11–51)

## 2022-10-02 MED ORDER — ONDANSETRON HCL 4 MG PO TABS: 4.0000 mg | ORAL_TABLET | Freq: Four times a day (QID) | ORAL | Status: DC | PRN

## 2022-10-02 MED ORDER — HYDRALAZINE HCL 20 MG/ML IJ SOLN: 20.0000 mg | INTRAMUSCULAR | Status: DC | PRN

## 2022-10-02 MED ORDER — OLOPATADINE HCL 0.1 % OP SOLN: 1.0000 [drp] | OPHTHALMIC | Status: DC | PRN

## 2022-10-02 MED ORDER — LACTATED RINGERS IV BOLUS (SEPSIS)
1000.0000 mL | Freq: Once | INTRAVENOUS | Status: AC
  Administered 2022-10-02: 1000 mL via INTRAVENOUS

## 2022-10-02 MED ORDER — DORZOLAMIDE HCL-TIMOLOL MAL 2-0.5 % OP SOLN
1.0000 [drp] | Freq: Two times a day (BID) | OPHTHALMIC | Status: DC
  Administered 2022-10-02 – 2022-10-08 (×12): 1 [drp] via OPHTHALMIC
  Filled 2022-10-02: qty 10

## 2022-10-02 MED ORDER — LACTATED RINGERS IV SOLN: INTRAVENOUS | Status: AC

## 2022-10-02 MED ORDER — PANTOPRAZOLE SODIUM 40 MG IV SOLR
40.0000 mg | Freq: Two times a day (BID) | INTRAVENOUS | Status: DC
  Administered 2022-10-02 – 2022-10-04 (×5): 40 mg via INTRAVENOUS
  Filled 2022-10-02 (×5): qty 10

## 2022-10-02 MED ORDER — VANCOMYCIN HCL 2000 MG/400ML IV SOLN
2000.0000 mg | INTRAVENOUS | Status: AC
  Administered 2022-10-02: 2000 mg via INTRAVENOUS
  Filled 2022-10-02: qty 400

## 2022-10-02 MED ORDER — FENTANYL CITRATE PF 50 MCG/ML IJ SOSY
50.0000 ug | PREFILLED_SYRINGE | Freq: Once | INTRAMUSCULAR | Status: AC
  Administered 2022-10-02: 50 ug via INTRAVENOUS
  Filled 2022-10-02: qty 1

## 2022-10-02 MED ORDER — ONDANSETRON HCL 4 MG/2ML IJ SOLN
4.0000 mg | Freq: Four times a day (QID) | INTRAMUSCULAR | Status: DC | PRN
  Administered 2022-10-03 – 2022-10-08 (×8): 4 mg via INTRAVENOUS
  Filled 2022-10-02 (×8): qty 2

## 2022-10-02 MED ORDER — DORZOLAMIDE HCL-TIMOLOL MAL PF 2-0.5 % OP SOLN: 1.0000 [drp] | Freq: Two times a day (BID) | OPHTHALMIC | Status: DC

## 2022-10-02 MED ORDER — IOHEXOL 300 MG/ML  SOLN
100.0000 mL | Freq: Once | INTRAMUSCULAR | Status: AC | PRN
  Administered 2022-10-02: 100 mL via INTRAVENOUS

## 2022-10-02 MED ORDER — ONDANSETRON 4 MG PO TBDP
4.0000 mg | ORAL_TABLET | Freq: Once | ORAL | Status: AC | PRN
  Administered 2022-10-02: 4 mg via ORAL
  Filled 2022-10-02: qty 1

## 2022-10-02 MED ORDER — ALUM & MAG HYDROXIDE-SIMETH 200-200-20 MG/5ML PO SUSP
30.0000 mL | Freq: Once | ORAL | Status: AC
  Administered 2022-10-02: 30 mL via ORAL
  Filled 2022-10-02: qty 30

## 2022-10-02 MED ORDER — HYDROMORPHONE HCL 1 MG/ML IJ SOLN
1.0000 mg | INTRAMUSCULAR | Status: DC | PRN
  Administered 2022-10-02 – 2022-10-08 (×12): 1 mg via INTRAVENOUS
  Filled 2022-10-02 (×12): qty 1

## 2022-10-02 MED ORDER — SODIUM CHLORIDE (PF) 0.9 % IJ SOLN
INTRAMUSCULAR | Status: AC
  Filled 2022-10-02: qty 50

## 2022-10-02 MED ORDER — HYDRALAZINE HCL 20 MG/ML IJ SOLN: 10.0000 mg | INTRAMUSCULAR | Status: DC | PRN

## 2022-10-02 MED ORDER — POTASSIUM CHLORIDE CRYS ER 10 MEQ PO TBCR
20.0000 meq | EXTENDED_RELEASE_TABLET | Freq: Every day | ORAL | Status: DC
  Administered 2022-10-03: 20 meq via ORAL
  Filled 2022-10-02 (×3): qty 1

## 2022-10-02 MED ORDER — POLYVINYL ALCOHOL 1.4 % OP SOLN: 1.0000 [drp] | OPHTHALMIC | Status: DC | PRN

## 2022-10-02 MED ORDER — METRONIDAZOLE 500 MG/100ML IV SOLN
500.0000 mg | Freq: Once | INTRAVENOUS | Status: AC
  Administered 2022-10-02: 500 mg via INTRAVENOUS
  Filled 2022-10-02: qty 100

## 2022-10-02 MED ORDER — ACETAMINOPHEN 650 MG RE SUPP: 650.0000 mg | Freq: Four times a day (QID) | RECTAL | Status: DC | PRN

## 2022-10-02 MED ORDER — LIDOCAINE VISCOUS HCL 2 % MT SOLN
15.0000 mL | Freq: Once | OROMUCOSAL | Status: AC
  Administered 2022-10-02: 15 mL via ORAL
  Filled 2022-10-02: qty 15

## 2022-10-02 MED ORDER — ALUM & MAG HYDROXIDE-SIMETH 200-200-20 MG/5ML PO SUSP
30.0000 mL | ORAL | Status: DC | PRN
  Administered 2022-10-03: 30 mL via ORAL
  Filled 2022-10-02: qty 30

## 2022-10-02 MED ORDER — CARBOXYMETHYLCELL-GLYCERIN PF 0.5-0.9 % OP SOLN: 1.0000 [drp] | OPHTHALMIC | Status: DC | PRN

## 2022-10-02 MED ORDER — METOCLOPRAMIDE HCL 5 MG/ML IJ SOLN
5.0000 mg | Freq: Once | INTRAMUSCULAR | Status: AC
  Administered 2022-10-02: 5 mg via INTRAVENOUS
  Filled 2022-10-02: qty 2

## 2022-10-02 MED ORDER — VANCOMYCIN HCL IN DEXTROSE 1-5 GM/200ML-% IV SOLN: 1000.0000 mg | Freq: Once | INTRAVENOUS | Status: DC

## 2022-10-02 MED ORDER — ACETAMINOPHEN 325 MG PO TABS
650.0000 mg | ORAL_TABLET | Freq: Four times a day (QID) | ORAL | Status: DC | PRN
  Administered 2022-10-06: 650 mg via ORAL
  Filled 2022-10-02: qty 2

## 2022-10-02 MED ORDER — SODIUM CHLORIDE 0.9 % IV SOLN
2.0000 g | Freq: Once | INTRAVENOUS | Status: AC
  Administered 2022-10-02: 2 g via INTRAVENOUS
  Filled 2022-10-02: qty 12.5

## 2022-10-02 MED ORDER — PROCHLORPERAZINE EDISYLATE 10 MG/2ML IJ SOLN
10.0000 mg | Freq: Four times a day (QID) | INTRAMUSCULAR | Status: DC | PRN
  Administered 2022-10-02 – 2022-10-03 (×2): 10 mg via INTRAVENOUS
  Filled 2022-10-02 (×2): qty 2

## 2022-10-02 MED ORDER — BRIMONIDINE TARTRATE 0.15 % OP SOLN
1.0000 [drp] | Freq: Two times a day (BID) | OPHTHALMIC | Status: DC
  Administered 2022-10-03 – 2022-10-08 (×11): 1 [drp] via OPHTHALMIC
  Filled 2022-10-02: qty 5

## 2022-10-02 MED ORDER — ONDANSETRON HCL 4 MG/2ML IJ SOLN
4.0000 mg | Freq: Once | INTRAMUSCULAR | Status: AC
  Administered 2022-10-02: 4 mg via INTRAVENOUS
  Filled 2022-10-02: qty 2

## 2022-10-02 NOTE — Progress Notes (Signed)
A consult was received from an ED physician for Vancomycin and Cefepime per pharmacy dosing.  The patient's profile has been reviewed for ht/wt/allergies/indication/available labs.    A one time order has been placed for Vancomycin 2gm IV and Cefepime 2gm IV.    Further antibiotics/pharmacy consults should be ordered by admitting physician if indicated.                       Thank you, Everette Rank, PharmD 10/02/2022  6:46 AM

## 2022-10-02 NOTE — Telephone Encounter (Signed)
[name] called to report Jared [name] went to the [facility] ED for chest pain, throat burning, and vomiting. Dr. [name] is aware of the patient been in the ED.

## 2022-10-02 NOTE — Progress Notes (Signed)
Notified bedside nurse of need to draw repeat lactic acid. 

## 2022-10-02 NOTE — H&P (Signed)
History and Physical    Patient: Jared [name] JEH:[id number] DOB: [birth year] DOA: 10/02/2022 DOS: the patient was seen and examined on 10/02/2022 PCP: [name], MD  Patient coming from: Home  Chief Complaint:  Chief Complaint  Patient presents with   Abdominal Pain   HPI: Jared [name] is a 76 y.o. male with medical history significant of osteoarthritis of multiple sites, cataracts, nephrolithiasis, prostate cancer, GERD, hyperlipidemia, pancreatic cancer receiving his last chemotherapy session on Friday along with Neulasta who is returning for the second day in a row to the emergency department due to epigastric pain radiated to his chest associated with burning sensation in the setting of multiple episodes of nausea, vomiting and diarrhea. No constipation, melena or hematochezia.  No flank pain, dysuria, frequency or hematuria. He denied fever, chills, rhinorrhea, sore throat, wheezing or hemoptysis.  No chest pain, palpitations, diaphoresis, PND, orthopnea or pitting edema of the lower extremities.  No polyuria, polydipsia, polyphagia or blurred vision.   ED course: Initial vital signs were temperature 99.8 F, pulse 50, respiration 20, BP 138/83 mmHg O2 sat 99% on room air.  The patient received LR 1000 mL bolus, fentanyl 50 mcg IVP x 2, ondansetron 4 mg IVP, ondansetron 4 mg ODT tablet, cefepime, metronidazole and vancomycin.  I added Maalox plus viscous lidocaine, pantoprazole 40 mg IVP every 12 hours and switched pain management to hydromorphone.  Lab work: His urinalysis was hazy, with glucosuria more than 500 and ketonuria 20 mg/deciliter, small hemoglobinuria and rare bacteria microscopic examination.  CBC showed a white count of 36.2 with 92% neutrophils, hemoglobin 13.2 g/dL and platelets 171.  PTT was 22.  Coronavirus and influenza PCR was negative.  Troponin was 9 x 2.  Lipase was normal.  CMP showed a glucose of 162 mg/dL and alkaline phosphatase 131 units/L.  Lactic acid  is 2.1 and then 2.0 mmol/L.  Imaging: Portable 1 view chest radiograph with no acute cardiopulmonary finding.  CT abdomen/pelvis with contrast with streaky subsegmental basilar atelectasis, but no infiltrates or effusion.  Heart is normal in size.  No pericardial effusion.  Stable pancreatic body/tail junction region mass with associated atrophy of the pancreatic tail and dilatation of the pancreatic duct.  Stable left adrenal gland lesion, likely adenoma.  Stable bilateral renal calculi.  Status post prostatectomy without findings of metastatic disease.  Aortic atherosclerosis.  Mild thickening of the distal esophagus tach to suggest esophagitis.  Status postcholecystectomy without biliary dilatation.  Please see images and full radiology report for further details.  There was no esophagitis reported on CT scan abdomen/pelvis yesterday.   Review of Systems: As mentioned in the history of present illness. All other systems reviewed and are negative.  Past Medical History:  Diagnosis Date   Arthritis    SHOULDER & KNEES   Cataract    bilateral sx   Chronic kidney disease    pt states he does not have kidney disease, just a hx of kidney stones   History of kidney stones    Inguinal hernia    right   Pancreatic cancer ([place]) 2023   Prostate cancer ([place]) 10/29/1988   Past Surgical History:  Procedure Laterality Date   CATARACT EXTRACTION Bilateral    CHOLECYSTECTOMY N/A 07/14/2013   Procedure: LAPAROSCOPIC CHOLECYSTECTOMY;  Surgeon: Stark Klein, MD;  Location: [facility];  Service: General;  Laterality: N/A;   COLONOSCOPY  01/07/2004   Dr. Silvano Rusk   COLONOSCOPY  02/2022   CG-MAC-mira(good)-tics-2 polyps   ENDOSCOPIC  RETROGRADE CHOLANGIOPANCREATOGRAPHY (ERCP) WITH PROPOFOL N/A 08/23/2022   Procedure: ENDOSCOPIC RETROGRADE CHOLANGIOPANCREATOGRAPHY (ERCP) WITH PROPOFOL;  Surgeon: Rush Landmark Telford Nab., MD;  Location: [facility];  Service: Gastroenterology;  Laterality: N/A;    ESOPHAGOGASTRODUODENOSCOPY (EGD) WITH PROPOFOL N/A 07/05/2022   Procedure: ESOPHAGOGASTRODUODENOSCOPY (EGD) WITH PROPOFOL;  Surgeon: Rush Landmark Telford Nab., MD;  Location: [place];  Service: Gastroenterology;  Laterality: N/A;   EUS N/A 07/05/2022   Procedure: UPPER ENDOSCOPIC ULTRASOUND (EUS) RADIAL;  Surgeon: Irving Copas., MD;  Location: [place];  Service: Gastroenterology;  Laterality: N/A;   EYE SURGERY     FINE NEEDLE ASPIRATION  07/05/2022   Procedure: FINE NEEDLE ASPIRATION (FNA) LINEAR;  Surgeon: Irving Copas., MD;  Location: [place];  Service: Gastroenterology;;   INGUINAL HERNIA REPAIR Right    PORTACATH PLACEMENT Left 07/31/2022   Procedure: INSERTION PORT-A-CATH;  Surgeon: Stark Klein, MD;  Location: [place];  Service: General;  Laterality: Left;   PROSTATECTOMY     REMOVAL OF STONES  08/23/2022   Procedure: REMOVAL OF STONES;  Surgeon: Irving Copas., MD;  Location: [facility];  Service: Gastroenterology;;   Jared [name]  08/23/2022   Procedure: Jared [name];  Surgeon: Irving Copas., MD;  Location: [facility];  Service: Gastroenterology;;   TONSILLECTOMY     Social History:  reports that he quit smoking about 40 years ago. His smoking use included cigarettes. He has a 10.00 pack-year smoking history. He has never used smokeless tobacco. He reports that he does not drink alcohol and does not use drugs.  Allergies  Allergen Reactions   Claritin-D 24 Hour [Loratadine-Pseudoephedrine Er] Other (See Comments)    Eye pressure issues   Milk-Related Compounds Other (See Comments)    INDIGESTION. GI upset   Lupron [Leuprolide] Itching and Other (See Comments)    Caused infection on hip     Family History  Problem Relation Age of Onset   Pancreatic cancer Mother    Kidney cancer Mother    Prostate cancer Father    Prostate cancer Brother    Prostate cancer Paternal Uncle    Prostate cancer Paternal Uncle    Prostate  cancer Paternal Uncle    Prostate cancer Paternal Uncle    Prostate cancer Paternal Grandfather    Colon cancer Neg Hx    Stomach cancer Neg Hx    Esophageal cancer Neg Hx    Colon polyps Neg Hx    Rectal cancer Neg Hx     Prior to Admission medications   Medication Sig Start Date End Date Taking? Authorizing Provider  acetaminophen (TYLENOL) 325 MG tablet Take 325 mg by mouth daily as needed for headache. Patient not taking: Reported on 08/29/2022    [provider]  Ascorbic Acid (VITAMIN C) 500 MG tablet Take 500 mg by mouth daily.      [provider]  brimonidine (ALPHAGAN) 0.15 % ophthalmic solution Place 1 drop into the left eye in the morning and at bedtime.    [provider]  Calcium Carbonate Antacid (TUMS ULTRA 1000 PO) Take 2,000-3,000 mg by mouth See admin instructions. 3000 mg by mouth in the morning and 2000 mg by mouth at night    [provider]  Carboxymethylcell-Glycerin PF (REFRESH OPTIVE PF) 0.5-0.9 % SOLN Place 1 drop into both eyes as needed (dry eyes). 04/28/18   [provider]  cholecalciferol (VITAMIN D3) 25 MCG (1000 UNIT) tablet Take 1,000 Units by mouth daily.    [provider]  Dorzolamide HCl-Timolol Mal  PF 2-0.5 % SOLN Place 1 drop into the left eye 2 (two) times daily. 12/01/21   [provider]  fluconazole (DIFLUCAN) 100 MG tablet Take 1 tablet (100 mg total) by mouth daily. 09/24/22   Ladell Pier, MD  Garlic 9381 MG CAPS Take 1,000 mg by mouth daily.    [provider]  GEMTESA 75 MG TABS Take 75 mg by mouth daily. 01/31/22   [provider]  HYDROmorphone (DILAUDID) 4 MG tablet Take 1 tablet (4 mg total) by mouth every 6 (six) hours as needed for severe pain. 09/26/22   Ladell Pier, MD  ibuprofen (ADVIL,MOTRIN) 800 MG tablet Take 1 tablet (800 mg total) by mouth every 8 (eight) hours as needed. Patient not taking: Reported on 08/29/2022 09/09/18   [name],  MD  lidocaine-prilocaine (EMLA) cream Apply 1 Application topically as needed. 07/27/22   Ladell Pier, MD  olopatadine (PATANOL) 0.1 % ophthalmic solution Place 1 drop into both eyes as needed for allergies.    [provider]  ondansetron (ZOFRAN) 8 MG tablet Take 1 tablet (8 mg total) by mouth every 8 (eight) hours as needed for nausea or vomiting (Starting day 3 after chemo as needed for nausea(received Aloxi with chemo)). 09/10/22   Ladell Pier, MD  potassium chloride SA (KLOR-CON M) 20 MEQ tablet Take 1 tablet (20 mEq total) by mouth daily. 09/26/22   Ladell Pier, MD  prochlorperazine (COMPAZINE) 10 MG tablet Take 1 tablet (10 mg total) by mouth every 6 (six) hours as needed for nausea or vomiting. 07/25/22   Owens Shark, NP  sodium chloride (AYR) 0.65 % nasal spray Place 2-3 sprays into the nose as needed for congestion.    [provider]  Triptorelin Pamoate 11.25 MG SUSR Inject 11.25 mg into the muscle as needed (PSA).    [provider]    Physical Exam: Vitals:   10/02/22 0433 10/02/22 0457 10/02/22 0500 10/02/22 0700  BP:  138/83 138/83 (!) 179/89  Pulse:  (!) 50 (!) 51 (!) 51  Resp:  _0 Temp:  99.8 F (37.7 C)    TempSrc:  Oral    SpO2: 98% 99% 100% 100%   Physical Exam Vitals and nursing note reviewed.  Constitutional:      General: He is awake. He is not in acute distress.    Appearance: He is obese.  HENT:     Head: Normocephalic.     Nose: No rhinorrhea.     Mouth/Throat:     Mouth: Mucous membranes are dry.  Eyes:     General: No scleral icterus.    Pupils: Pupils are equal, round, and reactive to light.  Neck:     Vascular: No JVD.  Cardiovascular:     Rate and Rhythm: Regular rhythm. Bradycardia present.     Heart sounds: S1 normal and S2 normal.  Pulmonary:     Effort: Pulmonary effort is normal.     Breath sounds: Normal breath sounds. No wheezing, rhonchi or rales.  Abdominal:     General: Abdomen is  flat. Bowel sounds are normal.     Palpations: Abdomen is soft.     Tenderness: There is no abdominal tenderness. There is no right CVA tenderness, guarding or rebound.  Musculoskeletal:     Cervical back: Neck supple.     Right lower leg: No edema.     Left lower leg: No edema.  Skin:  General: Skin is warm and dry.  Neurological:     General: No focal deficit present.     Mental Status: He is alert and oriented to person, place, and time.  Psychiatric:        Mood and Affect: Mood normal.        Behavior: Behavior normal. Behavior is cooperative.   Data Reviewed:  There are no new results to review at this time.  Assessment and Plan: Principal Problem:   Chest pain Secondary to:   Esophagitis Superimposed on:   GERD He also had gastritis on EGD a few months ago. Will keep n.p.o. for now. Continue IV fluids. Advance diet as tolerated. Pantoprazole 40 mg IVP every 12 hours. Maalox plus viscous lidocaine every 4 hours as needed. Continue hydromorphone 1 mg IVP every 3 hours as needed. Will need a PPI on discharge.  Active Problems:   Hyperlipidemia   Aortic atherosclerosis ([place]) Currently not on medical therapy. Will defer initiating therapy at this time given GI symptoms. Follow-up with primary care provider and/or oncology.    Cancer of pancreas, body ([place]) Bailey Mech for the past few days. Will hold today until symptoms resolve.    Leukocytosis Secondary to CFS administration. Defer further IV antibiotics. Follow-up WBC in the morning.    Lactic acidosis Minimal. Secondary to volume depletion. Continue IV fluids.    Hyperglycemia Follow-up fasting glucose in AM.    Bilateral cataracts/Glaucoma  Continue brimonidine, dorzolamide and timolol drops. Follow-up with ophthalmology as an outpatient.     Advance Care Planning:   Code Status: Full Code   Consults:   Family Communication:   Severity of Illness: The appropriate patient status for  this patient is OBSERVATION. Observation status is judged to be reasonable and necessary in order to provide the required intensity of service to ensure the patient's safety. The patient's presenting symptoms, physical exam findings, and initial radiographic and laboratory data in the context of their medical condition is felt to place them at decreased risk for further clinical deterioration. Furthermore, it is anticipated that the patient will be medically stable for discharge from the hospital within 2 midnights of admission.   Author: Reubin Milan, MD 10/02/2022 8:37 AM  For on call review [email or web address].   This document was prepared using Dragon voice recognition software and may contain some unintended transcription errors.

## 2022-10-02 NOTE — ED Provider Notes (Signed)
[facility] DEPT Provider Note   CSN: [id number] Arrival date & time: 10/02/22  0425     History  Chief Complaint  Patient presents with   Abdominal Pain   HPI Jared [name] is a 76 y.o. male with pancreatic cancer, prostate cancer presenting for chest pain.  Started at 3 AM this morning.  Chest pain is midsternal and radiates at times down into his upper abdomen.  Feels like his throat is also burning.  Chest pain is worse with vomiting.  Pain is constant.  States that he is vomited multiple times this morning along with diarrhea.  Has taken no medications for symptoms.  States he has states due to pancreatic cancer and finished his fifth cycle of chemotherapy on Friday of last week.  He was evaluated here yesterday for abdominal pain.   Abdominal Pain      Home Medications Prior to Admission medications   Medication Sig Start Date End Date Taking? Authorizing Provider  acetaminophen (TYLENOL) 325 MG tablet Take 325 mg by mouth daily as needed for headache.   Yes [provider]  Ascorbic Acid (VITAMIN C) 500 MG tablet Take 500 mg by mouth daily.     Yes [provider]  brimonidine (ALPHAGAN) 0.15 % ophthalmic solution Place 1 drop into the left eye in the morning and at bedtime.   Yes [provider]  Calcium Carbonate Antacid (TUMS ULTRA 1000 PO) Take 2,000-3,000 mg by mouth See admin instructions. 3000 mg by mouth in the morning and 2000 mg by mouth at night   Yes [provider]  Carboxymethylcell-Glycerin PF (REFRESH OPTIVE PF) 0.5-0.9 % SOLN Place 1 drop into both eyes as needed (dry eyes). 04/28/18  Yes [provider]  cholecalciferol (VITAMIN D3) 25 MCG (1000 UNIT) tablet Take 1,000 Units by mouth daily.   Yes [provider]  Dorzolamide HCl-Timolol Mal PF 2-0.5 % SOLN Place 1 drop into the left eye 2 (two) times daily. 12/01/21  Yes [provider]  fluconazole (DIFLUCAN) 100 MG  tablet Take 1 tablet (100 mg total) by mouth daily. Patient taking differently: Take 100 mg by mouth daily as needed (thrush). 09/24/22  Yes Ladell Pier, MD  Garlic 9562 MG CAPS Take 1,000 mg by mouth daily.   Yes [provider]  GEMTESA 75 MG TABS Take 75 mg by mouth daily. 01/31/22  Yes [provider]  HYDROmorphone (DILAUDID) 4 MG tablet Take 1 tablet (4 mg total) by mouth every 6 (six) hours as needed for severe pain. 09/26/22  Yes Ladell Pier, MD  ibuprofen (ADVIL,MOTRIN) 800 MG tablet Take 1 tablet (800 mg total) by mouth every 8 (eight) hours as needed. 09/09/18  Yes Chambliss, Jeb Levering, MD  lidocaine-prilocaine (EMLA) cream Apply 1 Application topically as needed. 07/27/22  Yes Ladell Pier, MD  olopatadine (PATANOL) 0.1 % ophthalmic solution Place 1 drop into both eyes as needed for allergies.   Yes [provider]  ondansetron (ZOFRAN) 8 MG tablet Take 1 tablet (8 mg total) by mouth every 8 (eight) hours as needed for nausea or vomiting (Starting day 3 after chemo as needed for nausea(received Aloxi with chemo)). 09/10/22  Yes Ladell Pier, MD  potassium chloride SA (KLOR-CON M) 20 MEQ tablet Take 1 tablet (20 mEq total) by mouth daily. 09/26/22  Yes Ladell Pier, MD  prochlorperazine (COMPAZINE) 10 MG tablet Take 1 tablet (10 mg total) by mouth every 6 (six) hours as  needed for nausea or vomiting. 07/25/22  Yes Owens Shark, NP  sodium chloride (AYR) 0.65 % nasal spray Place 2-3 sprays into the nose as needed for congestion.   Yes [provider]  Triptorelin Pamoate 11.25 MG SUSR Inject 11.25 mg into the muscle as needed (PSA).   Yes [provider]      Allergies    Claritin-d 24 hour [loratadine-pseudoephedrine er], Milk-related compounds, and Lupron [leuprolide]    Review of Systems   Review of Systems  Gastrointestinal:  Positive for abdominal pain.    Physical Exam Updated Vital Signs BP 95/67   Pulse 60    Temp 97.8 F (36.6 C) (Oral)   Resp 14   SpO2 100%  Physical Exam Vitals and nursing note reviewed.  HENT:     Head: Normocephalic and atraumatic.     Mouth/Throat:     Mouth: Mucous membranes are moist.  Eyes:     General:        Right eye: No discharge.        Left eye: No discharge.     Conjunctiva/sclera: Conjunctivae normal.  Cardiovascular:     Rate and Rhythm: Regular rhythm. Bradycardia present.     Pulses: Normal pulses.     Heart sounds: Normal heart sounds.  Pulmonary:     Effort: Pulmonary effort is normal.     Breath sounds: Normal breath sounds.  Abdominal:     General: Abdomen is flat.     Palpations: Abdomen is soft.     Tenderness: There is abdominal tenderness in the epigastric area.  Skin:    General: Skin is warm and dry.  Neurological:     General: No focal deficit present.  Psychiatric:        Mood and Affect: Mood normal.     ED Results / Procedures / Treatments   Labs (all labs ordered are listed, but only abnormal results are displayed) Labs Reviewed  COMPREHENSIVE METABOLIC PANEL - Abnormal; Notable for the following components:      Result Value   Glucose, Bld 162 (*)    Alkaline Phosphatase 131 (*)    All other components within normal limits  URINALYSIS, ROUTINE W REFLEX MICROSCOPIC - Abnormal; Notable for the following components:   APPearance HAZY (*)    Glucose, UA >=500 (*)    Hgb urine dipstick SMALL (*)    Ketones, ur 20 (*)    Bacteria, UA RARE (*)    All other components within normal limits  CBC WITH DIFFERENTIAL/PLATELET - Abnormal; Notable for the following components:   WBC 36.2 (*)    RDW 18.5 (*)    Neutro Abs 33.2 (*)    Abs Immature Granulocytes 1.89 (*)    All other components within normal limits  LACTIC ACID, PLASMA - Abnormal; Notable for the following components:   Lactic Acid, Venous 2.1 (*)    All other components within normal limits  LACTIC ACID, PLASMA - Abnormal; Notable for the following components:    Lactic Acid, Venous 2.0 (*)    All other components within normal limits  APTT - Abnormal; Notable for the following components:   aPTT 22 (*)    All other components within normal limits  RESP PANEL BY RT-PCR (FLU A&B, COVID) [id number]  CULTURE, BLOOD (ROUTINE X 2)  CULTURE, BLOOD (ROUTINE X 2)  URINE CULTURE  LIPASE, BLOOD  TROPONIN I (HIGH SENSITIVITY)  TROPONIN I (HIGH SENSITIVITY)    EKG None  Radiology CT  ABDOMEN PELVIS W CONTRAST  Result Date: 10/02/2022 CLINICAL DATA:  Epigastric abdominal pain with nausea, vomiting and diarrhea. Recent diagnosis of pancreatic cancer. EXAM: CT ABDOMEN AND PELVIS WITH CONTRAST TECHNIQUE: Multidetector CT imaging of the abdomen and pelvis was performed using the standard protocol following bolus administration of intravenous contrast. RADIATION DOSE REDUCTION: This exam was performed according to the departmental dose-optimization program which includes automated exposure control, adjustment of the mA and/or kV according to patient size and/or use of iterative reconstruction technique. CONTRAST:  147m OMNIPAQUE IOHEXOL 300 MG/ML  SOLN COMPARISON:  CT scan 07/15/2022 and 10/01/2022 FINDINGS: Lower chest: Streaky subsegmental basilar atelectasis but no infiltrates or effusions. The heart is normal in size. No pericardial effusion. Mild thickening of the distal esophagus could suggest esophagitis. Hepatobiliary: No hepatic lesions to suggest metastatic disease. The gallbladder is surgically absent. No intra or extrahepatic biliary dilatation. Pancreas: Stable pancreatic body/tail junction region mass. Associated atrophy of the pancreatic tail and dilatation of the pancreatic duct. The pancreatic head is unremarkable and stable. Spleen: Normal size. No focal lesions. Adrenals/Urinary Tract: Stable 15 mm left adrenal gland lesions unchanged since 2021 and likely benign adenoma. The right adrenal gland is normal. No renal lesions or hydronephrosis. Stable  bilateral renal calculi. The bladder is unremarkable. It does contain contrast from yesterday CT scan. Stomach/Bowel: The stomach, duodenum, small bowel and colon are unremarkable. No acute inflammatory changes, mass lesions or obstructive findings. Vascular/Lymphatic: Stable scattered atherosclerotic calcifications involving the aorta and iliac arteries but no aneurysm or dissection. The aortic branch vessels are patent. The major venous structures are patent. Reproductive: Status post prostatectomy. Other: No pelvic mass or adenopathy. No free pelvic fluid collections. No inguinal mass or adenopathy. No abdominal wall hernia or subcutaneous lesions. Musculoskeletal: No significant bony findings. Stable sclerotic lesion in the sacrum likely benign bone island. IMPRESSION: 1. Stable pancreatic body/tail junction region mass with associated atrophy of the pancreatic tail and dilatation of the pancreatic duct. 2. Stable left adrenal gland lesion, likely benign adenoma. 3. Stable bilateral renal calculi. 4. Status post prostatectomy. No findings for metastatic disease. 5. Status post cholecystectomy. No biliary dilatation. 6. Mild thickening of the distal esophagus could suggest esophagitis. 7. Aortic atherosclerosis. Aortic Atherosclerosis (ICD10-I70.0). Electronically Signed   By: PMarijo SanesM.D.   On: 10/02/2022 07:56   DG Chest Port 1 View  Result Date: 10/02/2022 CLINICAL DATA:  Chest burning. EXAM: PORTABLE CHEST 1 VIEW COMPARISON:  07/31/2022 FINDINGS: Stable asymmetric elevation left hemidiaphragm. Left Port-A-Cath again noted. The lungs are clear without focal pneumonia, edema, pneumothorax or pleural effusion. Cardiopericardial silhouette is at upper limits of normal for size. Bones are diffusely demineralized. IMPRESSION: Stable exam. No acute cardiopulmonary findings. Electronically Signed   By: EMisty StanleyM.D.   On: 10/02/2022 06:20   CT ABDOMEN PELVIS W CONTRAST  Result Date:  10/01/2022 CLINICAL DATA:  Pancreatic cancer diagnosed 07/05/2022 status post chemotherapy. Remote prostatectomy for prostate cancer. Abdominal pain, nausea and vomiting. * Tracking Code: BO * EXAM: CT ABDOMEN AND PELVIS WITH CONTRAST TECHNIQUE: Multidetector CT imaging of the abdomen and pelvis was performed using the standard protocol following bolus administration of intravenous contrast. RADIATION DOSE REDUCTION: This exam was performed according to the departmental dose-optimization program which includes automated exposure control, adjustment of the mA and/or kV according to patient size and/or use of iterative reconstruction technique. CONTRAST:  1061mOMNIPAQUE IOHEXOL 300 MG/ML  SOLN COMPARISON:  06/14/2022 CT abdomen/pelvis. FINDINGS: Lower chest: Basilar right lower lobe 0.4 cm  solid pulmonary nodule (series 5/image 26), unchanged. No acute abnormality at the lung bases. Hepatobiliary: Normal liver size. No liver mass. Cholecystectomy. No biliary ductal dilatation. Pancreas: Poorly marginated hypodense 3.5 x 2.6 cm distal pancreatic body mass (series 2/image 25), mildly decreased from 3.9 x 3.3 cm. Persistent pancreatic duct dilation (6 mm diameter) and parenchymal atrophy in the pancreatic tail. Spleen: Normal size. No mass. Adrenals/Urinary Tract: Heterogeneous 2.8 x 2.0 cm left adrenal nodule with density 33 HU, increased from 2.3 x 1.6 cm and newly heterogeneous in density. Normal right adrenal. A few scattered nonobstructing right renal stones, largest 6 mm in the lower right kidney. Nonobstructing 3 mm lower left renal stone. No hydronephrosis. Simple 1.3 cm lower left renal cyst, for which no follow-up imaging is recommended. Normal bladder. Stomach/Bowel: Normal non-distended stomach. Normal caliber small bowel with no small bowel wall thickening. Clustered small 3 mm calcified appendicoliths near the cecal base. Otherwise normal appendix. Mild sigmoid diverticulosis with no large bowel wall  thickening or significant pericolonic fat stranding. Vascular/Lymphatic: Atherosclerotic nonaneurysmal abdominal aorta. Patent hepatic, portal and renal veins. Slightly decreased moderate extrinsic narrowing of the proximal splenic vein by the pancreatic neoplasm. No pathologically enlarged lymph nodes in the abdomen or pelvis. Reproductive: Prostatectomy. Other: No pneumoperitoneum, ascites or focal fluid collection. Musculoskeletal: No aggressive appearing focal osseous lesions. Moderate lumbar spondylosis. IMPRESSION: 1. Poorly marginated hypodense 3.5 cm distal pancreatic body mass, mildly decreased in size since 06/14/2022 CT. Persistent pancreatic duct dilation and parenchymal atrophy in the pancreatic tail. Slightly decreased moderate extrinsic narrowing of the proximal splenic vein by the pancreatic neoplasm. 2. 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 3. No additional potential sites of metastatic disease in the abdomen or pelvis. 4. Stable basilar right lower lobe 0.4 cm solid pulmonary nodule. 5. No evidence of bowel obstruction or acute bowel inflammation. Mild sigmoid diverticulosis. 6. Nonobstructing bilateral nephrolithiasis. 7.  Aortic Atherosclerosis (ICD10-I70.0). Electronically Signed   By: Ilona Sorrel M.D.   On: 10/01/2022 15:54    Procedures .Critical Care  Performed by: Harriet Pho, PA-C Authorized by: Harriet Pho, PA-C   Critical care provider statement:    Critical care time (minutes):  30   Critical care was necessary to treat or prevent imminent or life-threatening deterioration of the following conditions:  Sepsis   Critical care was time spent personally by me on the following activities:  Blood draw for specimens, evaluation of patient's response to treatment, examination of patient,  discussions with consultants, obtaining history from patient or surrogate, ordering and performing treatments and interventions, ordering and review of laboratory studies, ordering and review of radiographic studies, pulse oximetry, re-evaluation of patient's condition and review of old charts     Medications Ordered in ED Medications  lactated ringers infusion ( Intravenous New Bag/Given 10/02/22 1313)  pantoprazole (PROTONIX) injection 40 mg (40 mg Intravenous Given 10/02/22 0923)  acetaminophen (TYLENOL) tablet 650 mg (has no administration in time range)    Or  acetaminophen (TYLENOL) suppository 650 mg (has no administration in time range)  ondansetron (ZOFRAN) tablet 4 mg (has no administration in time range)    Or  ondansetron (ZOFRAN) injection 4 mg (has no administration in time range)  HYDROmorphone (DILAUDID) injection 1 mg (1 mg Intravenous Given 10/02/22 0922)  prochlorperazine (COMPAZINE) injection 10 mg (10 mg Intravenous Given 10/02/22 0924)  alum &  mag hydroxide-simeth (MAALOX/MYLANTA) 200-200-20 MG/5ML suspension 30 mL (has no administration in time range)    And  lidocaine (XYLOCAINE) 2 % viscous mouth solution 15 mL (has no administration in time range)  brimonidine (ALPHAGAN) 0.15 % ophthalmic solution 1 drop (has no administration in time range)  Carboxymethylcell-Glycerin PF 0.5-0.9 % SOLN 1 drop (has no administration in time range)  Dorzolamide HCl-Timolol Mal PF 2-0.5 % SOLN 1 drop (has no administration in time range)  potassium chloride SA (KLOR-CON M) CR tablet 20 mEq (has no administration in time range)  olopatadine (PATANOL) 0.1 % ophthalmic solution 1 drop (has no administration in time range)  ondansetron (ZOFRAN-ODT) disintegrating tablet 4 mg (4 mg Oral Given 10/02/22 0537)  lactated ringers bolus 1,000 mL (0 mLs Intravenous Stopped 10/02/22 0756)  ceFEPIme (MAXIPIME) 2 g in sodium chloride 0.9 % 100 mL IVPB (0 g Intravenous Stopped 10/02/22 0736)   metroNIDAZOLE (FLAGYL) IVPB 500 mg (0 mg Intravenous Stopped 10/02/22 0936)  vancomycin (VANCOREADY) IVPB 2000 mg/400 mL (0 mg Intravenous Stopped 10/02/22 1135)  fentaNYL (SUBLIMAZE) injection 50 mcg (50 mcg Intravenous Given 10/02/22 0705)  ondansetron (ZOFRAN) injection 4 mg (4 mg Intravenous Given 10/02/22 0705)  sodium chloride (PF) 0.9 % injection (  Given by Other 10/02/22 1038)  iohexol (OMNIPAQUE) 300 MG/ML solution 100 mL (100 mLs Intravenous Contrast Given 10/02/22 0724)  fentaNYL (SUBLIMAZE) injection 50 mcg (50 mcg Intravenous Given 10/02/22 0832)  metoCLOPramide (REGLAN) injection 5 mg (5 mg Intravenous Given 10/02/22 0830)  alum & mag hydroxide-simeth (MAALOX/MYLANTA) 200-200-20 MG/5ML suspension 30 mL (30 mLs Oral Given 10/02/22 0930)    And  lidocaine (XYLOCAINE) 2 % viscous mouth solution 15 mL (15 mLs Oral Given 10/02/22 0930)    ED Course/ Medical Decision Making/ A&P                           Medical Decision Making Amount and/or Complexity of Data Reviewed Labs: ordered. Radiology: ordered. ECG/medicine tests: ordered.  Risk Prescription drug management. Decision regarding hospitalization.   Initial Impression and Ddx 76 year old male with pancreatic cancer is ill-appearing but hemodynamically stable presenting for chest pain.  Differential diagnosis for this complaint includes ACS, PE, pneumonia, and sepsis.  Physical exam notable for bradycardia and epigastric tenderness. Patient PMH that increases complexity of ED encounter: Pancreatic cancer, GERD, hyperlipidemia, diabetes  Interpretation of Diagnostics I independent reviewed and interpreted the labs as followed: Lactic acidosis, bacteruria, leukocytosis and hyperglycemia  - I independently visualized the following imaging with scope of interpretation limited to determining acute life threatening conditions related to emergency care: CT scan of abdomen pelvis, which revealed distal esophageal thickening, bilateral  renal calculi, and left adrenal gland lesion  Patient Reassessment and Ultimate Disposition/Management Treated pain with fentanyl and nausea with Zofran.  Reassessment revealed that patient was still in pain and still nauseous, gave another dose of fentanyl and administered Reglan.  Patient stated that both pain and nausea had improved slightly.  Patient was evaluated yesterday for similar complaint and was sent home with pain management. Given failed pain control at home and concern for sepsis on presentation, admitted to hospital service for further evaluation and management. CT scan today did reveal new distal thickening of his esophagus concerning for esophagitis which could be contributing to his pain.   Patient management required discussion with the following services or consulting groups:  Hospitalist Service  Complexity of Problems Addressed Acute complicated illness or Injury  Additional Data Reviewed  and Analyzed Further history obtained from: Further history from spouse/family member, Prior ED visit notes, Recent discharge summary, and Recent Consult notes  Patient Encounter Risk Assessment Consideration of hospitalization         Final Clinical Impression(s) / ED Diagnoses Final diagnoses:  Chest pain, unspecified type  Abdominal pain, unspecified abdominal location    Rx / DC Orders ED Discharge Orders     None         Harriet Pho, PA-C 10/02/22 1519    Palumbo, April, MD 10/02/22 2326

## 2022-10-02 NOTE — ED Notes (Signed)
Date and time results received: 10/02/22 7:33 AM  (use smartphrase ".now" to insert current time)  Test: Lactic acid Critical Value: 2.1  Name of Provider Notified: dr. Tomi Bamberger  Orders Received? Or Actions Taken?: see chart

## 2022-10-02 NOTE — Progress Notes (Signed)
Elink following code sepsis °

## 2022-10-02 NOTE — ED Triage Notes (Addendum)
Arrives EMS from home with epigastric pain that radiates to chest and describes as a burning sensation. Still having n/v/d. Recently dx with pancreatic cancer. Chemo this past Friday.   Sts he was seen at [facility] yesterday and discharged  home.

## 2022-10-02 NOTE — ED Notes (Signed)
Pt vomiting screaming that he is choking.

## 2022-10-02 NOTE — ED Notes (Signed)
Pt states he needs to have BM. Pt is able to ambulate to restroom w/out assistance. Instructed pt to use call cord when he is finished and we will show him back to his room.

## 2022-10-02 NOTE — ED Notes (Signed)
Used restroom just prior to

## 2022-10-03 DIAGNOSIS — Z8042 Family history of malignant neoplasm of prostate: Secondary | ICD-10-CM | POA: Diagnosis not present

## 2022-10-03 DIAGNOSIS — Z8546 Personal history of malignant neoplasm of prostate: Secondary | ICD-10-CM | POA: Diagnosis not present

## 2022-10-03 DIAGNOSIS — H409 Unspecified glaucoma: Secondary | ICD-10-CM | POA: Diagnosis present

## 2022-10-03 DIAGNOSIS — E1136 Type 2 diabetes mellitus with diabetic cataract: Secondary | ICD-10-CM | POA: Diagnosis present

## 2022-10-03 DIAGNOSIS — T458X5A Adverse effect of other primarily systemic and hematological agents, initial encounter: Secondary | ICD-10-CM | POA: Diagnosis present

## 2022-10-03 DIAGNOSIS — Z87891 Personal history of nicotine dependence: Secondary | ICD-10-CM | POA: Diagnosis not present

## 2022-10-03 DIAGNOSIS — Z800 Family history of malignant neoplasm of digestive organs: Secondary | ICD-10-CM | POA: Diagnosis not present

## 2022-10-03 DIAGNOSIS — R1013 Epigastric pain: Secondary | ICD-10-CM | POA: Diagnosis present

## 2022-10-03 DIAGNOSIS — E785 Hyperlipidemia, unspecified: Secondary | ICD-10-CM | POA: Diagnosis present

## 2022-10-03 DIAGNOSIS — K209 Esophagitis, unspecified without bleeding: Secondary | ICD-10-CM

## 2022-10-03 DIAGNOSIS — Z888 Allergy status to other drugs, medicaments and biological substances status: Secondary | ICD-10-CM | POA: Diagnosis not present

## 2022-10-03 DIAGNOSIS — E872 Acidosis, unspecified: Secondary | ICD-10-CM

## 2022-10-03 DIAGNOSIS — D72829 Elevated white blood cell count, unspecified: Secondary | ICD-10-CM

## 2022-10-03 DIAGNOSIS — E869 Volume depletion, unspecified: Secondary | ICD-10-CM | POA: Diagnosis present

## 2022-10-03 DIAGNOSIS — E1165 Type 2 diabetes mellitus with hyperglycemia: Secondary | ICD-10-CM | POA: Diagnosis present

## 2022-10-03 DIAGNOSIS — M159 Polyosteoarthritis, unspecified: Secondary | ICD-10-CM | POA: Diagnosis present

## 2022-10-03 DIAGNOSIS — R079 Chest pain, unspecified: Secondary | ICD-10-CM | POA: Diagnosis not present

## 2022-10-03 DIAGNOSIS — B3781 Candidal esophagitis: Secondary | ICD-10-CM | POA: Diagnosis present

## 2022-10-03 DIAGNOSIS — D849 Immunodeficiency, unspecified: Secondary | ICD-10-CM | POA: Diagnosis present

## 2022-10-03 DIAGNOSIS — Z9079 Acquired absence of other genital organ(s): Secondary | ICD-10-CM | POA: Diagnosis not present

## 2022-10-03 DIAGNOSIS — I700 Atherosclerosis of aorta: Secondary | ICD-10-CM | POA: Diagnosis present

## 2022-10-03 DIAGNOSIS — Z91011 Allergy to milk products: Secondary | ICD-10-CM | POA: Diagnosis not present

## 2022-10-03 DIAGNOSIS — R112 Nausea with vomiting, unspecified: Secondary | ICD-10-CM | POA: Diagnosis not present

## 2022-10-03 DIAGNOSIS — K219 Gastro-esophageal reflux disease without esophagitis: Secondary | ICD-10-CM

## 2022-10-03 DIAGNOSIS — C251 Malignant neoplasm of body of pancreas: Secondary | ICD-10-CM | POA: Diagnosis not present

## 2022-10-03 DIAGNOSIS — Z1152 Encounter for screening for COVID-19: Secondary | ICD-10-CM | POA: Diagnosis not present

## 2022-10-03 DIAGNOSIS — H269 Unspecified cataract: Secondary | ICD-10-CM | POA: Diagnosis not present

## 2022-10-03 DIAGNOSIS — Z8051 Family history of malignant neoplasm of kidney: Secondary | ICD-10-CM | POA: Diagnosis not present

## 2022-10-03 DIAGNOSIS — C259 Malignant neoplasm of pancreas, unspecified: Secondary | ICD-10-CM | POA: Diagnosis present

## 2022-10-03 DIAGNOSIS — E876 Hypokalemia: Secondary | ICD-10-CM | POA: Diagnosis present

## 2022-10-03 DIAGNOSIS — Z79899 Other long term (current) drug therapy: Secondary | ICD-10-CM | POA: Diagnosis not present

## 2022-10-03 LAB — COMPREHENSIVE METABOLIC PANEL
ALT: 30 U/L (ref 0–44)
AST: 26 U/L (ref 15–41)
Albumin: 3.1 g/dL — ABNORMAL LOW (ref 3.5–5.0)
Alkaline Phosphatase: 100 U/L (ref 38–126)
Anion gap: 9 (ref 5–15)
BUN: 10 mg/dL (ref 8–23)
CO2: 23 mmol/L (ref 22–32)
Calcium: 8.6 mg/dL — ABNORMAL LOW (ref 8.9–10.3)
Chloride: 110 mmol/L (ref 98–111)
Creatinine, Ser: 0.58 mg/dL — ABNORMAL LOW (ref 0.61–1.24)
GFR, Estimated: >60 mL/min (ref >60)
Glucose, Bld: 102 mg/dL — ABNORMAL HIGH (ref 70–99)
Potassium: 3.4 mmol/L — ABNORMAL LOW (ref 3.5–5.1)
Sodium: 142 mmol/L (ref 135–145)
Total Bilirubin: 1.2 mg/dL (ref 0.3–1.2)
Total Protein: 5.3 g/dL — ABNORMAL LOW (ref 6.5–8.1)

## 2022-10-03 LAB — CBC
HCT: 35.4 % — ABNORMAL LOW (ref 39.0–52.0)
Hemoglobin: 11.5 g/dL — ABNORMAL LOW (ref 13.0–17.0)
MCH: 29.3 pg (ref 26.0–34.0)
MCHC: 32.5 g/dL (ref 30.0–36.0)
MCV: 90.3 fL (ref 80.0–100.0)
Platelets: 134 10*3/uL — ABNORMAL LOW (ref 150–400)
RBC: 3.92 MIL/uL — ABNORMAL LOW (ref 4.22–5.81)
RDW: 18.6 % — ABNORMAL HIGH (ref 11.5–15.5)
WBC: 15.7 10*3/uL — ABNORMAL HIGH (ref 4.0–10.5)
nRBC: 0 % (ref 0.0–0.2)

## 2022-10-03 LAB — URINE CULTURE
Culture: <10000 — AB
Special Requests: NORMAL

## 2022-10-03 MED ORDER — SODIUM CHLORIDE 0.9 % IV SOLN: INTRAVENOUS | Status: DC

## 2022-10-03 MED ORDER — ALUM & MAG HYDROXIDE-SIMETH 200-200-20 MG/5ML PO SUSP: 30.0000 mL | Freq: Four times a day (QID) | ORAL | Status: DC | PRN

## 2022-10-03 MED ORDER — LIDOCAINE VISCOUS HCL 2 % MT SOLN: 15.0000 mL | Freq: Four times a day (QID) | OROMUCOSAL | Status: DC | PRN

## 2022-10-03 MED ORDER — SUCRALFATE 1 GM/10ML PO SUSP
1.0000 g | Freq: Four times a day (QID) | ORAL | Status: DC
  Administered 2022-10-03 – 2022-10-08 (×20): 1 g via ORAL
  Filled 2022-10-03 (×20): qty 10

## 2022-10-03 NOTE — Progress Notes (Signed)
PROGRESS NOTE    Jared [name]  PFX:[id number] DOB: 09-17-1946 DOA: 10/02/2022 PCP: [name], MD    Chief Complaint  Patient presents with   Abdominal Pain    Brief Narrative: Patient is a pleasant 76 year old gentleman history of OA multiple sites, cataracts, nephrolithiasis, prostate cancer, GERD, hyperlipidemia, pancreatic cancer received last chemotherapy session Friday prior to admission along with Neulasta who presented for second day in a row to the ED due to epigastric pain radiating to his chest associated with a burning sensation in the setting of multiple episodes of nausea vomiting diarrhea.  CT abdomen and pelvis done with streaky subsegmental basilar atelectasis, no infiltrates or effusion, no pericardial effusion, stable pancreatic body/tail junction regional mass with associated atrophy of pancreatic tail and dilatation of pancreatic duct.  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  Mild thickening of distal esophagus suggestive of esophagitis.  Patient admitted, placed on bowel rest, PPI, Maalox with viscous lidocaine and pain management.   Assessment & Plan:   Principal Problem:   Chest pain Active Problems:   GERD   Hyperlipidemia   Cancer of pancreas, body (HCC)   Esophagitis   Leukocytosis   Lactic acidosis   Hyperglycemia   Aortic atherosclerosis (HCC)   Bilateral cataracts   Glaucoma   #1 chest pain secondary to esophagitis -Patient noted to have gastritis noted on EGD done few months ago. -Patient presenting with midsternal chest pain described as a burning log.  Patient noted with associated nausea vomiting and diarrhea. -CT abdomen and pelvis with concerns for mild thickening of distal esophagus suggestive of esophagitis. -Patient placed on PPI, received Maalox and lidocaine with some clinical improvement. -Denies any further nausea  vomiting or diarrhea, improvement with chest pain. -No oral thrush noted on exam. -Continue IV PPI every 12 hours. -Add Carafate. -Placed on Maalox and lidocaine as needed every 6 hours for indigestion and heartburn. -Continue IV antiemetics, pain management. -Placed on clear liquid diet. -If no continued clinical improvement may need evaluation by GI for possible upper endoscopy for further evaluation. -Will likely need PPI and Carafate on discharge. -Supportive care.  2.  GERD -IV PPI.  3.  Aortic Atherosclerosis/hyperlipidemia -Currently not on medical therapy at this time. -Hold therapy pending GI symptoms. -Outpatient follow-up with PCP.  4.  History of pancreatic cancer/prostate cancer status post prostatectomy 1990 with chronically elevated PSA. -Being followed by oncology. -Outpatient follow-up with oncology.  5.  Hypokalemia -Replete.  6.  Leukocytosis -Likely secondary to GSF administration. -Patient currently afebrile. -Leukocytosis trending down. -Continue to hold off on antibiotics at this time.  7.  Lactic acidosis -Likely secondary to volume depletion. -IV fluids. -Repeat labs in the AM.  8.  Bilateral cataract/glaucoma -Continue home regimen brimonidine, dorzolamide, timolol drops. -Outpatient follow-up with ophthalmology.   DVT prophylaxis: SCDs Code Status: Full Family Communication: Updated patient.  No family at bedside. Disposition: Home when clinically improved, tolerating oral intake.  Status is: Inpatient The patient will require care spanning > 2 midnights and should be moved to inpatient because: Severity of illness   Consultants:  Oncology: Dr. [name] 10/03/2022  Procedures:  CT abdomen and pelvis 10/02/2022 Chest x-ray 10/02/2022  Antimicrobials:  Anti-infectives (From admission, onward)    Start     Dose/Rate Route Frequency Ordered Stop   10/02/22 0645  ceFEPIme (MAXIPIME) 2 g in sodium chloride 0.9 % 100 mL IVPB  2  g 200 mL/hr over 30 Minutes Intravenous  Once 10/02/22 0638 10/02/22 0736   10/02/22 0645  metroNIDAZOLE (FLAGYL) IVPB 500 mg        500 mg 100 mL/hr over 60 Minutes Intravenous  Once 10/02/22 0638 10/02/22 0936   10/02/22 0645  vancomycin (VANCOCIN) IVPB 1000 mg/200 mL premix  Status:  Discontinued        1,000 mg 200 mL/hr over 60 Minutes Intravenous  Once 10/02/22 0638 10/02/22 0643   10/02/22 0645  vancomycin (VANCOREADY) IVPB 2000 mg/400 mL        2,000 mg 200 mL/hr over 120 Minutes Intravenous STAT 10/02/22 0644 10/02/22 1135         Subjective: Sitting up in bed.  States burning in mid chest area seems to have improved since admission on current medication regimen that was given.  Denies any shortness of breath.  Denies any further nausea or vomiting or diarrhea today.  Describes chest pain as a burning log.  Objective: Vitals:   10/02/22 2109 10/03/22 0045 10/03/22 0637 10/03/22 0939  BP: 1'36/77 99/64 97/69 '$ 117/76  Pulse: (!) 50 85 (!) 59 (!) 56  Resp: '18 16 20 18  '$ Temp: 98.6 F (37 C) 98.2 F (36.8 C) 98.2 F (36.8 C) 98.6 F (37 C)  TempSrc: Oral Oral Oral Oral  SpO2: 100% 94% 100% 100%  Weight:      Height:        Intake/Output Summary (Last 24 hours) at 10/03/2022 1309 Last data filed at 10/03/2022 0325 Gross per 24 hour  Intake 1843.53 ml  Output --  Net 1843.53 ml   Filed Weights   10/02/22 2104  Weight: 98.8 kg    Examination:  General exam: Appears calm and comfortable  Respiratory system: Clear to auscultation. Respiratory effort normal. Cardiovascular system: S1 & S2 heard, RRR. No JVD, murmurs, rubs, gallops or clicks. No pedal edema. Gastrointestinal system: Abdomen is nondistended, soft and nontender. No organomegaly or masses felt. Normal bowel sounds heard. Central nervous system: Alert and oriented. No focal neurological deficits. Extremities: Symmetric 5 x 5 power. Skin: No rashes, lesions or ulcers Psychiatry: Judgement and insight  appear normal. Mood & affect appropriate.     Data Reviewed: I have personally reviewed following labs and imaging studies  CBC: Recent Labs  Lab 10/01/22 1459 10/02/22 0529 10/03/22 0510  WBC 45.2* 36.2* 15.7*  NEUTROABS 40.7* 33.2*  --   HGB 12.4* 13.2 11.5*  HCT 36.7* 39.4 35.4*  MCV 87.6 88.7 90.3  PLT 164 171 134*    Basic Metabolic Panel: Recent Labs  Lab 10/01/22 1459 10/02/22 0529 10/03/22 0510  NA 136 138 142  K 3.6 4.5 3.4*  CL 105 105 110  CO2 '22 25 23  '$ GLUCOSE 135* 162* 102*  BUN 7* 8 10  CREATININE 0.57* 0.64 0.58*  CALCIUM 8.5* 9.2 8.6*    GFR: Estimated Creatinine Clearance: 94.2 mL/min (A) (by C-G formula based on SCr of 0.58 mg/dL (L)).  Liver Function Tests: Recent Labs  Lab 10/01/22 1459 10/02/22 0529 10/03/22 0510  AST 28 33 26  ALT 32 37 30  ALKPHOS 114 131* 100  BILITOT 0.9 0.8 1.2  PROT 5.8* 6.5 5.3*  ALBUMIN 3.2* 3.7 3.1*    CBG: No results for input(s): "GLUCAP" in the last 168 hours.   Recent Results (from the past 240 hour(s))  Resp Panel by RT-PCR (Flu A&B, Covid) Anterior Nasal Swab     Status: None   Collection  Time: 10/02/22  6:38 AM   Specimen: Anterior Nasal Swab  Result Value Ref Range Status   SARS Coronavirus 2 by RT PCR NEGATIVE NEGATIVE Final    Comment: (NOTE) SARS-CoV-2 target nucleic acids are NOT DETECTED.  The SARS-CoV-2 RNA is generally detectable in upper respiratory specimens during the acute phase of infection. The lowest concentration of SARS-CoV-2 viral copies this assay can detect is 138 copies/mL. A negative result does not preclude SARS-Cov-2 infection and should not be used as the sole basis for treatment or other patient management decisions. A negative result may occur with  improper specimen collection/handling, submission of specimen other than nasopharyngeal swab, presence of viral mutation(s) within the areas targeted by this assay, and inadequate number of viral copies(<138  copies/mL). A negative result must be combined with clinical observations, patient history, and epidemiological information. The expected result is Negative.  Fact Sheet for Patients:  EntrepreneurPulse.com.au  Fact Sheet for Healthcare Providers:  IncredibleEmployment.be  This test is no t yet approved or cleared by the [place] FDA and  has been authorized for detection and/or diagnosis of SARS-CoV-2 by FDA under an Emergency Use Authorization (EUA). This EUA will remain  in effect (meaning this test can be used) for the duration of the COVID-19 declaration under Section 564(b)(1) of the Act, 21 U.S.C.section 360bbb-3(b)(1), unless the authorization is terminated  or revoked sooner.       Influenza A by PCR NEGATIVE NEGATIVE Final   Influenza B by PCR NEGATIVE NEGATIVE Final    Comment: (NOTE) The Xpert Xpress SARS-CoV-2/FLU/RSV plus assay is intended as an aid in the diagnosis of influenza from Nasopharyngeal swab specimens and should not be used as a sole basis for treatment. Nasal washings and aspirates are unacceptable for Xpert Xpress SARS-CoV-2/FLU/RSV testing.  Fact Sheet for Patients: EntrepreneurPulse.com.au  Fact Sheet for Healthcare Providers: IncredibleEmployment.be  This test is not yet approved or cleared by the [place] FDA and has been authorized for detection and/or diagnosis of SARS-CoV-2 by FDA under an Emergency Use Authorization (EUA). This EUA will remain in effect (meaning this test can be used) for the duration of the COVID-19 declaration under Section 564(b)(1) of the Act, 21 U.S.C. section 360bbb-3(b)(1), unless the authorization is terminated or revoked.  Performed at [facility], [place] [address], [place], [place] [address]   Blood Culture (routine x 2)     Status: None (Preliminary result)   Collection Time: 10/02/22  6:55 AM   Specimen:  BLOOD  Result Value Ref Range Status   Specimen Description   Final    BLOOD LEFT ANTECUBITAL Performed at [facility] [address], [place], [place] [address]    Special Requests   Final    BOTTLES DRAWN AEROBIC AND ANAEROBIC Blood Culture adequate volume Performed at [facility] [address] [place], [place] [address]    Culture   Final    NO GROWTH < 24 HOURS Performed at [facility] [address] [place], [place] [address]    Report Status PENDING  Incomplete  Blood Culture (routine x 2)     Status: None (Preliminary result)   Collection Time: 10/02/22  6:58 AM   Specimen: BLOOD RIGHT HAND  Result Value Ref Range Status   Specimen Description   Final    BLOOD RIGHT HAND Performed at [facility] [address] [place], [place] [address]    Special Requests   Final  BOTTLES DRAWN AEROBIC ONLY Blood Culture results may not be optimal due to an inadequate volume of blood received in culture bottles Performed at [facility], [place] [address] [place], [place] [address]    Culture   Final    NO GROWTH < 24 HOURS Performed at [facility] [address], [place], [place] [address]    Report Status PENDING  Incomplete  Urine Culture     Status: Abnormal   Collection Time: 10/02/22  8:24 AM   Specimen: In/Out Cath Urine  Result Value Ref Range Status   Specimen Description   Final    IN/OUT CATH URINE Performed at [facility] [address] [place], [place] [address]    Special Requests   Final    Normal Performed at [facility], [place] [address] [place], [place] [address]    Culture (A)  Final    <10,000 COLONIES/mL INSIGNIFICANT GROWTH Performed at [facility] [address] [place], [place] [address]    Report Status 10/03/2022 FINAL  Final         Radiology Studies: CT ABDOMEN PELVIS W CONTRAST  Result  Date: 10/02/2022 CLINICAL DATA:  Epigastric abdominal pain with nausea, vomiting and diarrhea. Recent diagnosis of pancreatic cancer. EXAM: CT ABDOMEN AND PELVIS WITH CONTRAST TECHNIQUE: Multidetector CT imaging of the abdomen and pelvis was performed using the standard protocol following bolus administration of intravenous contrast. RADIATION DOSE REDUCTION: This exam was performed according to the departmental dose-optimization program which includes automated exposure control, adjustment of the mA and/or kV according to patient size and/or use of iterative reconstruction technique. CONTRAST:  171m OMNIPAQUE IOHEXOL 300 MG/ML  SOLN COMPARISON:  CT scan 07/15/2022 and 10/01/2022 FINDINGS: Lower chest: Streaky subsegmental basilar atelectasis but no infiltrates or effusions. The heart is normal in size. No pericardial effusion. Mild thickening of the distal esophagus could suggest esophagitis. Hepatobiliary: No hepatic lesions to suggest metastatic disease. The gallbladder is surgically absent. No intra or extrahepatic biliary dilatation. Pancreas: Stable pancreatic body/tail junction region mass. Associated atrophy of the pancreatic tail and dilatation of the pancreatic duct. The pancreatic head is unremarkable and stable. Spleen: Normal size. No focal lesions. Adrenals/Urinary Tract: Stable 15 mm left adrenal gland lesions unchanged since 2021 and likely benign adenoma. The right adrenal gland is normal. No renal lesions or hydronephrosis. Stable bilateral renal calculi. The bladder is unremarkable. It does contain contrast from yesterday CT scan. Stomach/Bowel: The stomach, duodenum, small bowel and colon are unremarkable. No acute inflammatory changes, mass lesions or obstructive findings. Vascular/Lymphatic: Stable scattered atherosclerotic calcifications involving the aorta and iliac arteries but no aneurysm or dissection. The aortic branch vessels are patent. The major venous structures are patent.  Reproductive: Status post prostatectomy. Other: No pelvic mass or adenopathy. No free pelvic fluid collections. No inguinal mass or adenopathy. No abdominal wall hernia or subcutaneous lesions. Musculoskeletal: No significant bony findings. Stable sclerotic lesion in the sacrum likely benign bone island. IMPRESSION: 1. Stable pancreatic body/tail junction region mass with associated atrophy of the pancreatic tail and dilatation of the pancreatic duct. 2. Stable left adrenal gland lesion, likely benign adenoma. 3. Stable bilateral renal calculi. 4. Status post prostatectomy. No findings for metastatic disease. 5. Status post cholecystectomy. No biliary dilatation. 6. Mild thickening of the distal esophagus could suggest esophagitis. 7. Aortic atherosclerosis. Aortic Atherosclerosis (ICD10-I70.0). Electronically Signed   By: PMarijo SanesM.D.   On: 10/02/2022 07:56   DG Chest  Port 1 View  Result Date: 10/02/2022 CLINICAL DATA:  Chest burning. EXAM: PORTABLE CHEST 1 VIEW COMPARISON:  07/31/2022 FINDINGS: Stable asymmetric elevation left hemidiaphragm. Left Port-A-Cath again noted. The lungs are clear without focal pneumonia, edema, pneumothorax or pleural effusion. Cardiopericardial silhouette is at upper limits of normal for size. Bones are diffusely demineralized. IMPRESSION: Stable exam. No acute cardiopulmonary findings. Electronically Signed   By: Misty Stanley M.D.   On: 10/02/2022 06:20   CT ABDOMEN PELVIS W CONTRAST  Result Date: 10/01/2022 CLINICAL DATA:  Pancreatic cancer diagnosed 07/05/2022 status post chemotherapy. Remote prostatectomy for prostate cancer. Abdominal pain, nausea and vomiting. * Tracking Code: BO * EXAM: CT ABDOMEN AND PELVIS WITH CONTRAST TECHNIQUE: Multidetector CT imaging of the abdomen and pelvis was performed using the standard protocol following bolus administration of intravenous contrast. RADIATION DOSE REDUCTION: This exam was performed according to the departmental  dose-optimization program which includes automated exposure control, adjustment of the mA and/or kV according to patient size and/or use of iterative reconstruction technique. CONTRAST:  183m OMNIPAQUE IOHEXOL 300 MG/ML  SOLN COMPARISON:  06/14/2022 CT abdomen/pelvis. FINDINGS: Lower chest: Basilar right lower lobe 0.4 cm solid pulmonary nodule (series 5/image 26), unchanged. No acute abnormality at the lung bases. Hepatobiliary: Normal liver size. No liver mass. Cholecystectomy. No biliary ductal dilatation. Pancreas: Poorly marginated hypodense 3.5 x 2.6 cm distal pancreatic body mass (series 2/image 25), mildly decreased from 3.9 x 3.3 cm. Persistent pancreatic duct dilation (6 mm diameter) and parenchymal atrophy in the pancreatic tail. Spleen: Normal size. No mass. Adrenals/Urinary Tract: Heterogeneous 2.8 x 2.0 cm left adrenal nodule with density 33 HU, increased from 2.3 x 1.6 cm and newly heterogeneous in density. Normal right adrenal. A few scattered nonobstructing right renal stones, largest 6 mm in the lower right kidney. Nonobstructing 3 mm lower left renal stone. No hydronephrosis. Simple 1.3 cm lower left renal cyst, for which no follow-up imaging is recommended. Normal bladder. Stomach/Bowel: Normal non-distended stomach. Normal caliber small bowel with no small bowel wall thickening. Clustered small 3 mm calcified appendicoliths near the cecal base. Otherwise normal appendix. Mild sigmoid diverticulosis with no large bowel wall thickening or significant pericolonic fat stranding. Vascular/Lymphatic: Atherosclerotic nonaneurysmal abdominal aorta. Patent hepatic, portal and renal veins. Slightly decreased moderate extrinsic narrowing of the proximal splenic vein by the pancreatic neoplasm. No pathologically enlarged lymph nodes in the abdomen or pelvis. Reproductive: Prostatectomy. Other: No pneumoperitoneum, ascites or focal fluid collection. Musculoskeletal: No aggressive appearing focal osseous  lesions. Moderate lumbar spondylosis. IMPRESSION: 1. Poorly marginated hypodense 3.5 cm distal pancreatic body mass, mildly decreased in size since 06/14/2022 CT. Persistent pancreatic duct dilation and parenchymal atrophy in the pancreatic tail. Slightly decreased moderate extrinsic narrowing of the proximal splenic vein by the pancreatic neoplasm. 2. 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 3. No additional potential sites of metastatic disease in the abdomen or pelvis. 4. Stable basilar right lower lobe 0.4 cm solid pulmonary nodule. 5. No evidence of bowel obstruction or acute bowel inflammation. Mild sigmoid diverticulosis. 6. Nonobstructing bilateral nephrolithiasis. 7.  Aortic Atherosclerosis (ICD10-I70.0). Electronically Signed   By: JIlona SorrelM.D.   On: 10/01/2022 15:54        Scheduled Meds:  brimonidine  1 drop Left Eye BID   dorzolamide-timolol  1 drop Left Eye BID   pantoprazole (PROTONIX) IV  40 mg Intravenous  Q12H   potassium chloride SA  20 mEq Oral Daily   sucralfate  1 g Oral Q6H   Continuous Infusions:  sodium chloride 125 mL/hr at 10/03/22 1008     LOS: 0 days    Time spent: 35 minutes    Irine Seal, MD [facility]   To contact the attending provider between 7A-7P or the covering provider during after hours 7P-7A, please log into the web site [email or web address] and access using universal [place] password for that web site. If you do not have the password, please call the hospital operator.  10/03/2022, 1:09 PM

## 2022-10-03 NOTE — Progress Notes (Signed)
IP PROGRESS NOTE  Subjective:   Jared [name] has a history of pancreas cancer.  He is being treated with neoadjuvant FOLFIRINOX.  He completed another cycle of chemotherapy on 09/26/2022.  No neuropathy symptoms.  He reports the onset of nausea/vomiting and diarrhea on 10/02/2022.   He was seen in the emergency room.  He was treated with intravenous fluids, Zofran, and hydromorphone.  He was discharged home. Jared [name] return to the emergency room yesterday with persistent chest pain and vomiting.  He was admitted for further evaluation. He reports improvement in nausea and diarrhea.  A CT abdomen/pelvis 10/02/2022 revealed distal esophageal thickening consistent with esophagitis.  No evidence of progressive pancreas cancer.  Objective: Vital signs in last 24 hours: Blood pressure 132/85, pulse (!) 52, temperature 99.2 F (37.3 C), temperature source Oral, resp. rate 18, height 6' (1.829 m), weight 217 lb 13 oz (98.8 kg), SpO2 100 %.  Intake/Output from previous day: 12/05 0701 - 12/06 0700 In: 1843.5 [I.V.:1843.5] Out: -   Physical Exam:  HEENT: Mild white coat over the tongue, no buccal thrush Lungs: Clear bilaterally Cardiac: Regular rate and rhythm Abdomen: No mass, nontender, no hepatosplenomegaly Extremities: No leg edema   Portacath/PICC-without erythema  Lab Results: Recent Labs    10/02/22 0529 10/03/22 0510  WBC 36.2* 15.7*  HGB 13.2 11.5*  HCT 39.4 35.4*  PLT 171 134*    BMET Recent Labs    10/02/22 0529 10/03/22 0510  NA 138 142  K 4.5 3.4*  CL 105 110  CO2 25 23  GLUCOSE 162* 102*  BUN 8 10  CREATININE 0.64 0.58*  CALCIUM 9.2 8.6*    Lab Results  Component Value Date   [id number] [id number] (H) 09/26/2022    Studies/Results: CT ABDOMEN PELVIS W CONTRAST  Result Date: 10/02/2022 CLINICAL DATA:  Epigastric abdominal pain with nausea, vomiting and diarrhea. Recent diagnosis of pancreatic cancer. EXAM: CT ABDOMEN AND PELVIS WITH CONTRAST TECHNIQUE: Multidetector  CT imaging of the abdomen and pelvis was performed using the standard protocol following bolus administration of intravenous contrast. RADIATION DOSE REDUCTION: This exam was performed according to the departmental dose-optimization program which includes automated exposure control, adjustment of the mA and/or kV according to patient size and/or use of iterative reconstruction technique. CONTRAST:  166m OMNIPAQUE IOHEXOL 300 MG/ML  SOLN COMPARISON:  CT scan 07/15/2022 and 10/01/2022 FINDINGS: Lower chest: Streaky subsegmental basilar atelectasis but no infiltrates or effusions. The heart is normal in size. No pericardial effusion. Mild thickening of the distal esophagus could suggest esophagitis. Hepatobiliary: No hepatic lesions to suggest metastatic disease. The gallbladder is surgically absent. No intra or extrahepatic biliary dilatation. Pancreas: Stable pancreatic body/tail junction region mass. Associated atrophy of the pancreatic tail and dilatation of the pancreatic duct. The pancreatic head is unremarkable and stable. Spleen: Normal size. No focal lesions. Adrenals/Urinary Tract: Stable 15 mm left adrenal gland lesions unchanged since 2021 and likely benign adenoma. The right adrenal gland is normal. No renal lesions or hydronephrosis. Stable bilateral renal calculi. The bladder is unremarkable. It does contain contrast from yesterday CT scan. Stomach/Bowel: The stomach, duodenum, small bowel and colon are unremarkable. No acute inflammatory changes, mass lesions or obstructive findings. Vascular/Lymphatic: Stable scattered atherosclerotic calcifications involving the aorta and iliac arteries but no aneurysm or dissection. The aortic branch vessels are patent. The major venous structures are patent. Reproductive: Status post prostatectomy. Other: No pelvic mass or adenopathy. No free pelvic fluid collections. No inguinal mass or adenopathy. No abdominal wall hernia  or subcutaneous lesions. Musculoskeletal:  No significant bony findings. Stable sclerotic lesion in the sacrum likely benign bone island. IMPRESSION: 1. Stable pancreatic body/tail junction region mass with associated atrophy of the pancreatic tail and dilatation of the pancreatic duct. 2. Stable left adrenal gland lesion, likely benign adenoma. 3. Stable bilateral renal calculi. 4. Status post prostatectomy. No findings for metastatic disease. 5. Status post cholecystectomy. No biliary dilatation. 6. Mild thickening of the distal esophagus could suggest esophagitis. 7. Aortic atherosclerosis. Aortic Atherosclerosis (ICD10-I70.0). Electronically Signed   By: Marijo Sanes M.D.   On: 10/02/2022 07:56   DG Chest Port 1 View  Result Date: 10/02/2022 CLINICAL DATA:  Chest burning. EXAM: PORTABLE CHEST 1 VIEW COMPARISON:  07/31/2022 FINDINGS: Stable asymmetric elevation left hemidiaphragm. Left Port-A-Cath again noted. The lungs are clear without focal pneumonia, edema, pneumothorax or pleural effusion. Cardiopericardial silhouette is at upper limits of normal for size. Bones are diffusely demineralized. IMPRESSION: Stable exam. No acute cardiopulmonary findings. Electronically Signed   By: Misty Stanley M.D.   On: 10/02/2022 06:20    Medications: I have reviewed the patient's current medications.  Assessment/Plan: Pancreas cancer FNA biopsy of a pancreas body/tail mass 07/05/2022-adenocarcinoma CT abdomen/pelvis 06/14/2022-hypoenhancing pancreas body/tail mass with effacement of the splenic vein, no evidence of lymphadenopathy or metastatic disease CA 19-9 on 06/18/2022-767 EUS 07/05/2022-a 7 x 29 mm pancreas body/tail mass, abutment of the splenic artery, no malignant appearing lymph nodes, T2N0 by EUS CTs 07/15/2022-pancreas body/tail mass with no evidence of metastatic disease, splenic vein associated with the tumor, no arterial involvement, stable right greater than left lung nodules favored benign Cycle 1 FOLFOX 08/01/2022 Cycle 2 FOLFOX,  08/16/2022, neulasta Cycle 3 FOLFIRINOX 08/29/2022, Neulasta Cycle 4 FOLFIRINOX 09/12/2022, Neulasta, irinotecan and 5-fluorouracil dose reduced due to diarrhea/weight loss Cycle 5 FOLFIRINOX 09/26/2022 Prostate cancer, status post prostatectomy 1990 Chronic elevation of the PSA-maintained on Trelstar, followed by Dr. Jeffie Pollock   3.  Kidney stones 4.   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  Abdominal pain, likely secondary to #1 6.  Family history of prostate and pancreas cancer 7.  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  Typhlitis in 2014 9.  Colon polyps-tubular adenomas on colonoscopy 03/27/2022 10.  Presentation emergency room 10/01/2022 and 10/02/2022 with nausea/vomiting, diarrhea, and chest pain   Mr. Ickes is now a day following cycle 5 FOLFIRINOX.  He presents with nausea/vomiting, diarrhea, and chest pain.  His symptoms are likely related to toxicity from chemotherapy.  He may have esophagitis.  The elevated white count is likely related to G-CSF therapy.   The abdomen/pelvis CT from yesterday reveals no evidence of progressive pancreas cancer. His case will be presented at the GI tumor conference within the next few weeks to discuss the possibility of surgical resection.  He is not tolerating chemotherapy well, I favor holding further chemotherapy.  Recommendations: Continue intravenous hydration Antiacid therapy Decadron if he has persistent nausea Follow-up as scheduled at the Cancer center His case will be presented at the GI tumor conference to discuss the indication for resection of the pancreas mass   LOS: 0 days   Betsy Coder, MD   10/03/2022, 4:24 PM

## 2022-10-04 DIAGNOSIS — R112 Nausea with vomiting, unspecified: Secondary | ICD-10-CM

## 2022-10-04 DIAGNOSIS — K209 Esophagitis, unspecified without bleeding: Secondary | ICD-10-CM | POA: Diagnosis not present

## 2022-10-04 DIAGNOSIS — H409 Unspecified glaucoma: Secondary | ICD-10-CM | POA: Diagnosis not present

## 2022-10-04 DIAGNOSIS — R079 Chest pain, unspecified: Secondary | ICD-10-CM | POA: Diagnosis not present

## 2022-10-04 DIAGNOSIS — K219 Gastro-esophageal reflux disease without esophagitis: Secondary | ICD-10-CM | POA: Diagnosis not present

## 2022-10-04 LAB — BASIC METABOLIC PANEL
Anion gap: 12 (ref 5–15)
BUN: 8 mg/dL (ref 8–23)
CO2: 22 mmol/L (ref 22–32)
Calcium: 8.2 mg/dL — ABNORMAL LOW (ref 8.9–10.3)
Chloride: 107 mmol/L (ref 98–111)
Creatinine, Ser: 0.66 mg/dL (ref 0.61–1.24)
GFR, Estimated: >60 mL/min (ref >60)
Glucose, Bld: 89 mg/dL (ref 70–99)
Potassium: 3.3 mmol/L — ABNORMAL LOW (ref 3.5–5.1)
Sodium: 141 mmol/L (ref 135–145)

## 2022-10-04 LAB — CBC
HCT: 33.2 % — ABNORMAL LOW (ref 39.0–52.0)
Hemoglobin: 10.8 g/dL — ABNORMAL LOW (ref 13.0–17.0)
MCH: 29.2 pg (ref 26.0–34.0)
MCHC: 32.5 g/dL (ref 30.0–36.0)
MCV: 89.7 fL (ref 80.0–100.0)
Platelets: 134 10*3/uL — ABNORMAL LOW (ref 150–400)
RBC: 3.7 MIL/uL — ABNORMAL LOW (ref 4.22–5.81)
RDW: 18.4 % — ABNORMAL HIGH (ref 11.5–15.5)
WBC: 14.4 10*3/uL — ABNORMAL HIGH (ref 4.0–10.5)
nRBC: 0 % (ref 0.0–0.2)

## 2022-10-04 LAB — MAGNESIUM: Magnesium: 2.1 mg/dL (ref 1.7–2.4)

## 2022-10-04 MED ORDER — PANTOPRAZOLE SODIUM 40 MG PO TBEC
40.0000 mg | DELAYED_RELEASE_TABLET | Freq: Two times a day (BID) | ORAL | Status: DC
  Administered 2022-10-04 – 2022-10-08 (×8): 40 mg via ORAL
  Filled 2022-10-04 (×8): qty 1

## 2022-10-04 MED ORDER — POTASSIUM CHLORIDE CRYS ER 10 MEQ PO TBCR
40.0000 meq | EXTENDED_RELEASE_TABLET | Freq: Once | ORAL | Status: AC
  Administered 2022-10-04: 40 meq via ORAL
  Filled 2022-10-04: qty 4

## 2022-10-04 NOTE — Consult Note (Addendum)
Consultation Note   Referring Provider:  [facility] PCP: [name], MD Primary Gastroenterologist: Silvano Rusk, MD  Reason for consultation: burning in chest, dysphagia  Hospital Day: 3  Assessment    Patient profile:  Jared [name] is a 76 y.o. male with a past medical history significant for   pancreatic cancer undergoing treatment with FOLFIRNOX, glaucoma, kidney stones,  hiatal hernia . See PMH for any additional medical problems.   # 76 yo male undergoing chemotherapy for pancreatic cancer currently admitted with chest pain / mild distal esophageal wall thickening on CT scan. Also having dysphagia which is says has been going on prior to starting chemotherapy. Regarding pain, it could be related to mucositis, severe esophagitis. No oral candida but could still have candida esophagitis, viral etiology less likely since he is already improving without anti-viral treatment.  Regarding dysphagia, this as been present to some degree even prior to starting chemotherapy but chemotherapy has made it worse.  Of note there were no masses / strictures. on endoscopic portion of EUS in September. He could have esophageal dysmotility with recent worsening of symptoms being related to esophagitis.  The chest discomfort has resolved on high dose PPI and carafate but dysphagia  He feels convinced that the dysphagia which he refers to as a "kick back" is related to his hiatal hernia though I explained that the hernia is only 3 cm and not likely the cause.  # Pancreatic surgery,  staged T2 N0 Mx. Being followed by Dr. Barry Dienes outpatient for possible distal pancreatectomy and splenectomy. Currently getting neoadjuvant chemo.    # Leukocytosis, probably secondary to Neulasta  # Pancreatic cancer  # See PMH for additional medical problems  Plan   Continue high dose PPI and carafate for now since it has helped the chest pain Continue full  liquids today. If does okay might advance to soft tomorrow. If dysphagia doesn't improve will consider treating empirically for esophageal candida.  Hopefully things will get better and EGD will not be needed. Marland Kitchen  -----------------------------------------------------------------------------------------------------------------------------------------------------------------  GI ATTENDING:  I have seen and evaluated  the patient in person as well - agree w/ above. Suspect esophagitis from vomiting problems. Continue with plan as above.  Gatha Mayer, MD, [facility] See Shea Evans on call - gastroenterology for best contact person 10/04/2022 5:01 PM   HPI   Jared [name] began having N/V/D a couple of days ago. He was evaluated in ED, given IVF, anti-emetics and discharged home. He returned to ED with persistent vomiting and chest pain. was seen in ED. Chest pain has now resolved with carafate and BID PPI. He has a a "kick back" when eating. He describes this as food not going down esophagus normal. Food tends to hold up. He can generally resume eating after a few minutes. Dysphagia was present before starting chemotherapy but has gotten worse, especially over the last few days.   Significant studies:    WBC 36 Hgb 13.2 Platelets 171 CTAP w contrast - Stable pancreatic body/tail junction region mass with associated atrophy of the pancreatic tail and dilatation of the pancreatic duct. Mild distal esophageal wall thickening  Previous GI Evaluation     Sept 2023 EUS EUS Impression: -  A mass-like area was identified in the pancreatic body/tail region. Cytology results are pending. However, the endosonographic appearance is suspicious for adenocarcinoma. This was staged T2 N0 Mx by endosonographic criteria. The staging applies if malignancy is confirmed. Fine needle biopsy performed. Though I think, updated Pancreas-protocol CT or MRI-Abdomen may help ensure that vasculature notation  is appropriate as noted above. - Two stones were visualized endosonographically in the common bile duct. - There was dilation in the common bile duct and in the common hepatic duct. - No malignant-appearing lymph nodes were visualized in the celiac region (level 20), peripancreatic region and porta hepatis region.  FINAL MICROSCOPIC DIAGNOSIS:   A. PANCREAS, BODY, FINE NEEDLE ASPIRATION:  - Malignant cells consistent with adenocarcinoma    08/23/22 ERCP - 3 cm hiatal hernia. - The major papilla appeared to be bulging. - A filling defect consistent with a stone and sludge was seen on the cholangiogram. - Choledocholithiasis was found. Complete removal was accomplished by biliary sphincterotomy and balloon sweep.Marland Kitchen  Recent Labs and Imaging CT ABDOMEN PELVIS W CONTRAST  Result Date: 10/02/2022 CLINICAL DATA:  Epigastric abdominal pain with nausea, vomiting and diarrhea. Recent diagnosis of pancreatic cancer. EXAM: CT ABDOMEN AND PELVIS WITH CONTRAST TECHNIQUE: Multidetector CT imaging of the abdomen and pelvis was performed using the standard protocol following bolus administration of intravenous contrast. RADIATION DOSE REDUCTION: This exam was performed according to the departmental dose-optimization program which includes automated exposure control, adjustment of the mA and/or kV according to patient size and/or use of iterative reconstruction technique. CONTRAST:  139m OMNIPAQUE IOHEXOL 300 MG/ML  SOLN COMPARISON:  CT scan 07/15/2022 and 10/01/2022 FINDINGS: Lower chest: Streaky subsegmental basilar atelectasis but no infiltrates or effusions. The heart is normal in size. No pericardial effusion. Mild thickening of the distal esophagus could suggest esophagitis. Hepatobiliary: No hepatic lesions to suggest metastatic disease. The gallbladder is surgically absent. No intra or extrahepatic biliary dilatation. Pancreas: Stable pancreatic body/tail junction region mass. Associated atrophy of the  pancreatic tail and dilatation of the pancreatic duct. The pancreatic head is unremarkable and stable. Spleen: Normal size. No focal lesions. Adrenals/Urinary Tract: Stable 15 mm left adrenal gland lesions unchanged since 2021 and likely benign adenoma. The right adrenal gland is normal. No renal lesions or hydronephrosis. Stable bilateral renal calculi. The bladder is unremarkable. It does contain contrast from yesterday CT scan. Stomach/Bowel: The stomach, duodenum, small bowel and colon are unremarkable. No acute inflammatory changes, mass lesions or obstructive findings. Vascular/Lymphatic: Stable scattered atherosclerotic calcifications involving the aorta and iliac arteries but no aneurysm or dissection. The aortic branch vessels are patent. The major venous structures are patent. Reproductive: Status post prostatectomy. Other: No pelvic mass or adenopathy. No free pelvic fluid collections. No inguinal mass or adenopathy. No abdominal wall hernia or subcutaneous lesions. Musculoskeletal: No significant bony findings. Stable sclerotic lesion in the sacrum likely benign bone island. IMPRESSION: 1. Stable pancreatic body/tail junction region mass with associated atrophy of the pancreatic tail and dilatation of the pancreatic duct. 2. Stable left adrenal gland lesion, likely benign adenoma. 3. Stable bilateral renal calculi. 4. Status post prostatectomy. No findings for metastatic disease. 5. Status post cholecystectomy. No biliary dilatation. 6. Mild thickening of the distal esophagus could suggest esophagitis. 7. Aortic atherosclerosis. Aortic Atherosclerosis (ICD10-I70.0). Electronically Signed   By: PMarijo SanesM.D.   On: 10/02/2022 07:56   DG Chest Port 1 View  Result Date: 10/02/2022 CLINICAL DATA:  Chest burning. EXAM: PORTABLE CHEST 1 VIEW COMPARISON:  07/31/2022  FINDINGS: Stable asymmetric elevation left hemidiaphragm. Left Port-A-Cath again noted. The lungs are clear without focal pneumonia, edema,  pneumothorax or pleural effusion. Cardiopericardial silhouette is at upper limits of normal for size. Bones are diffusely demineralized. IMPRESSION: Stable exam. No acute cardiopulmonary findings. Electronically Signed   By: Misty Stanley M.D.   On: 10/02/2022 06:20   CT ABDOMEN PELVIS W CONTRAST  Result Date: 10/01/2022 CLINICAL DATA:  Pancreatic cancer diagnosed 07/05/2022 status post chemotherapy. Remote prostatectomy for prostate cancer. Abdominal pain, nausea and vomiting. * Tracking Code: BO * EXAM: CT ABDOMEN AND PELVIS WITH CONTRAST TECHNIQUE: Multidetector CT imaging of the abdomen and pelvis was performed using the standard protocol following bolus administration of intravenous contrast. RADIATION DOSE REDUCTION: This exam was performed according to the departmental dose-optimization program which includes automated exposure control, adjustment of the mA and/or kV according to patient size and/or use of iterative reconstruction technique. CONTRAST:  120m OMNIPAQUE IOHEXOL 300 MG/ML  SOLN COMPARISON:  06/14/2022 CT abdomen/pelvis. FINDINGS: Lower chest: Basilar right lower lobe 0.4 cm solid pulmonary nodule (series 5/image 26), unchanged. No acute abnormality at the lung bases. Hepatobiliary: Normal liver size. No liver mass. Cholecystectomy. No biliary ductal dilatation. Pancreas: Poorly marginated hypodense 3.5 x 2.6 cm distal pancreatic body mass (series 2/image 25), mildly decreased from 3.9 x 3.3 cm. Persistent pancreatic duct dilation (6 mm diameter) and parenchymal atrophy in the pancreatic tail. Spleen: Normal size. No mass. Adrenals/Urinary Tract: Heterogeneous 2.8 x 2.0 cm left adrenal nodule with density 33 HU, increased from 2.3 x 1.6 cm and newly heterogeneous in density. Normal right adrenal. A few scattered nonobstructing right renal stones, largest 6 mm in the lower right kidney. Nonobstructing 3 mm lower left renal stone. No hydronephrosis. Simple 1.3 cm lower left renal cyst, for  which no follow-up imaging is recommended. Normal bladder. Stomach/Bowel: Normal non-distended stomach. Normal caliber small bowel with no small bowel wall thickening. Clustered small 3 mm calcified appendicoliths near the cecal base. Otherwise normal appendix. Mild sigmoid diverticulosis with no large bowel wall thickening or significant pericolonic fat stranding. Vascular/Lymphatic: Atherosclerotic nonaneurysmal abdominal aorta. Patent hepatic, portal and renal veins. Slightly decreased moderate extrinsic narrowing of the proximal splenic vein by the pancreatic neoplasm. No pathologically enlarged lymph nodes in the abdomen or pelvis. Reproductive: Prostatectomy. Other: No pneumoperitoneum, ascites or focal fluid collection. Musculoskeletal: No aggressive appearing focal osseous lesions. Moderate lumbar spondylosis. IMPRESSION: 1. Poorly marginated hypodense 3.5 cm distal pancreatic body mass, mildly decreased in size since 06/14/2022 CT. Persistent pancreatic duct dilation and parenchymal atrophy in the pancreatic tail. Slightly decreased moderate extrinsic narrowing of the proximal splenic vein by the pancreatic neoplasm. 2. 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 3. No additional potential sites of metastatic disease in the abdomen or pelvis. 4. Stable basilar right lower lobe 0.4 cm solid pulmonary nodule. 5. No evidence of bowel obstruction or acute bowel inflammation. Mild sigmoid diverticulosis. 6. Nonobstructing bilateral nephrolithiasis. 7.  Aortic Atherosclerosis (ICD10-I70.0). Electronically Signed   By: JIlona SorrelM.D.   On: 10/01/2022 15:54    Labs:  Recent Labs    10/02/22 0529 10/03/22 0510 10/04/22 0536  WBC 36.2* 15.7* 14.4*  HGB 13.2 11.5* 10.8*  HCT 39.4 35.4* 33.2*  PLT 171 134* 134*   Recent Labs     10/02/22 0529 10/03/22 0510 10/04/22 0536  NA 138 142 141  K 4.5 3.4* 3.3*  CL 105 110 107  CO2 _0 GLUCOSE 162* 102* 89  BUN _1 CREATININE 0.64 0.58* 0.66  CALCIUM 9.2 8.6* 8.2*   Recent Labs    10/03/22 0510  PROT 5.3*  ALBUMIN 3.1*  AST 26  ALT 30  ALKPHOS 100  BILITOT 1.2    Past Medical History:  Diagnosis Date   Aortic atherosclerosis ([place]) 10/02/2022   Arthritis    SHOULDER & KNEES   Bilateral cataracts 10/02/2022   Cataract    bilateral sx   Choledocholithiasis 07/23/2022   Chronic kidney disease    pt states he does not have kidney disease, just a hx of kidney stones   GERD 02/22/2009   Qualifier: Diagnosis of   By: Carlean Purl MD, Tonna Boehringer E      Glaucoma 10/02/2022   History of kidney stones    Hyperlipidemia 09/06/2021   The 10-year ASCVD risk score (Arnett DK, et al., 2019) is: 12.5%    Values used to calculate the score:      Age: 38 years      Sex: Male      Is Non-Hispanic African American: Yes      Diabetic: No      Tobacco smoker: No      Systolic Blood Pressure: 638 mmHg      Is BP treated: No      HDL Cholesterol: 82 mg/dL      Total Cholesterol: 241 mg/dL      Inguinal hernia    right   Leukocytosis 10/02/2022   Pancreatic cancer ([place]) 2023   Prostate cancer ([place]) 10/29/1988    Past Surgical History:  Procedure Laterality Date   CATARACT EXTRACTION Bilateral    CHOLECYSTECTOMY N/A 07/14/2013   Procedure: LAPAROSCOPIC CHOLECYSTECTOMY;  Surgeon: Stark Klein, MD;  Location: [facility];  Service: General;  Laterality: N/A;   COLONOSCOPY  01/07/2004   Dr. Silvano Rusk   COLONOSCOPY  02/2022   CG-MAC-mira(good)-tics-2 polyps   ENDOSCOPIC RETROGRADE CHOLANGIOPANCREATOGRAPHY (ERCP) WITH PROPOFOL N/A 08/23/2022   Procedure: ENDOSCOPIC RETROGRADE CHOLANGIOPANCREATOGRAPHY (ERCP) WITH PROPOFOL;  Surgeon: Irving Copas., MD;  Location: [facility];  Service: Gastroenterology;  Laterality: N/A;   ESOPHAGOGASTRODUODENOSCOPY (EGD) WITH  PROPOFOL N/A 07/05/2022   Procedure: ESOPHAGOGASTRODUODENOSCOPY (EGD) WITH PROPOFOL;  Surgeon: Rush Landmark Telford Nab., MD;  Location: [facility];  Service: Gastroenterology;  Laterality: N/A;   EUS N/A 07/05/2022   Procedure: UPPER ENDOSCOPIC ULTRASOUND (EUS) RADIAL;  Surgeon: Irving Copas., MD;  Location: [facility];  Service: Gastroenterology;  Laterality: N/A;   EYE SURGERY     FINE NEEDLE ASPIRATION  07/05/2022   Procedure: FINE NEEDLE ASPIRATION (FNA) LINEAR;  Surgeon: Irving Copas., MD;  Location: [facility];  Service: Gastroenterology;;   INGUINAL HERNIA REPAIR Right    PORTACATH PLACEMENT Left 07/31/2022   Procedure: INSERTION PORT-A-CATH;  Surgeon: Stark Klein, MD;  Location: [facility];  Service: General;  Laterality: Left;   PROSTATECTOMY     REMOVAL OF STONES  08/23/2022   Procedure: REMOVAL OF STONES;  Surgeon: Irving Copas., MD;  Location: [facility];  Service: Gastroenterology;;   Joan Mayans  08/23/2022   Procedure: Joan Mayans;  Surgeon: Irving Copas., MD;  Location: [facility];  Service: Gastroenterology;;   TONSILLECTOMY      Family History  Problem Relation Age of Onset   Pancreatic cancer Mother    Kidney cancer Mother    Prostate cancer Father    Prostate cancer  Brother    Prostate cancer Paternal Uncle    Prostate cancer Paternal Uncle    Prostate cancer Paternal Uncle    Prostate cancer Paternal Uncle    Prostate cancer Paternal Grandfather    Colon cancer Neg Hx    Stomach cancer Neg Hx    Esophageal cancer Neg Hx    Colon polyps Neg Hx    Rectal cancer Neg Hx     Prior to Admission medications   Medication Sig Start Date End Date Taking? Authorizing Provider  acetaminophen (TYLENOL) 325 MG tablet Take 325 mg by mouth daily as needed for headache.   Yes [provider]  Ascorbic Acid (VITAMIN C) 500 MG tablet Take 500 mg by mouth daily.     Yes [provider]  brimonidine (ALPHAGAN) 0.15  % ophthalmic solution Place 1 drop into the left eye in the morning and at bedtime.   Yes [provider]  Calcium Carbonate Antacid (TUMS ULTRA 1000 PO) Take 2,000-3,000 mg by mouth See admin instructions. 3000 mg by mouth in the morning and 2000 mg by mouth at night   Yes [provider]  Carboxymethylcell-Glycerin PF (REFRESH OPTIVE PF) 0.5-0.9 % SOLN Place 1 drop into both eyes as needed (dry eyes). 04/28/18  Yes [provider]  cholecalciferol (VITAMIN D3) 25 MCG (1000 UNIT) tablet Take 1,000 Units by mouth daily.   Yes [provider]  Dorzolamide HCl-Timolol Mal PF 2-0.5 % SOLN Place 1 drop into the left eye 2 (two) times daily. 12/01/21  Yes [provider]  fluconazole (DIFLUCAN) 100 MG tablet Take 1 tablet (100 mg total) by mouth daily. Patient taking differently: Take 100 mg by mouth daily as needed (thrush). 09/24/22  Yes Ladell Pier, MD  Garlic 0321 MG CAPS Take 1,000 mg by mouth daily.   Yes [provider]  GEMTESA 75 MG TABS Take 75 mg by mouth daily. 01/31/22  Yes [provider]  HYDROmorphone (DILAUDID) 4 MG tablet Take 1 tablet (4 mg total) by mouth every 6 (six) hours as needed for severe pain. 09/26/22  Yes Ladell Pier, MD  ibuprofen (ADVIL,MOTRIN) 800 MG tablet Take 1 tablet (800 mg total) by mouth every 8 (eight) hours as needed. 09/09/18  Yes Chambliss, Jeb Levering, MD  lidocaine-prilocaine (EMLA) cream Apply 1 Application topically as needed. 07/27/22  Yes Ladell Pier, MD  olopatadine (PATANOL) 0.1 % ophthalmic solution Place 1 drop into both eyes as needed for allergies.   Yes [provider]  ondansetron (ZOFRAN) 8 MG tablet Take 1 tablet (8 mg total) by mouth every 8 (eight) hours as needed for nausea or vomiting (Starting day 3 after chemo as needed for nausea(received Aloxi with chemo)). 09/10/22  Yes Ladell Pier, MD  potassium chloride SA (KLOR-CON M) 20 MEQ tablet Take 1 tablet (20 mEq  total) by mouth daily. 09/26/22  Yes Ladell Pier, MD  prochlorperazine (COMPAZINE) 10 MG tablet Take 1 tablet (10 mg total) by mouth every 6 (six) hours as needed for nausea or vomiting. 07/25/22  Yes Owens Shark, NP  sodium chloride (AYR) 0.65 % nasal spray Place 2-3 sprays into the nose as needed for congestion.   Yes [provider]  Triptorelin Pamoate 11.25 MG SUSR Inject 11.25 mg into the muscle as needed (PSA).   Yes [provider]    Current Facility-Administered Medications  Medication Dose Route Frequency Provider Last Rate Last Admin   0.9 %  sodium  chloride infusion   Intravenous Continuous Eugenie Filler, MD 125 mL/hr at 10/03/22 2144 New Bag at 10/03/22 2144   acetaminophen (TYLENOL) tablet 650 mg  650 mg Oral Q6H PRN Reubin Milan, MD       Or   acetaminophen (TYLENOL) suppository 650 mg  650 mg Rectal Q6H PRN Reubin Milan, MD       alum & mag hydroxide-simeth (MAALOX/MYLANTA) 200-200-20 MG/5ML suspension 30 mL  30 mL Oral Q6H PRN Eugenie Filler, MD       And   lidocaine (XYLOCAINE) 2 % viscous mouth solution 15 mL  15 mL Oral Q6H PRN Eugenie Filler, MD       brimonidine (ALPHAGAN) 0.15 % ophthalmic solution 1 drop  1 drop Left Eye BID Reubin Milan, MD   1 drop at 10/04/22 1041   dorzolamide-timolol (COSOPT) 2-0.5 % ophthalmic solution 1 drop  1 drop Left Eye BID Reubin Milan, MD   1 drop at 10/04/22 1043   HYDROmorphone (DILAUDID) injection 1 mg  1 mg Intravenous Q3H PRN Reubin Milan, MD   1 mg at 10/04/22 0616   olopatadine (PATANOL) 0.1 % ophthalmic solution 1 drop  1 drop Both Eyes PRN Reubin Milan, MD       ondansetron [facility]) tablet 4 mg  4 mg Oral Q6H PRN Reubin Milan, MD       Or   ondansetron [facility]) injection 4 mg  4 mg Intravenous Q6H PRN Reubin Milan, MD   4 mg at 10/04/22 9675   pantoprazole (PROTONIX) EC tablet 40 mg  40 mg Oral BID Karren Cobble, RPH        polyvinyl alcohol (LIQUIFILM TEARS) 1.4 % ophthalmic solution 1 drop  1 drop Both Eyes PRN Reubin Milan, MD       potassium chloride SA (KLOR-CON M) CR tablet 20 mEq  20 mEq Oral Daily Reubin Milan, MD   20 mEq at 10/03/22 9163   prochlorperazine (COMPAZINE) injection 10 mg  10 mg Intravenous Q6H PRN Reubin Milan, MD   10 mg at 10/03/22 1548   sucralfate (CARAFATE) 1 GM/10ML suspension 1 g  1 g Oral Q6H Eugenie Filler, MD   1 g at 10/04/22 0553    Allergies as of 10/02/2022 - Review Complete 10/02/2022  Allergen Reaction Noted   Claritin-d 24 hour [loratadine-pseudoephedrine er] Other (See Comments) 09/09/2018   Milk-related compounds Other (See Comments) 03/16/2013   Lupron [leuprolide] Itching and Other (See Comments) 03/20/2022    Social History   Socioeconomic History   Marital status: Married    Spouse name: Not on file   Number of children: 1   Years of education: Not on file   Highest education level: Not on file  Occupational History   Occupation: retired  Tobacco Use   Smoking status: Former    Packs/day: 0.50    Years: 20.00    Total pack years: 10.00    Types: Cigarettes    Quit date: 10/29/1981    Years since quitting: 40.9   Smokeless tobacco: Never  Vaping Use   Vaping Use: Never used  Substance and Sexual Activity   Alcohol use: No   Drug use: No   Sexual activity: Yes  Other Topics Concern   Not on file  Social History Narrative   Married to [place]. Retired. 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            Social Determinants of Health   Financial Resource Strain: Low Risk  (07/26/2022)   Overall Financial Resource Strain (CARDIA)    Difficulty of Paying Living Expenses: Not hard at all  Food Insecurity: No Food Insecurity (10/02/2022)   Hunger Vital Sign    Worried About Running Out of Food in the Last Year: Never true    Ran Out of Food in the Last Year:  Never true  Transportation Needs: No Transportation Needs (10/02/2022)   PRAPARE - Hydrologist (Medical): No    Lack of Transportation (Non-Medical): No  Physical Activity: Not on file  Stress: Not on file  Social Connections: Socially Integrated (07/26/2022)   Social Connection and Isolation Panel [NHANES]    Frequency of Communication with Friends and Family: More than three times a week    Frequency of Social Gatherings with Friends and Family: More than three times a week    Attends Religious Services: More than 4 times per year    Active Member of [employer] or Organizations: Yes    Attends Music therapist: More than 4 times per year    Marital Status: Married  Human resources officer Violence: Not At Risk (10/02/2022)   Humiliation, Afraid, Rape, and Kick questionnaire    Fear of Current or Ex-Partner: No    Emotionally Abused: No    Physically Abused: No    Sexually Abused: No    Review of Systems: All systems reviewed and negative except where noted in HPI.  Physical Exam: Vital signs in last 24 hours: Temp:  [97.8 F (36.6 C)-99.2 F (37.3 C)] 97.8 F (36.6 C) (12/07 1012) Pulse Rate:  [50-53] 53 (12/07 1012) Resp:  [18-19] 18 (12/07 1012) BP: (101-132)/(67-87) 117/87 (12/07 1012) SpO2:  [99 %-100 %] 100 % (12/07 1012) Last BM Date :  (PTA)  General:  Alert male in NAD Psych:  Pleasant, cooperative. Normal mood and affect Eyes: Pupils equal Ears:  Normal auditory acuity Nose: No deformity, discharge or lesions Mouth: No obvious candida Neck:  Supple, no masses felt Lungs:  Clear to auscultation.  Heart:  Regular rate, regular rhythm.  Abdomen:  Soft, nondistended, nontender, active bowel sounds, no masses felt Rectal :  Deferred Msk: Symmetrical without gross deformities.  Neurologic:  Alert, oriented, grossly normal neurologically Extremities : No edema Skin:  Intact without significant lesions.    Intake/Output from  previous day: 12/06 0701 - 12/07 0700 In: 657.6 [I.V.:657.6] Out: -  Intake/Output this shift:  No intake/output data recorded.    Principal Problem:   Chest pain Active Problems:   GERD   Hyperlipidemia   Cancer of pancreas, body (HCC)   Esophagitis   Leukocytosis   Lactic acidosis   Hyperglycemia   Aortic atherosclerosis ([facility])   Bilateral cataracts   Glaucoma    Tye Savoy, NP-C @  10/04/2022, 11:54 AM

## 2022-10-04 NOTE — Progress Notes (Signed)
PROGRESS NOTE    Jared [name]  DIY:[id number] DOB: Jul 12, 1946 DOA: 10/02/2022 PCP: [name], MD    Chief Complaint  Patient presents with   Abdominal Pain    Brief Narrative: Patient is a pleasant 76 year old gentleman history of OA multiple sites, cataracts, nephrolithiasis, prostate cancer, GERD, hyperlipidemia, pancreatic cancer received last chemotherapy session Friday prior to admission along with Neulasta who presented for second day in a row to the ED due to epigastric pain radiating to his chest associated with a burning sensation in the setting of multiple episodes of nausea vomiting diarrhea.  CT abdomen and pelvis done with streaky subsegmental basilar atelectasis, no infiltrates or effusion, no pericardial effusion, stable pancreatic body/tail junction regional mass with associated atrophy of pancreatic tail and dilatation of pancreatic duct.  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  Mild thickening of distal esophagus suggestive of esophagitis.  Patient admitted, placed on bowel rest, PPI, Maalox with viscous lidocaine and pain management.   Assessment & Plan:   Principal Problem:   Chest pain Active Problems:   GERD   Hyperlipidemia   Cancer of pancreas, body (HCC)   Esophagitis   Leukocytosis   Lactic acidosis   Hyperglycemia   Aortic atherosclerosis (HCC)   Bilateral cataracts   Glaucoma   #1 chest pain secondary to esophagitis -Patient noted to have gastritis noted on EGD done few months ago. -Patient presenting with midsternal chest pain described as a burning log.  Patient noted with associated nausea vomiting and diarrhea on admission which has since improved. -Patient with no oral thrush noted.. -CT abdomen and pelvis with concerns for mild thickening of distal esophagus suggestive of esophagitis. -Continue PPI twice daily, Carafate, GI  cocktail as needed.  -Denies any further nausea vomiting or diarrhea, improvement with chest pain. -Continue IV antiemetics, pain management. -Tolerated clear liquids and diet advanced to full liquid diet.   -Due to immunocompromise state in the setting of pancreatic cancer consult with GI for further evaluation to see whether patient may need repeat EGD to rule out a Candida esophagitis versus a viral esophagitis.   -Supportive care.    2.  GERD -Continue IV PPI.  3.  Aortic Atherosclerosis/hyperlipidemia -Currently not on medical therapy at this time. -Hold therapy pending GI symptoms. -Outpatient follow-up with PCP.  4.  History of pancreatic cancer/prostate cancer status post prostatectomy 1990 with chronically elevated PSA. -Being followed by oncology. -Outpatient follow-up with oncology.  5.  Hypokalemia -Replete.  6.  Leukocytosis -Likely secondary to GSF administration. -Patient currently afebrile. -Leukocytosis trending down.   -Hold off on antibiotics at this time.   7.  Lactic acidosis -Likely secondary to volume depletion. -Improved with hydration.   8.  Bilateral cataract/glaucoma -Continue home regimen brimonidine, dorzolamide, timolol drops. -Outpatient follow-up with ophthalmology.   DVT prophylaxis: SCDs Code Status: Full Family Communication: Updated patient.  Updated wife and daughter at bedside.   Disposition: Home when clinically improved, tolerating oral intake and cleared by GI.Marland Kitchen  Status is: Inpatient The patient will require care spanning > 2 midnights and should be moved to inpatient because: Severity of illness   Consultants:  Oncology: Dr. [name] 10/03/2022 Gastroenterology: 10/04/2022  Procedures:  CT abdomen and pelvis 10/02/2022 Chest x-ray 10/02/2022  Antimicrobials:  Anti-infectives (From admission, onward)    Start     Dose/Rate Route Frequency Ordered Stop   10/02/22 0645  ceFEPIme (MAXIPIME) 2 g in sodium  chloride 0.9 % 100 mL  IVPB        2 g 200 mL/hr over 30 Minutes Intravenous  Once 10/02/22 0638 10/02/22 0736   10/02/22 0645  metroNIDAZOLE (FLAGYL) IVPB 500 mg        500 mg 100 mL/hr over 60 Minutes Intravenous  Once 10/02/22 0638 10/02/22 0936   10/02/22 0645  vancomycin (VANCOCIN) IVPB 1000 mg/200 mL premix  Status:  Discontinued        1,000 mg 200 mL/hr over 60 Minutes Intravenous  Once 10/02/22 0638 10/02/22 0643   10/02/22 0645  vancomycin (VANCOREADY) IVPB 2000 mg/400 mL        2,000 mg 200 mL/hr over 120 Minutes Intravenous STAT 10/02/22 0644 10/02/22 1135         Subjective: Sitting up on the side of the bed.  Wife and daughter at bedside.  States mid chest burning pain improved on current regimen of PPI and Carafate.  Currently seems to be tolerating full liquid diet since this morning.  States sometimes he feels hiatal hernia might be causing the kickback as he describes.  No shortness of breath.  Had an episode of nausea this vomiting which has since resolved with IV antiemetics.  Denies any further emesis.  No diarrhea.  .  Objective: Vitals:   10/03/22 2021 10/04/22 0550 10/04/22 1012 10/04/22 1211  BP: 101/67 126/78 117/87 129/72  Pulse: (!) 50 (!) 53 (!) 53 (!) 54  Resp: '18 19 18 17  '$ Temp: 97.8 F (36.6 C) 98.7 F (37.1 C) 97.8 F (36.6 C) 97.8 F (36.6 C)  TempSrc: Oral Oral  Oral  SpO2: 99% 99% 100% 100%  Weight:      Height:        Intake/Output Summary (Last 24 hours) at 10/04/2022 1339 Last data filed at 10/04/2022 0300 Gross per 24 hour  Intake 657.59 ml  Output --  Net 657.59 ml    Filed Weights   10/02/22 2104  Weight: 98.8 kg    Examination:  General exam: NAD Respiratory system: Lungs clear to auscultation bilaterally.  No wheezes, no crackles, no rhonchi.  Fair air movement.  Speaking in full sentences.   Cardiovascular system: RRR no murmurs rubs or gallops.  No JVD.  No lower extremity edema.  Gastrointestinal system: Abdomen is soft, nontender,  nondistended, positive bowel sounds.  No rebound.  No guarding.  Central nervous system: Alert and oriented. No focal neurological deficits. Extremities: Symmetric 5 x 5 power. Skin: No rashes, lesions or ulcers Psychiatry: Judgement and insight appear normal. Mood & affect appropriate.     Data Reviewed: I have personally reviewed following labs and imaging studies  CBC: Recent Labs  Lab 10/01/22 1459 10/02/22 0529 10/03/22 0510 10/04/22 0536  WBC 45.2* 36.2* 15.7* 14.4*  NEUTROABS 40.7* 33.2*  --   --   HGB 12.4* 13.2 11.5* 10.8*  HCT 36.7* 39.4 35.4* 33.2*  MCV 87.6 88.7 90.3 89.7  PLT 164 171 134* 134*     Basic Metabolic Panel: Recent Labs  Lab 10/01/22 1459 10/02/22 0529 10/03/22 0510 10/04/22 0536  NA 136 138 142 141  K 3.6 4.5 3.4* 3.3*  CL 105 105 110 107  CO2 '22 25 23 22  '$ GLUCOSE 135* 162* 102* 89  BUN 7* '8 10 8  '$ CREATININE 0.57* 0.64 0.58* 0.66  CALCIUM 8.5* 9.2 8.6* 8.2*  MG  --   --   --  2.1     GFR: Estimated Creatinine Clearance: 94.2  mL/min (by C-G formula based on SCr of 0.66 mg/dL).  Liver Function Tests: Recent Labs  Lab 10/01/22 1459 10/02/22 0529 10/03/22 0510  AST 28 33 26  ALT 32 37 30  ALKPHOS 114 131* 100  BILITOT 0.9 0.8 1.2  PROT 5.8* 6.5 5.3*  ALBUMIN 3.2* 3.7 3.1*     CBG: No results for input(s): "GLUCAP" in the last 168 hours.   Recent Results (from the past 240 hour(s))  Resp Panel by RT-PCR (Flu A&B, Covid) Anterior Nasal Swab     Status: None   Collection Time: 10/02/22  6:38 AM   Specimen: Anterior Nasal Swab  Result Value Ref Range Status   SARS Coronavirus 2 by RT PCR NEGATIVE NEGATIVE Final    Comment: (NOTE) SARS-CoV-2 target nucleic acids are NOT DETECTED.  The SARS-CoV-2 RNA is generally detectable in upper respiratory specimens during the acute phase of infection. The lowest concentration of SARS-CoV-2 viral copies this assay can detect is 138 copies/mL. A negative result does not preclude  SARS-Cov-2 infection and should not be used as the sole basis for treatment or other patient management decisions. A negative result may occur with  improper specimen collection/handling, submission of specimen other than nasopharyngeal swab, presence of viral mutation(s) within the areas targeted by this assay, and inadequate number of viral copies(<138 copies/mL). A negative result must be combined with clinical observations, patient history, and epidemiological information. The expected result is Negative.  Fact Sheet for Patients:  EntrepreneurPulse.com.au  Fact Sheet for Healthcare Providers:  IncredibleEmployment.be  This test is no t yet approved or cleared by the [place] FDA and  has been authorized for detection and/or diagnosis of SARS-CoV-2 by FDA under an Emergency Use Authorization (EUA). This EUA will remain  in effect (meaning this test can be used) for the duration of the COVID-19 declaration under Section 564(b)(1) of the Act, 21 U.S.C.section 360bbb-3(b)(1), unless the authorization is terminated  or revoked sooner.       Influenza A by PCR NEGATIVE NEGATIVE Final   Influenza B by PCR NEGATIVE NEGATIVE Final    Comment: (NOTE) The Xpert Xpress SARS-CoV-2/FLU/RSV plus assay is intended as an aid in the diagnosis of influenza from Nasopharyngeal swab specimens and should not be used as a sole basis for treatment. Nasal washings and aspirates are unacceptable for Xpert Xpress SARS-CoV-2/FLU/RSV testing.  Fact Sheet for Patients: EntrepreneurPulse.com.au  Fact Sheet for Healthcare Providers: IncredibleEmployment.be  This test is not yet approved or cleared by the [place] FDA and has been authorized for detection and/or diagnosis of SARS-CoV-2 by FDA under an Emergency Use Authorization (EUA). This EUA will remain in effect (meaning this test can be used) for the duration of  the COVID-19 declaration under Section 564(b)(1) of the Act, 21 U.S.C. section 360bbb-3(b)(1), unless the authorization is terminated or revoked.  Performed at [facility], [place] [address] [place], [place] [address]   Blood Culture (routine x 2)     Status: None (Preliminary result)   Collection Time: 10/02/22  6:55 AM   Specimen: BLOOD  Result Value Ref Range Status   Specimen Description   Final    BLOOD LEFT ANTECUBITAL Performed at [facility] [address] [place], [place] [address]    Special Requests   Final    BOTTLES DRAWN AEROBIC AND ANAEROBIC Blood Culture adequate volume Performed at [facility] [address] [place], [place] [address]    Culture   Final  NO GROWTH 2 DAYS Performed at [facility], [place] [address], [place], [place] [address]    Report Status PENDING  Incomplete  Blood Culture (routine x 2)     Status: None (Preliminary result)   Collection Time: 10/02/22  6:58 AM   Specimen: BLOOD RIGHT HAND  Result Value Ref Range Status   Specimen Description   Final    BLOOD RIGHT HAND Performed at [place] [address], [place], [place] [address]    Special Requests   Final    BOTTLES DRAWN AEROBIC ONLY Blood Culture results may not be optimal due to an inadequate volume of blood received in culture bottles Performed at [facility] [address], [place], [place] [address]    Culture   Final    NO GROWTH 2 DAYS Performed at [facility] [address], [place], [place] [address]    Report Status PENDING  Incomplete  Urine Culture     Status: Abnormal   Collection Time: 10/02/22  8:24 AM   Specimen: In/Out Cath Urine  Result Value Ref Range Status   Specimen Description   Final    IN/OUT CATH URINE Performed at [facility] [address], [place], [place] [address]    Special Requests   Final     Normal Performed at [facility], [place] [address] [place], [place] [address]    Culture (A)  Final    <10,000 COLONIES/mL INSIGNIFICANT GROWTH Performed at [facility] [address], [place], [place] [address]    Report Status 10/03/2022 FINAL  Final         Radiology Studies: No results found.      Scheduled Meds:  brimonidine  1 drop Left Eye BID   dorzolamide-timolol  1 drop Left Eye BID   pantoprazole  40 mg Oral BID   potassium chloride SA  20 mEq Oral Daily   sucralfate  1 g Oral Q6H   Continuous Infusions:  sodium chloride 125 mL/hr at 10/03/22 2144     LOS: 1 day    Time spent: 35 minutes    Irine Seal, MD [facility]   To contact the attending provider between 7A-7P or the covering provider during after hours 7P-7A, please log into the web site [email or web address] and access using universal [place] password for that web site. If you do not have the password, please call the hospital operator.  10/04/2022, 1:39 PM

## 2022-10-05 DIAGNOSIS — K219 Gastro-esophageal reflux disease without esophagitis: Secondary | ICD-10-CM | POA: Diagnosis not present

## 2022-10-05 DIAGNOSIS — K209 Esophagitis, unspecified without bleeding: Secondary | ICD-10-CM | POA: Diagnosis not present

## 2022-10-05 DIAGNOSIS — R079 Chest pain, unspecified: Secondary | ICD-10-CM | POA: Diagnosis not present

## 2022-10-05 DIAGNOSIS — H409 Unspecified glaucoma: Secondary | ICD-10-CM | POA: Diagnosis not present

## 2022-10-05 LAB — CBC WITH DIFFERENTIAL/PLATELET
Abs Immature Granulocytes: 0.41 10*3/uL — ABNORMAL HIGH (ref 0.00–0.07)
Basophils Absolute: 0.1 10*3/uL (ref 0.0–0.1)
Basophils Relative: 1 %
Eosinophils Absolute: 0.1 10*3/uL (ref 0.0–0.5)
Eosinophils Relative: 0 %
HCT: 38.1 % — ABNORMAL LOW (ref 39.0–52.0)
Hemoglobin: 12.8 g/dL — ABNORMAL LOW (ref 13.0–17.0)
Immature Granulocytes: 2 %
Lymphocytes Relative: 6 %
Lymphs Abs: 1.1 10*3/uL (ref 0.7–4.0)
MCH: 29.4 pg (ref 26.0–34.0)
MCHC: 33.6 g/dL (ref 30.0–36.0)
MCV: 87.4 fL (ref 80.0–100.0)
Monocytes Absolute: 1.7 10*3/uL — ABNORMAL HIGH (ref 0.1–1.0)
Monocytes Relative: 10 %
Neutro Abs: 14.4 10*3/uL — ABNORMAL HIGH (ref 1.7–7.7)
Neutrophils Relative %: 81 %
Platelets: 150 10*3/uL (ref 150–400)
RBC: 4.36 MIL/uL (ref 4.22–5.81)
RDW: 18.5 % — ABNORMAL HIGH (ref 11.5–15.5)
WBC: 17.9 10*3/uL — ABNORMAL HIGH (ref 4.0–10.5)
nRBC: 0 % (ref 0.0–0.2)

## 2022-10-05 LAB — BASIC METABOLIC PANEL
Anion gap: 10 (ref 5–15)
BUN: <5 mg/dL — ABNORMAL LOW (ref 8–23)
CO2: 21 mmol/L — ABNORMAL LOW (ref 22–32)
Calcium: 8.5 mg/dL — ABNORMAL LOW (ref 8.9–10.3)
Chloride: 104 mmol/L (ref 98–111)
Creatinine, Ser: 0.63 mg/dL (ref 0.61–1.24)
GFR, Estimated: >60 mL/min (ref >60)
Glucose, Bld: 116 mg/dL — ABNORMAL HIGH (ref 70–99)
Potassium: 3.1 mmol/L — ABNORMAL LOW (ref 3.5–5.1)
Sodium: 135 mmol/L (ref 135–145)

## 2022-10-05 LAB — MAGNESIUM: Magnesium: 2 mg/dL (ref 1.7–2.4)

## 2022-10-05 MED ORDER — POTASSIUM CHLORIDE CRYS ER 10 MEQ PO TBCR
20.0000 meq | EXTENDED_RELEASE_TABLET | Freq: Every day | ORAL | Status: DC
  Administered 2022-10-05 – 2022-10-08 (×4): 20 meq via ORAL
  Filled 2022-10-05 (×4): qty 2

## 2022-10-05 MED ORDER — FLUCONAZOLE 100MG IVPB
100.0000 mg | INTRAVENOUS | Status: DC
  Filled 2022-10-05 (×2): qty 50

## 2022-10-05 MED ORDER — POTASSIUM CHLORIDE 10 MEQ/100ML IV SOLN
10.0000 meq | Freq: Once | INTRAVENOUS | Status: AC
  Administered 2022-10-05: 10 meq via INTRAVENOUS
  Filled 2022-10-05: qty 100

## 2022-10-05 MED ORDER — CHLORHEXIDINE GLUCONATE CLOTH 2 % EX PADS
6.0000 | MEDICATED_PAD | Freq: Every day | CUTANEOUS | Status: DC
  Administered 2022-10-06 – 2022-10-08 (×3): 6 via TOPICAL

## 2022-10-05 MED ORDER — POTASSIUM CHLORIDE 10 MEQ/100ML IV SOLN
10.0000 meq | INTRAVENOUS | Status: AC
  Administered 2022-10-05 (×4): 10 meq via INTRAVENOUS
  Filled 2022-10-05 (×4): qty 100

## 2022-10-05 MED ORDER — SODIUM CHLORIDE 0.9% FLUSH: 10.0000 mL | INTRAVENOUS | Status: DC | PRN

## 2022-10-05 MED ORDER — FLUCONAZOLE IN SODIUM CHLORIDE 200-0.9 MG/100ML-% IV SOLN
200.0000 mg | INTRAVENOUS | Status: DC
  Administered 2022-10-05: 200 mg via INTRAVENOUS
  Filled 2022-10-05: qty 100

## 2022-10-05 MED ORDER — SODIUM CHLORIDE 0.9% FLUSH
10.0000 mL | Freq: Two times a day (BID) | INTRAVENOUS | Status: DC
  Administered 2022-10-06: 10 mL

## 2022-10-05 NOTE — Progress Notes (Addendum)
Daily Progress Note  Hospital Day: 4  Chief Complaint:  chest burning and swallow problems  Assessment   Patient profile:  Jared [name] is a 76 y.o. male with a pmh of pancreatic cancer undergoing treatment with FOLFIRNOX, glaucoma, kidney stones,  hiatal hernia . See PMH for any additional medical problems . Admitted 12/5 with chest burning. Seen by GI 12/7   Plan:    # 76 yo male undergoing chemotherapy for pancreatic cancer   # Chest pain / mild distal esophageal wall thickening on CT scan. Also having dysphagia which he says has been going on prior to starting chemotherapy. Regarding pain, it could be related to mucositis, severe esophagitis. No oral candida but could still have candida esophagitis, viral etiology less likely since he is already improving without anti-viral treatment.  Regarding dysphagia, this has been present to some degree even prior to starting chemotherapy but chemotherapy has made it worse.  Of note there were no masses / strictures. on endoscopic portion of EUS in September. He could have esophageal dysmotility with recent worsening of symptoms being related to esophagitis.  Odynophagia. The burning in chest has resolved but now having "aching" discomfort when he swallows and grits held up in esophagus this am. He does better with liquids / jello.  Still going to keep on full liquids for now.  Continue BID PPI Continue Carafate Treat empirically for candida esophagitis.   # Pancreatic cancer,  stage T2 N0 Mx. Being followed by Dr. Barry Dienes outpatient for possible distal pancreatectomy and splenectomy. Currently getting neoadjuvant chemo.    # Leukocytosis, probably secondary to Neulasta  # Hypokalemia, K+ 3.1   ---------------------------------------------------------------------------------------------------------------------  GI attending:  He is improving.  He did not tolerate grits today but is tolerating soups just fine.  Empiric fluconazole  was added.  I have also seen him and agree with the nurse practitioner note.   Would continue current treatment regimen.  We will follow-up by Monday if he is still here please call us back sooner if needed.  Should he be discharged over the weekend would continue twice daily PPI and 4 times daily Carafate and you can treat him for 2 weeks with fluconazole.   Do not think there is a role for endoscopy at this point.  He has an appointment in heme-onc on December 13.  GI follow-up can be arranged if needed depending upon clinical course.  I do not think it is absolutely necessary after discharge.  Gatha Mayer, MD, [facility] See AMION on call - gastroenterology for best contact person 10/05/2022 6:08 PM    Subjective  Continue BID PPI Continue Carafate Given recurrent esophageal discomfort with eating and ongoing dysphagia will treat empirically for candida with fluconazole.   Objective    Imaging:  CT ABDOMEN PELVIS W CONTRAST  Result Date: 10/02/2022 CLINICAL DATA:  Epigastric abdominal pain with nausea, vomiting and diarrhea. Recent diagnosis of pancreatic cancer. EXAM: CT ABDOMEN AND PELVIS WITH CONTRAST TECHNIQUE: Multidetector CT imaging of the abdomen and pelvis was performed using the standard protocol following bolus administration of intravenous contrast. RADIATION DOSE REDUCTION: This exam was performed according to the departmental dose-optimization program which includes automated exposure control, adjustment of the mA and/or kV according to patient size and/or use of iterative reconstruction technique. CONTRAST:  143m OMNIPAQUE IOHEXOL 300 MG/ML  SOLN COMPARISON:  CT scan 07/15/2022 and 10/01/2022 FINDINGS: Lower chest: Streaky subsegmental basilar atelectasis but no infiltrates or effusions. The heart is  normal in size. No pericardial effusion. Mild thickening of the distal esophagus could suggest esophagitis. Hepatobiliary: No hepatic lesions to  suggest metastatic disease. The gallbladder is surgically absent. No intra or extrahepatic biliary dilatation. Pancreas: Stable pancreatic body/tail junction region mass. Associated atrophy of the pancreatic tail and dilatation of the pancreatic duct. The pancreatic head is unremarkable and stable. Spleen: Normal size. No focal lesions. Adrenals/Urinary Tract: Stable 15 mm left adrenal gland lesions unchanged since 2021 and likely benign adenoma. The right adrenal gland is normal. No renal lesions or hydronephrosis. Stable bilateral renal calculi. The bladder is unremarkable. It does contain contrast from yesterday CT scan. Stomach/Bowel: The stomach, duodenum, small bowel and colon are unremarkable. No acute inflammatory changes, mass lesions or obstructive findings. Vascular/Lymphatic: Stable scattered atherosclerotic calcifications involving the aorta and iliac arteries but no aneurysm or dissection. The aortic branch vessels are patent. The major venous structures are patent. Reproductive: Status post prostatectomy. Other: No pelvic mass or adenopathy. No free pelvic fluid collections. No inguinal mass or adenopathy. No abdominal wall hernia or subcutaneous lesions. Musculoskeletal: No significant bony findings. Stable sclerotic lesion in the sacrum likely benign bone island. IMPRESSION: 1. Stable pancreatic body/tail junction region mass with associated atrophy of the pancreatic tail and dilatation of the pancreatic duct. 2. Stable left adrenal gland lesion, likely benign adenoma. 3. Stable bilateral renal calculi. 4. Status post prostatectomy. No findings for metastatic disease. 5. Status post cholecystectomy. No biliary dilatation. 6. Mild thickening of the distal esophagus could suggest esophagitis. 7. Aortic atherosclerosis. Aortic Atherosclerosis (ICD10-I70.0). Electronically Signed   By: Marijo Sanes M.D.   On: 10/02/2022 07:56   DG Chest Port 1 View  Result Date: 10/02/2022 CLINICAL DATA:  Chest  burning. EXAM: PORTABLE CHEST 1 VIEW COMPARISON:  07/31/2022 FINDINGS: Stable asymmetric elevation left hemidiaphragm. Left Port-A-Cath again noted. The lungs are clear without focal pneumonia, edema, pneumothorax or pleural effusion. Cardiopericardial silhouette is at upper limits of normal for size. Bones are diffusely demineralized. IMPRESSION: Stable exam. No acute cardiopulmonary findings. Electronically Signed   By: Misty Stanley M.D.   On: 10/02/2022 06:20   CT ABDOMEN PELVIS W CONTRAST  Result Date: 10/01/2022 CLINICAL DATA:  Pancreatic cancer diagnosed 07/05/2022 status post chemotherapy. Remote prostatectomy for prostate cancer. Abdominal pain, nausea and vomiting. * Tracking Code: BO * EXAM: CT ABDOMEN AND PELVIS WITH CONTRAST TECHNIQUE: Multidetector CT imaging of the abdomen and pelvis was performed using the standard protocol following bolus administration of intravenous contrast. RADIATION DOSE REDUCTION: This exam was performed according to the departmental dose-optimization program which includes automated exposure control, adjustment of the mA and/or kV according to patient size and/or use of iterative reconstruction technique. CONTRAST:  147m OMNIPAQUE IOHEXOL 300 MG/ML  SOLN COMPARISON:  06/14/2022 CT abdomen/pelvis. FINDINGS: Lower chest: Basilar right lower lobe 0.4 cm solid pulmonary nodule (series 5/image 26), unchanged. No acute abnormality at the lung bases. Hepatobiliary: Normal liver size. No liver mass. Cholecystectomy. No biliary ductal dilatation. Pancreas: Poorly marginated hypodense 3.5 x 2.6 cm distal pancreatic body mass (series 2/image 25), mildly decreased from 3.9 x 3.3 cm. Persistent pancreatic duct dilation (6 mm diameter) and parenchymal atrophy in the pancreatic tail. Spleen: Normal size. No mass. Adrenals/Urinary Tract: Heterogeneous 2.8 x 2.0 cm left adrenal nodule with density 33 HU, increased from 2.3 x 1.6 cm and newly heterogeneous in density. Normal right  adrenal. A few scattered nonobstructing right renal stones, largest 6 mm in the lower right kidney. Nonobstructing 3 mm lower left renal  stone. No hydronephrosis. Simple 1.3 cm lower left renal cyst, for which no follow-up imaging is recommended. Normal bladder. Stomach/Bowel: Normal non-distended stomach. Normal caliber small bowel with no small bowel wall thickening. Clustered small 3 mm calcified appendicoliths near the cecal base. Otherwise normal appendix. Mild sigmoid diverticulosis with no large bowel wall thickening or significant pericolonic fat stranding. Vascular/Lymphatic: Atherosclerotic nonaneurysmal abdominal aorta. Patent hepatic, portal and renal veins. Slightly decreased moderate extrinsic narrowing of the proximal splenic vein by the pancreatic neoplasm. No pathologically enlarged lymph nodes in the abdomen or pelvis. Reproductive: Prostatectomy. Other: No pneumoperitoneum, ascites or focal fluid collection. Musculoskeletal: No aggressive appearing focal osseous lesions. Moderate lumbar spondylosis. IMPRESSION: 1. Poorly marginated hypodense 3.5 cm distal pancreatic body mass, mildly decreased in size since 06/14/2022 CT. Persistent pancreatic duct dilation and parenchymal atrophy in the pancreatic tail. Slightly decreased moderate extrinsic narrowing of the proximal splenic vein by the pancreatic neoplasm. 2. 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 3. No additional potential sites of metastatic disease in the abdomen or pelvis. 4. Stable basilar right lower lobe 0.4 cm solid pulmonary nodule. 5. No evidence of bowel obstruction or acute bowel inflammation. Mild sigmoid diverticulosis. 6. Nonobstructing bilateral nephrolithiasis. 7.  Aortic Atherosclerosis (ICD10-I70.0). Electronically Signed   By: Ilona Sorrel M.D.   On:  10/01/2022 15:54    Lab Results: Recent Labs    10/03/22 0510 10/04/22 0536 10/05/22 0513  WBC 15.7* 14.4* 17.9*  HGB 11.5* 10.8* 12.8*  HCT 35.4* 33.2* 38.1*  PLT 134* 134* 150   BMET Recent Labs    10/03/22 0510 10/04/22 0536 10/05/22 0513  NA 142 141 135  K 3.4* 3.3* 3.1*  CL 110 107 104  CO2 23 22 21*  GLUCOSE 102* 89 116*  BUN 10 8 <5*  CREATININE 0.58* 0.66 0.63  CALCIUM 8.6* 8.2* 8.5*   LFT Recent Labs    10/03/22 0510  PROT 5.3*  ALBUMIN 3.1*  AST 26  ALT 30  ALKPHOS 100  BILITOT 1.2   PT/INR No results for input(s): "LABPROT", "INR" in the last 72 hours.   Scheduled inpatient medications:   brimonidine  1 drop Left Eye BID   dorzolamide-timolol  1 drop Left Eye BID   pantoprazole  40 mg Oral BID   potassium chloride SA  20 mEq Oral Daily   sucralfate  1 g Oral Q6H   Continuous inpatient infusions:   potassium chloride     PRN inpatient medications: acetaminophen **OR** acetaminophen, alum & mag hydroxide-simeth **AND** lidocaine, HYDROmorphone (DILAUDID) injection, olopatadine, ondansetron **OR** ondansetron (ZOFRAN) IV, polyvinyl alcohol, prochlorperazine  Vital signs in last 24 hours: Temp:  [97.8 F (36.6 C)-98.6 F (37 C)] 98.6 F (37 C) (12/08 0525) Pulse Rate:  [50-65] 65 (12/08 0525) Resp:  [17-18] 17 (12/08 0525) BP: (117-140)/(72-87) 133/81 (12/08 0525) SpO2:  [100 %] 100 % (12/08 0525) Last BM Date : 10/04/22  Intake/Output Summary (Last 24 hours) at 10/05/2022 0920 Last data filed at 10/04/2022 1751 Gross per 24 hour  Intake 510 ml  Output --  Net 510 ml    Intake/Output from previous day: 12/07 0701 - 12/08 0700 In: 510 [P.O.:510] Out: -  Intake/Output this shift: No intake/output data recorded.   Physical Exam:  General: Alert male in NAD Heart:  Regular rate and rhythm.  Pulmonary: Normal respiratory effort Abdomen: Soft, nondistended,  nontender. Normal bowel sounds. Extremities: No lower extremity edema   Neurologic: Alert and oriented Psych: Pleasant. Cooperative.    Principal Problem:   Chest pain Active Problems:   GERD   Hyperlipidemia   Cancer of pancreas, body (HCC)   Esophagitis   Leukocytosis   Lactic acidosis   Hyperglycemia   Aortic atherosclerosis (HCC)   Bilateral cataracts   Glaucoma     LOS: 2 days   Tye Savoy ,NP 10/05/2022, 9:20 AM

## 2022-10-05 NOTE — Progress Notes (Signed)
Mobility Specialist - Progress Note   10/05/22 1422  Mobility  Activity Ambulated with assistance in hallway  Level of Assistance Independent after set-up  Assistive Device  (IV Pole)  Distance Ambulated (ft) 500 ft  Activity Response Tolerated well  Mobility Referral Yes  $Mobility charge 1 Mobility   Pt received in recliner and agreeable to mobility. No complaints during mobility. Pt to recliner after session with all needs met & family in room.   [facility]

## 2022-10-05 NOTE — Progress Notes (Signed)
PROGRESS NOTE    Jared [name]  TMH:[id number] DOB: August 26, 1946 DOA: 10/02/2022 PCP: [name], MD    Chief Complaint  Patient presents with   Abdominal Pain    Brief Narrative: Patient is a pleasant 76 year old gentleman history of OA multiple sites, cataracts, nephrolithiasis, prostate cancer, GERD, hyperlipidemia, pancreatic cancer received last chemotherapy session Friday prior to admission along with Neulasta who presented for second day in a row to the ED due to epigastric pain radiating to his chest associated with a burning sensation in the setting of multiple episodes of nausea vomiting diarrhea.  CT abdomen and pelvis done with streaky subsegmental basilar atelectasis, no infiltrates or effusion, no pericardial effusion, stable pancreatic body/tail junction regional mass with associated atrophy of pancreatic tail and dilatation of pancreatic duct.  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  Mild thickening of distal esophagus suggestive of esophagitis.  Patient admitted, placed on bowel rest, PPI, Maalox with viscous lidocaine and pain management.   Assessment & Plan:   Principal Problem:   Chest pain Active Problems:   GERD   Hyperlipidemia   Cancer of pancreas, body (HCC)   Esophagitis   Leukocytosis   Lactic acidosis   Hyperglycemia   Aortic atherosclerosis (HCC)   Bilateral cataracts   Glaucoma   #1 chest pain secondary to esophagitis -Patient noted to have gastritis noted on EGD done few months ago. -Patient presenting with midsternal chest pain described as a burning log.  Patient noted with associated nausea vomiting and diarrhea on admission which has since improved. -Patient with no oral thrush noted.. -CT abdomen and pelvis with concerns for mild thickening of distal esophagus suggestive of esophagitis. -Patient with some improvement with  burning sensation however stated try some grits this morning and had a dull aching pain which she felt got hung up however improved after liquids and Jell-O. -Continue PPI twice daily, Carafate, GI cocktail as needed.  -Denies any further nausea vomiting or diarrhea, improvement with chest pain. -Continue IV antiemetics, pain management. -Continue full liquids.   -Due to immunocompromised state in the setting of pancreatic cancer GI consulted and following, patient has been started empirically on IV fluconazole for treatment of Candida esophagitis empirically per GI. -GI following and appreciate input and recommendations. -Supportive care.    2.  GERD -Continue PPI twice daily.  Continue Carafate.  3.  Aortic Atherosclerosis/hyperlipidemia -Currently not on medical therapy at this time. -Hold therapy pending GI symptoms. -Outpatient follow-up with PCP.  4.  History of pancreatic cancer/prostate cancer status post prostatectomy 1990 with chronically elevated PSA. -Being followed by oncology. -Outpatient follow-up with oncology.  5.  Hypokalemia -Replete.  6.  Leukocytosis -Likely secondary to GSF administration. -Patient currently afebrile. -Leukocytosis fluctuating.   -No need for antibiotics at this time.  7.  Lactic acidosis -Likely secondary to volume depletion. -Improved with hydration.   8.  Bilateral cataract/glaucoma -Continue home regimen brimonidine, dorzolamide, timolol drops. -Outpatient follow-up with ophthalmology.   DVT prophylaxis: SCDs Code Status: Full Family Communication: Updated patient.  Updated wife and daughter at bedside.   Disposition: Home when clinically improved, tolerating oral intake and cleared by GI.Marland Kitchen  Status is: Inpatient The patient will require care spanning > 2 midnights and should be moved to inpatient because: Severity of illness   Consultants:  Oncology: Dr. [name] 10/03/2022 Gastroenterology: Dr. Carlean Purl  10/04/2022  Procedures:  CT abdomen and pelvis 10/02/2022 Chest x-ray 10/02/2022  Antimicrobials:  Anti-infectives (From admission, onward)    Start     Dose/Rate Route Frequency Ordered Stop   10/06/22 1000  fluconazole (DIFLUCAN) IVPB 100 mg        100 mg 50 mL/hr over 60 Minutes Intravenous Every 24 hours 10/05/22 1123     10/05/22 1215  fluconazole (DIFLUCAN) IVPB 200 mg  Status:  Discontinued        200 mg 100 mL/hr over 60 Minutes Intravenous Every 24 hours 10/05/22 1121 10/05/22 1315   10/02/22 0645  ceFEPIme (MAXIPIME) 2 g in sodium chloride 0.9 % 100 mL IVPB        2 g 200 mL/hr over 30 Minutes Intravenous  Once 10/02/22 0638 10/02/22 0736   10/02/22 0645  metroNIDAZOLE (FLAGYL) IVPB 500 mg        500 mg 100 mL/hr over 60 Minutes Intravenous  Once 10/02/22 0638 10/02/22 0936   10/02/22 0645  vancomycin (VANCOCIN) IVPB 1000 mg/200 mL premix  Status:  Discontinued        1,000 mg 200 mL/hr over 60 Minutes Intravenous  Once 10/02/22 0638 10/02/22 0643   10/02/22 0645  vancomycin (VANCOREADY) IVPB 2000 mg/400 mL        2,000 mg 200 mL/hr over 120 Minutes Intravenous STAT 10/02/22 0644 10/02/22 1135         Subjective: Sitting up in chair.  States burning in chest has improved and resolved.  Complaining of aching discomfort when he tried some grits this morning which she felt got held up in his esophagus however did better with Jell-O and liquids.  Denies any shortness of breath.  Overall feeling better than on admission.  States ambulating in hallway.     Objective: Vitals:   10/04/22 1211 10/04/22 2203 10/05/22 0525 10/05/22 1250  BP: 129/72 (!) 140/78 133/81 134/81  Pulse: (!) 54 (!) 50 65 62  Resp: '17 18 17 18  '$ Temp: 97.8 F (36.6 C) 98.1 F (36.7 C) 98.6 F (37 C) 98 F (36.7 C)  TempSrc: Oral  Oral Oral  SpO2: 100% 100% 100%   Weight:      Height:        Intake/Output Summary (Last 24 hours) at 10/05/2022 1331 Last data filed at 10/05/2022 1243 Gross  per 24 hour  Intake 360 ml  Output 500 ml  Net -140 ml    Filed Weights   10/02/22 2104  Weight: 98.8 kg    Examination:  General exam: NAD. Respiratory system: CTAB.  No wheezes, no crackles, no rhonchi.  Fair air movement.  Speaking in full sentences.  Cardiovascular system: Regular rate rhythm no murmurs rubs or gallops.  No JVD.  No lower extremity edema.  Gastrointestinal system: Abdomen soft, nontender, nondistended, positive bowel sounds.  No rebound.  No guarding.  Central nervous system: Alert and oriented. No focal neurological deficits. Extremities: Symmetric 5 x 5 power. Skin: No rashes, lesions or ulcers Psychiatry: Judgement and insight appear normal. Mood & affect appropriate.     Data Reviewed: I have personally reviewed following labs and imaging studies  CBC: Recent Labs  Lab 10/01/22 1459 10/02/22 0529 10/03/22 0510 10/04/22 0536 10/05/22 0513  WBC 45.2* 36.2* 15.7* 14.4* 17.9*  NEUTROABS 40.7* 33.2*  --   --  14.4*  HGB 12.4* 13.2 11.5* 10.8* 12.8*  HCT 36.7* 39.4 35.4* 33.2* 38.1*  MCV 87.6 88.7 90.3 89.7 87.4  PLT 164 171 134* 134* 150     Basic Metabolic Panel: Recent Labs  Lab 10/01/22 1459 10/02/22 0529 10/03/22 0510 10/04/22 0536 10/05/22 0513  NA 136 138 142 141 135  K 3.6 4.5 3.4* 3.3* 3.1*  CL 105 105 110 107 104  CO2 '22 25 23 22 '$ 21*  GLUCOSE 135* 162* 102* 89 116*  BUN 7* '8 10 8 '$ <5*  CREATININE 0.57* 0.64 0.58* 0.66 0.63  CALCIUM 8.5* 9.2 8.6* 8.2* 8.5*  MG  --   --   --  2.1 2.0     GFR: Estimated Creatinine Clearance: 94.2 mL/min (by C-G formula based on SCr of 0.63 mg/dL).  Liver Function Tests: Recent Labs  Lab 10/01/22 1459 10/02/22 0529 10/03/22 0510  AST 28 33 26  ALT 32 37 30  ALKPHOS 114 131* 100  BILITOT 0.9 0.8 1.2  PROT 5.8* 6.5 5.3*  ALBUMIN 3.2* 3.7 3.1*     CBG: No results for input(s): "GLUCAP" in the last 168 hours.   Recent Results (from the past 240 hour(s))  Resp Panel by RT-PCR  (Flu A&B, Covid) Anterior Nasal Swab     Status: None   Collection Time: 10/02/22  6:38 AM   Specimen: Anterior Nasal Swab  Result Value Ref Range Status   SARS Coronavirus 2 by RT PCR NEGATIVE NEGATIVE Final    Comment: (NOTE) SARS-CoV-2 target nucleic acids are NOT DETECTED.  The SARS-CoV-2 RNA is generally detectable in upper respiratory specimens during the acute phase of infection. The lowest concentration of SARS-CoV-2 viral copies this assay can detect is 138 copies/mL. A negative result does not preclude SARS-Cov-2 infection and should not be used as the sole basis for treatment or other patient management decisions. A negative result may occur with  improper specimen collection/handling, submission of specimen other than nasopharyngeal swab, presence of viral mutation(s) within the areas targeted by this assay, and inadequate number of viral copies(<138 copies/mL). A negative result must be combined with clinical observations, patient history, and epidemiological information. The expected result is Negative.  Fact Sheet for Patients:  EntrepreneurPulse.com.au  Fact Sheet for Healthcare Providers:  IncredibleEmployment.be  This test is no t yet approved or cleared by the [place] FDA and  has been authorized for detection and/or diagnosis of SARS-CoV-2 by FDA under an Emergency Use Authorization (EUA). This EUA will remain  in effect (meaning this test can be used) for the duration of the COVID-19 declaration under Section 564(b)(1) of the Act, 21 U.S.C.section 360bbb-3(b)(1), unless the authorization is terminated  or revoked sooner.       Influenza A by PCR NEGATIVE NEGATIVE Final   Influenza B by PCR NEGATIVE NEGATIVE Final    Comment: (NOTE) The Xpert Xpress SARS-CoV-2/FLU/RSV plus assay is intended as an aid in the diagnosis of influenza from Nasopharyngeal swab specimens and should not be used as a sole basis for  treatment. Nasal washings and aspirates are unacceptable for Xpert Xpress SARS-CoV-2/FLU/RSV testing.  Fact Sheet for Patients: EntrepreneurPulse.com.au  Fact Sheet for Healthcare Providers: IncredibleEmployment.be  This test is not yet approved or cleared by the [place] FDA and has been authorized for detection and/or diagnosis of SARS-CoV-2 by FDA under an Emergency Use Authorization (EUA). This EUA will remain in effect (meaning this test can be used) for the duration of the COVID-19 declaration under Section 564(b)(1) of the Act, 21 U.S.C. section 360bbb-3(b)(1), unless the authorization is terminated or revoked.  Performed at [facility], [place] [address] [place], [place] [address]   Blood Culture (routine x 2)  Status: None (Preliminary result)   Collection Time: 10/02/22  6:55 AM   Specimen: BLOOD  Result Value Ref Range Status   Specimen Description   Final    BLOOD LEFT ANTECUBITAL Performed at [place] [address] [place], [place] [address]    Special Requests   Final    BOTTLES DRAWN AEROBIC AND ANAEROBIC Blood Culture adequate volume Performed at [place] [address], [place], [place] [address]    Culture   Final    NO GROWTH 3 DAYS Performed at [facility], [place] [address] [place], [place] [address]    Report Status PENDING  Incomplete  Blood Culture (routine x 2)     Status: None (Preliminary result)   Collection Time: 10/02/22  6:58 AM   Specimen: BLOOD RIGHT HAND  Result Value Ref Range Status   Specimen Description   Final    BLOOD RIGHT HAND Performed at [place] [address], [place], [place] [address]    Special Requests   Final    BOTTLES DRAWN AEROBIC ONLY Blood Culture results may not be optimal due to an inadequate volume of blood received in culture bottles Performed at [place] [address], [place], [place] [address]    Culture   Final    NO GROWTH 3 DAYS Performed at [facility], [place] [address] [place], [place] [address]    Report Status PENDING  Incomplete  Urine Culture     Status: Abnormal   Collection Time: 10/02/22  8:24 AM   Specimen: In/Out Cath Urine  Result Value Ref Range Status   Specimen Description   Final    IN/OUT CATH URINE Performed at [place] [address], [place], [place] [address]    Special Requests   Final    Normal Performed at [facility], [place] [address], [place], [place] [address]    Culture (A)  Final    <10,000 COLONIES/mL INSIGNIFICANT GROWTH Performed at [place] [address], [place], [place] [address]    Report Status 10/03/2022 FINAL  Final         Radiology Studies: No results found.      Scheduled Meds:  brimonidine  1 drop Left Eye BID   dorzolamide-timolol  1 drop Left Eye BID   pantoprazole  40 mg Oral BID   potassium chloride  20 mEq Oral Daily   sucralfate  1 g Oral Q6H   Continuous Infusions:  [START ON 10/06/2022] fluconazole (DIFLUCAN) IV     potassium chloride 10 mEq (10/05/22 1157)     LOS: 2 days    Time spent: 35 minutes    Irine Seal, MD [facility]   To contact the attending provider between 7A-7P or the covering provider during after hours 7P-7A, please log into the web site [email or web address] and access using universal [place] password for that web site. If you do not have the password, please call the hospital operator.  10/05/2022, 1:31 PM

## 2022-10-05 NOTE — Progress Notes (Signed)
Mobility Specialist - Progress Note   10/05/22 0951  Mobility  Activity Ambulated with assistance in hallway  Level of Assistance Independent after set-up  Assistive Device  (IV Pole)  Distance Ambulated (ft) 425 ft  Activity Response Tolerated well  Mobility Referral Yes  $Mobility charge 1 Mobility   Pt received EOB and agreeable to mobility. No complaints during mobility. Pt to recliner after session with all needs met.    [facility]

## 2022-10-06 DIAGNOSIS — K219 Gastro-esophageal reflux disease without esophagitis: Secondary | ICD-10-CM | POA: Diagnosis not present

## 2022-10-06 DIAGNOSIS — R079 Chest pain, unspecified: Secondary | ICD-10-CM | POA: Diagnosis not present

## 2022-10-06 DIAGNOSIS — H409 Unspecified glaucoma: Secondary | ICD-10-CM | POA: Diagnosis not present

## 2022-10-06 DIAGNOSIS — K209 Esophagitis, unspecified without bleeding: Secondary | ICD-10-CM | POA: Diagnosis not present

## 2022-10-06 LAB — CBC
HCT: 32.5 % — ABNORMAL LOW (ref 39.0–52.0)
Hemoglobin: 10.9 g/dL — ABNORMAL LOW (ref 13.0–17.0)
MCH: 29.4 pg (ref 26.0–34.0)
MCHC: 33.5 g/dL (ref 30.0–36.0)
MCV: 87.6 fL (ref 80.0–100.0)
Platelets: 147 10*3/uL — ABNORMAL LOW (ref 150–400)
RBC: 3.71 MIL/uL — ABNORMAL LOW (ref 4.22–5.81)
RDW: 18.9 % — ABNORMAL HIGH (ref 11.5–15.5)
WBC: 10.7 10*3/uL — ABNORMAL HIGH (ref 4.0–10.5)
nRBC: 0 % (ref 0.0–0.2)

## 2022-10-06 LAB — BASIC METABOLIC PANEL
Anion gap: 6 (ref 5–15)
BUN: <5 mg/dL — ABNORMAL LOW (ref 8–23)
CO2: 24 mmol/L (ref 22–32)
Calcium: 8.5 mg/dL — ABNORMAL LOW (ref 8.9–10.3)
Chloride: 109 mmol/L (ref 98–111)
Creatinine, Ser: 0.63 mg/dL (ref 0.61–1.24)
GFR, Estimated: >60 mL/min (ref >60)
Glucose, Bld: 109 mg/dL — ABNORMAL HIGH (ref 70–99)
Potassium: 3.4 mmol/L — ABNORMAL LOW (ref 3.5–5.1)
Sodium: 139 mmol/L (ref 135–145)

## 2022-10-06 LAB — MAGNESIUM: Magnesium: 1.9 mg/dL (ref 1.7–2.4)

## 2022-10-06 MED ORDER — FLUCONAZOLE IN SODIUM CHLORIDE 200-0.9 MG/100ML-% IV SOLN
200.0000 mg | INTRAVENOUS | Status: DC
  Administered 2022-10-06: 200 mg via INTRAVENOUS
  Filled 2022-10-06 (×2): qty 100

## 2022-10-06 MED ORDER — POTASSIUM CHLORIDE 10 MEQ/100ML IV SOLN
10.0000 meq | INTRAVENOUS | Status: AC
  Administered 2022-10-06 (×4): 10 meq via INTRAVENOUS
  Filled 2022-10-06 (×4): qty 100

## 2022-10-06 NOTE — Progress Notes (Signed)
Mobility Specialist - Progress Note   10/06/22 1502  Mobility  Activity Ambulated with assistance in hallway  Level of Assistance Modified independent, requires aide device or extra time  Assistive Device  (IV Pole)  Distance Ambulated (ft) 500 ft  Activity Response Tolerated well  Mobility Referral Yes  $Mobility charge 1 Mobility   Pt received EOB and agreeable to mobility. No complaints during mobility. Pt to EOB after session with all needs met.    [facility]

## 2022-10-06 NOTE — Progress Notes (Signed)
Patient start feeling nausea around 0530, Zofran is given.

## 2022-10-06 NOTE — Progress Notes (Signed)
Mobility Specialist - Progress Note   10/06/22 1136  Mobility  Activity Ambulated with assistance in hallway  Level of Assistance Modified independent, requires aide device or extra time  Assistive Device  (IV Pole)  Distance Ambulated (ft) 400 ft  Activity Response Tolerated well  Mobility Referral Yes  $Mobility charge 1 Mobility   Pt received EOB and agreeable to mobility.  No complaints during mobility. Pt to recliner after session with all needs met & family in room.   [facility]

## 2022-10-06 NOTE — Progress Notes (Signed)
PROGRESS NOTE    Jared [name]  QJF:[id number] DOB: [birth year] DOA: 10/02/2022 PCP: [name], MD    Chief Complaint  Patient presents with   Abdominal Pain    Brief Narrative: Patient is a pleasant 76 year old gentleman history of OA multiple sites, cataracts, nephrolithiasis, prostate cancer, GERD, hyperlipidemia, pancreatic cancer received last chemotherapy session Friday prior to admission along with Neulasta who presented for second day in a row to the ED due to epigastric pain radiating to his chest associated with a burning sensation in the setting of multiple episodes of nausea vomiting diarrhea.  CT abdomen and pelvis done with streaky subsegmental basilar atelectasis, no infiltrates or effusion, no pericardial effusion, stable pancreatic body/tail junction regional mass with associated atrophy of pancreatic tail and dilatation of pancreatic duct.  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  Mild thickening of distal esophagus suggestive of esophagitis.  Patient admitted, placed on bowel rest, PPI, Maalox with viscous lidocaine and pain management.   Assessment & Plan:   Principal Problem:   Chest pain Active Problems:   GERD   Hyperlipidemia   Cancer of pancreas, body (HCC)   Esophagitis   Leukocytosis   Lactic acidosis   Hyperglycemia   Aortic atherosclerosis (HCC)   Bilateral cataracts   Glaucoma   #1 chest pain secondary to esophagitis -Patient noted to have gastritis noted on EGD done few months ago. -Patient presenting with midsternal chest pain described as a burning log.  Patient noted with associated nausea vomiting and diarrhea on admission which has since improved. -Patient with no oral thrush noted.. -CT abdomen and pelvis with concerns for mild thickening of distal esophagus suggestive of esophagitis. -Patient with some improvement with  burning sensation however stated try some grits the morning of 10/05/2022, and had a dull aching pain which she felt got hung up however improved after liquids and Jell-O. -Continue PPI twice daily, Carafate, GI cocktail as needed.  -Denies any further nausea vomiting or diarrhea, improvement with chest pain. -Continue IV antiemetics, pain management. -Tolerating full liquid diet and as such we will advance to a soft diet today.  -Due to immunocompromised state in the setting of pancreatic cancer GI consulted and following, patient has been started empirically on IV fluconazole for treatment of Candida esophagitis empirically per GI. -If patient tolerates soft diet could likely transition to oral fluconazole in the next 24 hours. -GI recommending twice daily PPI and 4 times daily Carafate on discharge in addition to 2 weeks of fluconazole. -GI does not feel any role for endoscopy at this time. -GI following and appreciate input and recommendations. -Supportive care.    2.  GERD -Continue PPI twice daily, Carafate.   3.  Aortic Atherosclerosis/hyperlipidemia -Currently not on medical therapy at this time. -Hold therapy pending GI symptoms. -Outpatient follow-up with PCP.  4.  History of pancreatic cancer/prostate cancer status post prostatectomy 1990 with chronically elevated PSA. -Being followed by oncology. -Outpatient follow-up with oncology.  5.  Hypokalemia -Potassium at 3.4.   -Replete.    6.  Leukocytosis -Likely secondary to GSF administration. -Patient currently afebrile. -Leukocytosis fluctuating but trending back down..   -No need for antibiotics at this time.  7.  Lactic acidosis -Likely secondary to volume depletion. -Improved with hydration.   8.  Bilateral cataract/glaucoma -Continue home regimen brimonidine, dorzolamide, timolol drops. -Outpatient follow-up with ophthalmology.   DVT prophylaxis: SCDs Code Status: Full Family Communication: Updated  patient.   Updated wife and daughter at bedside.   Disposition: Home when clinically improved, tolerating oral intake and cleared by GI.Marland Kitchen  Status is: Inpatient The patient will require care spanning > 2 midnights and should be moved to inpatient because: Severity of illness   Consultants:  Oncology: Dr. [name] 10/03/2022 Gastroenterology: Dr. Carlean Purl 10/04/2022  Procedures:  CT abdomen and pelvis 10/02/2022 Chest x-ray 10/02/2022  Antimicrobials:  Anti-infectives (From admission, onward)    Start     Dose/Rate Route Frequency Ordered Stop   10/06/22 1000  fluconazole (DIFLUCAN) IVPB 100 mg  Status:  Discontinued        100 mg 50 mL/hr over 60 Minutes Intravenous Every 24 hours 10/05/22 1123 10/06/22 0841   10/06/22 1000  fluconazole (DIFLUCAN) IVPB 200 mg        200 mg 100 mL/hr over 60 Minutes Intravenous Every 24 hours 10/06/22 0841     10/05/22 1215  fluconazole (DIFLUCAN) IVPB 200 mg  Status:  Discontinued        200 mg 100 mL/hr over 60 Minutes Intravenous Every 24 hours 10/05/22 1121 10/05/22 1315   10/02/22 0645  ceFEPIme (MAXIPIME) 2 g in sodium chloride 0.9 % 100 mL IVPB        2 g 200 mL/hr over 30 Minutes Intravenous  Once 10/02/22 0638 10/02/22 0736   10/02/22 0645  metroNIDAZOLE (FLAGYL) IVPB 500 mg        500 mg 100 mL/hr over 60 Minutes Intravenous  Once 10/02/22 0638 10/02/22 0936   10/02/22 0645  vancomycin (VANCOCIN) IVPB 1000 mg/200 mL premix  Status:  Discontinued        1,000 mg 200 mL/hr over 60 Minutes Intravenous  Once 10/02/22 0638 10/02/22 0643   10/02/22 0645  vancomycin (VANCOREADY) IVPB 2000 mg/400 mL        2,000 mg 200 mL/hr over 120 Minutes Intravenous STAT 10/02/22 0644 10/02/22 1135         Subjective: Patient sitting up in chair.  Wife and daughter at bedside.  Overall feeling better.  Stated had a bout of nausea and emesis early on this morning however received antiemetics and pain medication with significant clinical improvement.  Tolerated full  liquids.  Awaiting soft diet this afternoon.    Objective: Vitals:   10/05/22 1250 10/05/22 2126 10/06/22 0338 10/06/22 1232  BP: 134/81 123/83 119/85 (!) 131/95  Pulse: 62 (!) 56 (!) 55 (!) 57  Resp: '18 18 18 16  '$ Temp: 98 F (36.7 C) 98.4 F (36.9 C) 98.1 F (36.7 C) 98.3 F (36.8 C)  TempSrc: Oral Oral Oral Oral  SpO2:  100% 99% 100%  Weight:      Height:        Intake/Output Summary (Last 24 hours) at 10/06/2022 1309 Last data filed at 10/05/2022 2109 Gross per 24 hour  Intake 328.43 ml  Output --  Net 328.43 ml    Filed Weights   10/02/22 2104  Weight: 98.8 kg    Examination:  General exam: NAD. Respiratory system: Lungs clear to auscultation bilaterally.  No wheezes, no crackles, no rhonchi.  Fair air movement.  Speaking in full sentences.  Cardiovascular system: RRR no murmurs rubs or gallops.  No JVD.  No lower extremity edema. Gastrointestinal system: Abdomen is soft, nontender, nondistended, positive bowel sounds.  No rebound.  No guarding.  Central nervous system: Alert and oriented. No focal neurological deficits. Extremities: Symmetric 5 x 5 power. Skin: No rashes, lesions or ulcers Psychiatry:  Judgement and insight appear normal. Mood & affect appropriate.     Data Reviewed: I have personally reviewed following labs and imaging studies  CBC: Recent Labs  Lab 10/01/22 1459 10/02/22 0529 10/03/22 0510 10/04/22 0536 10/05/22 0513 10/06/22 0328  WBC 45.2* 36.2* 15.7* 14.4* 17.9* 10.7*  NEUTROABS 40.7* 33.2*  --   --  14.4*  --   HGB 12.4* 13.2 11.5* 10.8* 12.8* 10.9*  HCT 36.7* 39.4 35.4* 33.2* 38.1* 32.5*  MCV 87.6 88.7 90.3 89.7 87.4 87.6  PLT 164 171 134* 134* 150 147*     Basic Metabolic Panel: Recent Labs  Lab 10/02/22 0529 10/03/22 0510 10/04/22 0536 10/05/22 0513 10/06/22 0328  NA 138 142 141 135 139  K 4.5 3.4* 3.3* 3.1* 3.4*  CL 105 110 107 104 109  CO2 '25 23 22 '$ 21* 24  GLUCOSE 162* 102* 89 116* 109*  BUN '8 10 8 '$ <5* <5*   CREATININE 0.64 0.58* 0.66 0.63 0.63  CALCIUM 9.2 8.6* 8.2* 8.5* 8.5*  MG  --   --  2.1 2.0 1.9     GFR: Estimated Creatinine Clearance: 94.2 mL/min (by C-G formula based on SCr of 0.63 mg/dL).  Liver Function Tests: Recent Labs  Lab 10/01/22 1459 10/02/22 0529 10/03/22 0510  AST 28 33 26  ALT 32 37 30  ALKPHOS 114 131* 100  BILITOT 0.9 0.8 1.2  PROT 5.8* 6.5 5.3*  ALBUMIN 3.2* 3.7 3.1*     CBG: No results for input(s): "GLUCAP" in the last 168 hours.   Recent Results (from the past 240 hour(s))  Resp Panel by RT-PCR (Flu A&B, Covid) Anterior Nasal Swab     Status: None   Collection Time: 10/02/22  6:38 AM   Specimen: Anterior Nasal Swab  Result Value Ref Range Status   SARS Coronavirus 2 by RT PCR NEGATIVE NEGATIVE Final    Comment: (NOTE) SARS-CoV-2 target nucleic acids are NOT DETECTED.  The SARS-CoV-2 RNA is generally detectable in upper respiratory specimens during the acute phase of infection. The lowest concentration of SARS-CoV-2 viral copies this assay can detect is 138 copies/mL. A negative result does not preclude SARS-Cov-2 infection and should not be used as the sole basis for treatment or other patient management decisions. A negative result may occur with  improper specimen collection/handling, submission of specimen other than nasopharyngeal swab, presence of viral mutation(s) within the areas targeted by this assay, and inadequate number of viral copies(<138 copies/mL). A negative result must be combined with clinical observations, patient history, and epidemiological information. The expected result is Negative.  Fact Sheet for Patients:  EntrepreneurPulse.com.au  Fact Sheet for Healthcare Providers:  IncredibleEmployment.be  This test is no t yet approved or cleared by the [place] FDA and  has been authorized for detection and/or diagnosis of SARS-CoV-2 by FDA under an Emergency Use Authorization  (EUA). This EUA will remain  in effect (meaning this test can be used) for the duration of the COVID-19 declaration under Section 564(b)(1) of the Act, 21 U.S.C.section 360bbb-3(b)(1), unless the authorization is terminated  or revoked sooner.       Influenza A by PCR NEGATIVE NEGATIVE Final   Influenza B by PCR NEGATIVE NEGATIVE Final    Comment: (NOTE) The Xpert Xpress SARS-CoV-2/FLU/RSV plus assay is intended as an aid in the diagnosis of influenza from Nasopharyngeal swab specimens and should not be used as a sole basis for treatment. Nasal washings and aspirates are unacceptable for Xpert Xpress SARS-CoV-2/FLU/RSV testing.  Fact  Sheet for Patients: EntrepreneurPulse.com.au  Fact Sheet for Healthcare Providers: IncredibleEmployment.be  This test is not yet approved or cleared by the [place] FDA and has been authorized for detection and/or diagnosis of SARS-CoV-2 by FDA under an Emergency Use Authorization (EUA). This EUA will remain in effect (meaning this test can be used) for the duration of the COVID-19 declaration under Section 564(b)(1) of the Act, 21 U.S.C. section 360bbb-3(b)(1), unless the authorization is terminated or revoked.  Performed at [facility], [place] [address] [place], [place] [address]   Blood Culture (routine x 2)     Status: None (Preliminary result)   Collection Time: 10/02/22  6:55 AM   Specimen: BLOOD  Result Value Ref Range Status   Specimen Description   Final    BLOOD LEFT ANTECUBITAL Performed at [place] [address], [place], [place] [address]    Special Requests   Final    BOTTLES DRAWN AEROBIC AND ANAEROBIC Blood Culture adequate volume Performed at [facility] [address], [place], [place] [address]    Culture   Final    NO GROWTH 4 DAYS Performed at [facility], [place] [address], [place], [place] [address]     Report Status PENDING  Incomplete  Blood Culture (routine x 2)     Status: None (Preliminary result)   Collection Time: 10/02/22  6:58 AM   Specimen: BLOOD RIGHT HAND  Result Value Ref Range Status   Specimen Description   Final    BLOOD RIGHT HAND Performed at [facility] [address] [place], [place] [address]    Special Requests   Final    BOTTLES DRAWN AEROBIC ONLY Blood Culture results may not be optimal due to an inadequate volume of blood received in culture bottles Performed at [place] [address] [place], [place] [address]    Culture   Final    NO GROWTH 4 DAYS Performed at [facility], [place] [address], [place], [place] [address]    Report Status PENDING  Incomplete  Urine Culture     Status: Abnormal   Collection Time: 10/02/22  8:24 AM   Specimen: In/Out Cath Urine  Result Value Ref Range Status   Specimen Description   Final    IN/OUT CATH URINE Performed at [facility] [address] [place], [place] [address]    Special Requests   Final    Normal Performed at [facility], [place] [address], [place], [place] [address]    Culture (A)  Final    <10,000 COLONIES/mL INSIGNIFICANT GROWTH Performed at [facility] [facility] [address], [place], [place] [address]    Report Status 10/03/2022 FINAL  Final         Radiology Studies: No results found.      Scheduled Meds:  brimonidine  1 drop Left Eye BID   Chlorhexidine Gluconate Cloth  6 each Topical Daily   dorzolamide-timolol  1 drop Left Eye BID   pantoprazole  40 mg Oral BID   potassium chloride  20 mEq Oral Daily   sodium chloride flush  10-40 mL Intracatheter Q12H   sucralfate  1 g Oral Q6H   Continuous Infusions:  fluconazole (DIFLUCAN) IV 200 mg (10/06/22 1055)     LOS: 3 days    Time spent: 35 minutes    Irine Seal, MD [facility]   To contact the attending  provider between  7A-7P or the covering provider during after hours 7P-7A, please log into the web site [email or web address] and access using universal [place] password for that web site. If you do not have the password, please call the hospital operator.  10/06/2022, 1:09 PM

## 2022-10-07 DIAGNOSIS — K209 Esophagitis, unspecified without bleeding: Secondary | ICD-10-CM | POA: Diagnosis not present

## 2022-10-07 DIAGNOSIS — R079 Chest pain, unspecified: Secondary | ICD-10-CM | POA: Diagnosis not present

## 2022-10-07 DIAGNOSIS — K219 Gastro-esophageal reflux disease without esophagitis: Secondary | ICD-10-CM | POA: Diagnosis not present

## 2022-10-07 DIAGNOSIS — H409 Unspecified glaucoma: Secondary | ICD-10-CM | POA: Diagnosis not present

## 2022-10-07 LAB — BASIC METABOLIC PANEL
Anion gap: 7 (ref 5–15)
BUN: <5 mg/dL — ABNORMAL LOW (ref 8–23)
CO2: 25 mmol/L (ref 22–32)
Calcium: 8.8 mg/dL — ABNORMAL LOW (ref 8.9–10.3)
Chloride: 106 mmol/L (ref 98–111)
Creatinine, Ser: 0.65 mg/dL (ref 0.61–1.24)
GFR, Estimated: >60 mL/min (ref >60)
Glucose, Bld: 125 mg/dL — ABNORMAL HIGH (ref 70–99)
Potassium: 3.6 mmol/L (ref 3.5–5.1)
Sodium: 138 mmol/L (ref 135–145)

## 2022-10-07 LAB — CBC
HCT: 36.5 % — ABNORMAL LOW (ref 39.0–52.0)
Hemoglobin: 12.1 g/dL — ABNORMAL LOW (ref 13.0–17.0)
MCH: 29.3 pg (ref 26.0–34.0)
MCHC: 33.2 g/dL (ref 30.0–36.0)
MCV: 88.4 fL (ref 80.0–100.0)
Platelets: 181 10*3/uL (ref 150–400)
RBC: 4.13 MIL/uL — ABNORMAL LOW (ref 4.22–5.81)
RDW: 19.6 % — ABNORMAL HIGH (ref 11.5–15.5)
WBC: 10.2 10*3/uL (ref 4.0–10.5)
nRBC: 0 % (ref 0.0–0.2)

## 2022-10-07 LAB — CULTURE, BLOOD (ROUTINE X 2)
Culture: NO GROWTH
Culture: NO GROWTH
Special Requests: ADEQUATE

## 2022-10-07 MED ORDER — FLUCONAZOLE 40 MG/ML PO SUSR
200.0000 mg | Freq: Every day | ORAL | Status: DC
  Administered 2022-10-07 – 2022-10-08 (×2): 200 mg via ORAL
  Filled 2022-10-07 (×2): qty 5

## 2022-10-07 MED ORDER — FLUCONAZOLE 200 MG PO TABS
200.0000 mg | ORAL_TABLET | Freq: Every day | ORAL | 0 refills | Status: DC
  Filled 2022-10-07: qty 10, 10d supply, fill #0

## 2022-10-07 MED ORDER — PROCHLORPERAZINE MALEATE 10 MG PO TABS
10.0000 mg | ORAL_TABLET | Freq: Four times a day (QID) | ORAL | 1 refills | Status: DC | PRN
  Filled 2022-10-07: qty 30, 8d supply, fill #0

## 2022-10-07 MED ORDER — FLUCONAZOLE 100 MG PO TABS: 100.0000 mg | ORAL_TABLET | Freq: Every day | ORAL | Status: DC | PRN

## 2022-10-07 MED ORDER — PANTOPRAZOLE SODIUM 40 MG PO TBEC
40.0000 mg | DELAYED_RELEASE_TABLET | Freq: Two times a day (BID) | ORAL | 1 refills | Status: DC
  Filled 2022-10-07: qty 60, 30d supply, fill #0

## 2022-10-07 MED ORDER — SUCRALFATE 1 G PO TABS
1.0000 g | ORAL_TABLET | Freq: Four times a day (QID) | ORAL | 0 refills | Status: DC
  Filled 2022-10-07: qty 120, 30d supply, fill #0

## 2022-10-07 NOTE — Progress Notes (Signed)
Mobility Specialist - Progress Note   10/07/22 1525  Mobility  Activity Ambulated with assistance in hallway  Level of Assistance Modified independent, requires aide device or extra time  Assistive Device  (IV Pole)  Distance Ambulated (ft) 500 ft  Activity Response Tolerated well  Mobility Referral Yes  $Mobility charge 1 Mobility   Pt received in recliner and agreeable to mobility. No complaints during mobility session. Pt to recliner after session with all needs met.    Maya Johnson Mobility Specialist  

## 2022-10-07 NOTE — Progress Notes (Signed)
Pt feel nausea; Zofran given. Pt stated he start feel nausea after took Carafate.

## 2022-10-07 NOTE — Progress Notes (Signed)
PROGRESS NOTE    Jared [name]  MPN:[id number] DOB: 31-Oct-1945 DOA: 10/02/2022 PCP: [name], MD    Chief Complaint  Patient presents with   Abdominal Pain    Brief Narrative: Patient is a pleasant 76 year old gentleman history of OA multiple sites, cataracts, nephrolithiasis, prostate cancer, GERD, hyperlipidemia, pancreatic cancer received last chemotherapy session Friday prior to admission along with Neulasta who presented for second day in a row to the ED due to epigastric pain radiating to his chest associated with a burning sensation in the setting of multiple episodes of nausea vomiting diarrhea.  CT abdomen and pelvis done with streaky subsegmental basilar atelectasis, no infiltrates or effusion, no pericardial effusion, stable pancreatic body/tail junction regional mass with associated atrophy of pancreatic tail and dilatation of pancreatic duct.  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  Mild thickening of distal esophagus suggestive of esophagitis.  Patient admitted, placed on bowel rest, PPI, Maalox with viscous lidocaine and pain management.   Assessment & Plan:   Principal Problem:   Chest pain Active Problems:   GERD   Hyperlipidemia   Cancer of pancreas, body (HCC)   Esophagitis   Leukocytosis   Lactic acidosis   Hyperglycemia   Aortic atherosclerosis (HCC)   Bilateral cataracts   Glaucoma   #1 chest pain secondary to esophagitis -Patient noted to have gastritis noted on EGD done few months ago. -Patient presenting with midsternal chest pain described as a burning log.  Patient noted with associated nausea vomiting and diarrhea on admission which has since improved. -Patient with no oral thrush noted.. -CT abdomen and pelvis with concerns for mild thickening of distal esophagus suggestive of esophagitis. -Patient with some improvement with  burning sensation however stated try some grits the morning of 10/05/2022, and had a dull aching pain which she felt got hung up however improved after liquids and Jell-O. -Continue PPI twice daily, Carafate, GI cocktail as needed.  -Denies any further nausea vomiting or diarrhea, improvement with chest pain. -Continue IV antiemetics, pain management. -Patient advanced to a soft diet which he seems to be tolerating.  -Due to immunocompromised state in the setting of pancreatic cancer GI consulted and following, patient has been started empirically on IV fluconazole for treatment of Candida esophagitis empirically per GI. -Will transition from IV fluconazole to oral fluconazole.  -GI recommending twice daily PPI and 4 times daily Carafate on discharge in addition to 2 weeks of fluconazole. -GI does not feel any role for endoscopy at this time. -GI following and appreciate input and recommendations. -Supportive care.    2.  GERD -PPI twice daily, Carafate.   3.  Aortic Atherosclerosis/hyperlipidemia -Currently not on medical therapy at this time. -Hold therapy pending GI symptoms. -Outpatient follow-up with PCP.  4.  History of pancreatic cancer/prostate cancer status post prostatectomy 1990 with chronically elevated PSA. -Being followed by oncology. -Outpatient follow-up with oncology.  5.  Hypokalemia -Potassium at 3.6.   -Repeat labs in the AM.   6.  Leukocytosis -Likely secondary to GSF administration. -Patient currently afebrile. -Leukocytosis fluctuating but trending back down..   -No need for antibiotics at this time.  7.  Lactic acidosis -Likely secondary to volume depletion. -Improved with hydration.   -No other workup needed.    8.  Bilateral cataract/glaucoma -Continue home regimen brimonidine, dorzolamide, timolol drops. -Outpatient follow-up with ophthalmology.   DVT prophylaxis: SCDs Code Status: Full Family Communication: Updated patient.  No family at  bedside.  Disposition: Home when clinically improved, tolerating oral intake and cleared by GI hopefully tomorrow...  Status is: Inpatient The patient will require care spanning > 2 midnights and should be moved to inpatient because: Severity of illness   Consultants:  Oncology: Dr. [name] 10/03/2022 Gastroenterology: Dr. Carlean Purl 10/04/2022  Procedures:  CT abdomen and pelvis 10/02/2022 Chest x-ray 10/02/2022  Antimicrobials:  Anti-infectives (From admission, onward)    Start     Dose/Rate Route Frequency Ordered Stop   10/07/22 1000  fluconazole (DIFLUCAN) 40 MG/ML suspension 200 mg        200 mg Oral Daily 10/07/22 0816 10/19/22 0959   10/06/22 1000  fluconazole (DIFLUCAN) IVPB 100 mg  Status:  Discontinued        100 mg 50 mL/hr over 60 Minutes Intravenous Every 24 hours 10/05/22 1123 10/06/22 0841   10/06/22 1000  fluconazole (DIFLUCAN) IVPB 200 mg  Status:  Discontinued        200 mg 100 mL/hr over 60 Minutes Intravenous Every 24 hours 10/06/22 0841 10/07/22 0816   10/05/22 1215  fluconazole (DIFLUCAN) IVPB 200 mg  Status:  Discontinued        200 mg 100 mL/hr over 60 Minutes Intravenous Every 24 hours 10/05/22 1121 10/05/22 1315   10/02/22 0645  ceFEPIme (MAXIPIME) 2 g in sodium chloride 0.9 % 100 mL IVPB        2 g 200 mL/hr over 30 Minutes Intravenous  Once 10/02/22 0638 10/02/22 0736   10/02/22 0645  metroNIDAZOLE (FLAGYL) IVPB 500 mg        500 mg 100 mL/hr over 60 Minutes Intravenous  Once 10/02/22 0638 10/02/22 0936   10/02/22 0645  vancomycin (VANCOCIN) IVPB 1000 mg/200 mL premix  Status:  Discontinued        1,000 mg 200 mL/hr over 60 Minutes Intravenous  Once 10/02/22 0638 10/02/22 0643   10/02/22 0645  vancomycin (VANCOREADY) IVPB 2000 mg/400 mL        2,000 mg 200 mL/hr over 120 Minutes Intravenous STAT 10/02/22 0644 10/02/22 1135         Subjective: Sitting up in chair.  Overall feeling better.  Had a bout of nausea this morning which has since resolved.   Started on a soft diet which she seems to be slowly tolerating.  Denies any significant chest pain.    Objective: Vitals:   10/06/22 2047 10/07/22 0428 10/07/22 0430 10/07/22 0957  BP: 1'22/78 96/65 98/66 '$ (!) 127/93  Pulse: (!) 50 (!) 59 (!) 57 62  Resp: '18 18  18  '$ Temp: 98.1 F (36.7 C) 97.8 F (36.6 C)  98.6 F (37 C)  TempSrc: Oral Oral  Oral  SpO2: 100% 99% 99% 100%  Weight:      Height:        Intake/Output Summary (Last 24 hours) at 10/07/2022 1228 Last data filed at 10/07/2022 1000 Gross per 24 hour  Intake 1292.49 ml  Output 475 ml  Net 817.49 ml    Filed Weights   10/02/22 2104  Weight: 98.8 kg    Examination:  General exam: NAD. Respiratory system: CTAB.  No wheezes, no crackles, no rhonchi.  Fair air movement.  Speaking in full sentences.  Cardiovascular system: Regular rate rhythm no murmurs rubs or gallops.  No JVD.  No lower extremity edema.  Gastrointestinal system: Abdomen is soft, nontender, nondistended, positive bowel sounds.  No rebound.  No guarding.  Central nervous system: Alert and oriented. No  focal neurological deficits. Extremities: Symmetric 5 x 5 power. Skin: No rashes, lesions or ulcers Psychiatry: Judgement and insight appear normal. Mood & affect appropriate.     Data Reviewed: I have personally reviewed following labs and imaging studies  CBC: Recent Labs  Lab 10/01/22 1459 10/02/22 0529 10/03/22 0510 10/04/22 0536 10/05/22 0513 10/06/22 0328 10/07/22 1012  WBC 45.2* 36.2* 15.7* 14.4* 17.9* 10.7* 10.2  NEUTROABS 40.7* 33.2*  --   --  14.4*  --   --   HGB 12.4* 13.2 11.5* 10.8* 12.8* 10.9* 12.1*  HCT 36.7* 39.4 35.4* 33.2* 38.1* 32.5* 36.5*  MCV 87.6 88.7 90.3 89.7 87.4 87.6 88.4  PLT 164 171 134* 134* 150 147* 181     Basic Metabolic Panel: Recent Labs  Lab 10/03/22 0510 10/04/22 0536 10/05/22 0513 10/06/22 0328 10/07/22 1012  NA 142 141 135 139 138  K 3.4* 3.3* 3.1* 3.4* 3.6  CL 110 107 104 109 106  CO2 23  22 21* 24 25  GLUCOSE 102* 89 116* 109* 125*  BUN 10 8 <5* <5* <5*  CREATININE 0.58* 0.66 0.63 0.63 0.65  CALCIUM 8.6* 8.2* 8.5* 8.5* 8.8*  MG  --  2.1 2.0 1.9  --      GFR: Estimated Creatinine Clearance: 94.2 mL/min (by C-G formula based on SCr of 0.65 mg/dL).  Liver Function Tests: Recent Labs  Lab 10/01/22 1459 10/02/22 0529 10/03/22 0510  AST 28 33 26  ALT 32 37 30  ALKPHOS 114 131* 100  BILITOT 0.9 0.8 1.2  PROT 5.8* 6.5 5.3*  ALBUMIN 3.2* 3.7 3.1*     CBG: No results for input(s): "GLUCAP" in the last 168 hours.   Recent Results (from the past 240 hour(s))  Resp Panel by RT-PCR (Flu A&B, Covid) Anterior Nasal Swab     Status: None   Collection Time: 10/02/22  6:38 AM   Specimen: Anterior Nasal Swab  Result Value Ref Range Status   SARS Coronavirus 2 by RT PCR NEGATIVE NEGATIVE Final    Comment: (NOTE) SARS-CoV-2 target nucleic acids are NOT DETECTED.  The SARS-CoV-2 RNA is generally detectable in upper respiratory specimens during the acute phase of infection. The lowest concentration of SARS-CoV-2 viral copies this assay can detect is 138 copies/mL. A negative result does not preclude SARS-Cov-2 infection and should not be used as the sole basis for treatment or other patient management decisions. A negative result may occur with  improper specimen collection/handling, submission of specimen other than nasopharyngeal swab, presence of viral mutation(s) within the areas targeted by this assay, and inadequate number of viral copies(<138 copies/mL). A negative result must be combined with clinical observations, patient history, and epidemiological information. The expected result is Negative.  Fact Sheet for Patients:  EntrepreneurPulse.com.au  Fact Sheet for Healthcare Providers:  IncredibleEmployment.be  This test is no t yet approved or cleared by the [place] FDA and  has been authorized for detection and/or  diagnosis of SARS-CoV-2 by FDA under an Emergency Use Authorization (EUA). This EUA will remain  in effect (meaning this test can be used) for the duration of the COVID-19 declaration under Section 564(b)(1) of the Act, 21 U.S.C.section 360bbb-3(b)(1), unless the authorization is terminated  or revoked sooner.       Influenza A by PCR NEGATIVE NEGATIVE Final   Influenza B by PCR NEGATIVE NEGATIVE Final    Comment: (NOTE) The Xpert Xpress SARS-CoV-2/FLU/RSV plus assay is intended as an aid in the diagnosis of influenza from Nasopharyngeal  swab specimens and should not be used as a sole basis for treatment. Nasal washings and aspirates are unacceptable for Xpert Xpress SARS-CoV-2/FLU/RSV testing.  Fact Sheet for Patients: EntrepreneurPulse.com.au  Fact Sheet for Healthcare Providers: IncredibleEmployment.be  This test is not yet approved or cleared by the [place] FDA and has been authorized for detection and/or diagnosis of SARS-CoV-2 by FDA under an Emergency Use Authorization (EUA). This EUA will remain in effect (meaning this test can be used) for the duration of the COVID-19 declaration under Section 564(b)(1) of the Act, 21 U.S.C. section 360bbb-3(b)(1), unless the authorization is terminated or revoked.  Performed at [facility], [place] [address], [place], [place] [address]   Blood Culture (routine x 2)     Status: None   Collection Time: 10/02/22  6:55 AM   Specimen: BLOOD  Result Value Ref Range Status   Specimen Description   Final    BLOOD LEFT ANTECUBITAL Performed at [facility] [address], [place], [place] [address]    Special Requests   Final    BOTTLES DRAWN AEROBIC AND ANAEROBIC Blood Culture adequate volume Performed at [facility] [address], [place], [place] [address]    Culture   Final    NO GROWTH 5 DAYS Performed at [facility],  [place] [address], [place], [place] [address]    Report Status 10/07/2022 FINAL  Final  Blood Culture (routine x 2)     Status: None   Collection Time: 10/02/22  6:58 AM   Specimen: BLOOD RIGHT HAND  Result Value Ref Range Status   Specimen Description   Final    BLOOD RIGHT HAND Performed at [facility] [address] [place], [place] [address]    Special Requests   Final    BOTTLES DRAWN AEROBIC ONLY Blood Culture results may not be optimal due to an inadequate volume of blood received in culture bottles Performed at [facility] [address], [place], [place] [address]    Culture   Final    NO GROWTH 5 DAYS Performed at [facility], [place] [address] [place], [place] [address]    Report Status 10/07/2022 FINAL  Final  Urine Culture     Status: Abnormal   Collection Time: 10/02/22  8:24 AM   Specimen: In/Out Cath Urine  Result Value Ref Range Status   Specimen Description   Final    IN/OUT CATH URINE Performed at [facility] [address], [place], [place] [address]    Special Requests   Final    Normal Performed at [facility], [place] [address] [place], [place] [address]    Culture (A)  Final    <10,000 COLONIES/mL INSIGNIFICANT GROWTH Performed at [facility] [address] [place], [place] [address]    Report Status 10/03/2022 FINAL  Final         Radiology Studies: No results found.      Scheduled Meds:  brimonidine  1 drop Left Eye BID   Chlorhexidine Gluconate Cloth  6 each Topical Daily   dorzolamide-timolol  1 drop Left Eye BID   fluconazole  200 mg Oral Daily   pantoprazole  40 mg Oral BID   potassium chloride  20 mEq Oral Daily   sodium chloride flush  10-40 mL Intracatheter Q12H   sucralfate  1 g Oral Q6H   Continuous Infusions:  LOS: 4 days    Time spent: 35 minutes    Irine Seal, MD [facility]   To contact the attending  provider between 7A-7P or the covering provider during after hours 7P-7A, please log into the web site [email or web address] and access using universal [place] password for that web site. If you do not have the password, please call the hospital operator.  10/07/2022, 12:28 PM

## 2022-10-08 ENCOUNTER — Inpatient Hospital Stay: Payer: Medicare HMO

## 2022-10-08 ENCOUNTER — Other Ambulatory Visit (HOSPITAL_COMMUNITY): Payer: Self-pay

## 2022-10-08 ENCOUNTER — Encounter: Payer: Self-pay | Admitting: Oncology

## 2022-10-08 DIAGNOSIS — H269 Unspecified cataract: Secondary | ICD-10-CM

## 2022-10-08 LAB — BASIC METABOLIC PANEL
Anion gap: 6 (ref 5–15)
BUN: 5 mg/dL — ABNORMAL LOW (ref 8–23)
CO2: 26 mmol/L (ref 22–32)
Calcium: 8.7 mg/dL — ABNORMAL LOW (ref 8.9–10.3)
Chloride: 108 mmol/L (ref 98–111)
Creatinine, Ser: 0.71 mg/dL (ref 0.61–1.24)
GFR, Estimated: >60 mL/min (ref >60)
Glucose, Bld: 107 mg/dL — ABNORMAL HIGH (ref 70–99)
Potassium: 3.5 mmol/L (ref 3.5–5.1)
Sodium: 140 mmol/L (ref 135–145)

## 2022-10-08 MED ORDER — POTASSIUM CHLORIDE CRYS ER 10 MEQ PO TBCR
40.0000 meq | EXTENDED_RELEASE_TABLET | Freq: Once | ORAL | Status: AC
  Administered 2022-10-08: 40 meq via ORAL
  Filled 2022-10-08: qty 4

## 2022-10-08 MED ORDER — HEPARIN SOD (PORK) LOCK FLUSH 100 UNIT/ML IV SOLN
500.0000 [IU] | INTRAVENOUS | Status: AC | PRN
  Administered 2022-10-08: 500 [IU]
  Filled 2022-10-08: qty 5

## 2022-10-08 NOTE — Progress Notes (Signed)
Patient ID: Jared [name], male   DOB: 09/06/1946, 76 y.o.   MRN: [id number]  Brief GI note:  Patient had been seen last week with dysphagia/odynophagia in setting of chemotherapy for pancreatic cancer Ordered empirically on Diflucan which has now been converted to oral  Chart review today, patient has been able to advance to solid diet  Continues on supportive GI therapy  GI will sign off, available if needed.

## 2022-10-08 NOTE — Discharge Summary (Signed)
Physician Discharge Summary  Jared [name] HYW:[id number] DOB: [birth year] DOA: 10/02/2022  PCP: [name], MD  Admit date: 10/02/2022 Discharge date: 10/08/2022  Time spent: 60 minutes  Recommendations for Outpatient Follow-up:  Follow-up with Dr. [name] as scheduled. Follow-up with [name], MD in 2 weeks.  On follow-up patient will need a basic metabolic profile done to follow-up on electrolytes and renal function.   Discharge Diagnoses:  Principal Problem:   Chest pain Active Problems:   GERD   Hyperlipidemia   Cancer of pancreas, body (HCC)   Esophagitis   Leukocytosis   Lactic acidosis   Hyperglycemia   Aortic atherosclerosis (HCC)   Bilateral cataracts   Glaucoma   Discharge Condition: Stable and improved.  Diet recommendation: Soft diet  Filed Weights   10/02/22 2104  Weight: 98.8 kg    History of present illness:  HPI per Dr. Calla [name] is a 76 y.o. male with medical history significant of osteoarthritis of multiple sites, cataracts, nephrolithiasis, prostate cancer, GERD, hyperlipidemia, pancreatic cancer receiving his last chemotherapy session on Friday along with Neulasta who is returning for the second day in a row to the emergency department due to epigastric pain radiated to his chest associated with burning sensation in the setting of multiple episodes of nausea, vomiting and diarrhea. No constipation, melena or hematochezia.  No flank pain, dysuria, frequency or hematuria. He denied fever, chills, rhinorrhea, sore throat, wheezing or hemoptysis.  No chest pain, palpitations, diaphoresis, PND, orthopnea or pitting edema of the lower extremities.  No polyuria, polydipsia, polyphagia or blurred vision.    ED course: Initial vital signs were temperature 99.8 F, pulse 50, respiration 20, BP 138/83 mmHg O2 sat 99% on room air.  The patient received LR 1000 mL bolus, fentanyl 50 mcg IVP x 2, ondansetron 4 mg IVP, ondansetron 4 mg  ODT tablet, cefepime, metronidazole and vancomycin.  I added Maalox plus viscous lidocaine, pantoprazole 40 mg IVP every 12 hours and switched pain management to hydromorphone.   Lab work: His urinalysis was hazy, with glucosuria more than 500 and ketonuria 20 mg/deciliter, small hemoglobinuria and rare bacteria microscopic examination.  CBC showed a white count of 36.2 with 92% neutrophils, hemoglobin 13.2 g/dL and platelets 171.  PTT was 22.  Coronavirus and influenza PCR was negative.  Troponin was 9 x 2.  Lipase was normal.  CMP showed a glucose of 162 mg/dL and alkaline phosphatase 131 units/L.  Lactic acid is 2.1 and then 2.0 mmol/L.   Imaging: Portable 1 view chest radiograph with no acute cardiopulmonary finding.  CT abdomen/pelvis with contrast with streaky subsegmental basilar atelectasis, but no infiltrates or effusion.  Heart is normal in size.  No pericardial effusion.  Stable pancreatic body/tail junction region mass with associated atrophy of the pancreatic tail and dilatation of the pancreatic duct.  Stable left adrenal gland lesion, likely adenoma.  Stable bilateral renal calculi.  Status post prostatectomy without findings of metastatic disease.  Aortic atherosclerosis.  Mild thickening of the distal esophagus tach to suggest esophagitis.  Status postcholecystectomy without biliary dilatation.  Please see images and full radiology report for further details.  There was no esophagitis reported on CT scan abdomen/pelvis 1 day prior to admission.  Hospital Course:  #1 chest pain secondary to esophagitis -Patient noted to have gastritis noted on EGD done few months ago. -Patient presented with midsternal chest pain described as a burning log.  Patient noted with associated nausea vomiting and diarrhea on admission  which has since improved. -Patient with no oral thrush noted.. -CT abdomen and pelvis with concerns for mild thickening of distal esophagus suggestive of esophagitis. -Patient  with improvement with burning sensation during the hospitalization, tried some grits the morning of 10/05/2022, and had a dull aching pain which he felt got hung up however improved after liquids and Jell-O. -Patient maintained on PPI twice daily, Carafate, GI cocktail as needed.  -Denied any further nausea vomiting or diarrhea, improvement with chest pain. -Patient maintained on IV antiemetics, pain management. -Patient initially started on clears and diet advanced to a soft diet which he tolerated.  -Due to immunocompromised state in the setting of pancreatic cancer GI consulted and followed the patient during the hospitalization., patient started empirically on IV fluconazole for treatment of Candida esophagitis empirically per GI. -Patient subsequently transition from IV fluconazole to oral fluconazole.   -GI recommended twice daily PPI and 4 times daily Carafate on discharge in addition to 2 weeks of fluconazole. -GI does not feel any role for endoscopy at this time. -Patient be discharged home in stable and improved condition with outpatient follow-up with hematology/oncology and PCP.   2.  GERD -Patient maintained on PPI twice daily as well as Carafate during the hospitalization.     3.  Aortic Atherosclerosis/hyperlipidemia -Currently not on medical therapy at this time. -Hold therapy pending GI symptoms. -Outpatient follow-up with PCP.   4.  History of pancreatic cancer/prostate cancer status post prostatectomy 1990 with chronically elevated PSA. -Being followed by oncology. -Outpatient follow-up with oncology.   5.  Hypokalemia -Repleted during the hospitalization.   -Patient maintained on home regimen potassium supplementation.    6.  Leukocytosis -Likely secondary to GSF administration. -Patient remained afebrile during the hospitalization.   -Leukocytosis trended down.   -Outpatient follow-up with oncology.    7.  Lactic acidosis -Likely secondary to volume  depletion. -Improved with hydration.   -No other workup needed.     8.  Bilateral cataract/glaucoma -Patient was maintained on home regimen brimonidine, dorzolamide, timolol drops. -Outpatient follow-up with ophthalmology.      Procedures: CT abdomen and pelvis 10/02/2022 Chest x-ray 10/02/2022  Consultations: Oncology: Dr. [name] 10/03/2022 Gastroenterology: Dr. Carlean Purl 10/04/2022    Discharge Exam: Vitals:   10/07/22 2023 10/08/22 0428  BP: 121/89 103/68  Pulse: 62 (!) 50  Resp: 20 20  Temp: 99.3 F (37.4 C) 98.1 F (36.7 C)  SpO2: 99% 97%    General: NAD Cardiovascular: RRR no murmurs rubs or gallops.  No JVD.  No lower extremity edema. Respiratory: Clear to auscultation bilaterally.  No wheezes, no crackles, no rhonchi.  Discharge Instructions   Discharge Instructions     Diet general   Complete by: As directed    Soft diet   Increase activity slowly   Complete by: As directed       Allergies as of 10/08/2022       Reactions   Claritin-d 24 Hour [loratadine-pseudoephedrine Er] Other (See Comments)   Eye pressure issues   Lupron [leuprolide] Itching, Other (See Comments)   Caused infection on hip         Medication List     TAKE these medications    acetaminophen 325 MG tablet Commonly known as: TYLENOL Take 325 mg by mouth daily as needed for headache.   ascorbic acid 500 MG tablet Commonly known as: VITAMIN C Take 500 mg by mouth daily.   Ayr 0.65 % nasal spray Generic drug: sodium chloride Place 2-3 sprays  into the nose as needed for congestion.   brimonidine 0.15 % ophthalmic solution Commonly known as: ALPHAGAN Place 1 drop into the left eye in the morning and at bedtime.   cholecalciferol 25 MCG (1000 UNIT) tablet Commonly known as: VITAMIN D3 Take 1,000 Units by mouth daily.   Dorzolamide HCl-Timolol Mal PF 2-0.5 % Soln Place 1 drop into the left eye 2 (two) times daily.   fluconazole 200 MG tablet Commonly known as:  DIFLUCAN Take 1 tablet (200 mg total) by mouth daily for 10 days. Start taking on: October 09, 2022 What changed: You were already taking a medication with the same name, and this prescription was added. Make sure you understand how and when to take each.   fluconazole 100 MG tablet Commonly known as: DIFLUCAN Take 1 tablet (100 mg total) by mouth daily as needed (thrush). Start taking on: October 20, 2022 What changed:  when to take this reasons to take this These instructions start on October 20, 2022. If you are unsure what to do until then, ask your doctor or other care provider.   Garlic 7510 MG Caps Take 1,000 mg by mouth daily.   Gemtesa 75 MG Tabs Generic drug: Vibegron Take 75 mg by mouth daily.   HYDROmorphone 4 MG tablet Commonly known as: Dilaudid Take 1 tablet (4 mg total) by mouth every 6 (six) hours as needed for severe pain.   ibuprofen 800 MG tablet Commonly known as: ADVIL Take 1 tablet (800 mg total) by mouth every 8 (eight) hours as needed.   lidocaine-prilocaine cream Commonly known as: EMLA Apply 1 Application topically as needed.   olopatadine 0.1 % ophthalmic solution Commonly known as: PATANOL Place 1 drop into both eyes as needed for allergies.   ondansetron 8 MG tablet Commonly known as: ZOFRAN Take 1 tablet (8 mg total) by mouth every 8 (eight) hours as needed for nausea or vomiting (Starting day 3 after chemo as needed for nausea(received Aloxi with chemo)).   pantoprazole 40 MG tablet Commonly known as: PROTONIX Take 1 tablet (40 mg total) by mouth 2 (two) times daily.   potassium chloride SA 20 MEQ tablet Commonly known as: KLOR-CON M Take 1 tablet (20 mEq total) by mouth daily.   prochlorperazine 10 MG tablet Commonly known as: COMPAZINE Take 1 tablet (10 mg total) by mouth every 6 (six) hours as needed for nausea or vomiting.   Refresh Optive PF 0.5-0.9 % Soln Generic drug: Carboxymethylcell-Glycerin PF Place 1 drop into both  eyes as needed (dry eyes).   sucralfate 1 g tablet Commonly known as: CARAFATE Dissolve 1 tablet in 79m of water and mix into a slurry. Then take slurry by mouth every 6 (six) hours.   triptorelin 11.25 MG injection Commonly known as: TRELESTAR LA Inject 11.25 mg into the muscle as needed (PSA).   TUMS ULTRA 1000 PO Take 2,000-3,000 mg by mouth See admin instructions. 3000 mg by mouth in the morning and 2000 mg by mouth at night       Allergies  Allergen Reactions   Claritin-D 24 Hour [Loratadine-Pseudoephedrine Er] Other (See Comments)    Eye pressure issues   Lupron [Leuprolide] Itching and Other (See Comments)    Caused infection on hip     Follow-up Information     CLind Covert MD. Schedule an appointment as soon as possible for a visit in 2 week(s).   Specialty: Family Medicine Contact information: [phone]  Ladell Pier, MD Follow up.   Specialty: Oncology Why: f/u as scheduled. Contact information: [place] [address] [phone]                  The results of significant diagnostics from this hospitalization (including imaging, microbiology, ancillary and laboratory) are listed below for reference.    Significant Diagnostic Studies: CT ABDOMEN PELVIS W CONTRAST  Result Date: 10/02/2022 CLINICAL DATA:  Epigastric abdominal pain with nausea, vomiting and diarrhea. Recent diagnosis of pancreatic cancer. EXAM: CT ABDOMEN AND PELVIS WITH CONTRAST TECHNIQUE: Multidetector CT imaging of the abdomen and pelvis was performed using the standard protocol following bolus administration of intravenous contrast. RADIATION DOSE REDUCTION: This exam was performed according to the departmental dose-optimization program which includes automated exposure control, adjustment of the mA and/or kV according to patient size and/or use of iterative reconstruction technique.  CONTRAST:  137m OMNIPAQUE IOHEXOL 300 MG/ML  SOLN COMPARISON:  CT scan 07/15/2022 and 10/01/2022 FINDINGS: Lower chest: Streaky subsegmental basilar atelectasis but no infiltrates or effusions. The heart is normal in size. No pericardial effusion. Mild thickening of the distal esophagus could suggest esophagitis. Hepatobiliary: No hepatic lesions to suggest metastatic disease. The gallbladder is surgically absent. No intra or extrahepatic biliary dilatation. Pancreas: Stable pancreatic body/tail junction region mass. Associated atrophy of the pancreatic tail and dilatation of the pancreatic duct. The pancreatic head is unremarkable and stable. Spleen: Normal size. No focal lesions. Adrenals/Urinary Tract: Stable 15 mm left adrenal gland lesions unchanged since 2021 and likely benign adenoma. The right adrenal gland is normal. No renal lesions or hydronephrosis. Stable bilateral renal calculi. The bladder is unremarkable. It does contain contrast from yesterday CT scan. Stomach/Bowel: The stomach, duodenum, small bowel and colon are unremarkable. No acute inflammatory changes, mass lesions or obstructive findings. Vascular/Lymphatic: Stable scattered atherosclerotic calcifications involving the aorta and iliac arteries but no aneurysm or dissection. The aortic branch vessels are patent. The major venous structures are patent. Reproductive: Status post prostatectomy. Other: No pelvic mass or adenopathy. No free pelvic fluid collections. No inguinal mass or adenopathy. No abdominal wall hernia or subcutaneous lesions. Musculoskeletal: No significant bony findings. Stable sclerotic lesion in the sacrum likely benign bone island. IMPRESSION: 1. Stable pancreatic body/tail junction region mass with associated atrophy of the pancreatic tail and dilatation of the pancreatic duct. 2. Stable left adrenal gland lesion, likely benign adenoma. 3. Stable bilateral renal calculi. 4. Status post prostatectomy. No findings for  metastatic disease. 5. Status post cholecystectomy. No biliary dilatation. 6. Mild thickening of the distal esophagus could suggest esophagitis. 7. Aortic atherosclerosis. Aortic Atherosclerosis (ICD10-I70.0). Electronically Signed   By: PMarijo SanesM.D.   On: 10/02/2022 07:56   DG Chest Port 1 View  Result Date: 10/02/2022 CLINICAL DATA:  Chest burning. EXAM: PORTABLE CHEST 1 VIEW COMPARISON:  07/31/2022 FINDINGS: Stable asymmetric elevation left hemidiaphragm. Left Port-A-Cath again noted. The lungs are clear without focal pneumonia, edema, pneumothorax or pleural effusion. Cardiopericardial silhouette is at upper limits of normal for size. Bones are diffusely demineralized. IMPRESSION: Stable exam. No acute cardiopulmonary findings. Electronically Signed   By: EMisty StanleyM.D.   On: 10/02/2022 06:20   CT ABDOMEN PELVIS W CONTRAST  Result Date: 10/01/2022 CLINICAL DATA:  Pancreatic cancer diagnosed 07/05/2022 status post chemotherapy. Remote prostatectomy for prostate cancer. Abdominal pain, nausea and vomiting. * Tracking Code: BO * EXAM: CT ABDOMEN AND PELVIS WITH CONTRAST TECHNIQUE: Multidetector CT imaging of the abdomen and pelvis was performed using  the standard protocol following bolus administration of intravenous contrast. RADIATION DOSE REDUCTION: This exam was performed according to the departmental dose-optimization program which includes automated exposure control, adjustment of the mA and/or kV according to patient size and/or use of iterative reconstruction technique. CONTRAST:  131m OMNIPAQUE IOHEXOL 300 MG/ML  SOLN COMPARISON:  06/14/2022 CT abdomen/pelvis. FINDINGS: Lower chest: Basilar right lower lobe 0.4 cm solid pulmonary nodule (series 5/image 26), unchanged. No acute abnormality at the lung bases. Hepatobiliary: Normal liver size. No liver mass. Cholecystectomy. No biliary ductal dilatation. Pancreas: Poorly marginated hypodense 3.5 x 2.6 cm distal pancreatic body mass (series  2/image 25), mildly decreased from 3.9 x 3.3 cm. Persistent pancreatic duct dilation (6 mm diameter) and parenchymal atrophy in the pancreatic tail. Spleen: Normal size. No mass. Adrenals/Urinary Tract: Heterogeneous 2.8 x 2.0 cm left adrenal nodule with density 33 HU, increased from 2.3 x 1.6 cm and newly heterogeneous in density. Normal right adrenal. A few scattered nonobstructing right renal stones, largest 6 mm in the lower right kidney. Nonobstructing 3 mm lower left renal stone. No hydronephrosis. Simple 1.3 cm lower left renal cyst, for which no follow-up imaging is recommended. Normal bladder. Stomach/Bowel: Normal non-distended stomach. Normal caliber small bowel with no small bowel wall thickening. Clustered small 3 mm calcified appendicoliths near the cecal base. Otherwise normal appendix. Mild sigmoid diverticulosis with no large bowel wall thickening or significant pericolonic fat stranding. Vascular/Lymphatic: Atherosclerotic nonaneurysmal abdominal aorta. Patent hepatic, portal and renal veins. Slightly decreased moderate extrinsic narrowing of the proximal splenic vein by the pancreatic neoplasm. No pathologically enlarged lymph nodes in the abdomen or pelvis. Reproductive: Prostatectomy. Other: No pneumoperitoneum, ascites or focal fluid collection. Musculoskeletal: No aggressive appearing focal osseous lesions. Moderate lumbar spondylosis. IMPRESSION: 1. Poorly marginated hypodense 3.5 cm distal pancreatic body mass, mildly decreased in size since 06/14/2022 CT. Persistent pancreatic duct dilation and parenchymal atrophy in the pancreatic tail. Slightly decreased moderate extrinsic narrowing of the proximal splenic vein by the pancreatic neoplasm. 2. 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 3. No additional potential sites  of metastatic disease in the abdomen or pelvis. 4. Stable basilar right lower lobe 0.4 cm solid pulmonary nodule. 5. No evidence of bowel obstruction or acute bowel inflammation. Mild sigmoid diverticulosis. 6. Nonobstructing bilateral nephrolithiasis. 7.  Aortic Atherosclerosis (ICD10-I70.0). Electronically Signed   By: JIlona SorrelM.D.   On: 10/01/2022 15:54    Microbiology: Recent Results (from the past 240 hour(s))  Resp Panel by RT-PCR (Flu A&B, Covid) Anterior Nasal Swab     Status: None   Collection Time: 10/02/22  6:38 AM   Specimen: Anterior Nasal Swab  Result Value Ref Range Status   SARS Coronavirus 2 by RT PCR NEGATIVE NEGATIVE Final    Comment: (NOTE) SARS-CoV-2 target nucleic acids are NOT DETECTED.  The SARS-CoV-2 RNA is generally detectable in upper respiratory specimens during the acute phase of infection. The lowest concentration of SARS-CoV-2 viral copies this assay can detect is 138 copies/mL. A negative result does not preclude SARS-Cov-2 infection and should not be used as the sole basis for treatment or other patient management decisions. A negative result may occur with  improper specimen collection/handling, submission of specimen other than nasopharyngeal swab, presence of viral mutation(s) within the areas targeted by this assay, and inadequate number of viral copies(<138 copies/mL). A negative result must be combined  with clinical observations, patient history, and epidemiological information. The expected result is Negative.  Fact Sheet for Patients:  EntrepreneurPulse.com.au  Fact Sheet for Healthcare Providers:  IncredibleEmployment.be  This test is no t yet approved or cleared by the [place] FDA and  has been authorized for detection and/or diagnosis of SARS-CoV-2 by FDA under an Emergency Use Authorization (EUA). This EUA will remain  in effect (meaning this test can be used) for the duration of the COVID-19  declaration under Section 564(b)(1) of the Act, 21 U.S.C.section 360bbb-3(b)(1), unless the authorization is terminated  or revoked sooner.       Influenza A by PCR NEGATIVE NEGATIVE Final   Influenza B by PCR NEGATIVE NEGATIVE Final    Comment: (NOTE) The Xpert Xpress SARS-CoV-2/FLU/RSV plus assay is intended as an aid in the diagnosis of influenza from Nasopharyngeal swab specimens and should not be used as a sole basis for treatment. Nasal washings and aspirates are unacceptable for Xpert Xpress SARS-CoV-2/FLU/RSV testing.  Fact Sheet for Patients: EntrepreneurPulse.com.au  Fact Sheet for Healthcare Providers: IncredibleEmployment.be  This test is not yet approved or cleared by the [place] FDA and has been authorized for detection and/or diagnosis of SARS-CoV-2 by FDA under an Emergency Use Authorization (EUA). This EUA will remain in effect (meaning this test can be used) for the duration of the COVID-19 declaration under Section 564(b)(1) of the Act, 21 U.S.C. section 360bbb-3(b)(1), unless the authorization is terminated or revoked.  Performed at [facility], [place] [address], [place], [place] [address]   Blood Culture (routine x 2)     Status: None   Collection Time: 10/02/22  6:55 AM   Specimen: BLOOD  Result Value Ref Range Status   Specimen Description   Final    BLOOD LEFT ANTECUBITAL Performed at [place] [address], [place], [place] [address]    Special Requests   Final    BOTTLES DRAWN AEROBIC AND ANAEROBIC Blood Culture adequate volume Performed at [facility] [address], [place], [place] [address]    Culture   Final    NO GROWTH 5 DAYS Performed at [facility], [place] [address] [place], [place] [address]    Report Status 10/07/2022 FINAL  Final  Blood Culture (routine x 2)     Status: None   Collection Time: 10/02/22  6:58 AM    Specimen: BLOOD RIGHT HAND  Result Value Ref Range Status   Specimen Description   Final    BLOOD RIGHT HAND Performed at [facility] [address], [place], [place] [address]    Special Requests   Final    BOTTLES DRAWN AEROBIC ONLY Blood Culture results may not be optimal due to an inadequate volume of blood received in culture bottles Performed at [facility] [address], [place], [place] [address]    Culture   Final    NO GROWTH 5 DAYS Performed at [facility], [place] [address], [place], [place] [address]    Report Status 10/07/2022 FINAL  Final  Urine Culture     Status: Abnormal   Collection Time: 10/02/22  8:24 AM   Specimen: In/Out Cath Urine  Result Value Ref Range Status   Specimen Description   Final    IN/OUT CATH URINE Performed at [place] [address] [place],  [address]    Special Requests   Final    Normal Performed  at [facility], [place] [address], [place], [place] [address]    Culture (A)  Final    <10,000 COLONIES/mL INSIGNIFICANT GROWTH Performed at [facility] [address] [place], [place] [address]    Report Status 10/03/2022 FINAL  Final     Labs: Basic Metabolic Panel: Recent Labs  Lab 10/04/22 0536 10/05/22 0513 10/06/22 0328 10/07/22 1012 10/08/22 0230  NA 141 135 139 138 140  K 3.3* 3.1* 3.4* 3.6 3.5  CL 107 104 109 106 108  CO2 22 21* '24 25 26  '$ GLUCOSE 89 116* 109* 125* 107*  BUN 8 <5* <5* <5* 5*  CREATININE 0.66 0.63 0.63 0.65 0.71  CALCIUM 8.2* 8.5* 8.5* 8.8* 8.7*  MG 2.1 2.0 1.9  --   --    Liver Function Tests: Recent Labs  Lab 10/01/22 1459 10/02/22 0529 10/03/22 0510  AST 28 33 26  ALT 32 37 30  ALKPHOS 114 131* 100  BILITOT 0.9 0.8 1.2  PROT 5.8* 6.5 5.3*  ALBUMIN 3.2* 3.7 3.1*   Recent Labs  Lab 10/01/22 1459 10/02/22 0529  LIPASE 29 27   No results for input(s): "AMMONIA" in the last 168  hours. CBC: Recent Labs  Lab 10/01/22 1459 10/02/22 0529 10/03/22 0510 10/04/22 0536 10/05/22 0513 10/06/22 0328 10/07/22 1012  WBC 45.2* 36.2* 15.7* 14.4* 17.9* 10.7* 10.2  NEUTROABS 40.7* 33.2*  --   --  14.4*  --   --   HGB 12.4* 13.2 11.5* 10.8* 12.8* 10.9* 12.1*  HCT 36.7* 39.4 35.4* 33.2* 38.1* 32.5* 36.5*  MCV 87.6 88.7 90.3 89.7 87.4 87.6 88.4  PLT 164 171 134* 134* 150 147* 181   Cardiac Enzymes: No results for input(s): "CKTOTAL", "CKMB", "CKMBINDEX", "TROPONINI" in the last 168 hours. BNP: BNP (last 3 results) No results for input(s): "BNP" in the last 8760 hours.  ProBNP (last 3 results) No results for input(s): "PROBNP" in the last 8760 hours.  CBG: No results for input(s): "GLUCAP" in the last 168 hours.     Signed:  Irine Seal MD.  [facility] 10/08/2022, 12:26 PM

## 2022-10-09 ENCOUNTER — Ambulatory Visit (HOSPITAL_BASED_OUTPATIENT_CLINIC_OR_DEPARTMENT_OTHER)
Admission: RE | Admit: 2022-10-09 | Discharge: 2022-10-09 | Disposition: A | Discharge status: Home / Self Care | Payer: Medicare HMO | Source: Ambulatory Visit | Attending: General Surgery | Admitting: General Surgery

## 2022-10-09 ENCOUNTER — Encounter (HOSPITAL_BASED_OUTPATIENT_CLINIC_OR_DEPARTMENT_OTHER): Payer: Self-pay

## 2022-10-09 ENCOUNTER — Telehealth: Payer: Self-pay

## 2022-10-09 ENCOUNTER — Other Ambulatory Visit (HOSPITAL_BASED_OUTPATIENT_CLINIC_OR_DEPARTMENT_OTHER): Payer: Self-pay | Admitting: General Surgery

## 2022-10-09 DIAGNOSIS — C252 Malignant neoplasm of tail of pancreas: Secondary | ICD-10-CM | POA: Diagnosis not present

## 2022-10-09 DIAGNOSIS — R978 Other abnormal tumor markers: Secondary | ICD-10-CM

## 2022-10-09 DIAGNOSIS — Z8546 Personal history of malignant neoplasm of prostate: Secondary | ICD-10-CM

## 2022-10-09 DIAGNOSIS — Z809 Family history of malignant neoplasm, unspecified: Secondary | ICD-10-CM | POA: Diagnosis present

## 2022-10-09 MED ORDER — IOHEXOL 300 MG/ML  SOLN
100.0000 mL | Freq: Once | INTRAMUSCULAR | Status: AC | PRN
  Administered 2022-10-09: 85 mL via INTRAVENOUS

## 2022-10-09 MED ORDER — HEPARIN SOD (PORK) LOCK FLUSH 100 UNIT/ML IV SOLN
500.0000 [IU] | Freq: Once | INTRAVENOUS | Status: AC
  Administered 2022-10-09: 500 [IU] via INTRAVENOUS

## 2022-10-09 NOTE — Addendum Note (Signed)
Addended by: Talbert Cage L on: 10/09/2022 10:04 AM   Modules accepted: Orders

## 2022-10-09 NOTE — Telephone Encounter (Signed)
Transition Care Management Follow-up Telephone Call Date of discharge and from where: [name] 10[facility]22 How have you been since you were released from the hospital? better Any questions or concerns? No  Items Reviewed: Did the pt receive and understand the discharge instructions provided? Yes  Medications obtained and verified? Yes  Other? No  Any new allergies since your discharge? No  Dietary orders reviewed? Yes Do you have support at home? Yes   Home Care and Equipment/Supplies: Were home health services ordered? no If so, what is the name of the agency? N/a  Has the agency set up a time to come to the patient's home? not applicable Were any new equipment or medical supplies ordered?  No What is the name of the medical supply agency? N/a Were you able to get the supplies/equipment? not applicable Do you have any questions related to the use of the equipment or supplies? No  Functional Questionnaire: (I = Independent and D = Dependent) ADLs: I  Bathing/Dressing- I  Meal Prep- I  Eating- I  Maintaining continence- I  Transferring/Ambulation- I  Managing Meds- I  Follow up appointments reviewed:  PCP Hospital f/u appt confirmed? Yes  Scheduled to see Dr Erin Hearing on 10/16/2022 @ 9:50. [facility] f/u appt confirmed? Yes  Scheduled to see Dr [name] on 10/10/2022 @ 9:15. Are transportation arrangements needed? No  If their condition worsens, is the pt aware to call PCP or go to the Emergency Dept.? Yes Was the patient provided with contact information for the PCP's office or ED? Yes Was to pt encouraged to call back with questions or concerns? Yes Juanda Crumble, LPN [place] Direct Dial [phone]

## 2022-10-10 ENCOUNTER — Inpatient Hospital Stay: Payer: Medicare HMO

## 2022-10-10 ENCOUNTER — Encounter: Payer: Self-pay | Admitting: Nurse Practitioner

## 2022-10-10 ENCOUNTER — Inpatient Hospital Stay (HOSPITAL_BASED_OUTPATIENT_CLINIC_OR_DEPARTMENT_OTHER): Payer: Medicare HMO | Admitting: Nurse Practitioner

## 2022-10-10 DIAGNOSIS — Z5189 Encounter for other specified aftercare: Secondary | ICD-10-CM | POA: Diagnosis not present

## 2022-10-10 DIAGNOSIS — Z452 Encounter for adjustment and management of vascular access device: Secondary | ICD-10-CM | POA: Diagnosis not present

## 2022-10-10 DIAGNOSIS — C258 Malignant neoplasm of overlapping sites of pancreas: Secondary | ICD-10-CM | POA: Diagnosis present

## 2022-10-10 DIAGNOSIS — Z8546 Personal history of malignant neoplasm of prostate: Secondary | ICD-10-CM | POA: Diagnosis not present

## 2022-10-10 DIAGNOSIS — C251 Malignant neoplasm of body of pancreas: Secondary | ICD-10-CM | POA: Diagnosis not present

## 2022-10-10 NOTE — Progress Notes (Signed)
[facility] OFFICE PROGRESS NOTE   Diagnosis: Pancreas cancer  INTERVAL HISTORY:   [name] returns as scheduled.  He completed cycle 5 FOLFIRINOX 09/26/2022.  He was hospitalized 10/02/2022 through 10/08/2022 presenting with chest pain, ultimately felt to be due to esophagitis.  He is feeling better.  No further chest discomfort.  He is tolerating a soft diet.  No nausea or vomiting.  No mouth sores.  No diarrhea.  Objective:  Vital signs in last 24 hours:  Blood pressure 113/87, pulse 62, temperature 98.1 F (36.7 C), temperature source Oral, resp. rate 18, height 6' (1.829 m), weight 222 lb 1.6 oz (100.7 kg), SpO2 100 %.    HEENT: No thrush or ulcers. Resp: Lungs clear bilaterally. Cardio: Regular rate and rhythm. GI: No hepatosplenomegaly. Vascular: Trace edema lower leg bilaterally. Neuro: Alert and oriented. Port-A-Cath without erythema.  Lab Results:  Lab Results  Component Value Date   WBC 10.2 10/07/2022   HGB 12.1 (L) 10/07/2022   HCT 36.5 (L) 10/07/2022   MCV 88.4 10/07/2022   PLT 181 10/07/2022   NEUTROABS 14.4 (H) 10/05/2022    Imaging:  CT ABDOMEN W WO CONTRAST  Result Date: 10/09/2022 CLINICAL DATA:  Pancreatic cancer staging. History of chronic kidney disease and prostate cancer. * Tracking Code: BO * EXAM: CT ABDOMEN WITHOUT AND WITH CONTRAST TECHNIQUE: Multidetector CT imaging of the abdomen was performed following the standard protocol before and following the bolus administration of intravenous contrast. RADIATION DOSE REDUCTION: This exam was performed according to the departmental dose-optimization program which includes automated exposure control, adjustment of the mA and/or kV according to patient size and/or use of iterative reconstruction technique. CONTRAST:  66m OMNIPAQUE IOHEXOL 300 MG/ML  SOLN COMPARISON:  Abdominopelvic CT 10/02/2022, 10/01/2022 and 07/15/2022. PET-CT 01/30/2021. FINDINGS: Lower chest: Stable mild linear  scarring at both lung bases. No pleural or pericardial effusion. No suspicious pulmonary nodules. Hepatobiliary: The liver is normal in density without suspicious focal abnormality. No significant biliary dilatation status post cholecystectomy. Pancreas: Hypoenhancing mass within the proximal pancreatic tail remains ill-defined, measuring approximately 3.1 x 2.1 cm on image 19 of series 9 (portal phase images). There is persistent atrophy and pancreatic ductal dilatation within the pancreatic tail. This mass results in persistent extrinsic mass effect on the splenic vein which remains patent. There is no mass effect on the portal or superior mesenteric veins. No evidence of arterial encasement. Spleen: Normal in size without focal abnormality. Adrenals/Urinary Tract: The right adrenal gland appears normal. Heterogeneously enhancing left adrenal nodule measures 2.7 x 2.0 cm on image 22/9, unchanged from recent prior studies. Prior to contrast, this has low-density components measuring up to 12 HU. Following contrast, there is nonspecific enhancement and washout of this lesion. As previously mentioned, this nodule has mildly enlarged and become more heterogeneous compared with older prior studies although has been present on prior examinations dating back to 2013 and was not hypermetabolic on PET-CT performed 01/30/2021. Nonobstructing bilateral renal calculi. No evidence of hydronephrosis, proximal ureteral calculus or enhancing renal mass. Bladder not imaged. Stomach/Bowel: No enteric contrast administered. The stomach appears unremarkable for its degree of distension. No evidence of bowel wall thickening, distention or surrounding inflammatory change. Vascular/Lymphatic: There are no enlarged abdominal lymph nodes. Mild aortic and branch vessel atherosclerosis without aneurysm, large vessel occlusion or arterial encasement. As above, persistent extrinsic mass effect on the splenic vein which remains patent. Other:  No ascites or peritoneal nodularity. Musculoskeletal: No acute or significant osseous findings. Mild  spondylosis. IMPRESSION: 1. 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 There is persistent extrinsic mass effect on the splenic vein which remains patent. 2. No definite evidence of abdominal metastatic disease or vascular encasement. 3. Indeterminate left adrenal nodule, mildly enlarged and increasingly heterogeneous from older prior studies. The possibility of a collision lesion (metastasis within a pre-existing adenoma) cannot be excluded by this examination. If it would affect the patient's management, consider follow-up PET-CT. 4. Nonobstructing bilateral renal calculi. 5.  Aortic Atherosclerosis (ICD10-I70.0). Electronically Signed   By: Richardean Sale M.D.   On: 10/09/2022 17:53    Medications: I have reviewed the patient's current medications.  Assessment/Plan: Pancreas cancer FNA biopsy of a pancreas body/tail mass 07/05/2022-adenocarcinoma CT abdomen/pelvis 06/14/2022-hypoenhancing pancreas body/tail mass with effacement of the splenic vein, no evidence of lymphadenopathy or metastatic disease CA 19-9 on 06/18/2022-767 EUS 07/05/2022-a 7 x 29 mm pancreas body/tail mass, abutment of the splenic artery, no malignant appearing lymph nodes, T2N0 by EUS CTs 07/15/2022-pancreas body/tail mass with no evidence of metastatic disease, splenic vein associated with the tumor, no arterial involvement, stable right greater than left lung nodules favored benign Cycle 1 FOLFOX 08/01/2022 Cycle 2 FOLFOX, 08/16/2022, neulasta Cycle 3 FOLFIRINOX 08/29/2022, Neulasta Cycle 4 FOLFIRINOX 09/12/2022, Neulasta, irinotecan and 5-fluorouracil dose reduced due to diarrhea/weight loss Cycle 5 FOLFIRINOX 09/26/2022 CT abdomen/pelvis 10/09/2022-no significant change in the appearance of the ill-defined pancreatic tail mass.  Persistent extrinsic  mass effect on the splenic vein which remains patent.  Indeterminate left adrenal nodule. Prostate cancer, status post prostatectomy 1990 Chronic elevation of the PSA-maintained on Trelstar, followed by Dr. Jeffie Pollock   3.  Kidney stones 4.   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  Abdominal pain, likely secondary to #1 6.  Family history of prostate and pancreas cancer 7.  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  Typhlitis in 2014 9.  Colon polyps-tubular adenomas on colonoscopy 03/27/2022 10.  Presentation emergency room 10/01/2022 and 10/02/2022 with nausea/vomiting, diarrhea, and chest pain    Disposition: Mr. Lesch appears stable.  He has completed 5 cycles of FOLFIRINOX.  Due to poor tolerance no further chemotherapy is planned at this time.  His case will be presented at an upcoming GI tumor conference.  He will follow-up with Dr. Barry Dienes later this month regarding surgery.  He will return for follow-up here in early January.  Patient seen with Dr. [name].    [name] ANP/GNP-BC   10/10/2022  9:38 AM This was a shared visit with [name].  Mr. Sturtevant has recovered from the recent hospital admission with GI symptoms.  He had clinical evidence of esophagitis.  He is unclear whether related to chemotherapy or another etiology.  The restaging CT reveals no evidence of disease progression.  He will see Dr. Barry Dienes to consider resection of the pancreas mass.  His case will be presented at the GI tumor conference.  I was present for greater than 50% of today's visit.  I performed medical decision making.  Julieanne Manson, MD

## 2022-10-11 ENCOUNTER — Other Ambulatory Visit: Payer: Self-pay

## 2022-10-12 ENCOUNTER — Inpatient Hospital Stay: Payer: Medicare HMO

## 2022-10-15 NOTE — Progress Notes (Unsigned)
SUBJECTIVE:   CHIEF COMPLAINT / HPI:   Abdomen Pain Dehydration Admitted to hospital discharge on 12/11.  Since then has been feeling well.  No abdomen pain or nausea and vomiting and is eating soft foods well.   Feels he is slowly gaining some weight back.  Went over all his medications   "Hospital Course:  #1 chest pain secondary to esophagitis -Patient noted to have gastritis noted on EGD done few months ago. -Patient presented with midsternal chest pain described as a burning log.  Patient noted with associated nausea vomiting and diarrhea on admission which has since improved. -Patient with no oral thrush noted.. -CT abdomen and pelvis with concerns for mild thickening of distal esophagus suggestive of esophagitis. -Patient with improvement with burning sensation during the hospitalization, tried some grits the morning of 10/05/2022, and had a dull aching pain which he felt got hung up however improved after liquids and Jell-O. -Patient maintained on PPI twice daily, Carafate, GI cocktail as needed.  -Denied any further nausea vomiting or diarrhea, improvement with chest pain. -Patient maintained on IV antiemetics, pain management. -Patient initially started on clears and diet advanced to a soft diet which he tolerated.  -Due to immunocompromised state in the setting of pancreatic cancer GI consulted and followed the patient during the hospitalization., patient started empirically on IV fluconazole for treatment of Candida esophagitis empirically per GI. -Patient subsequently transition from IV fluconazole to oral fluconazole.   -GI recommended twice daily PPI and 4 times daily Carafate on discharge in addition to 2 weeks of fluconazole. -GI does not feel any role for endoscopy at this time. -Patient be discharged home in stable and improved condition with outpatient follow-up with hematology/oncology and PCP.   2.  GERD -Patient maintained on PPI twice daily as well as Carafate  during the hospitalization.  "   OBJECTIVE:   BP 122/62   Pulse 77   Ht 6' (1.829 m)   Wt 216 lb (98 kg)   SpO2 100%   BMI 29.29 kg/m   Weight down about 10 lbs from last visit Heart - Regular rate and rhythm.  No murmurs, gallops or rubs.    Lungs:  Normal respiratory effort, chest expands symmetrically. Lungs are clear to auscultation, no crackles or wheezes. Abdomen: soft and non-tender without masses, organomegaly or hernias noted.  No guarding or rebound Good spirits  Accompanied by his wife  ASSESSMENT/PLAN:   Generalized abdominal pain Assessment & Plan: Thought to be secondary to espohagitis.  Has resolved.  Tolerating high dose PPI and carafate with resumption of appetite and no pain.  Continue these for at least 1-2 months.  Check bmet today   Orders: -     Basic metabolic panel  Other osteoarthritis involving multiple joints Assessment & Plan: Stable.  Recommend he not take NSAIDs given gi issues and to use tylenol before taking hydromorphone which should not be a long time medication       Patient Instructions  Good to see you today - Thank you for coming in  Things we discussed today:  Try Tylenol 2 xtra strength before hydromorphone if you have pain Do not take ibuprofen  Continue your other medications as you are.  You may wean off the carafate in a few months   I will call you if your tests are not good.  Otherwise, I will send you a message on MyChart (if it is active) or a letter in the mail..  If you  do not hear from me with in 2 weeks please call our office.      Please always bring your medication bottles  Come back to see me in 3-6 months as needed   [place], [place]

## 2022-10-16 ENCOUNTER — Ambulatory Visit (INDEPENDENT_AMBULATORY_CARE_PROVIDER_SITE_OTHER): Payer: Medicare HMO | Admitting: Family Medicine

## 2022-10-16 ENCOUNTER — Inpatient Hospital Stay (HOSPITAL_BASED_OUTPATIENT_CLINIC_OR_DEPARTMENT_OTHER): Payer: Medicare HMO | Admitting: Genetic Counselor

## 2022-10-16 ENCOUNTER — Encounter: Payer: Self-pay | Admitting: Family Medicine

## 2022-10-16 VITALS — BP 122/62 | HR 77 | Ht 72.0 in | Wt 216.0 lb

## 2022-10-16 DIAGNOSIS — Z800 Family history of malignant neoplasm of digestive organs: Secondary | ICD-10-CM

## 2022-10-16 DIAGNOSIS — R1084 Generalized abdominal pain: Secondary | ICD-10-CM

## 2022-10-16 DIAGNOSIS — Z8546 Personal history of malignant neoplasm of prostate: Secondary | ICD-10-CM | POA: Diagnosis not present

## 2022-10-16 DIAGNOSIS — M158 Other polyosteoarthritis: Secondary | ICD-10-CM

## 2022-10-16 DIAGNOSIS — Z1379 Encounter for other screening for genetic and chromosomal anomalies: Secondary | ICD-10-CM

## 2022-10-16 DIAGNOSIS — C251 Malignant neoplasm of body of pancreas: Secondary | ICD-10-CM | POA: Diagnosis not present

## 2022-10-16 DIAGNOSIS — Z8042 Family history of malignant neoplasm of prostate: Secondary | ICD-10-CM

## 2022-10-16 NOTE — Patient Instructions (Addendum)
Good to see you today - Thank you for coming in  Things we discussed today:  Try Tylenol 2 xtra strength before hydromorphone if you have pain Do not take ibuprofen  Continue your other medications as you are.  You may wean off the carafate in a few months   I will call you if your tests are not good.  Otherwise, I will send you a message on MyChart (if it is active) or a letter in the mail..  If you do not hear from me with in 2 weeks please call our office.      Please always bring your medication bottles  Come back to see me in 3-6 months as needed   Happy Holidays

## 2022-10-16 NOTE — Assessment & Plan Note (Signed)
Thought to be secondary to espohagitis.  Has resolved.  Tolerating high dose PPI and carafate with resumption of appetite and no pain.  Continue these for at least 1-2 months.  Check bmet today

## 2022-10-16 NOTE — Assessment & Plan Note (Signed)
Stable.  Recommend he not take NSAIDs given gi issues and to use tylenol before taking hydromorphone which should not be a long time medication

## 2022-10-17 ENCOUNTER — Encounter: Payer: Self-pay | Admitting: Oncology

## 2022-10-17 ENCOUNTER — Encounter: Payer: Self-pay | Admitting: Genetic Counselor

## 2022-10-17 ENCOUNTER — Other Ambulatory Visit: Payer: Self-pay

## 2022-10-17 DIAGNOSIS — Z8042 Family history of malignant neoplasm of prostate: Secondary | ICD-10-CM | POA: Insufficient documentation

## 2022-10-17 DIAGNOSIS — Z800 Family history of malignant neoplasm of digestive organs: Secondary | ICD-10-CM | POA: Insufficient documentation

## 2022-10-17 LAB — BASIC METABOLIC PANEL
BUN/Creatinine Ratio: 9 — ABNORMAL LOW (ref 10–24)
BUN: 8 mg/dL (ref 8–27)
CO2: 21 mmol/L (ref 20–29)
Calcium: 9.5 mg/dL (ref 8.6–10.2)
Chloride: 105 mmol/L (ref 96–106)
Creatinine, Ser: 0.91 mg/dL (ref 0.76–1.27)
Glucose: 136 mg/dL — ABNORMAL HIGH (ref 70–99)
Potassium: 4.8 mmol/L (ref 3.5–5.2)
Sodium: 141 mmol/L (ref 134–144)
eGFR: 87 mL/min/{1.73_m2} (ref >59)

## 2022-10-17 NOTE — Progress Notes (Signed)
The proposed treatment discussed in conference is for discussion purpose only and is not a binding recommendation.  The patients have not been physically examined, or presented with their treatment options.  Therefore, final treatment plans cannot be decided.  

## 2022-10-17 NOTE — Progress Notes (Signed)
REFERRING PROVIDER: Betsy Coder, MD  PRIMARY PROVIDER:  [name], MD  PRIMARY REASON FOR VISIT:  Encounter Diagnoses  Name Primary?   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 male, was seen for a [place] cancer genetics consultation at the request of Dr. [name] due to a personal and family history of cancer.  Jared [name] presents to clinic today to discuss the possibility of a hereditary predisposition to cancer, to discuss genetic testing, and to further clarify his future cancer risks, as well as potential cancer risks for family members.   Jared [name] has a personal history of prostate cancer diagnosed at age 67 and pancreatic cancer diagnosed at age 52.  CANCER HISTORY:  Oncology History  Cancer of pancreas, body ([place])  07/17/2022 Initial Diagnosis   Cancer of pancreas, body ([place])   07/17/2022 Cancer Staging   Staging form: Exocrine Pancreas, AJCC 8th Edition - Clinical: Stage IB (cT2, cN0, cM0) - Signed by Ladell Pier, MD on 07/17/2022 Total positive nodes: 0   08/01/2022 -  Chemotherapy   Patient is on Treatment Plan : PANCREAS Modified FOLFIRINOX q14d x 4 cycles     08/10/2022 Genetic Testing   Negative genetic testing.  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  Report date is 08/10/2022.   The CancerNext-Expanded gene panel offered by [facility] and includes sequencing, rearrangement, and RNA analysis for the following 77 genes: AIP, ALK, APC, ATM, AXIN2, BAP1, BARD1, BLM, BMPR1A, BRCA1, BRCA2, BRIP1, CDC73, CDH1, CDK4, CDKN1B, CDKN2A, CHEK2, CTNNA1, DICER1, FANCC, FH, FLCN, GALNT12, KIF1B, LZTR1, MAX, MEN1, MET, MLH1, MSH2, MSH3, MSH6, MUTYH, NBN, NF1, NF2, NTHL1, PALB2, PHOX2B, PMS2, POT1, PRKAR1A, PTCH1, PTEN, RAD51C, RAD51D, RB1, RECQL, RET, SDHA,  SDHAF2, SDHB, SDHC, SDHD, SMAD4, SMARCA4, SMARCB1, SMARCE1, STK11, SUFU, TMEM127, TP53, TSC1, TSC2, VHL and XRCC2 (sequencing and deletion/duplication); EGFR, EGLN1, HOXB13, KIT, MITF, PDGFRA, POLD1, and POLE (sequencing only); EPCAM and GREM1 (deletion/duplication only).       Past Medical History:  Diagnosis Date   Aortic atherosclerosis ([place]) 10/02/2022   Arthritis    SHOULDER & KNEES   Bilateral cataracts 10/02/2022   Cataract    bilateral sx   Choledocholithiasis 07/23/2022   Chronic kidney disease    pt states he does not have kidney disease, just a hx of kidney stones   GERD 02/22/2009   Qualifier: Diagnosis of   By: Carlean Purl MD, Tonna Boehringer E      Glaucoma 10/02/2022   History of kidney stones    Hyperlipidemia 09/06/2021   The 10-year ASCVD risk score (Arnett DK, et al., 2019) is: 12.5%    Values used to calculate the score:      Age: 8 years      Sex: Male      Is Non-Hispanic African American: Yes      Diabetic: No      Tobacco smoker: No      Systolic Blood Pressure: 741 mmHg      Is BP treated: No      HDL Cholesterol: 82 mg/dL      Total Cholesterol: 241 mg/dL      Inguinal hernia    right   Leukocytosis 10/02/2022   Pancreatic cancer ([place]) 2023   Prostate  cancer ([place]) 10/29/1988    Past Surgical History:  Procedure Laterality Date   CATARACT EXTRACTION Bilateral    CHOLECYSTECTOMY N/A 07/14/2013   Procedure: LAPAROSCOPIC CHOLECYSTECTOMY;  Surgeon: Stark Klein, MD;  Location: [facility];  Service: General;  Laterality: N/A;   COLONOSCOPY  01/07/2004   Dr. Silvano Rusk   COLONOSCOPY  02/2022   CG-MAC-mira(good)-tics-2 polyps   ENDOSCOPIC RETROGRADE CHOLANGIOPANCREATOGRAPHY (ERCP) WITH PROPOFOL N/A 08/23/2022   Procedure: ENDOSCOPIC RETROGRADE CHOLANGIOPANCREATOGRAPHY (ERCP) WITH PROPOFOL;  Surgeon: Irving Copas., MD;  Location: [facility];  Service: Gastroenterology;  Laterality: N/A;   ESOPHAGOGASTRODUODENOSCOPY (EGD) WITH PROPOFOL N/A 07/05/2022    Procedure: ESOPHAGOGASTRODUODENOSCOPY (EGD) WITH PROPOFOL;  Surgeon: Rush Landmark [name] Nab., MD;  Location: [facility];  Service: Gastroenterology;  Laterality: N/A;   EUS N/A 07/05/2022   Procedure: UPPER ENDOSCOPIC ULTRASOUND (EUS) RADIAL;  Surgeon: Irving Copas., MD;  Location: [facility];  Service: Gastroenterology;  Laterality: N/A;   EYE SURGERY     FINE NEEDLE ASPIRATION  07/05/2022   Procedure: FINE NEEDLE ASPIRATION (FNA) LINEAR;  Surgeon: Irving Copas., MD;  Location: [facility];  Service: Gastroenterology;;   INGUINAL HERNIA REPAIR Right    PORTACATH PLACEMENT Left 07/31/2022   Procedure: INSERTION PORT-A-CATH;  Surgeon: Stark Klein, MD;  Location: [facility];  Service: General;  Laterality: Left;   PROSTATECTOMY     REMOVAL OF STONES  08/23/2022   Procedure: REMOVAL OF STONES;  Surgeon: Irving Copas., MD;  Location: [facility];  Service: Gastroenterology;;   Joan Mayans  08/23/2022   Procedure: Joan Mayans;  Surgeon: Mansouraty, [name] Nab., MD;  Location: [facility];  Service: Gastroenterology;;   TONSILLECTOMY      Social History   Socioeconomic History   Marital status: Married    Spouse name: Not on file   Number of children: 1   Years of education: Not on file   Highest education level: Not on file  Occupational History   Occupation: retired  Tobacco Use   Smoking status: Former    Packs/day: 0.50    Years: 20.00    Total pack years: 10.00    Types: Cigarettes    Quit date: 10/29/1981    Years since quitting: 40.9   Smokeless tobacco: Never  Vaping Use   Vaping Use: Never used  Substance and Sexual Activity   Alcohol use: No   Drug use: No   Sexual activity: Yes  Other Topics Concern   Not on file  Social History Narrative   Married to [place]. Retired. 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            Social Determinants of Health   Financial  Resource Strain: Low Risk  (07/26/2022)   Overall Financial Resource Strain (CARDIA)    Difficulty of Paying Living Expenses: Not hard at all  Food Insecurity: No Food Insecurity (10/02/2022)   Hunger Vital Sign    Worried About Running Out of Food in the Last Year: Never true    Ran Out of Food in the Last Year: Never true  Transportation Needs: No Transportation Needs (10/02/2022)   PRAPARE - Hydrologist (Medical): No    Lack of Transportation (Non-Medical): No  Physical Activity: Not on file  Stress: Not on file  Social Connections: Socially Integrated (07/26/2022)   Social Connection and Isolation Panel [NHANES]    Frequency of Communication with Friends and Family: More than three  times a week    Frequency of Social Gatherings with Friends and Family: More than three times a week    Attends Religious Services: More than 4 times per year    Active Member of [employer] or Organizations: Yes    Attends Music therapist: More than 4 times per year    Marital Status: Married     FAMILY HISTORY:  We obtained a detailed, 4-generation family history.  Significant diagnoses are listed below: Family History  Problem Relation Age of Onset   Pancreatic cancer Mother 1   Kidney cancer Mother 42   Thyroid cancer Mother 42   Prostate cancer Father 62   Cancer Sister 72       carcinoid tumor   Prostate cancer Brother 32   Prostate cancer Brother 20   Thyroid cancer Brother 13   Prostate cancer Brother 30   Prostate cancer Brother 64   Pancreatic cancer Paternal Aunt 77   Pancreatic cancer Paternal Aunt 70   Prostate cancer Paternal Uncle 37   Prostate cancer Paternal Uncle 104   Cervical cancer Maternal Grandmother 66   Prostate cancer Paternal Grandfather 85   Colon cancer Neg Hx    Stomach cancer Neg Hx    Esophageal cancer Neg Hx    Colon polyps Neg Hx    Rectal cancer Neg Hx        GENETIC COUNSELING ASSESSMENT: Jared [name] is a 77 y.o.  male with a personal and family history of cancer which is somewhat suggestive of a hereditary predisposition to cancer given his personal and family history of pancreatic and prostate cancer. We discussed Jared [name] genetic testing results during today's session.   GENETIC TEST RESULTS:  The [facility] CancerNext-Expanded Panel found no pathogenic mutations.  The CancerNext-Expanded gene panel offered by [facility] and includes sequencing, rearrangement, and RNA analysis for the following 77 genes: AIP, ALK, APC, ATM, AXIN2, BAP1, BARD1, BLM, BMPR1A, BRCA1, BRCA2, BRIP1, CDC73, CDH1, CDK4, CDKN1B, CDKN2A, CHEK2, CTNNA1, DICER1, FANCC, FH, FLCN, GALNT12, KIF1B, LZTR1, MAX, MEN1, MET, MLH1, MSH2, MSH3, MSH6, MUTYH, NBN, NF1, NF2, NTHL1, PALB2, PHOX2B, PMS2, POT1, PRKAR1A, PTCH1, PTEN, RAD51C, RAD51D, RB1, RECQL, RET, SDHA, SDHAF2, SDHB, SDHC, SDHD, SMAD4, SMARCA4, SMARCB1, SMARCE1, STK11, SUFU, TMEM127, TP53, TSC1, TSC2, VHL and XRCC2 (sequencing and deletion/duplication); EGFR, EGLN1, HOXB13, KIT, MITF, PDGFRA, POLD1, and POLE (sequencing only); EPCAM and GREM1 (deletion/duplication only).    The test report has been scanned into EPIC and is located under the Molecular Pathology section of the Results Review tab.  A portion of the result report is included below for reference. Genetic testing reported out on 08/10/2022.       Genetic testing identified a variant of uncertain significance (VUS) in the [place] gene called p.E640G.  At this time, it is unknown if this variant is associated with an increased risk for cancer or if it is benign, but most uncertain variants are reclassified to benign. It should not be used to make medical management decisions. With time, we suspect the laboratory will determine the significance of this variant, if any. If the laboratory reclassifies this variant, we will attempt to contact Jared [name] to discuss it further.   Even though a pathogenic variant was not identified,  possible explanations for the cancer in the family may include: There may be no hereditary risk for cancer in the family. The cancers in Jared [name] and/or his family may be due to other genetic or environmental factors. There may be  a gene mutation in one of these genes that current testing methods cannot detect, but that chance is small. There could be another gene that has not yet been discovered, or that we have not yet tested, that is responsible for the cancer diagnoses in the family.  It is also possible there is a hereditary cause for the cancer in the family that Jared [name] did not inherit.  Therefore, it is important to remain in touch with cancer genetics in the future so that we can continue to offer Jared [name] the most up to date genetic testing.   ADDITIONAL GENETIC TESTING:  We discussed with Jared [name] that his genetic testing was fairly extensive.  If there are genes identified to increase cancer risk that can be analyzed in the future, we would be happy to discuss and coordinate this testing at that time.    CANCER SCREENING RECOMMENDATIONS:  Jared [name] test result is considered negative (normal).  This means that we have not identified a hereditary cause for his personal and family history of cancer at this time.   An individual's cancer risk and medical management are not determined by genetic test results alone. Overall cancer risk assessment incorporates additional factors, including personal medical history, family history, and any available genetic information that may result in a personalized plan for cancer prevention and surveillance. Therefore, it is recommended he continue to follow the cancer management and screening guidelines provided by his oncology and primary healthcare provider.  Based on the reported personal and family history, specific cancer screenings for Jared [name] and his family include:  Pancreatic Cancer Screening/Risk Reduction: Avoid smoking, heavy  alcohol use, and obesity. Ideally, pancreatic cancer screening should be performed in experienced centers utilizing a multidisciplinary approach under research conditions. Recommended screening could include annual endoscopic ultrasound and/or MRI of the pancreas starting at age 42 or 63 years younger than the earliest age diagnosis in the family. His daughter and siblings are all recommended to have pancreatic cancer screening.  RECOMMENDATIONS FOR FAMILY MEMBERS:   Since he did not inherit a mutation in a cancer predisposition gene included on this panel, his daughter could not have inherited a mutation from him in one of these genes. Other members of the family may still carry a pathogenic variant in one of these genes that Jared [name] did not inherit. Based on the family history, we recommend his siblings all have genetic counseling and testing.  We do not recommend familial testing for the SDHA variant of uncertain significance (VUS).  Jared [name] questions were answered to his satisfaction today. Our contact information was provided should additional questions or concerns arise. Thank you for the referral and allowing [place] to share in the care of your patient.   Lucille Passy, MS, [facility] Genetic Counselor [email or web address] (P) [phone]  The patient was seen for a total of 30 minutes in face-to-face genetic counseling.  The patient brought his wife. Drs. Lindi Adie and/or Burr Medico were available to discuss this case as needed.   _______________________________________________________________________ For Office Staff:  Number of people involved in session: 2 Was an Intern/ student involved with case: no

## 2022-10-18 ENCOUNTER — Telehealth: Payer: Self-pay | Admitting: *Deleted

## 2022-10-18 ENCOUNTER — Other Ambulatory Visit (HOSPITAL_COMMUNITY): Payer: Self-pay

## 2022-10-18 MED ORDER — FLUCONAZOLE 100 MG PO TABS: 100.0000 mg | ORAL_TABLET | Freq: Every day | ORAL | 0 refills | Status: DC

## 2022-10-18 NOTE — Telephone Encounter (Signed)
Per Dr. [name]: OK to send in 14 day supply of 100 mg fluconazole. This should complete his treatment for this issue. Jared [name] is aware.

## 2022-10-18 NOTE — Telephone Encounter (Signed)
Mr. Muckey is requesting refill on fluconazole 100 mg. Completed the 10 day course on 12/21 of 200 mg daily ordered when he was in the hospital. Reports he had significant esophagitis and was told by GI that he will need to take medication for extended period.  Next appointment is 11/02/22

## 2022-10-24 ENCOUNTER — Inpatient Hospital Stay: Payer: Medicare HMO | Admitting: Oncology

## 2022-10-24 ENCOUNTER — Inpatient Hospital Stay: Payer: Medicare HMO

## 2022-10-24 ENCOUNTER — Other Ambulatory Visit: Payer: Self-pay | Admitting: General Surgery

## 2022-10-24 DIAGNOSIS — C251 Malignant neoplasm of body of pancreas: Secondary | ICD-10-CM

## 2022-10-26 ENCOUNTER — Telehealth: Payer: Self-pay

## 2022-10-26 ENCOUNTER — Inpatient Hospital Stay: Payer: Medicare HMO

## 2022-10-26 ENCOUNTER — Encounter: Payer: Self-pay | Admitting: Family Medicine

## 2022-10-26 NOTE — Telephone Encounter (Signed)
Called patient and made NURSE visit appointment to receive first of three vaccines pre surgery.  Patient is to bring in orders with him.  According to him he will need Prevnar 75, Meningo, and Influenza.  Of note patient has had High Dose Influenza 07/24/2022.  Checking with surgeon to see if he will need another dose.  Patient does not want to get all vaccines at once and will make appropriate appointments.  Surgery is scheduled for mid January 2024.  Ozella Almond, [place]

## 2022-10-27 ENCOUNTER — Other Ambulatory Visit: Payer: Self-pay

## 2022-10-31 ENCOUNTER — Other Ambulatory Visit: Payer: Self-pay | Admitting: *Deleted

## 2022-10-31 DIAGNOSIS — Z299 Encounter for prophylactic measures, unspecified: Secondary | ICD-10-CM

## 2022-10-31 NOTE — Progress Notes (Signed)
Pre-splenectomy vaccine orders placed for 11/02/22

## 2022-10-31 NOTE — Telephone Encounter (Signed)
I am covering for Dr. Erin Hearing who is away from the office this week.  Reviewed patient's history and current recommendations for vaccination in advance of splenectomy.  Based on my reading, he should receive: - Hib - MenACWY (2 doses 8 weeks apart) - MenB (Bexsero 2 doses 4 weeks apart)  He does NOT need any further pneumococcal vaccines right now as he has previously had both PCV 13 and PSV 23.  Called patient to review recommendations with him in advance of his RN clinic appointment tomorrow. He requested I reach out to surgeon's office to confirm plans with them. I called Dr. Marlowe Aschoff office and discussed plan with her nursing staff.  They advise she does not routinely recommend the MenB vaccine prior to splenectomy, just the MenACWY.  Thus, the patient will receive: - Haemophilus influenza (Hib) vaccine - one dose - Meningococcal (MenACWY) vaccine - 2 doses given 8 weeks apart  He should be scheduled for his 8 week booster dose of MenACWY during tomorrow's visit.  Sent patient a msg with updated recommendations. Will route to RN team so they are aware of what he needs tomorrow.  Leeanne Rio, MD

## 2022-11-01 ENCOUNTER — Ambulatory Visit (INDEPENDENT_AMBULATORY_CARE_PROVIDER_SITE_OTHER): Payer: Medicare HMO

## 2022-11-01 ENCOUNTER — Telehealth: Payer: Self-pay | Admitting: *Deleted

## 2022-11-01 DIAGNOSIS — Z23 Encounter for immunization: Secondary | ICD-10-CM | POA: Diagnosis not present

## 2022-11-01 NOTE — Progress Notes (Signed)
Patient presents to nurse clinic for immunizations prior to surgery.    Immunizations given per PCP.  HIB and Meningitis  Patient scheduled for 12/27/2022 for #2 Meningitis.

## 2022-11-01 NOTE — Telephone Encounter (Signed)
Mr. Jared [name] called to report his PCP gave him the ActHIB and Bexsero vaccines today. Does not need the Tdap (given in 2015).  Canceled his vaccines for tomorrow. He will still see Dr. [name] as scheduled.

## 2022-11-02 ENCOUNTER — Other Ambulatory Visit: Payer: Self-pay

## 2022-11-02 ENCOUNTER — Inpatient Hospital Stay: Payer: Medicare HMO

## 2022-11-02 ENCOUNTER — Inpatient Hospital Stay: Payer: Medicare HMO | Attending: Oncology | Admitting: Oncology

## 2022-11-02 VITALS — BP 123/88 | HR 65 | Temp 98.1°F | Resp 20 | Ht 72.0 in | Wt 218.2 lb

## 2022-11-02 DIAGNOSIS — Z452 Encounter for adjustment and management of vascular access device: Secondary | ICD-10-CM | POA: Diagnosis not present

## 2022-11-02 DIAGNOSIS — C251 Malignant neoplasm of body of pancreas: Secondary | ICD-10-CM | POA: Diagnosis not present

## 2022-11-02 DIAGNOSIS — Z8546 Personal history of malignant neoplasm of prostate: Secondary | ICD-10-CM | POA: Diagnosis not present

## 2022-11-02 DIAGNOSIS — C258 Malignant neoplasm of overlapping sites of pancreas: Secondary | ICD-10-CM | POA: Diagnosis not present

## 2022-11-02 DIAGNOSIS — Z95828 Presence of other vascular implants and grafts: Secondary | ICD-10-CM

## 2022-11-02 MED ORDER — SODIUM CHLORIDE 0.9% FLUSH
10.0000 mL | INTRAVENOUS | Status: DC | PRN
  Administered 2022-11-02: 10 mL via INTRAVENOUS

## 2022-11-02 MED ORDER — HEPARIN SOD (PORK) LOCK FLUSH 100 UNIT/ML IV SOLN
500.0000 [IU] | Freq: Once | INTRAVENOUS | Status: AC
  Administered 2022-11-02: 500 [IU] via INTRAVENOUS

## 2022-11-02 NOTE — Progress Notes (Signed)
[facility] OFFICE PROGRESS NOTE   Diagnosis: Pancreas cancer  INTERVAL HISTORY:   Mr. Jared [name] returns as scheduled.  He reports the esophagitis pain has resolved.  He has intermittent abdominal pain.  He received pre-splenectomy vaccines via his primary provider.  He is scheduled for surgery on 11/27/2022.  Objective:  Vital signs in last 24 hours:  Blood pressure 123/88, pulse 65, temperature 98.1 F (36.7 C), temperature source Oral, resp. rate 20, height 6' (1.829 m), weight 218 lb 3.2 oz (99 kg), SpO2 100 %.     Lymphatics: No cervical, supraclavicular, axillary, or inguinal nodes Resp: Lungs clear bilaterally Cardio: Regular rate and rhythm GI: No mass, no hepatosplenomegaly Vascular: Lower leg is slightly larger than the right side.  No erythema or tenderness  Portacath/PICC-without erythema  Lab Results:  Lab Results  Component Value Date   WBC 10.2 10/07/2022   HGB 12.1 (L) 10/07/2022   HCT 36.5 (L) 10/07/2022   MCV 88.4 10/07/2022   PLT 181 10/07/2022   NEUTROABS 14.4 (H) 10/05/2022    CMP  Lab Results  Component Value Date   NA 141 10/16/2022   K 4.8 10/16/2022   CL 105 10/16/2022   CO2 21 10/16/2022   GLUCOSE 136 (H) 10/16/2022   BUN 8 10/16/2022   CREATININE 0.91 10/16/2022   CALCIUM 9.5 10/16/2022   PROT 5.3 (L) 10/03/2022   ALBUMIN 3.1 (L) 10/03/2022   AST 26 10/03/2022   ALT 30 10/03/2022   ALKPHOS 100 10/03/2022   BILITOT 1.2 10/03/2022   GFRNONAA >60 10/08/2022   GFRAA >90 08/10/2013    Lab Results  Component Value Date   DUK025 583 (H) 09/26/2022     Medications: I have reviewed the patient's current medications.   Assessment/Plan: Pancreas cancer FNA biopsy of a pancreas body/tail mass 07/05/2022-adenocarcinoma CT abdomen/pelvis 06/14/2022-hypoenhancing pancreas body/tail mass with effacement of the splenic vein, no evidence of lymphadenopathy or metastatic disease CA 19-9 on 06/18/2022-767 EUS 07/05/2022-a 7 x 29 mm  pancreas body/tail mass, abutment of the splenic artery, no malignant appearing lymph nodes, T2N0 by EUS CTs 07/15/2022-pancreas body/tail mass with no evidence of metastatic disease, splenic vein associated with the tumor, no arterial involvement, stable right greater than left lung nodules favored benign Cycle 1 FOLFOX 08/01/2022 Cycle 2 FOLFOX, 08/16/2022, neulasta Cycle 3 FOLFIRINOX 08/29/2022, Neulasta Cycle 4 FOLFIRINOX 09/12/2022, Neulasta, irinotecan and 5-fluorouracil dose reduced due to diarrhea/weight loss Cycle 5 FOLFIRINOX 09/26/2022 CT abdomen/pelvis 10/09/2022-no significant change in the appearance of the ill-defined pancreatic tail mass.  Persistent extrinsic mass effect on the splenic vein which remains patent.  Indeterminate left adrenal nodule. Prostate cancer, status post prostatectomy 1990 Chronic elevation of the PSA-maintained on Trelstar, followed by Dr. Jeffie Pollock   3.  Kidney stones 4.   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  Abdominal pain, likely secondary to #1 6.  Family history of prostate and pancreas cancer 7.  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  Typhlitis in 2014 9.  Colon polyps-tubular adenomas on colonoscopy 03/27/2022 10.  Presentation emergency room 10/01/2022 and 10/02/2022 with nausea/vomiting, diarrhea, and chest pain-esophagitis      Disposition: Mr. Harker appears stable.  He is scheduled undergo a distal pancreatectomy/splenectomy and left adrenalectomy on 11/27/2022.  We flushed the Port-A-Cath today.  We will see him a few weeks after surgery to discuss the indication for adjuvant therapy.  Betsy Coder, MD  11/02/2022  9:04 AM

## 2022-11-07 ENCOUNTER — Other Ambulatory Visit: Payer: Self-pay | Admitting: Nurse Practitioner

## 2022-11-07 ENCOUNTER — Other Ambulatory Visit: Payer: Self-pay | Admitting: Oncology

## 2022-11-07 DIAGNOSIS — C251 Malignant neoplasm of body of pancreas: Secondary | ICD-10-CM

## 2022-11-07 MED ORDER — HYDROMORPHONE HCL 4 MG PO TABS: 4.0000 mg | ORAL_TABLET | Freq: Four times a day (QID) | ORAL | 0 refills | Status: DC | PRN

## 2022-11-14 ENCOUNTER — Encounter: Payer: Self-pay | Admitting: Oncology

## 2022-11-15 ENCOUNTER — Telehealth: Payer: Self-pay | Admitting: *Deleted

## 2022-11-15 NOTE — Telephone Encounter (Signed)
Mr. Jared [name] reports pain not under control any longer with hydromorphone 4 mg Q 4 hours. Only takes ~ 4/day. Pain rated 5-6 in mid abdomen and after medication only reduces to ~ 4/10. Does not awaken him at night. Bowels are moving with daily prune juice and occasional Dulcolax and Colace. Per Dr. [name]: May increase to 1.5 to 2 tablets every 4 hours. Encourage him to take the bowel medications daily due to increase in hydromorphone dosing. He will call if pain not better controlled.

## 2022-11-16 ENCOUNTER — Other Ambulatory Visit: Payer: Self-pay | Admitting: Nurse Practitioner

## 2022-11-16 ENCOUNTER — Encounter: Payer: Self-pay | Admitting: Oncology

## 2022-11-16 ENCOUNTER — Other Ambulatory Visit: Payer: Self-pay | Admitting: *Deleted

## 2022-11-16 DIAGNOSIS — C251 Malignant neoplasm of body of pancreas: Secondary | ICD-10-CM

## 2022-11-16 MED ORDER — HYDROMORPHONE HCL 4 MG PO TABS: 4.0000 mg | ORAL_TABLET | ORAL | 0 refills | Status: DC | PRN

## 2022-11-17 ENCOUNTER — Other Ambulatory Visit: Payer: Self-pay | Admitting: Oncology

## 2022-11-19 ENCOUNTER — Encounter: Payer: Self-pay | Admitting: Oncology

## 2022-11-19 MED ORDER — FLUCONAZOLE 100 MG PO TABS: 100.0000 mg | ORAL_TABLET | Freq: Every day | ORAL | 0 refills | Status: DC

## 2022-11-19 NOTE — Progress Notes (Signed)
Called Jared [name] to inquire for reason for refill request on fluconazole. He reports his tongue is coated white again. MD approved 4 day supply.

## 2022-11-21 NOTE — Pre-Procedure Instructions (Signed)
Surgical Instructions    Your procedure is scheduled on Tuesday, January 30th.  Report to [facility] Main Entrance "A" at 07:00 A.M., then check in with the Admitting office.  Call this number if you have problems the morning of surgery:  [phone]  If you have any questions prior to your surgery date call [phone]: Open Monday-Friday 8am-4pm If you experience any cold or flu symptoms such as cough, fever, chills, shortness of breath, etc. between now and your scheduled surgery, please notify us at the above number.     Remember:  Do not eat after midnight the night before your surgery  You may drink clear liquids until 06:00 AM the morning of your surgery.   Clear liquids allowed are: Water, Non-Citrus Juices (without pulp), Carbonated Beverages, Clear Tea, Black Coffee Only (NO MILK, CREAM OR POWDERED CREAMER of any kind), and Gatorade.   Patient Instructions  The night before surgery:  No food after midnight. ONLY clear liquids after midnight  The day of surgery (if you do NOT have diabetes):  Drink ONE (1) Pre-Surgery Clear Ensure by 06:00 AM the morning of surgery. Drink in one sitting. Do not sip.  This drink was given to you during your hospital  pre-op appointment visit.  Nothing else to drink after completing the  Pre-Surgery Clear Ensure.          If you have questions, please contact your surgeon's office.     Take these medicines the morning of surgery with A SIP OF WATER  brimonidine (ALPHAGAN) eye drops Dorzolamide HCl-Timolol Mal PF 2-0.5 % SOLN eye drops fluconazole (DIFLUCAN)  GEMTESA  pantoprazole (PROTONIX)     If needed: acetaminophen (TYLENOL)  Carboxymethylcell-Glycerin PF (REFRESH OPTIVE PF) eye drops HYDROmorphone (DILAUDID)  olopatadine (PATANOL) eye drops ondansetron (ZOFRAN)  prochlorperazine (COMPAZINE)  sodium chloride (AYR) 0.65 % nasal spray    As of today, STOP taking any Aspirin (unless otherwise instructed by your surgeon)  Aleve, Naproxen, Ibuprofen, Motrin, Advil, Goody's, BC's, all herbal medications, fish oil, and all vitamins.                     Do NOT Smoke (Tobacco/Vaping) for 24 hours prior to your procedure.  If you use a CPAP at night, you may bring your mask/headgear for your overnight stay.   Contacts, glasses, piercing's, hearing aid's, dentures or partials may not be worn into surgery, please bring cases for these belongings.    For patients admitted to the hospital, discharge time will be determined by your treatment team.   Patients discharged the day of surgery will not be allowed to drive home, and someone needs to stay with them for 24 hours.  SURGICAL WAITING ROOM VISITATION Patients having surgery or a procedure may have no more than 2 support people in the waiting area - these visitors may rotate.   Children under the age of 81 must have an adult with them who is not the patient. If the patient needs to stay at the hospital during part of their recovery, the visitor guidelines for inpatient rooms apply. Pre-op nurse will coordinate an appropriate time for 1 support person to accompany patient in pre-op.  This support person may not rotate.   Please refer to the [facility] website for the visitor guidelines for Inpatients (after your surgery is over and you are in a regular room).    Special instructions:   [facility]- Preparing For Surgery  Before surgery, you can play an important  role. Because skin is not sterile, your skin needs to be as free of germs as possible. You can reduce the number of germs on your skin by washing with CHG (chlorahexidine gluconate) Soap before surgery.  CHG is an antiseptic cleaner which kills germs and bonds with the skin to continue killing germs even after washing.    Oral Hygiene is also important to reduce your risk of infection.  Remember - BRUSH YOUR TEETH THE MORNING OF SURGERY WITH YOUR REGULAR TOOTHPASTE  Please do not use if you have an allergy  to CHG or antibacterial soaps. If your skin becomes reddened/irritated stop using the CHG.  Do not shave (including legs and underarms) for at least 48 hours prior to first CHG shower. It is OK to shave your face.  Please follow these instructions carefully.   Shower the NIGHT BEFORE SURGERY and the MORNING OF SURGERY  If you chose to wash your hair, wash your hair first as usual with your normal shampoo.  After you shampoo, rinse your hair and body thoroughly to remove the shampoo.  Use CHG Soap as you would any other liquid soap. You can apply CHG directly to the skin and wash gently with a scrungie or a clean washcloth.   Apply the CHG Soap to your body ONLY FROM THE NECK DOWN.  Do not use on open wounds or open sores. Avoid contact with your eyes, ears, mouth and genitals (private parts). Wash Face and genitals (private parts)  with your normal soap.   Wash thoroughly, paying special attention to the area where your surgery will be performed.  Thoroughly rinse your body with warm water from the neck down.  DO NOT shower/wash with your normal soap after using and rinsing off the CHG Soap.  Pat yourself dry with a CLEAN TOWEL.  Wear CLEAN PAJAMAS to bed the night before surgery  Place CLEAN SHEETS on your bed the night before your surgery  DO NOT SLEEP WITH PETS.   Day of Surgery: Take a shower with CHG soap. Do not wear jewelry  Do not wear lotions, powders, perfumes, or deodorant. Do not shave 48 hours prior to surgery.   Do not bring valuables to the hospital. [facility] is not responsible for any belongings or valuables.  Wear Clean/Comfortable clothing the morning of surgery Remember to brush your teeth WITH YOUR REGULAR TOOTHPASTE.   Please read over the following fact sheets that you were given.    If you received a COVID test during your pre-op visit  it is requested that you wear a mask when out in public, stay away from anyone that may not be feeling well and  notify your surgeon if you develop symptoms. If you have been in contact with anyone that has tested positive in the last 10 days please notify you surgeon.

## 2022-11-22 ENCOUNTER — Other Ambulatory Visit: Payer: Self-pay

## 2022-11-22 ENCOUNTER — Encounter (HOSPITAL_COMMUNITY)
Admission: RE | Admit: 2022-11-22 | Discharge: 2022-11-22 | Disposition: A | Discharge status: Home / Self Care | Payer: Medicare HMO | Source: Ambulatory Visit | Attending: General Surgery | Admitting: General Surgery

## 2022-11-22 ENCOUNTER — Encounter (HOSPITAL_COMMUNITY): Payer: Self-pay

## 2022-11-22 DIAGNOSIS — C251 Malignant neoplasm of body of pancreas: Secondary | ICD-10-CM | POA: Insufficient documentation

## 2022-11-22 DIAGNOSIS — Z01812 Encounter for preprocedural laboratory examination: Secondary | ICD-10-CM | POA: Diagnosis not present

## 2022-11-22 LAB — CBC WITH DIFFERENTIAL/PLATELET
Abs Immature Granulocytes: 0.01 10*3/uL (ref 0.00–0.07)
Basophils Absolute: 0.1 10*3/uL (ref 0.0–0.1)
Basophils Relative: 1 %
Eosinophils Absolute: 0.1 10*3/uL (ref 0.0–0.5)
Eosinophils Relative: 2 %
HCT: 42.9 % (ref 39.0–52.0)
Hemoglobin: 13.7 g/dL (ref 13.0–17.0)
Immature Granulocytes: 0 %
Lymphocytes Relative: 15 %
Lymphs Abs: 0.8 10*3/uL (ref 0.7–4.0)
MCH: 29.6 pg (ref 26.0–34.0)
MCHC: 31.9 g/dL (ref 30.0–36.0)
MCV: 92.7 fL (ref 80.0–100.0)
Monocytes Absolute: 0.6 10*3/uL (ref 0.1–1.0)
Monocytes Relative: 11 %
Neutro Abs: 3.7 10*3/uL (ref 1.7–7.7)
Neutrophils Relative %: 71 %
Platelets: 152 10*3/uL (ref 150–400)
RBC: 4.63 MIL/uL (ref 4.22–5.81)
RDW: 14.3 % (ref 11.5–15.5)
WBC: 5.2 10*3/uL (ref 4.0–10.5)
nRBC: 0 % (ref 0.0–0.2)

## 2022-11-22 LAB — COMPREHENSIVE METABOLIC PANEL
ALT: 32 U/L (ref 0–44)
AST: 31 U/L (ref 15–41)
Albumin: 3.9 g/dL (ref 3.5–5.0)
Alkaline Phosphatase: 72 U/L (ref 38–126)
Anion gap: 9 (ref 5–15)
BUN: 10 mg/dL (ref 8–23)
CO2: 29 mmol/L (ref 22–32)
Calcium: 9.7 mg/dL (ref 8.9–10.3)
Chloride: 103 mmol/L (ref 98–111)
Creatinine, Ser: 0.79 mg/dL (ref 0.61–1.24)
GFR, Estimated: >60 mL/min (ref >60)
Glucose, Bld: 159 mg/dL — ABNORMAL HIGH (ref 70–99)
Potassium: 4.3 mmol/L (ref 3.5–5.1)
Sodium: 141 mmol/L (ref 135–145)
Total Bilirubin: 0.5 mg/dL (ref 0.3–1.2)
Total Protein: 6.5 g/dL (ref 6.5–8.1)

## 2022-11-22 LAB — PROTIME-INR
INR: 1 (ref 0.8–1.2)
Prothrombin Time: 13 seconds (ref 11.4–15.2)

## 2022-11-22 NOTE — Progress Notes (Signed)
PCP -  [facility]. [facility] Cardiologist -  Denies  PPM/ICD - Denies  Chest x-ray - Denies EKG - 10/02/2022 Stress Test - 2009-2010 ECHO - Denies Cardiac Cath - Denies  Sleep Study - Denies  DM: Denies  Blood Thinner Instructions: N/A Aspirin Instructions:N/A  ERAS Protcol - Yes PRE-SURGERY Ensure or G2-  Ensure  COVID TEST- N/A   Anesthesia review: No  Patient denies shortness of breath, fever, cough and chest pain at PAT appointment   All instructions explained to the patient, with a verbal understanding of the material. Patient agrees to go over the instructions while at home for a better understanding. The opportunity to ask questions was provided.

## 2022-11-26 NOTE — H&P (Signed)
PROVIDER:  Georgianne Fick, MD Patient Care Team: Tiburcio Pea, MD as PCP - General (Family Medicine)   MRN: [id number] DOB: 12/30/1945    Chief Complaint: Discuss Pancreatic surgery       History of Present Illness: Jared [name] is a 77 y.o. male who is seen today for follow up of pancreatic cancer.   Initial history:     Patient is a 77 year old male who presented with mid to upper abdominal discomfort at the end of 2022.  He was undergoing a series of dental implants and he thought that was attributable to that as well as the anxiety.  However, he completed this treatment and continued to have nominal pain.  He was almost due for his colonoscopy so he had that and that showed a few polyps, but no lesions.  He subsequently had an upper and endoscopy which was unrevealing.  At that point a CT scan was performed and this showed a distal pancreatic mass.  He then had an endoscopic ultrasound confirming this.  The EUS showed a mass with approximately 3.7 cm in the pancreatic body/tail.  There was abutment of the splenic artery on ultrasound.  There was some atrophy and some downstream pancreatic ductal dilation.  Incidentally, he also had 2 stones in the common bile duct with some dilation of the common bile duct and common hepatic duct.  No lymph nodes are seen.  Endoscopic staging was UT2N0.  The patient has not had any significant weight loss.  The patient has only needed occasional ibuprofen for the abdominal pain, but it is kind of a nagging discomfort that is always present.   Incidentally, I did a laparoscopic cholecystectomy on Jared [name] in 2014 at [facility].  Dr. Dalbert Batman did a right inguinal hernia repair in 2016.   Patient has a personal history of prostate cancer.  His father and 4 brothers also had prostate cancer.  His mother had pancreas cancer and a sister had some sort of metastatic cancer that involved the liver   Interval history:    He has been undergoing  neoadjuvant chemotherapy (FOLFIRINOX) with Dr. [name]. He has received 5 cycles.  He had to be admitted 12/5-12/11 for chest pain and was felt to have esophagitis.  Otherwise he has tolerated chemotherapy.    He had follow up imaging that was negative for mets or enlargement of the lesion.  He continues to have mass effect on the splenic vein from the mass. He has a left adrenal nodule that was previously PET negative, but does appear to be very slightly enlarged compared to prior studies.  Ca 19-9 has come down to 583, down from a high of 1171.    He feels well and is eager to proceed with surgery.   Ct panc protocol 10/09/2022  IMPRESSION: 1. 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 There is persistent extrinsic mass effect on the splenic vein which remains patent. 2. No definite evidence of abdominal metastatic disease or vascular encasement. 3. Indeterminate left adrenal nodule, mildly enlarged and increasingly heterogeneous from older prior studies. The possibility of a collision lesion (metastasis within a pre-existing adenoma) cannot be excluded by this examination. If it would affect the patient's management, consider follow-up PET-CT. 4. Nonobstructing bilateral renal calculi. 5.  Aortic Atherosclerosis (ICD10-I70.0).   Review of Systems: A complete review of systems was obtained from the patient.  I have reviewed this information and discussed as appropriate with  the patient.  See HPI as well for other ROS.   Review of Systems  All other systems reviewed and are negative.       Medical History: Past Medical History      Past Medical History:  Diagnosis Date   Arthritis     Chronic kidney disease     GERD (gastroesophageal reflux disease)     History of cancer             Patient Active Problem List  Diagnosis   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CMS-HCC)   Family  history of cancer   H/O prostate cancer   Elevated CA 19-9 level   Choledocholithiasis      Past Surgical History       Past Surgical History:  Procedure Laterality Date   CHOLECYSTECTOMY       COLON SURGERY       HERNIA REPAIR       PROSTATECTOMY PERINEAL       TONSILLECTOMY            Allergies       Allergies  Allergen Reactions   Loratadine Other (See Comments)      Increases pressure in eyes   Leuprolide Itching              Current Outpatient Medications on File Prior to Visit  Medication Sig Dispense Refill   pantoprazole (PROTONIX) 40 MG DR tablet Take 1 tablet by mouth 2 (two) times daily       potassium chloride (KLOR-CON) 20 MEQ ER tablet Take 1 tablet by mouth once daily       prochlorperazine (COMPAZINE) 10 MG tablet Take 10 mg by mouth every 6 (six) hours as needed       sucralfate (CARAFATE) 1 gram tablet Take by mouth       ascorbic acid, vitamin C, (VITAMIN C) 500 MG tablet Take by mouth       brimonidine (ALPHAGAN P) 0.15 % ophthalmic solution Apply to eye       carboxymethylcellulose (REFRESH TEARS) 0.5 % ophthalmic solution Place 1-2 drops into both eyes as needed for Dry Eyes       dorzolamide-timolol (COSOPT PF) 2-0.5 % ophthalmic solution Apply 1 drop to eye 2 (two) times daily       garlic 9,892 mg Cap Take by mouth       GEMTESA 75 mg Tab Take by mouth       sodium chloride (AYR) 0.65 % nasal drops Place into one nostril       triptorelin pamoate (TRELSTAR) 11.25 mg IM suspension Inject into the muscle        No current facility-administered medications on file prior to visit.      Family History       Family History  Problem Relation Age of Onset   Diabetes Sister          Social History        Tobacco Use  Smoking Status Former   Packs/day: .5   Types: Cigarettes   Quit date: 10/29/1981   Years since quitting: 41.0  Smokeless Tobacco Never      Social History  Social History         Socioeconomic History   Marital  status: Married  Tobacco Use   Smoking status: Former      Packs/day: .5      Types: Cigarettes      Quit date: 10/29/1981  Years since quitting: 41.0   Smokeless tobacco: Never  Substance and Sexual Activity   Alcohol use: Not Currently   Drug use: Never        Objective:      There were no vitals filed for this visit.  There is no height or weight on file to calculate BMI.   Head:   Normocephalic and atraumatic.  Eyes:    Conjunctivae are normal. Pupils are equal, round, and reactive to light. No scleral icterus.  Neck:   Normal range of motion. Neck supple. No tracheal deviation present. No thyromegaly present.  Resp:   No respiratory distress, normal effort. Abd:      Abdomen is soft, non distended and non tender. No masses are palpable.  There is no rebound and no guarding.  Neurological: Alert and oriented to person, place, and time. Coordination normal.  Skin:    Skin is warm and dry. No rash noted. No diaphoretic. No erythema. No pallor.  Psychiatric: Normal mood and affect. Normal behavior. Judgment and thought content normal.      Labs, Imaging and Diagnostic Testing:   See above     Assessment and Plan:     Diagnoses and all orders for this visit:   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CMS-HCC)   Family history of cancer   Elevated CA 19-9 level   Patient is ready for surgery.  I discussed with him and his family a diagnostic laparoscopy, open distal pancreatectomy with splenectomy and possible left adrenalectomy.  The left adrenal nodule was not hot on his initial PET scan, but his most recent imaging shows a slight enlargement.  If it at all feels unusual or is in very close proximity to the cancer, I will plan to remove the left adrenal gland.   I discussed the specifics of the surgery.  I discussed epidural.  I discussed the risks of surgery including bleeding, infection, damage to adjacent structures, heart or lung issues, blood clot, death.  I  discussed the risk of pancreatic leak and that it could make him extremely sick.  I discussed that he may have prolonged drains and may need an additional drain placed in radiology.   I discussed time in the hospital is most likely 3 to 6 days.  I reviewed postoperative recovery and had a septations as well as restrictions.  I discussed the risk of developing diabetes or pancreatic exocrine insufficiency and what would have to be done in those cases.  He understands and wishes to proceed with the first available opportunity.  Epic orders have been written.  Plan vaccines to oncology to see if can be given at the cancer center.   Advised the patient if they can get the vaccines at the cancer center that he will need to go to the health department.

## 2022-11-26 NOTE — Anesthesia Preprocedure Evaluation (Signed)
Anesthesia Evaluation  Patient identified by MRN, date of birth, ID band Patient awake    Reviewed: Allergy & Precautions, NPO status , Patient's Chart, lab work & pertinent test results  History of Anesthesia Complications Negative for: history of anesthetic complications  Airway Mallampati: I  TM Distance: >3 FB Neck ROM: Full    Dental  (+) Caps, Dental Advisory Given   Pulmonary former smoker   breath sounds clear to auscultation       Cardiovascular negative cardio ROS  Rhythm:Regular Rate:Normal     Neuro/Psych glaucoma    GI/Hepatic Neg liver ROS,GERD  Medicated and Controlled,,Pancreatic cancer   Endo/Other  negative endocrine ROS    Renal/GU H/o stones   H/o prostate cancer    Musculoskeletal   Abdominal   Peds  Hematology S/p chemo   Anesthesia Other Findings   Reproductive/Obstetrics                              Anesthesia Physical Anesthesia Plan  ASA: 3  Anesthesia Plan: General   Post-op Pain Management: Epidural* and Tylenol PO (pre-op)*   Induction: Intravenous  PONV Risk Score and Plan: 2 and Ondansetron and Dexamethasone  Airway Management Planned: Oral ETT  Additional Equipment: Arterial line  Intra-op Plan:   Post-operative Plan: Extubation in OR  Informed Consent: I have reviewed the patients History and Physical, chart, labs and discussed the procedure including the risks, benefits and alternatives for the proposed anesthesia with the patient or authorized representative who has indicated his/her understanding and acceptance.     Dental advisory given  Plan Discussed with: CRNA and Surgeon  Anesthesia Plan Comments:         Anesthesia Quick Evaluation

## 2022-11-27 ENCOUNTER — Inpatient Hospital Stay (HOSPITAL_COMMUNITY): Payer: Medicare HMO | Admitting: Anesthesiology

## 2022-11-27 ENCOUNTER — Other Ambulatory Visit: Payer: Self-pay

## 2022-11-27 ENCOUNTER — Encounter (HOSPITAL_COMMUNITY)
Admission: RE | Disposition: A | Discharge status: Home / Self Care | Payer: Self-pay | Source: Ambulatory Visit | Attending: General Surgery

## 2022-11-27 ENCOUNTER — Inpatient Hospital Stay (HOSPITAL_COMMUNITY)
Admission: RE | Admit: 2022-11-27 | Discharge: 2022-12-03 | DRG: 421 | Disposition: A | Discharge status: Home / Self Care | Payer: Medicare HMO | Source: Ambulatory Visit | Attending: General Surgery | Admitting: General Surgery

## 2022-11-27 ENCOUNTER — Encounter (HOSPITAL_COMMUNITY): Payer: Self-pay | Admitting: General Surgery

## 2022-11-27 DIAGNOSIS — I700 Atherosclerosis of aorta: Secondary | ICD-10-CM | POA: Diagnosis present

## 2022-11-27 DIAGNOSIS — Z79899 Other long term (current) drug therapy: Secondary | ICD-10-CM | POA: Diagnosis not present

## 2022-11-27 DIAGNOSIS — Z833 Family history of diabetes mellitus: Secondary | ICD-10-CM

## 2022-11-27 DIAGNOSIS — C252 Malignant neoplasm of tail of pancreas: Principal | ICD-10-CM | POA: Diagnosis present

## 2022-11-27 DIAGNOSIS — F419 Anxiety disorder, unspecified: Secondary | ICD-10-CM | POA: Diagnosis present

## 2022-11-27 DIAGNOSIS — M1990 Unspecified osteoarthritis, unspecified site: Secondary | ICD-10-CM | POA: Diagnosis present

## 2022-11-27 DIAGNOSIS — Z87891 Personal history of nicotine dependence: Secondary | ICD-10-CM

## 2022-11-27 DIAGNOSIS — K219 Gastro-esophageal reflux disease without esophagitis: Secondary | ICD-10-CM | POA: Diagnosis present

## 2022-11-27 DIAGNOSIS — Z9221 Personal history of antineoplastic chemotherapy: Secondary | ICD-10-CM | POA: Diagnosis not present

## 2022-11-27 DIAGNOSIS — C250 Malignant neoplasm of head of pancreas: Secondary | ICD-10-CM

## 2022-11-27 DIAGNOSIS — Z808 Family history of malignant neoplasm of other organs or systems: Secondary | ICD-10-CM

## 2022-11-27 DIAGNOSIS — Z8546 Personal history of malignant neoplasm of prostate: Secondary | ICD-10-CM

## 2022-11-27 DIAGNOSIS — H409 Unspecified glaucoma: Secondary | ICD-10-CM | POA: Diagnosis present

## 2022-11-27 DIAGNOSIS — E785 Hyperlipidemia, unspecified: Secondary | ICD-10-CM | POA: Diagnosis present

## 2022-11-27 DIAGNOSIS — Z800 Family history of malignant neoplasm of digestive organs: Secondary | ICD-10-CM | POA: Diagnosis not present

## 2022-11-27 DIAGNOSIS — Z888 Allergy status to other drugs, medicaments and biological substances status: Secondary | ICD-10-CM | POA: Diagnosis not present

## 2022-11-27 DIAGNOSIS — Z8051 Family history of malignant neoplasm of kidney: Secondary | ICD-10-CM | POA: Diagnosis not present

## 2022-11-27 DIAGNOSIS — C786 Secondary malignant neoplasm of retroperitoneum and peritoneum: Secondary | ICD-10-CM | POA: Diagnosis present

## 2022-11-27 DIAGNOSIS — Z8042 Family history of malignant neoplasm of prostate: Secondary | ICD-10-CM

## 2022-11-27 DIAGNOSIS — C251 Malignant neoplasm of body of pancreas: Secondary | ICD-10-CM

## 2022-11-27 HISTORY — PX: LAPAROSCOPY: SHX197

## 2022-11-27 HISTORY — PX: OPERATIVE ULTRASOUND: SHX5996

## 2022-11-27 HISTORY — PX: LAPAROTOMY: SHX154

## 2022-11-27 LAB — GLUCOSE, CAPILLARY: Glucose-Capillary: 155 mg/dL — ABNORMAL HIGH (ref 70–99)

## 2022-11-27 LAB — ABO/RH: ABO/RH(D): O POS

## 2022-11-27 LAB — HEMOGLOBIN A1C
Hgb A1c MFr Bld: 5.6 % (ref 4.8–5.6)
Mean Plasma Glucose: 114.02 mg/dL

## 2022-11-27 LAB — PREPARE RBC (CROSSMATCH)

## 2022-11-27 SURGERY — LAPAROSCOPY, DIAGNOSTIC
Anesthesia: General | Site: Abdomen

## 2022-11-27 MED ORDER — SUCRALFATE 1 G PO TABS
1.0000 g | ORAL_TABLET | Freq: Four times a day (QID) | ORAL | Status: DC
  Administered 2022-11-27 – 2022-12-03 (×21): 1 g via ORAL
  Filled 2022-11-27 (×22): qty 1

## 2022-11-27 MED ORDER — ACETAMINOPHEN 325 MG PO TABS: 325.0000 mg | ORAL_TABLET | Freq: Every day | ORAL | Status: DC | PRN

## 2022-11-27 MED ORDER — KETAMINE HCL 50 MG/5ML IJ SOSY
PREFILLED_SYRINGE | INTRAMUSCULAR | Status: AC
  Filled 2022-11-27: qty 5

## 2022-11-27 MED ORDER — ARTIFICIAL TEARS OPHTHALMIC OINT
TOPICAL_OINTMENT | OPHTHALMIC | Status: DC | PRN
  Administered 2022-11-27: 1 via OPHTHALMIC

## 2022-11-27 MED ORDER — VITAMIN C 500 MG PO TABS
500.0000 mg | ORAL_TABLET | Freq: Every day | ORAL | Status: DC
  Administered 2022-11-28 – 2022-12-03 (×3): 500 mg via ORAL
  Filled 2022-11-27 (×5): qty 1

## 2022-11-27 MED ORDER — LACTATED RINGERS IV SOLN: INTRAVENOUS | Status: DC | PRN

## 2022-11-27 MED ORDER — PROCHLORPERAZINE EDISYLATE 10 MG/2ML IJ SOLN: 5.0000 mg | Freq: Four times a day (QID) | INTRAMUSCULAR | Status: DC | PRN

## 2022-11-27 MED ORDER — HYDROMORPHONE HCL 1 MG/ML IJ SOLN
0.2500 mg | INTRAMUSCULAR | Status: DC | PRN
  Administered 2022-11-27 (×4): 0.5 mg via INTRAVENOUS

## 2022-11-27 MED ORDER — OLOPATADINE HCL 0.1 % OP SOLN: 1.0000 [drp] | OPHTHALMIC | Status: DC | PRN

## 2022-11-27 MED ORDER — ACETAMINOPHEN 500 MG PO TABS: 1000.0000 mg | ORAL_TABLET | ORAL | Status: DC

## 2022-11-27 MED ORDER — CALCIUM CARBONATE ANTACID 500 MG PO CHEW: 3000.0000 mg | CHEWABLE_TABLET | Freq: Every day | ORAL | Status: DC

## 2022-11-27 MED ORDER — TRAMADOL HCL 50 MG PO TABS
50.0000 mg | ORAL_TABLET | Freq: Four times a day (QID) | ORAL | Status: DC | PRN
  Administered 2022-11-28 – 2022-12-02 (×3): 50 mg via ORAL
  Filled 2022-11-27 (×4): qty 1

## 2022-11-27 MED ORDER — OXYCODONE HCL 5 MG/5ML PO SOLN: 5.0000 mg | Freq: Once | ORAL | Status: DC | PRN

## 2022-11-27 MED ORDER — EPHEDRINE SULFATE-NACL 50-0.9 MG/10ML-% IV SOSY
PREFILLED_SYRINGE | INTRAVENOUS | Status: DC | PRN
  Administered 2022-11-27 (×3): 5 mg via INTRAVENOUS

## 2022-11-27 MED ORDER — GABAPENTIN 100 MG PO CAPS
100.0000 mg | ORAL_CAPSULE | Freq: Two times a day (BID) | ORAL | Status: DC
  Administered 2022-11-27 – 2022-12-03 (×11): 100 mg via ORAL
  Filled 2022-11-27 (×12): qty 1

## 2022-11-27 MED ORDER — CALCIUM CARBONATE ANTACID 500 MG PO CHEW
2000.0000 mg | CHEWABLE_TABLET | Freq: Every day | ORAL | Status: DC
  Filled 2022-11-27 (×2): qty 10

## 2022-11-27 MED ORDER — 0.9 % SODIUM CHLORIDE (POUR BTL) OPTIME
TOPICAL | Status: DC | PRN
  Administered 2022-11-27: 1000 mL

## 2022-11-27 MED ORDER — POTASSIUM CHLORIDE IN NACL 20-0.45 MEQ/L-% IV SOLN
INTRAVENOUS | Status: DC
  Filled 2022-11-27 (×2): qty 1000

## 2022-11-27 MED ORDER — STERILE WATER FOR IRRIGATION IR SOLN
Status: DC | PRN
  Administered 2022-11-27: 1000 mL

## 2022-11-27 MED ORDER — ONDANSETRON HCL 4 MG/2ML IJ SOLN
INTRAMUSCULAR | Status: AC
  Filled 2022-11-27: qty 2

## 2022-11-27 MED ORDER — FENTANYL CITRATE (PF) 250 MCG/5ML IJ SOLN
INTRAMUSCULAR | Status: AC
  Filled 2022-11-27: qty 5

## 2022-11-27 MED ORDER — ONDANSETRON HCL 4 MG PO TABS: 8.0000 mg | ORAL_TABLET | Freq: Three times a day (TID) | ORAL | Status: DC | PRN

## 2022-11-27 MED ORDER — LIDOCAINE 2% (20 MG/ML) 5 ML SYRINGE
INTRAMUSCULAR | Status: AC
  Filled 2022-11-27: qty 5

## 2022-11-27 MED ORDER — HYDROMORPHONE HCL 2 MG PO TABS
4.0000 mg | ORAL_TABLET | ORAL | Status: DC | PRN
  Administered 2022-11-27 (×2): 4 mg via ORAL
  Administered 2022-11-27: 8 mg via ORAL
  Administered 2022-12-02: 4 mg via ORAL
  Administered 2022-12-02 (×3): 8 mg via ORAL
  Administered 2022-12-03 (×2): 4 mg via ORAL
  Filled 2022-11-27 (×2): qty 2
  Filled 2022-11-27: qty 4
  Filled 2022-11-27: qty 2
  Filled 2022-11-27 (×3): qty 4
  Filled 2022-11-27: qty 2

## 2022-11-27 MED ORDER — PROMETHAZINE HCL 25 MG/ML IJ SOLN: 6.2500 mg | INTRAMUSCULAR | Status: DC | PRN

## 2022-11-27 MED ORDER — LACTATED RINGERS IV SOLN: INTRAVENOUS | Status: DC

## 2022-11-27 MED ORDER — DORZOLAMIDE HCL-TIMOLOL MAL PF 2-0.5 % OP SOLN
1.0000 [drp] | Freq: Two times a day (BID) | OPHTHALMIC | Status: DC
  Administered 2022-11-27: 1 [drp] via OPHTHALMIC
  Filled 2022-11-27: qty 1

## 2022-11-27 MED ORDER — LIDOCAINE-EPINEPHRINE 1 %-1:100000 IJ SOLN
INTRAMUSCULAR | Status: AC
  Filled 2022-11-27: qty 1

## 2022-11-27 MED ORDER — MIDAZOLAM HCL 2 MG/2ML IJ SOLN
INTRAMUSCULAR | Status: AC
  Administered 2022-11-27: 1 mg via INTRAVENOUS
  Filled 2022-11-27: qty 2

## 2022-11-27 MED ORDER — ROCURONIUM BROMIDE 10 MG/ML (PF) SYRINGE
PREFILLED_SYRINGE | INTRAVENOUS | Status: AC
  Filled 2022-11-27: qty 10

## 2022-11-27 MED ORDER — SALINE SPRAY 0.65 % NA SOLN: 2.0000 | NASAL | Status: DC | PRN

## 2022-11-27 MED ORDER — INSULIN ASPART 100 UNIT/ML IJ SOLN
3.0000 [IU] | Freq: Three times a day (TID) | INTRAMUSCULAR | Status: DC
  Administered 2022-11-28 – 2022-12-03 (×7): 3 [IU] via SUBCUTANEOUS

## 2022-11-27 MED ORDER — CEFAZOLIN SODIUM-DEXTROSE 2-4 GM/100ML-% IV SOLN
2.0000 g | INTRAVENOUS | Status: AC
  Administered 2022-11-27: 2 g via INTRAVENOUS
  Filled 2022-11-27: qty 100

## 2022-11-27 MED ORDER — ORAL CARE MOUTH RINSE: 15.0000 mL | Freq: Once | OROMUCOSAL | Status: AC

## 2022-11-27 MED ORDER — BUPIVACAINE HCL (PF) 0.25 % IJ SOLN
INTRAMUSCULAR | Status: AC
  Filled 2022-11-27: qty 30

## 2022-11-27 MED ORDER — FENTANYL CITRATE (PF) 100 MCG/2ML IJ SOLN
INTRAMUSCULAR | Status: AC
  Administered 2022-11-27: 50 ug via INTRAVENOUS
  Filled 2022-11-27: qty 2

## 2022-11-27 MED ORDER — HYDROMORPHONE HCL 1 MG/ML IJ SOLN
INTRAMUSCULAR | Status: AC
  Administered 2022-11-27: 1 mg via INTRAVENOUS
  Filled 2022-11-27: qty 1

## 2022-11-27 MED ORDER — ONDANSETRON HCL 4 MG/2ML IJ SOLN
INTRAMUSCULAR | Status: DC | PRN
  Administered 2022-11-27: 4 mg via INTRAVENOUS

## 2022-11-27 MED ORDER — ARTIFICIAL TEARS OPHTHALMIC OINT
TOPICAL_OINTMENT | OPHTHALMIC | Status: AC
  Filled 2022-11-27: qty 3.5

## 2022-11-27 MED ORDER — ROCURONIUM BROMIDE 10 MG/ML (PF) SYRINGE
PREFILLED_SYRINGE | INTRAVENOUS | Status: DC | PRN
  Administered 2022-11-27: 60 mg via INTRAVENOUS

## 2022-11-27 MED ORDER — PROPOFOL 10 MG/ML IV BOLUS
INTRAVENOUS | Status: DC | PRN
  Administered 2022-11-27: 100 mg via INTRAVENOUS

## 2022-11-27 MED ORDER — HYDROMORPHONE HCL 1 MG/ML IJ SOLN
INTRAMUSCULAR | Status: AC
  Filled 2022-11-27: qty 1

## 2022-11-27 MED ORDER — OXYCODONE HCL 5 MG PO TABS: 5.0000 mg | ORAL_TABLET | Freq: Once | ORAL | Status: DC | PRN

## 2022-11-27 MED ORDER — SUGAMMADEX SODIUM 200 MG/2ML IV SOLN
INTRAVENOUS | Status: DC | PRN
  Administered 2022-11-27: 209.6 mg via INTRAVENOUS

## 2022-11-27 MED ORDER — INSULIN ASPART 100 UNIT/ML IJ SOLN
0.0000 [IU] | Freq: Three times a day (TID) | INTRAMUSCULAR | Status: DC
  Administered 2022-12-01 – 2022-12-02 (×3): 1 [IU] via SUBCUTANEOUS

## 2022-11-27 MED ORDER — PROCHLORPERAZINE MALEATE 10 MG PO TABS: 10.0000 mg | ORAL_TABLET | Freq: Four times a day (QID) | ORAL | Status: DC | PRN

## 2022-11-27 MED ORDER — ENOXAPARIN SODIUM 40 MG/0.4ML IJ SOSY
40.0000 mg | PREFILLED_SYRINGE | INTRAMUSCULAR | Status: DC
  Administered 2022-11-28 – 2022-12-03 (×6): 40 mg via SUBCUTANEOUS
  Filled 2022-11-27 (×6): qty 0.4

## 2022-11-27 MED ORDER — VITAMIN D 25 MCG (1000 UNIT) PO TABS
1000.0000 [IU] | ORAL_TABLET | Freq: Every day | ORAL | Status: DC
  Administered 2022-11-28 – 2022-12-03 (×4): 1000 [IU] via ORAL
  Filled 2022-11-27 (×6): qty 1

## 2022-11-27 MED ORDER — HYDROMORPHONE HCL 1 MG/ML IJ SOLN
1.0000 mg | INTRAMUSCULAR | Status: DC | PRN
  Administered 2022-11-27 – 2022-12-01 (×17): 2 mg via INTRAVENOUS
  Administered 2022-12-02: 1 mg via INTRAVENOUS
  Filled 2022-11-27: qty 1
  Filled 2022-11-27 (×19): qty 2

## 2022-11-27 MED ORDER — ONDANSETRON HCL 4 MG/2ML IJ SOLN
4.0000 mg | Freq: Four times a day (QID) | INTRAMUSCULAR | Status: DC | PRN
  Administered 2022-11-27: 4 mg via INTRAVENOUS
  Filled 2022-11-27: qty 2

## 2022-11-27 MED ORDER — PHENYLEPHRINE 80 MCG/ML (10ML) SYRINGE FOR IV PUSH (FOR BLOOD PRESSURE SUPPORT)
PREFILLED_SYRINGE | INTRAVENOUS | Status: AC
  Filled 2022-11-27: qty 10

## 2022-11-27 MED ORDER — DIPHENHYDRAMINE HCL 50 MG/ML IJ SOLN
12.5000 mg | Freq: Four times a day (QID) | INTRAMUSCULAR | Status: DC | PRN
  Administered 2022-11-27: 12.5 mg via INTRAVENOUS
  Filled 2022-11-27: qty 1

## 2022-11-27 MED ORDER — CHLORHEXIDINE GLUCONATE CLOTH 2 % EX PADS: 6.0000 | MEDICATED_PAD | Freq: Once | CUTANEOUS | Status: DC

## 2022-11-27 MED ORDER — DIPHENHYDRAMINE HCL 12.5 MG/5ML PO ELIX: 12.5000 mg | ORAL_SOLUTION | Freq: Four times a day (QID) | ORAL | Status: DC | PRN

## 2022-11-27 MED ORDER — FENTANYL CITRATE (PF) 250 MCG/5ML IJ SOLN
INTRAMUSCULAR | Status: DC | PRN
  Administered 2022-11-27: 100 ug via INTRAVENOUS

## 2022-11-27 MED ORDER — POTASSIUM CHLORIDE CRYS ER 20 MEQ PO TBCR
20.0000 meq | EXTENDED_RELEASE_TABLET | Freq: Every day | ORAL | Status: DC
  Administered 2022-11-28 – 2022-12-03 (×4): 20 meq via ORAL
  Filled 2022-11-27 (×5): qty 1

## 2022-11-27 MED ORDER — HYDROMORPHONE HCL 2 MG PO TABS
ORAL_TABLET | ORAL | Status: AC
  Filled 2022-11-27: qty 2

## 2022-11-27 MED ORDER — ACETAMINOPHEN 500 MG PO TABS
1000.0000 mg | ORAL_TABLET | Freq: Once | ORAL | Status: AC
  Administered 2022-11-27: 1000 mg via ORAL
  Filled 2022-11-27: qty 2

## 2022-11-27 MED ORDER — POLYVINYL ALCOHOL 1.4 % OP SOLN: 1.0000 [drp] | OPHTHALMIC | Status: DC | PRN

## 2022-11-27 MED ORDER — METHOCARBAMOL 500 MG PO TABS
500.0000 mg | ORAL_TABLET | Freq: Four times a day (QID) | ORAL | Status: DC | PRN
  Administered 2022-11-27 – 2022-12-02 (×4): 500 mg via ORAL
  Filled 2022-11-27 (×4): qty 1

## 2022-11-27 MED ORDER — MELATONIN 3 MG PO TABS
3.0000 mg | ORAL_TABLET | Freq: Every evening | ORAL | Status: DC | PRN
  Administered 2022-11-27 – 2022-12-02 (×3): 3 mg via ORAL
  Filled 2022-11-27 (×4): qty 1

## 2022-11-27 MED ORDER — PANTOPRAZOLE SODIUM 40 MG PO TBEC
40.0000 mg | DELAYED_RELEASE_TABLET | Freq: Two times a day (BID) | ORAL | Status: DC
  Administered 2022-11-27 – 2022-12-03 (×10): 40 mg via ORAL
  Filled 2022-11-27 (×12): qty 1

## 2022-11-27 MED ORDER — DEXAMETHASONE SODIUM PHOSPHATE 10 MG/ML IJ SOLN
INTRAMUSCULAR | Status: DC | PRN
  Administered 2022-11-27: 10 mg via INTRAVENOUS

## 2022-11-27 MED ORDER — DEXAMETHASONE SODIUM PHOSPHATE 10 MG/ML IJ SOLN
INTRAMUSCULAR | Status: AC
  Filled 2022-11-27: qty 1

## 2022-11-27 MED ORDER — MIDAZOLAM HCL 2 MG/2ML IJ SOLN: 1.0000 mg | Freq: Once | INTRAMUSCULAR | Status: AC

## 2022-11-27 MED ORDER — DOCUSATE SODIUM 100 MG PO CAPS
100.0000 mg | ORAL_CAPSULE | Freq: Two times a day (BID) | ORAL | Status: DC
  Administered 2022-11-27 – 2022-12-03 (×11): 100 mg via ORAL
  Filled 2022-11-27 (×12): qty 1

## 2022-11-27 MED ORDER — FENTANYL CITRATE (PF) 100 MCG/2ML IJ SOLN: 50.0000 ug | Freq: Once | INTRAMUSCULAR | Status: AC

## 2022-11-27 MED ORDER — PROPOFOL 10 MG/ML IV BOLUS
INTRAVENOUS | Status: AC
  Filled 2022-11-27: qty 20

## 2022-11-27 MED ORDER — MIDAZOLAM HCL 2 MG/2ML IJ SOLN: 0.5000 mg | Freq: Once | INTRAMUSCULAR | Status: DC | PRN

## 2022-11-27 MED ORDER — EPHEDRINE 5 MG/ML INJ
INTRAVENOUS | Status: AC
  Filled 2022-11-27: qty 5

## 2022-11-27 MED ORDER — PHENYLEPHRINE HCL-NACL 20-0.9 MG/250ML-% IV SOLN
INTRAVENOUS | Status: DC | PRN
  Administered 2022-11-27: 30 ug/min via INTRAVENOUS

## 2022-11-27 MED ORDER — ACETAMINOPHEN 500 MG PO TABS
1000.0000 mg | ORAL_TABLET | Freq: Four times a day (QID) | ORAL | Status: DC
  Administered 2022-11-27 – 2022-12-03 (×21): 1000 mg via ORAL
  Filled 2022-11-27 (×20): qty 2

## 2022-11-27 MED ORDER — CEFAZOLIN SODIUM-DEXTROSE 2-4 GM/100ML-% IV SOLN
2.0000 g | Freq: Three times a day (TID) | INTRAVENOUS | Status: AC
  Administered 2022-11-27: 2 g via INTRAVENOUS
  Filled 2022-11-27: qty 100

## 2022-11-27 MED ORDER — LIDOCAINE-EPINEPHRINE 1 %-1:100000 IJ SOLN
INTRAMUSCULAR | Status: DC | PRN
  Administered 2022-11-27: 4 mL

## 2022-11-27 MED ORDER — BRIMONIDINE TARTRATE 0.15 % OP SOLN
1.0000 [drp] | Freq: Three times a day (TID) | OPHTHALMIC | Status: DC
  Administered 2022-11-27 – 2022-12-03 (×16): 1 [drp] via OPHTHALMIC
  Filled 2022-11-27: qty 5

## 2022-11-27 MED ORDER — CHLORHEXIDINE GLUCONATE 0.12 % MT SOLN
15.0000 mL | Freq: Once | OROMUCOSAL | Status: AC
  Administered 2022-11-27: 15 mL via OROMUCOSAL
  Filled 2022-11-27: qty 15

## 2022-11-27 MED ORDER — PHENYLEPHRINE HCL (PRESSORS) 10 MG/ML IV SOLN
INTRAVENOUS | Status: DC | PRN
  Administered 2022-11-27: 160 ug via INTRAVENOUS

## 2022-11-27 MED ORDER — ALBUMIN HUMAN 5 % IV SOLN: INTRAVENOUS | Status: DC | PRN

## 2022-11-27 MED ORDER — SIMETHICONE 80 MG PO CHEW: 40.0000 mg | CHEWABLE_TABLET | Freq: Four times a day (QID) | ORAL | Status: DC | PRN

## 2022-11-27 MED ORDER — MEPERIDINE HCL 25 MG/ML IJ SOLN: 6.2500 mg | INTRAMUSCULAR | Status: DC | PRN

## 2022-11-27 MED ORDER — BISACODYL 5 MG PO TBEC: 10.0000 mg | DELAYED_RELEASE_TABLET | Freq: Every day | ORAL | Status: DC | PRN

## 2022-11-27 MED ORDER — ONDANSETRON 4 MG PO TBDP: 4.0000 mg | ORAL_TABLET | Freq: Four times a day (QID) | ORAL | Status: DC | PRN

## 2022-11-27 SURGICAL SUPPLY — 83 items
ADH SKN CLS APL DERMABOND .7 (GAUZE/BANDAGES/DRESSINGS) ×3
APL PRP STRL LF DISP 70% ISPRP (MISCELLANEOUS) ×3
BAG BILE T-TUBES STRL (MISCELLANEOUS) IMPLANT
BAG COUNTER SPONGE SURGICOUNT (BAG) ×5 IMPLANT
BAG DRN 9.5 2 ADJ BELT ADPR (MISCELLANEOUS)
BAG SPNG CNTER NS LX DISP (BAG) ×6
BLADE CLIPPER SURG (BLADE) IMPLANT
CANISTER SUCT 3000ML PPV (MISCELLANEOUS) ×3 IMPLANT
CATH KIT ON-Q SILVERSOAK 7.5 (CATHETERS) IMPLANT
CATH KIT ON-Q SILVERSOAK 7.5IN (CATHETERS) IMPLANT
CHLORAPREP W/TINT 26 (MISCELLANEOUS) ×3 IMPLANT
CLIP LIGATING HEM O LOK PURPLE (MISCELLANEOUS) IMPLANT
CLIP LIGATING HEMO O LOK GREEN (MISCELLANEOUS) IMPLANT
CLIP LIGATING HEMOLOK MED (MISCELLANEOUS) IMPLANT
CLIP TI LARGE 6 (CLIP) IMPLANT
CLIP TI MEDIUM 24 (CLIP) IMPLANT
CLIP VESOCCLUDE LG 6/CT (CLIP) IMPLANT
COVER SURGICAL LIGHT HANDLE (MISCELLANEOUS) ×3 IMPLANT
DERMABOND ADVANCED .7 DNX12 (GAUZE/BANDAGES/DRESSINGS) ×3 IMPLANT
DRAIN CHANNEL 19F RND (DRAIN) ×1 IMPLANT
DRAPE LAPAROSCOPIC ABDOMINAL (DRAPES) ×1 IMPLANT
DRAPE WARM FLUID 44X44 (DRAPES) ×5 IMPLANT
ELECT BLADE 6.5 EXT (BLADE) ×2 IMPLANT
ELECT REM PT RETURN 9FT ADLT (ELECTROSURGICAL) ×3
ELECTRODE REM PT RTRN 9FT ADLT (ELECTROSURGICAL) ×3 IMPLANT
EVACUATOR SILICONE 100CC (DRAIN) IMPLANT
GAUZE SPONGE 4X4 12PLY STRL (GAUZE/BANDAGES/DRESSINGS) IMPLANT
GLOVE BIO SURGEON STRL SZ 6 (GLOVE) ×5 IMPLANT
GLOVE INDICATOR 6.5 STRL GRN (GLOVE) ×5 IMPLANT
GOWN STRL REUS W/ TWL LRG LVL3 (GOWN DISPOSABLE) ×8 IMPLANT
GOWN STRL REUS W/TWL 2XL LVL3 (GOWN DISPOSABLE) ×6 IMPLANT
GOWN STRL REUS W/TWL LRG LVL3 (GOWN DISPOSABLE) ×9
HANDLE SUCTION POOLE (INSTRUMENTS) ×3 IMPLANT
HEMOSTAT SURGICEL 2X14 (HEMOSTASIS) IMPLANT
KIT BASIN OR (CUSTOM PROCEDURE TRAY) ×3 IMPLANT
KIT TURNOVER KIT B (KITS) ×3 IMPLANT
L-HOOK LAP DISP 36CM (ELECTROSURGICAL)
LHOOK LAP DISP 36CM (ELECTROSURGICAL) ×1 IMPLANT
LIGASURE IMPACT 36 18CM CVD LR (INSTRUMENTS) IMPLANT
NS IRRIG 1000ML POUR BTL (IV SOLUTION) ×6 IMPLANT
PACK GENERAL/GYN (CUSTOM PROCEDURE TRAY) ×1 IMPLANT
PAD ARMBOARD 7.5X6 YLW CONV (MISCELLANEOUS) ×6 IMPLANT
PENCIL BUTTON HOLSTER BLD 10FT (ELECTRODE) ×1 IMPLANT
PENCIL SMOKE EVACUATOR (MISCELLANEOUS) ×2 IMPLANT
RELOAD 45 VASCULAR/THIN (ENDOMECHANICALS) IMPLANT
RELOAD STAPLE 45 2.5 WHT GRN (ENDOMECHANICALS) IMPLANT
SCISSORS LAP 5X35 DISP (ENDOMECHANICALS) IMPLANT
SET IRRIG TUBING LAPAROSCOPIC (IRRIGATION / IRRIGATOR) IMPLANT
SET TUBE SMOKE EVAC HIGH FLOW (TUBING) ×3 IMPLANT
SHEARS FOC LG CVD HARMONIC 17C (MISCELLANEOUS) ×2 IMPLANT
SLEEVE Z-THREAD 5X100MM (TROCAR) ×3 IMPLANT
SPECIMEN JAR X LARGE (MISCELLANEOUS) ×1 IMPLANT
SPIKE FLUID TRANSFER (MISCELLANEOUS) ×2 IMPLANT
SPONGE INTESTINAL PEANUT (DISPOSABLE) IMPLANT
SPONGE T-LAP 18X18 ~~LOC~~+RFID (SPONGE) ×6 IMPLANT
STAPLE ECHEON FLEX 60 POW ENDO (STAPLE) IMPLANT
STAPLER VISISTAT 35W (STAPLE) ×3 IMPLANT
SUCTION POOLE HANDLE (INSTRUMENTS) ×3
SUT ETHILON 2 0 FS 18 (SUTURE) ×1 IMPLANT
SUT MNCRL AB 4-0 PS2 18 (SUTURE) ×3 IMPLANT
SUT NOVA 1 T20/GS 25DT (SUTURE) IMPLANT
SUT PDS AB 1 TP1 96 (SUTURE) ×6 IMPLANT
SUT PROLENE 4 0 RB 1 (SUTURE) ×3
SUT PROLENE 4-0 RB1 18X2 ARM (SUTURE) ×2 IMPLANT
SUT SILK 0 TIES 10X30 (SUTURE) ×1 IMPLANT
SUT SILK 2 0 REEL (SUTURE) IMPLANT
SUT SILK 2 0 SH (SUTURE) IMPLANT
SUT SILK 2 0 SH CR/8 (SUTURE) ×2 IMPLANT
SUT SILK 2 0 TIES 10X30 (SUTURE) ×3 IMPLANT
SUT SILK 2 0SH CR/8 30 (SUTURE) ×2 IMPLANT
SUT SILK 3 0SH CR/8 30 (SUTURE) ×2 IMPLANT
SUT VIC AB 3-0 SH 8-18 (SUTURE) ×2 IMPLANT
TOWEL GREEN STERILE (TOWEL DISPOSABLE) ×3 IMPLANT
TOWEL GREEN STERILE FF (TOWEL DISPOSABLE) ×3 IMPLANT
TRAY FOLEY MTR SLVR 14FR STAT (SET/KITS/TRAYS/PACK) ×3 IMPLANT
TRAY LAPAROSCOPIC MC (CUSTOM PROCEDURE TRAY) ×3 IMPLANT
TROCAR 11X100 Z THREAD (TROCAR) IMPLANT
TROCAR BALLN 12MMX100 BLUNT (TROCAR) IMPLANT
TROCAR Z-THREAD OPTICAL 5X100M (TROCAR) ×3 IMPLANT
TUBE CONNECTING 12X1/4 (SUCTIONS) ×2 IMPLANT
TUNNELER SHEATH ON-Q 16GX12 DP (PAIN MANAGEMENT) IMPLANT
WARMER LAPAROSCOPE (MISCELLANEOUS) ×3 IMPLANT
YANKAUER SUCT BULB TIP NO VENT (SUCTIONS) ×2 IMPLANT

## 2022-11-27 NOTE — Transfer of Care (Signed)
Immediate Anesthesia Transfer of Care Note  Patient: Jared [name]  Procedure(s) Performed: DIAGNOSTIC LAPAROSCOPY (Abdomen) EXPLORATORY LAPAROTOMY (Abdomen) INTRAOPERATIVE ULTRASOUND (Abdomen)  Patient Location: PACU  Anesthesia Type:General  Level of Consciousness: awake, alert , and oriented  Airway & Oxygen Therapy: Patient Spontanous Breathing and Patient connected to nasal cannula oxygen  Post-op Assessment: Report given to RN, Post -op Vital signs reviewed and stable, Patient moving all extremities X 4, and Patient able to stick tongue midline  Post vital signs: Reviewed  Last Vitals:  Vitals Value Taken Time  BP 90/74 11/27/22 1130  Temp 97.8   Pulse 114 11/27/22 1132  Resp 17 11/27/22 1132  SpO2 95 % 11/27/22 1132  Vitals shown include unvalidated device data.  Last Pain:  Vitals:   11/27/22 0850  TempSrc:   PainSc: 0-No pain         Complications: No notable events documented.

## 2022-11-27 NOTE — Care Plan (Signed)
Pt arrived to room, VSS, A/O, has c/o pain but falls back to sleep.  Pt moved from stretcher to bed, abdomen has incision noted, well approximated with dermabond closure, no bleeding noted.  He has 1 port site to left of abdomen, this too, is closed with dermabond and no bleeding noted.  Pt has bilateral IV sites with LR noted to r hand.  Foley catheter patent draining clear yellow urine.  No other type of line or drain noted.  No epidural present on admission to floor.  Lungs CTA, RRR, bowel sounds noted, faint hypoactive.  Family at bedside.  NADN.  Wife states that patient normally has lower pulse and this is normal for patient. Sreece, RN

## 2022-11-27 NOTE — Interval H&P Note (Signed)
History and Physical Interval Note:  11/27/2022 7:40 AM  Jared [name]  has presented today for surgery, with the diagnosis of PANCREATIC CANCER, LEFT ADRENAL MASS.  The various methods of treatment have been discussed with the patient and family. After consideration of risks, benefits and other options for treatment, the patient has consented to  Procedure(s) with comments: DIAGNOSTIC LAPAROSCOPY (N/A) - EPIDURAL OPEN DISTAL PANCREATECTOMY SPLENECTOMY (N/A) LEFT ADRENALECTOMY (Left) as a surgical intervention.  The patient's history has been reviewed, patient examined, no change in status, stable for surgery.  I have reviewed the patient's chart and labs.  Questions were answered to the patient's satisfaction.     Stark Klein

## 2022-11-27 NOTE — Progress Notes (Signed)
Discussed unresectable tumor with metastatic disease with family immediately post op and with patient around 2 hours later.    I communicated with IR to see if he is candidate for celiac plexus block.  I also communicated with Dr. [name] (oncology) regarding unresectable dx.    Pt will be here several days for pain control and diet advance.    Will also get nutrition to see him.

## 2022-11-27 NOTE — Plan of Care (Signed)

## 2022-11-27 NOTE — Anesthesia Procedure Notes (Signed)
Arterial Line Insertion Start/End1/30/2024 7:50 AM, 11/27/2022 8:00 AM Performed by: Annye Asa, MD, Minerva Ends, CRNA, CRNA  Patient location: Pre-op. Preanesthetic checklist: patient identified, IV checked, site marked, risks and benefits discussed, surgical consent, monitors and equipment checked, pre-op evaluation, timeout performed and anesthesia consent Lidocaine 1% used for infiltration Left, radial was placed Catheter size: 20 G Hand hygiene performed  and maximum sterile barriers used   Attempts: 2 Procedure performed using ultrasound guided technique. Following insertion, dressing applied. Post procedure assessment: normal and unchanged  Patient tolerated the procedure well with no immediate complications.

## 2022-11-27 NOTE — Op Note (Addendum)
PREOPERATIVE DIAGNOSIS: adenocarcinoma of the pancreatic body and tail, s/p neoadjuvant chemotherapy, uT2N0.   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 Metastatic implants to the stomach, omentum and transverse colon mesentery. Frozen biopsy of omental implant positive for adenocarcinoma of the pancreas   SPECIMENS: Omental implant   ESTIMATED BLOOD LOSS: 10 mL.   COMPLICATIONS: None known.   PROCEDURE:   Patient was identified in the holding area, taken to the operating room, and placed supine on the operating room table. General anesthesia was induced. The patient's abdomen was prepped and draped in a sterile fashion, after a Foley catheter was placed. A time-out was performed according to the surgical safety check list. When all was correct we continued.   The patient was placed in reverse trendelenburg position and rotated to the right.  The left subcostal margin was anesthetized with local anesthesia.  A 5 mm optiview trocar was placed under direct visualization.  The abdomen was insufflated with carbon dioxide.  The abdomen was examined.  A second port was placed above the umbilicus to be able to better visualize the liver.  The liver easily elevated off the stomach.  No concerning liver or peritoneal lesions were seen. No biopsies were performed.   A midline incision was made from the xiphoid to just below the umbilicus. The subcutaneous tissues were divided with the Bovie electrocautery. The peritoneum was entered in the center of the abdomen. The subcutaneous tissues and fascia of the muscular layers were  taken with the cautery. Care was taken to protect the underlying viscera during entry.    The Bookwalter self-retaining retractor was placed for visualization. Upon further inspection of the lesser curvature of the stomach a mass was noted. The lesser sac was entered and further implants of the posterior side of the stomach and transverse colon mesentery were noted.  A small implant at the omentum was also noted and was resected and sent for frozen biopsy. Biopsy returned positive for adenocarcinoma. At that point the decision was made to abort the procedure due to evidence of metastatic disease.   The abdomen was then irrigated and all the laparotomy sponges were removed. A lap count was performed, which was correct. The fascia was then closed with #1 looped running PDS sutures. The skin was irrigated and then closed with interrupted vicryl deep dermal sutures and continuous 4-0 monocryl. The 5 mm LUQ port was closed with 4-0 monocryl. The wounds were cleaned, dried and dressed with a sterile dressing.   The patient tolerated the procedure well and was extubated and taken to PACU in stable condition. Needle and sponge counts were correct x2.   Pancreatic body implant and posterior stomach implants   Pancreatic body implant and posterior stomach implants   Posterior stomach implants   Pancreatic mass of the body   Mass of the lesser curvature of the stomach

## 2022-11-27 NOTE — Anesthesia Procedure Notes (Addendum)
Epidural Patient location during procedure: pre-op Start time: 11/27/2022 8:30 AM End time: 11/27/2022 8:42 AM  Staffing Anesthesiologist: Annye Asa, MD Performed: anesthesiologist   Preanesthetic Checklist Completed: patient identified, IV checked, risks and benefits discussed, surgical consent, monitors and equipment checked, pre-op evaluation and timeout performed  Epidural Patient position: sitting Prep: DuraPrep Patient monitoring: blood pressure, continuous pulse ox, cardiac monitor and heart rate Approach: midline Location: L2-L3 Injection technique: LOR air  Needle:  Needle type: Tuohy  Needle gauge: 17 G Needle length: 9 cm Needle insertion depth: 6.5 cm Catheter type: closed end flexible Catheter size: 19 Gauge Catheter at skin depth: 12 cm Test dose: negative and 1.5% lidocaine with Epi 1:200 K  Additional Notes Pt identified in Labor room.  Monitors applied. Working IV access confirmed. Sterile prep, drape lumbar spine.  1% lido local L 2,3.  #17ga Touhy LOR air at 6.5 cm L 2,3, cath in easily to 12 cm skin. Test dose OK, cath dosed and infusion begun.  Patient asymptomatic, VSS, no heme aspirated, tolerated well.  Jenita Seashore, MD Reason for block:post-op pain management

## 2022-11-27 NOTE — Consult Note (Signed)
Chief Complaint: Patient was seen in consultation today for abdominal pain at the request of Dr. Stark Klein  Supervising Physician: Corrie Mckusick  Patient Status: [facility] - In-pt  History of Present Illness: Jared [name] is a 77 y.o. male who presented with mid to upper abdominal discomfort at the end of 2022.  A CT scan demonstrated a distal pancreatic mass.  Incidentally found on additional testing, was 2 stones in the common bile duct with some dilation of the common bile duct and common hepatic duct.     He has been undergoing neoadjuvant chemotherapy (FOLFIRINOX) with Dr. [name]. He has received 5 cycles.  He is s/p exploratory laparotomy with biopsy of omental implant this morning which demonstrated metastatic disease.  Intended resection was not able to be performed at this time and procedure aborted.  He has been experiencing centralized epigastric pain, more noticeable when lifting objects and when eating.  The pain comes and goes multiple times during the day.  IR consulted for candidacy for celiac plexus nerve block.     Past Medical History:  Diagnosis Date   Aortic atherosclerosis ([place]) 10/02/2022   Arthritis    SHOULDER & KNEES   Bilateral cataracts 10/02/2022   Cataract    bilateral sx   Choledocholithiasis 07/23/2022   Chronic kidney disease    pt states he does not have kidney disease, just a hx of kidney stones   GERD 02/22/2009   Qualifier: Diagnosis of   By: Carlean Purl MD, Tonna Boehringer E      Glaucoma 10/02/2022   History of kidney stones    Hyperlipidemia 09/06/2021   The 10-year ASCVD risk score (Arnett DK, et al., 2019) is: 12.5%    Values used to calculate the score:      Age: 51 years      Sex: Male      Is Non-Hispanic African American: Yes      Diabetic: No      Tobacco smoker: No      Systolic Blood Pressure: 867 mmHg      Is BP treated: No      HDL Cholesterol: 82 mg/dL      Total Cholesterol: 241 mg/dL      Inguinal hernia    right   Leukocytosis  10/02/2022   Pancreatic cancer ([place]) 2023   Prostate cancer ([place]) 10/29/1988    Past Surgical History:  Procedure Laterality Date   CATARACT EXTRACTION Bilateral    CHOLECYSTECTOMY N/A 07/14/2013   Procedure: LAPAROSCOPIC CHOLECYSTECTOMY;  Surgeon: Stark Klein, MD;  Location: [facility];  Service: General;  Laterality: N/A;   COLONOSCOPY  01/07/2004   Dr. Silvano Rusk   COLONOSCOPY  02/2022   CG-MAC-mira(good)-tics-2 polyps   ENDOSCOPIC RETROGRADE CHOLANGIOPANCREATOGRAPHY (ERCP) WITH PROPOFOL N/A 08/23/2022   Procedure: ENDOSCOPIC RETROGRADE CHOLANGIOPANCREATOGRAPHY (ERCP) WITH PROPOFOL;  Surgeon: Irving Copas., MD;  Location: [facility];  Service: Gastroenterology;  Laterality: N/A;   ESOPHAGOGASTRODUODENOSCOPY (EGD) WITH PROPOFOL N/A 07/05/2022   Procedure: ESOPHAGOGASTRODUODENOSCOPY (EGD) WITH PROPOFOL;  Surgeon: Rush Landmark Telford Nab., MD;  Location: [place];  Service: Gastroenterology;  Laterality: N/A;   EUS N/A 07/05/2022   Procedure: UPPER ENDOSCOPIC ULTRASOUND (EUS) RADIAL;  Surgeon: Irving Copas., MD;  Location: [place];  Service: Gastroenterology;  Laterality: N/A;   EYE SURGERY     FINE NEEDLE ASPIRATION  07/05/2022   Procedure: FINE NEEDLE ASPIRATION (FNA) LINEAR;  Surgeon: Irving Copas., MD;  Location: [place];  Service: Gastroenterology;;   INGUINAL HERNIA REPAIR  Right    PORTACATH PLACEMENT Left 07/31/2022   Procedure: INSERTION PORT-A-CATH;  Surgeon: Stark Klein, MD;  Location: [place];  Service: General;  Laterality: Left;   PROSTATECTOMY     REMOVAL OF STONES  08/23/2022   Procedure: REMOVAL OF STONES;  Surgeon: Irving Copas., MD;  Location: [facility];  Service: Gastroenterology;;   Joan Mayans  08/23/2022   Procedure: Joan Mayans;  Surgeon: Mansouraty, Telford Nab., MD;  Location: [facility];  Service: Gastroenterology;;   TONSILLECTOMY      Allergies: Claritin-d 24 hour [loratadine-pseudoephedrine er]  and Lupron [leuprolide]  Medications: Prior to Admission medications   Medication Sig Start Date End Date Taking? Authorizing Provider  Ascorbic Acid (VITAMIN C) 500 MG tablet Take 500 mg by mouth daily.     Yes [provider]  brimonidine (ALPHAGAN) 0.15 % ophthalmic solution Place 1 drop into the left eye in the morning and at bedtime.   Yes [provider]  Calcium Carbonate Antacid (TUMS ULTRA 1000 PO) Take 2,000-3,000 mg by mouth See admin instructions. 3000 mg by mouth in the morning and 2000 mg by mouth at night   Yes [provider]  Carboxymethylcell-Glycerin PF (REFRESH OPTIVE PF) 0.5-0.9 % SOLN Place 1 drop into both eyes as needed (dry eyes). 04/28/18  Yes [provider]  Docusate Sodium (COLACE PO) Take 1 tablet by mouth daily at 12 noon. 11/15/22  Yes [provider]  Dorzolamide HCl-Timolol Mal PF 2-0.5 % SOLN Place 1 drop into the left eye 2 (two) times daily. 12/01/21  Yes [provider]  fluconazole (DIFLUCAN) 100 MG tablet Take 1 tablet (100 mg total) by mouth daily. 11/19/22  Yes Ladell Pier, MD  Garlic 5697 MG CAPS Take 1,000 mg by mouth daily.   Yes [provider]  GEMTESA 75 MG TABS Take 75 mg by mouth daily. 01/31/22  Yes [provider]  HYDROmorphone (DILAUDID) 4 MG tablet Take 1-2 tablets (4-8 mg total) by mouth every 4 (four) hours as needed for severe pain. 11/16/22  Yes Owens Shark, NP  lidocaine-prilocaine (EMLA) cream Apply 1 Application topically as needed. 07/27/22  Yes Ladell Pier, MD  Magnesium Hydroxide (DULCOLAX PO) Take 1 tablet by mouth daily. 11/15/22  Yes [provider]  olopatadine (PATANOL) 0.1 % ophthalmic solution Place 1 drop into both eyes as needed for allergies.   Yes [provider]  ondansetron (ZOFRAN) 8 MG tablet Take 1 tablet (8 mg total) by mouth every 8 (eight) hours as needed for nausea or vomiting (Starting day 3 after chemo as needed for  nausea(received Aloxi with chemo)). 09/10/22  Yes Ladell Pier, MD  pantoprazole (PROTONIX) 40 MG tablet Take 1 tablet (40 mg total) by mouth 2 (two) times daily. 10/07/22  Yes Eugenie Filler, MD  potassium chloride SA (KLOR-CON M) 20 MEQ tablet Take 1 tablet (20 mEq total) by mouth daily. 09/26/22  Yes Ladell Pier, MD  prochlorperazine (COMPAZINE) 10 MG tablet Take 1 tablet (10 mg total) by mouth every 6 (six) hours as needed for nausea or vomiting. 10/07/22  Yes Eugenie Filler, MD  sodium chloride (AYR) 0.65 % nasal spray Place 2-3 sprays into the nose as needed for congestion.   Yes [provider]  sucralfate (CARAFATE) 1 g tablet Dissolve 1 tablet in 54m of water and mix into a slurry. Then take slurry by mouth every 6 (six) hours. 10/07/22  Yes TEugenie Filler MD  acetaminophen (TYLENOL) 325  MG tablet Take 325 mg by mouth daily as needed for headache.    [provider]  cholecalciferol (VITAMIN D3) 25 MCG (1000 UNIT) tablet Take 1,000 Units by mouth daily.    [provider]  Triptorelin Pamoate 11.25 MG SUSR Inject 11.25 mg into the muscle as needed (PSA).    [provider]     Family History  Problem Relation Age of Onset   Pancreatic cancer Mother 41   Kidney cancer Mother 63   Thyroid cancer Mother 62   Prostate cancer Father 62   Cancer Sister 19       carcinoid tumor   Prostate cancer Brother 25   Prostate cancer Brother 14   Thyroid cancer Brother 84   Prostate cancer Brother 88   Prostate cancer Brother 15   Pancreatic cancer Paternal Aunt 77   Pancreatic cancer Paternal Aunt 40   Prostate cancer Paternal Uncle 59   Prostate cancer Paternal Uncle 31   Cervical cancer Maternal Grandmother 91   Prostate cancer Paternal Grandfather 67   Colon cancer Neg Hx    Stomach cancer Neg Hx    Esophageal cancer Neg Hx    Colon polyps Neg Hx    Rectal cancer Neg Hx     Social History   Socioeconomic History   Marital  status: Married    Spouse name: Not on file   Number of children: 1   Years of education: Not on file   Highest education level: Not on file  Occupational History   Occupation: retired  Tobacco Use   Smoking status: Former    Packs/day: 0.50    Years: 20.00    Total pack years: 10.00    Types: Cigarettes    Quit date: 10/29/1981    Years since quitting: 41.1   Smokeless tobacco: Never  Vaping Use   Vaping Use: Never used  Substance and Sexual Activity   Alcohol use: No   Drug use: No   Sexual activity: Yes  Other Topics Concern   Not on file  Social History Narrative   Married to [employer]. Retired. 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            Social Determinants of Health   Financial Resource Strain: Low Risk  (07/26/2022)   Overall Financial Resource Strain (CARDIA)    Difficulty of Paying Living Expenses: Not hard at all  Food Insecurity: No Food Insecurity (10/02/2022)   Hunger Vital Sign    Worried About Running Out of Food in the Last Year: Never true    Ran Out of Food in the Last Year: Never true  Transportation Needs: No Transportation Needs (10/02/2022)   PRAPARE - Hydrologist (Medical): No    Lack of Transportation (Non-Medical): No  Physical Activity: Not on file  Stress: Not on file  Social Connections: Socially Integrated (07/26/2022)   Social Connection and Isolation Panel [NHANES]    Frequency of Communication with Friends and Family: More than three times a week    Frequency of Social Gatherings with Friends and Family: More than three times a week    Attends Religious Services: More than 4 times per year    Active Member of [employer] or Organizations: Yes    Attends Music therapist: More than 4 times per year    Marital Status: Married    Review of Systems:  A 12 point ROS discussed and pertinent positives are indicated in the HPI above.  All  other systems are negative.  Vital Signs: BP 114/77 (BP Location: Left Arm)   Pulse (!) 52   Temp 98.9 F (37.2 C) (Oral)   Resp 16   Ht 6' (1.829 m)   Wt 231 lb (104.8 kg)   SpO2 98%   BMI 31.33 kg/m   Physical Exam Constitutional:      Appearance: He is ill-appearing.     Comments: grimaces at times during discussion while holding his abdomen  HENT:     Head: Normocephalic and atraumatic.     Mouth/Throat:     Mouth: Mucous membranes are moist.     Pharynx: Oropharynx is clear.  Cardiovascular:     Rate and Rhythm: Normal rate.     Pulses: Normal pulses.  Pulmonary:     Effort: Pulmonary effort is normal. No respiratory distress.  Abdominal:     Tenderness: There is abdominal tenderness. There is guarding.     Comments: Bandage over midline incision and port site  Skin:    General: Skin is warm and dry.  Neurological:     General: No focal deficit present.     Mental Status: He is alert and oriented to person, place, and time.  Psychiatric:        Mood and Affect: Mood normal.        Behavior: Behavior normal.   Imaging: No results found.  Labs:  CBC: Recent Labs    10/05/22 0513 10/06/22 0328 10/07/22 1012 11/22/22 0941  WBC 17.9* 10.7* 10.2 5.2  HGB 12.8* 10.9* 12.1* 13.7  HCT 38.1* 32.5* 36.5* 42.9  PLT 150 147* 181 152    COAGS: Recent Labs    10/02/22 0655 11/22/22 0941  INR  --  1.0  APTT 22*  --     BMP: Recent Labs    10/06/22 0328 10/07/22 1012 10/08/22 0230 10/16/22 1047 11/22/22 0941  NA 139 138 140 141 141  K 3.4* 3.6 3.5 4.8 4.3  CL 109 106 108 105 103  CO2 24 25 26 21 29   GLUCOSE 109* 125* 107* 136* 159*  BUN <5* <5* 5* 8 10  CALCIUM 8.5* 8.8* 8.7* 9.5 9.7  CREATININE 0.63 0.65 0.71 0.91 0.79  GFRNONAA >60 >60 >60  --  >60    LIVER FUNCTION TESTS: Recent Labs    10/01/22 1459 10/02/22 0529 10/03/22 0510 11/22/22 0941  BILITOT 0.9 0.8 1.2 0.5  AST 28 33 26 31  ALT 32 37 30 32  ALKPHOS 114 131* 100 72  PROT  5.8* 6.5 5.3* 6.5  ALBUMIN 3.2* 3.7 3.1* 3.9    TUMOR MARKERS: No results for input(s): "AFPTM", "CEA", "CA199", "CHROMGRNA" in the last 8760 hours.  Assessment and Plan:  Pancreatic Cancer --due to metastatic disease, laparotomy today was aborted with only omental biopsy and not resection of disease --Dr. Barry Dienes would like attention turned to pain management and consults IR for possible celiac plexus block --In initial conversation with Jared [name], the nerve block is something he would like to pursue sooner rather than later. --He has asked for additional conversation when his wife is present --Scheduling of procedure to be determined.  Given just having laparotomy, may consider performing in OP setting after having some recovery.  Will have to discuss with IR section to determine consensus. --Pt not on thinners, appropriate orders will need to be placed when plans are made.  Risks and benefits discussed with the patient including bleeding, infection, damage to adjacent structures, bowel perforation/fistula connection, and sepsis.  All of the patient's questions were answered, patient is agreeable to proceed. Consent signed and in IR.   Thank you for this interesting consult.  I greatly enjoyed meeting Jared [name] and look forward to participating in their care.  A copy of this report was sent to the requesting provider on this date.  Electronically Signed: Pasty Spillers, PA 11/27/2022, 3:27 PM   I spent a total of  30 minutes  in face to face in clinical consultation, greater than 50% of which was counseling/coordinating care for metastatic pancreatic cancer.

## 2022-11-27 NOTE — Anesthesia Procedure Notes (Signed)
Procedure Name: Intubation Date/Time: 11/27/2022 9:35 AM  Performed by: Thelma Comp, CRNAPre-anesthesia Checklist: Patient identified, Emergency Drugs available, Suction available and Patient being monitored Patient Re-evaluated:Patient Re-evaluated prior to induction Oxygen Delivery Method: Circle system utilized Preoxygenation: Pre-oxygenation with 100% oxygen Induction Type: IV induction Ventilation: Mask ventilation without difficulty Laryngoscope Size: Mac and 4 Grade View: Grade I Tube type: Oral Tube size: 7.5 mm Number of attempts: 1 Airway Equipment and Method: Stylet and Bite block Placement Confirmation: ETT inserted through vocal cords under direct vision, positive ETCO2 and breath sounds checked- equal and bilateral Secured at: 23 cm Tube secured with: Tape Dental Injury: Teeth and Oropharynx as per pre-operative assessment

## 2022-11-27 NOTE — Anesthesia Postprocedure Evaluation (Signed)
Anesthesia Post Note  Patient: Jared [name]  Procedure(s) Performed: DIAGNOSTIC LAPAROSCOPY (Abdomen) EXPLORATORY LAPAROTOMY (Abdomen) INTRAOPERATIVE ULTRASOUND (Abdomen)     Patient location during evaluation: PACU Anesthesia Type: General Level of consciousness: awake and alert, patient cooperative and oriented Pain management: pain level controlled Vital Signs Assessment: post-procedure vital signs reviewed and stable Respiratory status: spontaneous breathing, nonlabored ventilation, respiratory function stable and patient connected to nasal cannula oxygen Cardiovascular status: blood pressure returned to baseline and stable Postop Assessment: no apparent nausea or vomiting and patient able to bend at knees Anesthetic complications: no   No notable events documented.  Last Vitals:  Vitals:   11/27/22 1215 11/27/22 1245  BP: 120/84 123/82  Pulse: (!) 57 (!) 51  Resp: 16 (!) 21  Temp:  37.2 C  SpO2: 96% 100%    Last Pain:  Vitals:   11/27/22 1200  TempSrc:   PainSc: Asleep                 Linnae Rasool,E. Starasia Sinko

## 2022-11-28 ENCOUNTER — Encounter (HOSPITAL_COMMUNITY): Payer: Self-pay | Admitting: General Surgery

## 2022-11-28 LAB — GLUCOSE, CAPILLARY
Glucose-Capillary: 132 mg/dL — ABNORMAL HIGH (ref 70–99)
Glucose-Capillary: 113 mg/dL — ABNORMAL HIGH (ref 70–99)
Glucose-Capillary: 157 mg/dL — ABNORMAL HIGH (ref 70–99)
Glucose-Capillary: 131 mg/dL — ABNORMAL HIGH (ref 70–99)

## 2022-11-28 LAB — CBC
HCT: 38.8 % — ABNORMAL LOW (ref 39.0–52.0)
Hemoglobin: 12.6 g/dL — ABNORMAL LOW (ref 13.0–17.0)
MCH: 29.4 pg (ref 26.0–34.0)
MCHC: 32.5 g/dL (ref 30.0–36.0)
MCV: 90.4 fL (ref 80.0–100.0)
Platelets: 168 10*3/uL (ref 150–400)
RBC: 4.29 MIL/uL (ref 4.22–5.81)
RDW: 13.4 % (ref 11.5–15.5)
WBC: 8 10*3/uL (ref 4.0–10.5)
nRBC: 0 % (ref 0.0–0.2)

## 2022-11-28 LAB — BASIC METABOLIC PANEL
Anion gap: 9 (ref 5–15)
BUN: 6 mg/dL — ABNORMAL LOW (ref 8–23)
CO2: 25 mmol/L (ref 22–32)
Calcium: 8.9 mg/dL (ref 8.9–10.3)
Chloride: 100 mmol/L (ref 98–111)
Creatinine, Ser: 0.83 mg/dL (ref 0.61–1.24)
GFR, Estimated: >60 mL/min (ref >60)
Glucose, Bld: 115 mg/dL — ABNORMAL HIGH (ref 70–99)
Potassium: 4.4 mmol/L (ref 3.5–5.1)
Sodium: 134 mmol/L — ABNORMAL LOW (ref 135–145)

## 2022-11-28 MED ORDER — DORZOLAMIDE HCL-TIMOLOL MAL PF 2-0.5 % OP SOLN
1.0000 [drp] | Freq: Two times a day (BID) | OPHTHALMIC | Status: DC
  Administered 2022-11-28 – 2022-12-03 (×11): 1 [drp] via OPHTHALMIC

## 2022-11-28 MED ORDER — ENSURE ENLIVE PO LIQD
237.0000 mL | Freq: Two times a day (BID) | ORAL | Status: DC
  Administered 2022-11-28 – 2022-12-03 (×8): 237 mL via ORAL

## 2022-11-28 NOTE — Progress Notes (Signed)
Initial Nutrition Assessment  DOCUMENTATION CODES:   Not applicable  INTERVENTION:  Liberalize diet from a carb modified to a regular diet to provide widest variety of menu options to enhance nutritional adequacy Ensure Enlive po BID, each supplement provides 350 kcal and 20 grams of protein. Snacks TID between meals to increase nutritional adequacy and decrease unintentional weight loss  NUTRITION DIAGNOSIS:   Increased nutrient needs related to cancer and cancer related treatments as evidenced by estimated needs.  GOAL:   Patient will meet greater than or equal to 90% of their needs  MONITOR:   PO intake, Supplement acceptance, Labs, Weight trends  REASON FOR ASSESSMENT:   Consult Assessment of nutrition requirement/status  ASSESSMENT:   Pt admitted with primary adenocarcinoma of pancreatic tail. S/p 5 cycles folfirinox.  1/30: s/p exlap with biopsy of omental implant with findings of metastatic disease; intended resection unable to be performed  Radiology consulted for celiac nerve block. May perform OP to allow healing time s/p exlap.  Spoke with pt at bedside. His daughter and wife also present during assessment.  Pt noted to have breakfast tray on table. He ate about 33-50% of this meal. He states that this is typical for his usual intake at home. He eats "until his stomach says no more." He recalls eating 3-4 small meals daily with snacks in between such as chips and a soda. He also endorses drinking 2 Boost daily. Pt reports that during his chemotherapy he was having difficulty with emesis and esophagitis but this has since resolved and he denies difficulty swallowing foods.   Pt has been [facility]itian [facility] which has been helpful for him. Encouraged ongoing follow up as needed if additional nutrition concerns arise.   Pt states that his weight prior to chemo initiation was about 230 lbs and got down to about 215 lbs. He states that his weight  has been holding steady around 215 lbs now.   Continue to encourage more frequent PO intake to minimize weight loss as best as possible. He is agreeable to receive snacks during admission. He enjoys food items such as chips, fruit cup, jello, pudding and soda with snacks.   Medications: Vitamin C '500mg'$  daily, TUMS '2000mg'$  daily, Vitamin D3, colace, SSI 0-9 units TID, SSI 3 units TID, protonix, klor-con, sucralfate  Labs: sodium 134, BUN 6, CBG's 113-155 x24 horus   NUTRITION - FOCUSED PHYSICAL EXAM:  Flowsheet Row Most Recent Value  Orbital Region No depletion  Upper Arm Region No depletion  Thoracic and Lumbar Region No depletion  Buccal Region Mild depletion  Temple Region Mild depletion  Clavicle Bone Region Mild depletion  Clavicle and Acromion Bone Region Mild depletion  Scapular Bone Region No depletion  Dorsal Hand Mild depletion  Patellar Region Mild depletion  Anterior Thigh Region Mild depletion  Posterior Calf Region No depletion  Edema (RD Assessment) None  Hair Reviewed  Eyes Reviewed  Mouth Reviewed  Skin Reviewed  Nails Reviewed       Diet Order:   Diet Order             Diet regular Room service appropriate? Yes; Fluid consistency: Thin  Diet effective now                   EDUCATION NEEDS:   Education needs have been addressed  Skin:  Skin Assessment: Skin Integrity Issues: Skin Integrity Issues:: Incisions Incisions: abdomen (closed)  Last BM:  PTA  Height:  Ht Readings from Last 1 Encounters:  11/27/22 6' (1.829 m)    Weight:   Wt Readings from Last 1 Encounters:  11/27/22 104.8 kg    Ideal Body Weight:  80.9 kg  BMI:  Body mass index is 31.33 kg/m.  Estimated Nutritional Needs:   Kcal:  1586-8257  Protein:  115-130g  Fluid:  >/=2L  Clayborne Dana, RDN, LDN Clinical Nutrition

## 2022-11-28 NOTE — Addendum Note (Signed)
Addended by: Talbert Cage L on: 11/28/2022 04:37 PM   Modules accepted: Orders

## 2022-11-28 NOTE — Progress Notes (Signed)
1 Day Post-Op   Subjective/Chief Complaint: Pain controlled   Objective: Vital signs in last 24 hours: Temp:  [98.5 F (36.9 C)-98.9 F (37.2 C)] 98.5 F (36.9 C) (01/31 0730) Pulse Rate:  [51-69] 69 (01/31 0730) Resp:  [12-18] 16 (01/31 0730) BP: (106-125)/(67-87) 116/76 (01/31 0730) SpO2:  [94 %-100 %] 98 % (01/31 0730)    Intake/Output from previous day: 01/30 0701 - 01/31 0700 In: 3591.4 [P.O.:20; I.V.:3150; IV Piggyback:421.4] Out: 1600 [Urine:1600] Intake/Output this shift: No intake/output data recorded.  General appearance: alert, cooperative, and no distress Resp: breathing comfortably GI: soft, non distended, approp tender Extremities: extremities normal, atraumatic, no cyanosis or edema  Lab Results:  Recent Labs    11/28/22 1049  WBC 8.0  HGB 12.6*  HCT 38.8*  PLT 168   BMET Recent Labs    11/28/22 1049  NA 134*  K 4.4  CL 100  CO2 25  GLUCOSE 115*  BUN 6*  CREATININE 0.83  CALCIUM 8.9   PT/INR No results for input(s): "LABPROT", "INR" in the last 72 hours. ABG No results for input(s): "PHART", "HCO3" in the last 72 hours.  Invalid input(s): "PCO2", "PO2"  Studies/Results: No results found.  Anti-infectives: Anti-infectives (From admission, onward)    Start     Dose/Rate Route Frequency Ordered Stop   11/27/22 1730  ceFAZolin (ANCEF) IVPB 2g/100 mL premix        2 g 200 mL/hr over 30 Minutes Intravenous Every 8 hours 11/27/22 1403 11/27/22 1830   11/27/22 0715  ceFAZolin (ANCEF) IVPB 2g/100 mL premix        2 g 200 mL/hr over 30 Minutes Intravenous On call to O.R. 11/27/22 0709 11/27/22 0937       Assessment/Plan: s/p Procedure(s) with comments: DIAGNOSTIC LAPAROSCOPY (N/A) - EPIDURAL EXPLORATORY LAPAROTOMY (N/A) INTRAOPERATIVE ULTRASOUND (N/A) d/c foley Advance diet See about IR timing for block.  D/c tomorrow or Friday depending on timing of block.    LOS: 1 day    Jared [name] 11/28/2022

## 2022-11-28 NOTE — TOC Initial Note (Signed)
Transition of Care [facility]) - Initial/Assessment Note    Patient Details  Name: Jared [name] MRN: [id number] Date of Birth: 1946/02/23  Transition of [facility]) CM/SW Contact:    Carles Collet, RN Phone Number: 11/28/2022, 1:49 PM  Clinical Narrative:                  Patient admitted w metastatic cancer and unresectable mass.  TOC w continue to follow.  Would recommend palliative consult for GOC or TOC consult to arrange palliative/ hospice care. Will defer speaking with patient and wife about this at this time as I am unsure of patient and family's understanding of diagnosis and prognosis.   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] Patient Active Problem List   Diagnosis Date Noted   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[place]) 11/27/2022   Family history of pancreatic cancer 10/17/2022   Family history of prostate cancer 10/17/2022   Esophagitis 10/02/2022   Leukocytosis 10/02/2022   Hyperglycemia 10/02/2022   Aortic atherosclerosis ([place]) 10/02/2022   Bilateral cataracts 10/02/2022   Glaucoma 10/02/2022   Genetic testing 09/06/2022   Choledocholithiasis 07/23/2022   Elevated CA 19-9 level 07/23/2022   H/O prostate cancer 07/23/2022   Cancer of pancreas, body ([place]) 07/17/2022   Generalized abdominal pain 03/20/2022   Hyperlipidemia 09/06/2021   Abnormal white blood cell (WBC) count  09/16/2019   Hx of inguinal hernia surgery 03/12/2015   Kidney stone 06/24/2013   DJD (degenerative joint disease) 02/21/2011   OSTEOPENIA 03/24/2009   GERD 02/22/2009   PROSTATE CANCER 12/26/2006   PCP:  [name], MD Pharmacy:   [facility] [address], [place] - [address] [place] [place] [address] Phone: [phone] Fax: [place] [address] Phone: [phone] Fax: [phone]     Social Determinants of Health (SDOH) Social History: [place]: No Food Insecurity (10/02/2022)  Housing: Low Risk  (10/02/2022)  Transportation Needs: No Transportation Needs (10/02/2022)  Utilities: Not At Risk (10/02/2022)  Depression (PHQ2-9): Low Risk  (10/16/2022)  Financial Resource Strain: Low Risk  (07/26/2022)  Social Connections: Socially Integrated (07/26/2022)  Tobacco Use: Medium Risk (11/28/2022)   SDOH Interventions:     Readmission Risk Interventions     No data to display

## 2022-11-28 NOTE — Care Plan (Signed)
Patient ambulated around nursing unit with NT assisted with walker.  Patient gait steady.  NADN. Patient tolerated activity well. Edythe Clarity, RN

## 2022-11-29 ENCOUNTER — Inpatient Hospital Stay (HOSPITAL_COMMUNITY): Payer: Medicare HMO

## 2022-11-29 LAB — GLUCOSE, CAPILLARY
Glucose-Capillary: 162 mg/dL — ABNORMAL HIGH (ref 70–99)
Glucose-Capillary: 126 mg/dL — ABNORMAL HIGH (ref 70–99)
Glucose-Capillary: 106 mg/dL — ABNORMAL HIGH (ref 70–99)
Glucose-Capillary: 146 mg/dL — ABNORMAL HIGH (ref 70–99)

## 2022-11-29 LAB — SURGICAL PATHOLOGY

## 2022-11-29 MED ORDER — LIDOCAINE HCL (PF) 1 % IJ SOLN
10.0000 mL | Freq: Once | INTRAMUSCULAR | Status: AC
  Administered 2022-11-29: 10 mL

## 2022-11-29 MED ORDER — MIDAZOLAM HCL 2 MG/2ML IJ SOLN
INTRAMUSCULAR | Status: AC
  Filled 2022-11-29: qty 2

## 2022-11-29 MED ORDER — MIDAZOLAM HCL 2 MG/2ML IJ SOLN
INTRAMUSCULAR | Status: AC | PRN
  Administered 2022-11-29: 1 mg via INTRAVENOUS

## 2022-11-29 MED ORDER — METHYLPREDNISOLONE ACETATE 40 MG/ML INJ SUSP (RADIOLOG: 120.0000 mg | Freq: Once | INTRAMUSCULAR | Status: DC

## 2022-11-29 MED ORDER — BUPIVACAINE HCL (PF) 0.25 % IJ SOLN
30.0000 mL | Freq: Once | INTRAMUSCULAR | Status: DC
  Filled 2022-11-29: qty 30

## 2022-11-29 MED ORDER — CALCIUM CARBONATE ANTACID 500 MG PO CHEW
800.0000 mg | CHEWABLE_TABLET | Freq: Every day | ORAL | Status: DC
  Administered 2022-12-01 – 2022-12-02 (×2): 800 mg via ORAL
  Filled 2022-11-29 (×3): qty 4

## 2022-11-29 MED ORDER — BUPIVACAINE HCL 0.25 % IJ SOLN
30.0000 mL | Freq: Once | INTRAMUSCULAR | Status: DC
  Filled 2022-11-29: qty 30

## 2022-11-29 MED ORDER — CALCIUM CARBONATE ANTACID 500 MG PO CHEW
1200.0000 mg | CHEWABLE_TABLET | Freq: Every day | ORAL | Status: DC
  Administered 2022-12-01 – 2022-12-03 (×3): 1200 mg via ORAL
  Filled 2022-11-29 (×3): qty 6

## 2022-11-29 MED ORDER — FENTANYL CITRATE (PF) 100 MCG/2ML IJ SOLN
INTRAMUSCULAR | Status: AC | PRN
  Administered 2022-11-29: 25 ug via INTRAVENOUS

## 2022-11-29 MED ORDER — FENTANYL CITRATE (PF) 100 MCG/2ML IJ SOLN
INTRAMUSCULAR | Status: AC
  Filled 2022-11-29: qty 2

## 2022-11-29 MED ORDER — IOHEXOL 300 MG/ML  SOLN
50.0000 mL | Freq: Once | INTRAMUSCULAR | Status: AC | PRN
  Administered 2022-11-29: 5 mL

## 2022-11-29 MED ORDER — CALCIUM CARBONATE ANTACID 500 MG PO CHEW: 1200.0000 mg | CHEWABLE_TABLET | Freq: Every day | ORAL | Status: DC

## 2022-11-29 NOTE — Progress Notes (Signed)
2 Days Post-Op   Subjective/Chief Complaint: Pain ok.     Objective: Vital signs in last 24 hours: Temp:  [98 F (36.7 C)-99.1 F (37.3 C)] 99.1 F (37.3 C) (02/01 0740) Pulse Rate:  [66-85] 74 (02/01 0740) Resp:  [16-17] 17 (02/01 0740) BP: (93-115)/(71-87) 105/71 (02/01 0740) SpO2:  [96 %-99 %] 98 % (02/01 0740)    Intake/Output from previous day: 01/31 0701 - 02/01 0700 In: 439.1 [I.V.:439.1] Out: 1000 [Urine:1000] Intake/Output this shift: No intake/output data recorded.  General appearance: alert, cooperative, and no distress Resp: breathing comfortably GI: soft, non distended, approp tender. Incision c/d/I.   Extremities: extremities normal, atraumatic, no cyanosis or edema  Lab Results:  Recent Labs    11/28/22 1049  WBC 8.0  HGB 12.6*  HCT 38.8*  PLT 168   BMET Recent Labs    11/28/22 1049  NA 134*  K 4.4  CL 100  CO2 25  GLUCOSE 115*  BUN 6*  CREATININE 0.83  CALCIUM 8.9   PT/INR No results for input(s): "LABPROT", "INR" in the last 72 hours. ABG No results for input(s): "PHART", "HCO3" in the last 72 hours.  Invalid input(s): "PCO2", "PO2"  Studies/Results: No results found.  Anti-infectives: Anti-infectives (From admission, onward)    Start     Dose/Rate Route Frequency Ordered Stop   11/27/22 1730  ceFAZolin (ANCEF) IVPB 2g/100 mL premix        2 g 200 mL/hr over 30 Minutes Intravenous Every 8 hours 11/27/22 1403 11/27/22 1830   11/27/22 0715  ceFAZolin (ANCEF) IVPB 2g/100 mL premix        2 g 200 mL/hr over 30 Minutes Intravenous On call to O.R. 11/27/22 0709 11/27/22 0937       Assessment/Plan: s/p Procedure(s) with comments: DIAGNOSTIC LAPAROSCOPY (N/A) - EPIDURAL EXPLORATORY LAPAROTOMY (N/A) INTRAOPERATIVE ULTRASOUND (N/A) Discussed with IR.  Celiac block planned today. Will eat and mobilize post procedure. Anticipate d/c tomorrow if no unexpected issues.     LOS: 2 days    Stark Klein 11/29/2022

## 2022-11-29 NOTE — Procedures (Signed)
Interventional Radiology Procedure Note  Procedure: CT guided celiac plexus nerve block  Findings: Please refer to procedural dictation for full description. Single 22 ga chiba, anterior approach.  5 mL 0.5% bupivicaine, 80 mg depomedrol  Complications: None immediate  Estimated Blood Loss: < 5 mL  Recommendations: Monitor pain response.  May be candidate for neurolysis if responds well.  IR will arrange for 2 week outpatient follow up.   Ruthann Cancer, MD

## 2022-11-29 NOTE — Discharge Instructions (Signed)
[facility]      [facility] ?[phone] ? ?ABDOMINAL SURGERY: POST OP INSTRUCTIONS ? ?Always review your discharge instruction sheet given to you by the facility where your surgery was performed. ? ?IF YOU HAVE DISABILITY OR FAMILY LEAVE FORMS, YOU MUST BRING THEM TO THE OFFICE FOR PROCESSING.  PLEASE DO NOT GIVE THEM TO YOUR DOCTOR. ? ?A prescription for pain medication may be given to you upon discharge.  Take your pain medication as prescribed, if needed.  If narcotic pain medicine is not needed, then you may take acetaminophen (Tylenol) or ibuprofen (Advil) as needed. ?Take your usually prescribed medications unless otherwise directed. ?If you need a refill on your pain medication, please contact your pharmacy. They will contact our office to request authorization.  Prescriptions will not be filled after 5pm or on week-ends. ?You should follow a light diet the first few days after arrival home, such as soup and crackers, pudding, etc.unless your doctor has advised otherwise. A high-fiber, low fat diet can be resumed as tolerated.   Be sure to include lots of fluids daily. Most patients will experience some swelling and bruising on the chest and neck area.  Ice packs will help.  Swelling and bruising can take several days to resolve ?Most patients will experience some swelling and bruising in the area of the incision. Ice pack will help. Swelling and bruising can take several days to resolve..  ?It is common to experience some constipation if taking pain medication after surgery.  Increasing fluid intake and taking a stool softener will usually help or prevent this problem from occurring.  A mild laxative (Milk of Magnesia or Miralax) should be taken according to package directions if there are no bowel movements after 48 hours. ? You may have steri-strips (small skin tapes) in place directly over the incision.  These strips should be left on the skin for 10-14 days.  If your surgeon used skin glue on  the incision, you may shower in 48 hours.  The glue will flake off over the next 2-3 weeks.  Any sutures or staples will be removed at the office during your follow-up visit. You may find that a light gauze bandage over your incision may keep your staples from being rubbed or pulled. You may shower and replace the bandage daily. ?ACTIVITIES:  You may resume regular (light) daily activities beginning the next day--such as daily self-care, walking, climbing stairs--gradually increasing activities as tolerated.  You may have sexual intercourse when it is comfortable.  Refrain from any heavy lifting or straining until approved by your doctor. ?You may drive when you no longer are taking prescription pain medication, you can comfortably wear a seatbelt, and you can safely maneuver your car and apply brakes ?Return to Work: __________8 weeks if applicable_________________________ ?You should see your doctor in the office for a follow-up appointment approximately two weeks after your surgery.  Make sure that you call for this appointment within a day or two after you arrive home to insure a convenient appointment time. ?OTHER INSTRUCTIONS:  ?_____________________________________________________________ ?_____________________________________________________________ ? ?WHEN TO CALL YOUR DOCTOR: ?Fever over 101.0 ?Inability to urinate ?Nausea and/or vomiting ?Extreme swelling or bruising ?Continued bleeding from incision. ?Increased pain, redness, or drainage from the incision. ?Difficulty swallowing or breathing ?Muscle cramping or spasms. ?Numbness or tingling in hands or feet or around lips. ? ?The clinic staff is available to answer your questions during regular business hours.  Please don?t hesitate to call and ask to speak to   one of the nurses if you have concerns. ? ?For further questions, please visit [email or web address] ? ? ?

## 2022-11-29 NOTE — Progress Notes (Signed)
   I have seen pt today Discussed Celiac plexus block with pt and family They are aware of risks and benefits including but not limited to: infection; bleeding; damage to surrounding structures Agreeable to proceed Consent signed and in chart

## 2022-11-29 NOTE — Discharge Summary (Signed)
Physician Discharge Summary  Patient ID: Jared [name] MRN: [id number] DOB/AGE: [birth year] 78 y.o.  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[place]) 11/27/2022   Family history of pancreatic cancer 10/17/2022   Family history of prostate cancer 10/17/2022   Esophagitis 10/02/2022   Leukocytosis 10/02/2022   Hyperglycemia 10/02/2022   Aortic atherosclerosis ([place]) 10/02/2022   Bilateral cataracts 10/02/2022   Glaucoma 10/02/2022   Genetic testing 09/06/2022   Choledocholithiasis 07/23/2022   Elevated CA 19-9 level 07/23/2022   H/O prostate cancer 07/23/2022   Cancer of pancreas, body ([place]) 07/17/2022   Generalized abdominal pain 03/20/2022   Hyperlipidemia 09/06/2021   Abnormal white blood cell (WBC) count 09/16/2019   Hx of inguinal hernia surgery 03/12/2015   Kidney stone 06/24/2013   DJD (degenerative joint disease) 02/21/2011   OSTEOPENIA 03/24/2009   GERD 02/22/2009   PROSTATE CANCER 12/26/2006    Discharge Diagnoses:  Principal Problem:   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  Discharged Condition: stable  Hospital Course:  Pt was admitted to the floor following dx laparoscopy/abd exploration 11/27/2022.  His tumor was found to be unresectable based on metastatic implants to omentum, mesentery and stomach.  Biopsy was sent from the omental nodule.  He mobilized well and was able to eat and have bowel function.  His pain had become intolerable even pre op and he had been on 4 mg dilaudid 3-4 times per day.  IR was consulted for celiac block.  He had this on POD 2.  Dr. [name] of oncology was notified as well for coordination of post op strategy for treatment.    He had worse pain the morning after celiac block and fentanyl patch was started.  This improved his pain.  He was ambulating, having BMs, increasing his diet  and stable for d/c.    Consults:  IR.  Significant Diagnostic Studies: labs: CBC, CMET with Na 134, Gluc 115, HCT 38.8.    Treatments: surgery: see above.  Also celiac block 11/29/22.   Discharge Exam: Blood pressure 106/77, pulse (!) 53, temperature 98.1 F (36.7 C), temperature source Oral, resp. rate 18, height 6' (1.829 m), weight 98.3 kg, SpO2 100 %. General appearance: alert, cooperative, and no distress Resp: breathing comfortably GI: soft, non distended, approp tender.  Incision c/d/I.   Disposition: Discharge disposition: 01-Home or Self Care       Discharge Instructions     Call MD for:  difficulty breathing, headache or visual disturbances   Complete by: As directed    Call MD for:  difficulty breathing, headache or visual disturbances   Complete by: As directed    Call MD for:  hives   Complete by: As directed    Call MD for:  hives   Complete by: As directed    Call MD for:  persistant nausea and vomiting   Complete by: As directed    Call MD for:  persistant nausea and vomiting   Complete by: As directed    Call MD for:  redness, tenderness, or signs of infection (pain, swelling, redness, odor or green/yellow discharge around incision site)   Complete by: As directed    Call MD for:  redness, tenderness, or signs of infection (pain, swelling, redness, odor or green/yellow discharge around incision site)   Complete by: As directed    Call MD for:  severe uncontrolled pain   Complete by: As directed    Call MD for:  severe uncontrolled pain   Complete by: As directed    Call MD for:  temperature >100.4   Complete by: As directed    Call MD for:  temperature >100.4   Complete by: As directed    Diet - low sodium heart healthy   Complete by: As directed    Diet - low sodium heart healthy   Complete by: As directed    Increase activity slowly   Complete by: As directed    Increase activity slowly   Complete by: As directed       Allergies as of  12/03/2022       Reactions   Claritin-d 24 Hour [loratadine-pseudoephedrine Er] Other (See Comments)   Eye pressure issues   Lupron [leuprolide] Itching, Other (See Comments)   Caused infection on hip         Medication List     TAKE these medications    acetaminophen 325 MG tablet Commonly known as: TYLENOL Take 325 mg by mouth daily as needed for headache.   ascorbic acid 500 MG tablet Commonly known as: VITAMIN C Take 500 mg by mouth daily.   [place] 0.65 % nasal spray Generic drug: sodium chloride Place 2-3 sprays into the nose as needed for congestion.   brimonidine 0.15 % ophthalmic solution Commonly known as: ALPHAGAN Place 1 drop into the left eye in the morning and at bedtime.   cholecalciferol 25 MCG (1000 UNIT) tablet Commonly known as: VITAMIN D3 Take 1,000 Units by mouth daily.   COLACE PO Take 1 tablet by mouth daily at 12 noon.   Dorzolamide HCl-Timolol Mal PF 2-0.5 % Soln Place 1 drop into the left eye 2 (two) times daily.   DULCOLAX PO Take 1 tablet by mouth daily.   fentaNYL 50 MCG/HR Commonly known as: [place] 1 patch onto the skin every 3 (three) days.   fluconazole 100 MG tablet Commonly known as: DIFLUCAN Take 1 tablet (100 mg total) by mouth daily.   Garlic 8299 MG Caps Take 1,000 mg by mouth daily.   Gemtesa 75 MG Tabs Generic drug: Vibegron Take 75 mg by mouth daily.   HYDROmorphone 4 MG tablet Commonly known as: Dilaudid Take 1-2 tablets (4-8 mg total) by mouth every 4 (four) hours as needed for severe pain.   lidocaine-prilocaine cream Commonly known as: EMLA Apply 1 Application topically as needed.   olopatadine 0.1 % ophthalmic solution Commonly known as: PATANOL Place 1 drop into both eyes as needed for allergies.   ondansetron 8 MG tablet Commonly known as: ZOFRAN Take 1 tablet (8 mg total) by mouth every 8 (eight) hours as needed for nausea or vomiting (Starting day 3 after chemo as needed for nausea(received  Aloxi with chemo)).   pantoprazole 40 MG tablet Commonly known as: PROTONIX Take 1 tablet (40 mg total) by mouth 2 (two) times daily.   potassium chloride SA 20 MEQ tablet Commonly known as: KLOR-CON M Take 1 tablet (20 mEq total) by mouth daily.   prochlorperazine 10 MG tablet Commonly known as: COMPAZINE Take 1 tablet (10 mg total) by mouth every 6 (six) hours as needed for nausea or vomiting.   Refresh Optive PF 0.5-0.9 % Soln Generic drug: Carboxymethylcell-Glycerin PF Place 1 drop into both eyes as needed (dry eyes).   sucralfate 1 g tablet Commonly known as: CARAFATE Dissolve 1 tablet in 75m of water and mix  into a slurry. Then take slurry by mouth every 6 (six) hours.   tiZANidine 4 MG tablet Commonly known as: ZANAFLEX Take 1 tablet (4 mg total) by mouth every 8 (eight) hours as needed for muscle spasms.   triptorelin 11.25 MG injection Commonly known as: TRELESTAR LA Inject 11.25 mg into the muscle as needed (PSA).   TUMS ULTRA 1000 PO Take 2,000-3,000 mg by mouth See admin instructions. 3000 mg by mouth in the morning and 2000 mg by mouth at night        Follow-up Information     Stark Klein, MD Follow up in 2 week(s).   Specialty: General Surgery Contact information: [place] [place] [address] [phone]         Suzette Battiest, MD Follow up.   Specialties: Interventional Radiology, Diagnostic Radiology, Radiology Why: Expect call from schedulers with follow-up information. Contact information: [address] [place] [address] [phone]                 Signed: Stark Klein 12/03/2022, 1:42 PM

## 2022-11-30 LAB — CBC WITH DIFFERENTIAL/PLATELET
Abs Immature Granulocytes: 0.02 10*3/uL (ref 0.00–0.07)
Basophils Absolute: 0 10*3/uL (ref 0.0–0.1)
Basophils Relative: 1 %
Eosinophils Absolute: 0.1 10*3/uL (ref 0.0–0.5)
Eosinophils Relative: 2 %
HCT: 37.7 % — ABNORMAL LOW (ref 39.0–52.0)
Hemoglobin: 12.3 g/dL — ABNORMAL LOW (ref 13.0–17.0)
Immature Granulocytes: 0 %
Lymphocytes Relative: 10 %
Lymphs Abs: 0.5 10*3/uL — ABNORMAL LOW (ref 0.7–4.0)
MCH: 29.3 pg (ref 26.0–34.0)
MCHC: 32.6 g/dL (ref 30.0–36.0)
MCV: 89.8 fL (ref 80.0–100.0)
Monocytes Absolute: 0.6 10*3/uL (ref 0.1–1.0)
Monocytes Relative: 12 %
Neutro Abs: 4 10*3/uL (ref 1.7–7.7)
Neutrophils Relative %: 75 %
Platelets: 173 10*3/uL (ref 150–400)
RBC: 4.2 MIL/uL — ABNORMAL LOW (ref 4.22–5.81)
RDW: 13.5 % (ref 11.5–15.5)
WBC: 5.3 10*3/uL (ref 4.0–10.5)
nRBC: 0 % (ref 0.0–0.2)

## 2022-11-30 LAB — BASIC METABOLIC PANEL
Anion gap: 8 (ref 5–15)
BUN: 9 mg/dL (ref 8–23)
CO2: 27 mmol/L (ref 22–32)
Calcium: 8.6 mg/dL — ABNORMAL LOW (ref 8.9–10.3)
Chloride: 102 mmol/L (ref 98–111)
Creatinine, Ser: 0.66 mg/dL (ref 0.61–1.24)
GFR, Estimated: >60 mL/min (ref >60)
Glucose, Bld: 155 mg/dL — ABNORMAL HIGH (ref 70–99)
Potassium: 4 mmol/L (ref 3.5–5.1)
Sodium: 137 mmol/L (ref 135–145)

## 2022-11-30 LAB — BPAM RBC
Blood Product Expiration Date: 202403042359
Blood Product Expiration Date: 202403042359
ISSUE DATE / TIME: 202401291744
Unit Type and Rh: 5100
Unit Type and Rh: 5100

## 2022-11-30 LAB — GLUCOSE, CAPILLARY
Glucose-Capillary: 126 mg/dL — ABNORMAL HIGH (ref 70–99)
Glucose-Capillary: 151 mg/dL — ABNORMAL HIGH (ref 70–99)
Glucose-Capillary: 121 mg/dL — ABNORMAL HIGH (ref 70–99)
Glucose-Capillary: 145 mg/dL — ABNORMAL HIGH (ref 70–99)

## 2022-11-30 LAB — TYPE AND SCREEN
ABO/RH(D): O POS
Antibody Screen: NEGATIVE
Unit division: 0
Unit division: 0

## 2022-11-30 MED ORDER — FENTANYL 50 MCG/HR TD PT72: 1.0000 | MEDICATED_PATCH | TRANSDERMAL | 0 refills | Status: DC

## 2022-11-30 MED ORDER — FENTANYL 50 MCG/HR TD PT72
1.0000 | MEDICATED_PATCH | TRANSDERMAL | Status: DC
  Administered 2022-11-30 – 2022-12-03 (×2): 1 via TRANSDERMAL
  Filled 2022-11-30 (×2): qty 1

## 2022-11-30 MED ORDER — TIZANIDINE HCL 4 MG PO TABS: 4.0000 mg | ORAL_TABLET | Freq: Three times a day (TID) | ORAL | 0 refills | Status: DC | PRN

## 2022-11-30 MED ORDER — HYDROMORPHONE HCL 4 MG PO TABS: 4.0000 mg | ORAL_TABLET | ORAL | 0 refills | Status: DC | PRN

## 2022-11-30 NOTE — Progress Notes (Signed)
Mobility Specialist Progress Note   11/30/22 1203  Mobility  Activity Ambulated with assistance in hallway  Level of Assistance Modified independent, requires aide device or extra time  Assistive Device Front wheel walker  Distance Ambulated (ft) 550 ft  Activity Response Tolerated well  Mobility Referral Yes  $Mobility charge 1 Mobility   Post Mobility: 71 HR, 92% SpO2  Patient received in bed having no c/o pain after taking meds and agreeable to participate in mobility. Ambulated in hallway with a steady gait but requiring min cues on posture, pt tends to go into hip flexion as ambulation progressed. Returned to room w/ pt slightly dyspneic but SpO2 reading 92%. Was left in chair with all needs met, call bell in reach.   Holland Falling Mobility Specialist Please contact via SecureChat or  Rehab office at [phone]

## 2022-11-30 NOTE — Progress Notes (Signed)
3 Days Post-Op   Subjective/Chief Complaint: Pain worse today.  Still hungry, but very uncomfortably.  No n/v. Having BMs. Family concerned about intermittent confusion.       Objective: Vital signs in last 24 hours: Temp:  [98 F (36.7 C)-99.4 F (37.4 C)] 98.3 F (36.8 C) (02/02 0808) Pulse Rate:  [57-73] 63 (02/02 0808) Resp:  [18-22] 20 (02/02 0808) BP: (104-131)/(70-86) 125/83 (02/02 0808) SpO2:  [96 %-100 %] 99 % (02/02 0808) Last BM Date : 11/29/22  Intake/Output from previous day: No intake/output data recorded. Intake/Output this shift: No intake/output data recorded.  General appearance: alert, cooperative, and looks uncomfortable Resp: no respiratory distress GI: soft, non distended, Incision c/d/I.  Tender, more so than yesterday.  No rebound or guarding.   Extremities: extremities normal, atraumatic, no cyanosis or edema  Lab Results:  Recent Labs    11/28/22 1049  WBC 8.0  HGB 12.6*  HCT 38.8*  PLT 168   BMET Recent Labs    11/28/22 1049  NA 134*  K 4.4  CL 100  CO2 25  GLUCOSE 115*  BUN 6*  CREATININE 0.83  CALCIUM 8.9   PT/INR No results for input(s): "LABPROT", "INR" in the last 72 hours. ABG No results for input(s): "PHART", "HCO3" in the last 72 hours.  Invalid input(s): "PCO2", "PO2"  Studies/Results: CT CELIAC PLEXUS BLOCK ANESTHETIC  Result Date: 11/29/2022 CLINICAL DATA:  77 year old male with history of pancreatic cancer, abdominal pain. EXAM: CT GUIDED NERVE BLOCK OF THE CELIAC AXIS ANESTHESIA/SEDATION: Moderate (conscious) sedation was employed during this procedure. A total of Versed 1 mg and Fentanyl 25 mcg was administered intravenously. Moderate Sedation Time: 19 minutes. The patient's level of consciousness and vital signs were monitored continuously by radiology nursing throughout the procedure under my direct supervision. PROCEDURE: The procedure risks, benefits, and alternatives were explained to the patient. Questions  regarding the procedure were encouraged and answered. The patient understands and consents to the procedure. The anterior abdominal wall was prepped with chlorhexidine in a sterile fashion, and a sterile drape was applied covering the operative field. A sterile gown and sterile gloves were used for the procedure. Local anesthesia was provided with 1% Lidocaine. A 22 gauge Chiba needle was advanced under CT guidance to the level of the celiac plexus. After confirming needle tip position, approximately three ml of a 1:10 dilution of Omnipaque-300 contrast and saline was injected. Spread of diluted contrast material was confirmed by CT. A mixture of 5 ml of 0.5% bupivicaine and 80 mg Depo-Medrol was then made. This was injected through the needle. The needle was removed. Hemostasis was achieved with brief manual compression. Limited abdominal completion CT was performed. COMPLICATIONS: None FINDINGS: Contrast injection showed excellent spread across the midline at the level of the celiac plexus. IMPRESSION: Technically successful CT guided therapeutic celiac plexus nerve block. PLAN: The patient will be followed up in Interventional Radiology outpatient clinic in 2 weeks. Consideration for neurolysis will be discussed at that time. Ruthann Cancer, MD [facility] Radiology Electronically Signed   By: Ruthann Cancer M.D.   On: 11/29/2022 20:37    Anti-infectives: Anti-infectives (From admission, onward)    Start     Dose/Rate Route Frequency Ordered Stop   11/27/22 1730  ceFAZolin (ANCEF) IVPB 2g/100 mL premix        2 g 200 mL/hr over 30 Minutes Intravenous Every 8 hours 11/27/22 1403 11/27/22 1830   11/27/22 0715  ceFAZolin (ANCEF) IVPB 2g/100  mL premix        2 g 200 mL/hr over 30 Minutes Intravenous On call to O.R. 11/27/22 7062 11/27/22 0937       Assessment/Plan: s/p Procedure(s) with comments: DIAGNOSTIC LAPAROSCOPY (N/A) - EPIDURAL EXPLORATORY  LAPAROTOMY (N/A) INTRAOPERATIVE ULTRASOUND (N/A) Got celiac block yesterday. Having more pain today. Will try fentanyl patch and continue prn dilaudid.    Discussed hospital delirium with family.  I think this is likely related to the amount of narcotics that he is requiring, but I also discussed sleep hygiene and keeping lights low at night and on during the day.    Definitely not ready for dischargetoday.       LOS: 3 days    Stark Klein 11/30/2022

## 2022-11-30 NOTE — Care Management Important Message (Signed)
Important Message  Patient Details  Name: Jared [name] MRN: [id number] Date of Birth: [birth year]   Medicare Important Message Given:  Yes     [name] 11/30/2022, 1:54 PM

## 2022-11-30 NOTE — Progress Notes (Signed)
Referring Physician(s): Dr. Stark Klein  Supervising Physician: Aletta Edouard  Patient Status:  [facility] - In-pt  Chief Complaint: Abdominal pain  Pancreatic cancer  Subjective: Patient lying in bed accompanied by family.  Alert and clear mentation at time of visit.  Reports no change in pain since procedure yesterday.  Continues to require narcotic pain medication q4 hrs and is only able to eat with peak medication effect.   Allergies: Claritin-d 24 hour [loratadine-pseudoephedrine er] and Lupron [leuprolide]  Medications: Prior to Admission medications   Medication Sig Start Date End Date Taking? Authorizing Provider  Ascorbic Acid (VITAMIN C) 500 MG tablet Take 500 mg by mouth daily.     Yes [provider]  brimonidine (ALPHAGAN) 0.15 % ophthalmic solution Place 1 drop into the left eye in the morning and at bedtime.   Yes [provider]  Calcium Carbonate Antacid (TUMS ULTRA 1000 PO) Take 2,000-3,000 mg by mouth See admin instructions. 3000 mg by mouth in the morning and 2000 mg by mouth at night   Yes [provider]  Carboxymethylcell-Glycerin PF (REFRESH OPTIVE PF) 0.5-0.9 % SOLN Place 1 drop into both eyes as needed (dry eyes). 04/28/18  Yes [provider]  Docusate Sodium (COLACE PO) Take 1 tablet by mouth daily at 12 noon. 11/15/22  Yes [provider]  Dorzolamide HCl-Timolol Mal PF 2-0.5 % SOLN Place 1 drop into the left eye 2 (two) times daily. 12/01/21  Yes [provider]  fluconazole (DIFLUCAN) 100 MG tablet Take 1 tablet (100 mg total) by mouth daily. 11/19/22  Yes Ladell Pier, MD  Garlic 8101 MG CAPS Take 1,000 mg by mouth daily.   Yes [provider]  GEMTESA 75 MG TABS Take 75 mg by mouth daily. 01/31/22  Yes [provider]  lidocaine-prilocaine (EMLA) cream Apply 1 Application topically as needed. 07/27/22  Yes Ladell Pier, MD  Magnesium Hydroxide (DULCOLAX PO) Take 1 tablet by mouth  daily. 11/15/22  Yes [provider]  olopatadine (PATANOL) 0.1 % ophthalmic solution Place 1 drop into both eyes as needed for allergies.   Yes [provider]  ondansetron (ZOFRAN) 8 MG tablet Take 1 tablet (8 mg total) by mouth every 8 (eight) hours as needed for nausea or vomiting (Starting day 3 after chemo as needed for nausea(received Aloxi with chemo)). 09/10/22  Yes Ladell Pier, MD  pantoprazole (PROTONIX) 40 MG tablet Take 1 tablet (40 mg total) by mouth 2 (two) times daily. 10/07/22  Yes Eugenie Filler, MD  potassium chloride SA (KLOR-CON M) 20 MEQ tablet Take 1 tablet (20 mEq total) by mouth daily. 09/26/22  Yes Ladell Pier, MD  prochlorperazine (COMPAZINE) 10 MG tablet Take 1 tablet (10 mg total) by mouth every 6 (six) hours as needed for nausea or vomiting. 10/07/22  Yes Eugenie Filler, MD  sodium chloride (AYR) 0.65 % nasal spray Place 2-3 sprays into the nose as needed for congestion.   Yes [provider]  sucralfate (CARAFATE) 1 g tablet Dissolve 1 tablet in 58m of water and mix into a slurry. Then take slurry by mouth every 6 (six) hours. 10/07/22  Yes TEugenie Filler MD  acetaminophen (TYLENOL) 325 MG tablet Take 325 mg by mouth daily as needed for headache.    [provider]  cholecalciferol (VITAMIN D3) 25 MCG (1000 UNIT) tablet Take 1,000 Units by mouth daily.    [provider]  fentaNYL (DURAGESIC) 50 MCG/HR Place  1 patch onto the skin every 3 (three) days. 11/30/22 12/30/22  Stark Klein, MD  HYDROmorphone (DILAUDID) 4 MG tablet Take 1-2 tablets (4-8 mg total) by mouth every 4 (four) hours as needed for severe pain. 11/30/22   Stark Klein, MD  tiZANidine (ZANAFLEX) 4 MG tablet Take 1 tablet (4 mg total) by mouth every 8 (eight) hours as needed for muscle spasms. 11/30/22   Stark Klein, MD  Triptorelin Pamoate 11.25 MG SUSR Inject 11.25 mg into the muscle as needed (PSA).    [provider]     Vital  Signs: BP (!) 132/92 (BP Location: Left Arm)   Pulse 65   Temp 98.3 F (36.8 C) (Oral)   Resp 18   Ht 6' (1.829 m)   Wt 231 lb (104.8 kg)   SpO2 100%   BMI 31.33 kg/m   Physical Exam NAD, alert Abdomen: Site assessed.  Minute puncture intact.  Bandaid replaced.  Generalized tenderness related to surgical wound.   Imaging: CT CELIAC PLEXUS BLOCK ANESTHETIC  Result Date: 11/29/2022 CLINICAL DATA:  77 year old male with history of pancreatic cancer, abdominal pain. EXAM: CT GUIDED NERVE BLOCK OF THE CELIAC AXIS ANESTHESIA/SEDATION: Moderate (conscious) sedation was employed during this procedure. A total of Versed 1 mg and Fentanyl 25 mcg was administered intravenously. Moderate Sedation Time: 19 minutes. The patient's level of consciousness and vital signs were monitored continuously by radiology nursing throughout the procedure under my direct supervision. PROCEDURE: The procedure risks, benefits, and alternatives were explained to the patient. Questions regarding the procedure were encouraged and answered. The patient understands and consents to the procedure. The anterior abdominal wall was prepped with chlorhexidine in a sterile fashion, and a sterile drape was applied covering the operative field. A sterile gown and sterile gloves were used for the procedure. Local anesthesia was provided with 1% Lidocaine. A 22 gauge Chiba needle was advanced under CT guidance to the level of the celiac plexus. After confirming needle tip position, approximately three ml of a 1:10 dilution of Omnipaque-300 contrast and saline was injected. Spread of diluted contrast material was confirmed by CT. A mixture of 5 ml of 0.5% bupivicaine and 80 mg Depo-Medrol was then made. This was injected through the needle. The needle was removed. Hemostasis was achieved with brief manual compression. Limited abdominal completion CT was performed. COMPLICATIONS: None FINDINGS: Contrast injection showed excellent spread across  the midline at the level of the celiac plexus. IMPRESSION: Technically successful CT guided therapeutic celiac plexus nerve block. PLAN: The patient will be followed up in Interventional Radiology outpatient clinic in 2 weeks. Consideration for neurolysis will be discussed at that time. Ruthann Cancer, MD [facility] and Interventional Radiology Specialists [facility] [address] Radiology Electronically Signed   By: Ruthann Cancer M.D.   On: 11/29/2022 20:37    Labs:  CBC: Recent Labs    10/07/22 1012 11/22/22 0941 11/28/22 1049 11/30/22 1049  WBC 10.2 5.2 8.0 5.3  HGB 12.1* 13.7 12.6* 12.3*  HCT 36.5* 42.9 38.8* 37.7*  PLT 181 152 168 173    COAGS: Recent Labs    10/02/22 0655 11/22/22 0941  INR  --  1.0  APTT 22*  --     BMP: Recent Labs    10/08/22 0230 10/16/22 1047 11/22/22 0941 11/28/22 1049 11/30/22 1049  NA 140 141 141 134* 137  K 3.5 4.8 4.3 4.4 4.0  CL 108 105 103 100 102  CO2 '26 21 29 25 27  '$ GLUCOSE 107* 136* 159* 115* 155*  BUN 5* 8 10 6* 9  CALCIUM 8.7* 9.5 9.7 8.9 8.6*  CREATININE 0.71 0.91 0.79 0.83 0.66  GFRNONAA >60  --  >60 >60 >60    LIVER FUNCTION TESTS: Recent Labs    10/01/22 1459 10/02/22 0529 10/03/22 0510 11/22/22 0941  BILITOT 0.9 0.8 1.2 0.5  AST 28 33 26 31  ALT 32 37 30 32  ALKPHOS 114 131* 100 72  PROT 5.8* 6.5 5.3* 6.5  ALBUMIN 3.2* 3.7 3.1* 3.9    Assessment and Plan: Abdominal pain in the setting of pancreatic cancer S/p celiac plexus block yesterday afternoon.  Unfortunately no immediate change in pain.  Continues to require round-the-clock pain medication.  No increase in appetite.  Plan made to follow-up with Dr. Serafina Royals in 2 weeks to discuss neurolysis based on response.  Patient and family in agreement.   Electronically Signed: Docia Barrier, PA 11/30/2022, 12:23 PM   I spent a total of 15 Minutes at the the patient's bedside AND on the patient's hospital floor or unit, greater than 50% of which was  counseling/coordinating care for abdominal pain, pancreatic cancer.

## 2022-12-01 LAB — GLUCOSE, CAPILLARY
Glucose-Capillary: 117 mg/dL — ABNORMAL HIGH (ref 70–99)
Glucose-Capillary: 152 mg/dL — ABNORMAL HIGH (ref 70–99)
Glucose-Capillary: 140 mg/dL — ABNORMAL HIGH (ref 70–99)
Glucose-Capillary: 60 mg/dL — ABNORMAL LOW (ref 70–99)
Glucose-Capillary: 101 mg/dL — ABNORMAL HIGH (ref 70–99)
Glucose-Capillary: 132 mg/dL — ABNORMAL HIGH (ref 70–99)

## 2022-12-01 LAB — CBC WITH DIFFERENTIAL/PLATELET
Abs Immature Granulocytes: 0.01 10*3/uL (ref 0.00–0.07)
Basophils Absolute: 0 10*3/uL (ref 0.0–0.1)
Basophils Relative: 1 %
Eosinophils Absolute: 0.1 10*3/uL (ref 0.0–0.5)
Eosinophils Relative: 2 %
HCT: 32.5 % — ABNORMAL LOW (ref 39.0–52.0)
Hemoglobin: 11 g/dL — ABNORMAL LOW (ref 13.0–17.0)
Immature Granulocytes: 0 %
Lymphocytes Relative: 15 %
Lymphs Abs: 0.8 10*3/uL (ref 0.7–4.0)
MCH: 29.9 pg (ref 26.0–34.0)
MCHC: 33.8 g/dL (ref 30.0–36.0)
MCV: 88.3 fL (ref 80.0–100.0)
Monocytes Absolute: 0.7 10*3/uL (ref 0.1–1.0)
Monocytes Relative: 15 %
Neutro Abs: 3.3 10*3/uL (ref 1.7–7.7)
Neutrophils Relative %: 67 %
Platelets: 159 10*3/uL (ref 150–400)
RBC: 3.68 MIL/uL — ABNORMAL LOW (ref 4.22–5.81)
RDW: 13.4 % (ref 11.5–15.5)
WBC: 4.9 10*3/uL (ref 4.0–10.5)
nRBC: 0 % (ref 0.0–0.2)

## 2022-12-01 LAB — BASIC METABOLIC PANEL
Anion gap: 6 (ref 5–15)
BUN: 8 mg/dL (ref 8–23)
CO2: 24 mmol/L (ref 22–32)
Calcium: 8.3 mg/dL — ABNORMAL LOW (ref 8.9–10.3)
Chloride: 104 mmol/L (ref 98–111)
Creatinine, Ser: 0.59 mg/dL — ABNORMAL LOW (ref 0.61–1.24)
GFR, Estimated: >60 mL/min (ref >60)
Glucose, Bld: 121 mg/dL — ABNORMAL HIGH (ref 70–99)
Potassium: 3.6 mmol/L (ref 3.5–5.1)
Sodium: 134 mmol/L — ABNORMAL LOW (ref 135–145)

## 2022-12-01 NOTE — Progress Notes (Signed)
Mobility Specialist: Progress Note   12/01/22 1348  Mobility  Activity Refused mobility   Attempted to see pt x2. First attempt pt bathing and requesting MS to return. Second attempt pt refused mobility secondary to just receiving pain medication and food arriving. Will f/u as able.   [place] Teauna Dubach Mobility Specialist Please contact via SecureChat or Rehab office at [phone]

## 2022-12-01 NOTE — Progress Notes (Signed)
4 Days Post-Op   Subjective/Chief Complaint: Pain better today. Feels like the block is working. Appetite improving. Having bowel movements.       Objective: Vital signs in last 24 hours: Temp:  [98.3 F (36.8 C)-99.1 F (37.3 C)] 99.1 F (37.3 C) (02/03 0718) Pulse Rate:  [54-65] 54 (02/03 0718) Resp:  [14-18] 16 (02/03 0718) BP: (106-132)/(76-92) 123/80 (02/03 0718) SpO2:  [99 %-100 %] 99 % (02/03 0718) Weight:  [98.3 kg] 98.3 kg (02/03 0402) Last BM Date : 12/01/22  Intake/Output from previous day: 02/02 0701 - 02/03 0700 In: 600 [P.O.:600] Out: -  Intake/Output this shift: No intake/output data recorded.  General appearance: alert, cooperative, and looks uncomfortable Resp: no respiratory distress GI: soft, non distended, Incision c/d/I.  Minimal incisional tenderness Extremities: extremities normal, atraumatic, no cyanosis or edema  Lab Results:  Recent Labs    11/30/22 1049 12/01/22 0132  WBC 5.3 4.9  HGB 12.3* 11.0*  HCT 37.7* 32.5*  PLT 173 159    BMET Recent Labs    11/30/22 1049 12/01/22 0132  NA 137 134*  K 4.0 3.6  CL 102 104  CO2 27 24  GLUCOSE 155* 121*  BUN 9 8  CREATININE 0.66 0.59*  CALCIUM 8.6* 8.3*    PT/INR No results for input(s): "LABPROT", "INR" in the last 72 hours. ABG No results for input(s): "PHART", "HCO3" in the last 72 hours.  Invalid input(s): "PCO2", "PO2"  Studies/Results: CT CELIAC PLEXUS BLOCK ANESTHETIC  Result Date: 11/29/2022 CLINICAL DATA:  77 year old male with history of pancreatic cancer, abdominal pain. EXAM: CT GUIDED NERVE BLOCK OF THE CELIAC AXIS ANESTHESIA/SEDATION: Moderate (conscious) sedation was employed during this procedure. A total of Versed 1 mg and Fentanyl 25 mcg was administered intravenously. Moderate Sedation Time: 19 minutes. The patient's level of consciousness and vital signs were monitored continuously by radiology nursing throughout the procedure under my direct supervision. PROCEDURE:  The procedure risks, benefits, and alternatives were explained to the patient. Questions regarding the procedure were encouraged and answered. The patient understands and consents to the procedure. The anterior abdominal wall was prepped with chlorhexidine in a sterile fashion, and a sterile drape was applied covering the operative field. A sterile gown and sterile gloves were used for the procedure. Local anesthesia was provided with 1% Lidocaine. A 22 gauge Chiba needle was advanced under CT guidance to the level of the celiac plexus. After confirming needle tip position, approximately three ml of a 1:10 dilution of Omnipaque-300 contrast and saline was injected. Spread of diluted contrast material was confirmed by CT. A mixture of 5 ml of 0.5% bupivicaine and 80 mg Depo-Medrol was then made. This was injected through the needle. The needle was removed. Hemostasis was achieved with brief manual compression. Limited abdominal completion CT was performed. COMPLICATIONS: None FINDINGS: Contrast injection showed excellent spread across the midline at the level of the celiac plexus. IMPRESSION: Technically successful CT guided therapeutic celiac plexus nerve block. PLAN: The patient will be followed up in Interventional Radiology outpatient clinic in 2 weeks. Consideration for neurolysis will be discussed at that time. Ruthann Cancer, MD Vascular and Interventional Radiology Specialists [facility] Radiology Electronically Signed   By: Ruthann Cancer M.D.   On: 11/29/2022 20:37    Anti-infectives: Anti-infectives (From admission, onward)    Start     Dose/Rate Route Frequency Ordered Stop   11/27/22 1730  ceFAZolin (ANCEF) IVPB 2g/100 mL premix        2 g 200 mL/hr over  30 Minutes Intravenous Every 8 hours 11/27/22 1403 11/27/22 1830   11/27/22 0715  ceFAZolin (ANCEF) IVPB 2g/100 mL premix        2 g 200 mL/hr over 30 Minutes Intravenous On call to O.R. 11/27/22 0709 11/27/22 0937        Assessment/Plan: s/p Procedure(s) with comments: DIAGNOSTIC LAPAROSCOPY (N/A) - EPIDURAL EXPLORATORY LAPAROTOMY (N/A) INTRAOPERATIVE ULTRASOUND (N/A) Got celiac block 2/1, fentanyl patch started yesterday; pain starting to improve.  Had some delirium which seems better this morning- likely related to the amount of narcotics that he is requiring, but I also discussed sleep hygiene and keeping lights low at night and on during the day.    Will likely be ready for discharge tomorrow.        LOS: 4 days    [name] 12/01/2022

## 2022-12-02 LAB — GLUCOSE, CAPILLARY
Glucose-Capillary: 143 mg/dL — ABNORMAL HIGH (ref 70–99)
Glucose-Capillary: 103 mg/dL — ABNORMAL HIGH (ref 70–99)
Glucose-Capillary: 121 mg/dL — ABNORMAL HIGH (ref 70–99)
Glucose-Capillary: 124 mg/dL — ABNORMAL HIGH (ref 70–99)

## 2022-12-02 MED ORDER — CHLORHEXIDINE GLUCONATE CLOTH 2 % EX PADS
6.0000 | MEDICATED_PAD | Freq: Every day | CUTANEOUS | Status: DC
  Administered 2022-12-02 – 2022-12-03 (×2): 6 via TOPICAL

## 2022-12-02 MED ORDER — SODIUM CHLORIDE 0.9% FLUSH: 10.0000 mL | INTRAVENOUS | Status: DC | PRN

## 2022-12-02 NOTE — Progress Notes (Signed)
Mobility Specialist: Progress Note   12/02/22 1435  Mobility  Activity Ambulated with assistance in hallway  Level of Assistance Standby assist, set-up cues, supervision of patient - no hands on  Assistive Device Front wheel walker  Distance Ambulated (ft) 1100 ft  Activity Response Tolerated well  Mobility Referral Yes  $Mobility charge 1 Mobility   Pt received in the chair and agreeable to mobility. Mod I to stand. Cues for upright posture. No c/o throughout. Pt back to the chair after session with family present in the room.   [place] Simone Tuckey Mobility Specialist Please contact via SecureChat or Rehab office at [phone]

## 2022-12-02 NOTE — Progress Notes (Signed)
5 Days Post-Op   Subjective/Chief Complaint: Had a decent night, but is off his medication schedule and a little behind on pain control this morning.      Objective: Vital signs in last 24 hours: Temp:  [98 F (36.7 C)-98.3 F (36.8 C)] 98 F (36.7 C) (02/04 0756) Pulse Rate:  [54-105] 105 (02/04 0756) Resp:  [16-17] 17 (02/04 0756) BP: (104-129)/(77-93) 129/84 (02/04 0756) SpO2:  [99 %-100 %] 100 % (02/04 0756) Last BM Date : 12/02/22  Intake/Output from previous day: No intake/output data recorded. Intake/Output this shift: No intake/output data recorded.  General appearance: alert, cooperative, and looks uncomfortable Resp: no respiratory distress GI: soft, non distended, Incision c/d/I.  Minimal incisional tenderness Extremities: extremities normal, atraumatic, no cyanosis or edema  Lab Results:  Recent Labs    11/30/22 1049 12/01/22 0132  WBC 5.3 4.9  HGB 12.3* 11.0*  HCT 37.7* 32.5*  PLT 173 159    BMET Recent Labs    11/30/22 1049 12/01/22 0132  NA 137 134*  K 4.0 3.6  CL 102 104  CO2 27 24  GLUCOSE 155* 121*  BUN 9 8  CREATININE 0.66 0.59*  CALCIUM 8.6* 8.3*    PT/INR No results for input(s): "LABPROT", "INR" in the last 72 hours. ABG No results for input(s): "PHART", "HCO3" in the last 72 hours.  Invalid input(s): "PCO2", "PO2"  Studies/Results: No results found.  Anti-infectives: Anti-infectives (From admission, onward)    Start     Dose/Rate Route Frequency Ordered Stop   11/27/22 1730  ceFAZolin (ANCEF) IVPB 2g/100 mL premix        2 g 200 mL/hr over 30 Minutes Intravenous Every 8 hours 11/27/22 1403 11/27/22 1830   11/27/22 0715  ceFAZolin (ANCEF) IVPB 2g/100 mL premix        2 g 200 mL/hr over 30 Minutes Intravenous On call to O.R. 11/27/22 0709 11/27/22 0937       Assessment/Plan: s/p Procedure(s) with comments: DIAGNOSTIC LAPAROSCOPY (N/A) - EPIDURAL EXPLORATORY LAPAROTOMY (N/A) INTRAOPERATIVE ULTRASOUND (N/A) Got  celiac block 2/1, fentanyl patch started yesterday; pain starting to improve.  Continue delirium precautions/ sleep hygiene  Patient counseled to try to stick with PO meds for pain. He is not ready for discharge now but will likely be ready for discharge tomorrow.        LOS: 5 days    Jared [name] 12/02/2022

## 2022-12-03 LAB — GLUCOSE, CAPILLARY
Glucose-Capillary: 103 mg/dL — ABNORMAL HIGH (ref 70–99)
Glucose-Capillary: 104 mg/dL — ABNORMAL HIGH (ref 70–99)

## 2022-12-03 MED ORDER — HEPARIN SOD (PORK) LOCK FLUSH 100 UNIT/ML IV SOLN
500.0000 [IU] | INTRAVENOUS | Status: AC | PRN
  Administered 2022-12-03: 500 [IU]

## 2022-12-03 NOTE — Progress Notes (Signed)
IV team de-accessed left chest port, band aid in place, clean, dry, and intact.  Discharge instructions given to the patient, spouse, and daughter.  Education emphasized on signs of infection to watch for as well as pain management.  Patient and his family verbalized understanding.  Encouraged to call the doctor for concerns.  Discharged home.

## 2022-12-03 NOTE — Care Management Important Message (Signed)
Important Message  Patient Details  Name: Jared [name] MRN: [id number] Date of Birth: 08/03/1946   Medicare Important Message Given:  Yes     [name] 12/03/2022, 2:43 PM

## 2022-12-03 NOTE — Progress Notes (Signed)
   12/03/22 1500  Mobility  Activity Ambulated with assistance in hallway  Level of Assistance Modified independent, requires aide device or extra time  Assistive Device Front wheel walker  Distance Ambulated (ft) 1100 ft  Activity Response Tolerated well  Mobility Referral Yes  $Mobility charge 1 Mobility   Mobility Specialist Progress Note  Received pt in bed having no complaints and agreeable to mobility. Pt was asymptomatic throughout ambulation and returned to room w/o fault. Left in bed w/ call bell in reach and all needs met.   Lucious Groves Mobility Specialist  Please contact via SecureChat or Rehab office at [phone]

## 2022-12-04 ENCOUNTER — Telehealth: Payer: Self-pay

## 2022-12-04 ENCOUNTER — Telehealth: Payer: Self-pay | Admitting: *Deleted

## 2022-12-04 NOTE — Telephone Encounter (Signed)
Transition Care Management Follow-up Telephone Call Date of discharge and from where: [facility] 12/03/2022 dx: malignant pancrease How have you been since you were released from the hospital? Nauseated, weak Any questions or concerns? No  Items Reviewed: Did the pt receive and understand the discharge instructions provided? Yes  Medications obtained and verified? Yes  Other? No  Any new allergies since your discharge? No  Dietary orders reviewed? Yes Do you have support at home? Yes   Home Care and Equipment/Supplies: Were home health services ordered? no If so, what is the name of the agency? N/a  Has the agency set up a time to come to the patient's home? no Were any new equipment or medical supplies ordered?  No What is the name of the medical supply agency? N/a Were you able to get the supplies/equipment? no Do you have any questions related to the use of the equipment or supplies? No  Functional Questionnaire: (I = Independent and D = Dependent) ADLs: I  Bathing/Dressing- D  Meal Prep- D  Eating- I  Maintaining continence- D  Transferring/Ambulation- D  Managing Meds- D  Follow up appointments reviewed:  PCP Hospital f/u appt confirmed? No   Specialist Hospital f/u appt confirmed? Yes  Scheduled to see Dr [name] on 12/21/2022 @ 10:00. Are transportation arrangements needed? No  If their condition worsens, is the pt aware to call PCP or go to the Emergency Dept.? Yes Was the patient provided with contact information for the PCP's office or ED? Yes Was to pt encouraged to call back with questions or concerns? Yes  Juanda Crumble, LPN [place] Direct Dial [phone]

## 2022-12-04 NOTE — Telephone Encounter (Signed)
Per Dr. [name]: OK to increase Duragesic to 100 mcg every 3 days and can increase hydromorphone to 12 mg every 4 hours. F/U on 2/8 as scheduled to evaluate.  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 She requests to speak w/MD privately regarding his prognosis and other questions she does not want to ask in front of patient.

## 2022-12-04 NOTE — Telephone Encounter (Signed)
Mr. Sadowsky reports poor pain control since hospital discharge. Rated 9/10 and is constant "hard ache as if he was hit by baseball bat in the abdomen".  Taking the hydromorphone 8 mg every 4 hours ATC (did miss his dose during the night). Duragesic 50 mcg patch in place. RN inquired when was last BM and he reports 12/02/22--he will increase his Colace and add MiraLax if needed to keep bowels moving, which may help his pain as well. He is asking if he could see Dr. [name] in the office this week?

## 2022-12-06 ENCOUNTER — Encounter: Payer: Self-pay | Admitting: Nurse Practitioner

## 2022-12-06 ENCOUNTER — Inpatient Hospital Stay: Payer: Medicare HMO | Attending: Oncology | Admitting: Nurse Practitioner

## 2022-12-06 ENCOUNTER — Encounter: Payer: Self-pay | Admitting: *Deleted

## 2022-12-06 VITALS — BP 122/79 | HR 69 | Temp 98.1°F | Resp 18 | Ht 72.0 in | Wt 213.0 lb

## 2022-12-06 DIAGNOSIS — C258 Malignant neoplasm of overlapping sites of pancreas: Secondary | ICD-10-CM | POA: Diagnosis present

## 2022-12-06 DIAGNOSIS — C786 Secondary malignant neoplasm of retroperitoneum and peritoneum: Secondary | ICD-10-CM | POA: Insufficient documentation

## 2022-12-06 DIAGNOSIS — C61 Malignant neoplasm of prostate: Secondary | ICD-10-CM | POA: Diagnosis not present

## 2022-12-06 DIAGNOSIS — C251 Malignant neoplasm of body of pancreas: Secondary | ICD-10-CM | POA: Diagnosis not present

## 2022-12-06 DIAGNOSIS — G893 Neoplasm related pain (acute) (chronic): Secondary | ICD-10-CM | POA: Insufficient documentation

## 2022-12-06 DIAGNOSIS — Z5111 Encounter for antineoplastic chemotherapy: Secondary | ICD-10-CM | POA: Diagnosis present

## 2022-12-06 MED ORDER — HYDROMORPHONE HCL 4 MG PO TABS: 4.0000 mg | ORAL_TABLET | ORAL | 0 refills | Status: DC | PRN

## 2022-12-06 MED ORDER — PANTOPRAZOLE SODIUM 40 MG PO TBEC: 40.0000 mg | DELAYED_RELEASE_TABLET | Freq: Two times a day (BID) | ORAL | 1 refills | Status: DC

## 2022-12-06 MED ORDER — POTASSIUM CHLORIDE CRYS ER 20 MEQ PO TBCR: 20.0000 meq | EXTENDED_RELEASE_TABLET | Freq: Every day | ORAL | 1 refills | Status: DC

## 2022-12-06 NOTE — Progress Notes (Signed)
PATIENT NAVIGATOR PROGRESS NOTE  Name: Jared [name] Date: 12/06/2022 MRN: [id number]  DOB: 1946-09-20   Reason for visit:  F/U visit  Comments:   Foundation one testing requested on accession number [id number]    Time spent counseling/coordinating care: 15-30 minutes

## 2022-12-06 NOTE — Progress Notes (Signed)
[facility] OFFICE PROGRESS NOTE   Diagnosis: Pancreas cancer  INTERVAL HISTORY:   Mr. Callanan returns for follow-up.  He underwent exploratory laparotomy 11/27/2022.  He was found to have metastatic implants to the stomach, omentum and transverse colon mesentery.  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  He underwent CT-guided nerve block of the celiac axis on 11/29/2022.  He was discharged home 11/29/2022.  He contacted the office 12/04/2022 to report poor pain control.  He was instructed to increase the Duragesic patch to 100 mcg every 3 days and increase hydromorphone to 12 mg every 4 hours as needed.  He did not increase the Duragesic patch, continues 50 mcg every 3 days.  He is taking 8 mg of hydromorphone every 4 hours around-the-clock.  He reports good pain control.  Some issues with constipation.  Objective:  Vital signs in last 24 hours:  Blood pressure 122/79, pulse 69, temperature 98.1 F (36.7 C), temperature source Oral, resp. rate 18, height 6' (1.829 m), weight 213 lb (96.6 kg), SpO2 100 %.    Resp: Lungs clear bilaterally. Cardio: Regular rate and rhythm. GI: Abdomen soft and nontender.  No hepatomegaly.  Healed midline incision.  Surrounding ecchymosis. Vascular: No significant leg edema. Neuro: Alert and oriented. Port-A-Cath without erythema.  Lab Results:  Lab Results  Component Value Date   WBC 4.9 12/01/2022   HGB 11.0 (L) 12/01/2022   HCT 32.5 (L) 12/01/2022   MCV 88.3 12/01/2022   PLT 159 12/01/2022   NEUTROABS 3.3 12/01/2022    Imaging:  No results found.  Medications: I have reviewed the patient's current medications.  Assessment/Plan: Pancreas cancer FNA biopsy of a pancreas body/tail mass 07/05/2022-adenocarcinoma CT abdomen/pelvis 06/14/2022-hypoenhancing pancreas body/tail mass with effacement of the splenic vein, no evidence of lymphadenopathy or metastatic disease CA 19-9 on 06/18/2022-767 EUS  07/05/2022-a 7 x 29 mm pancreas body/tail mass, abutment of the splenic artery, no malignant appearing lymph nodes, T2N0 by EUS CTs 07/15/2022-pancreas body/tail mass with no evidence of metastatic disease, splenic vein associated with the tumor, no arterial involvement, stable right greater than left lung nodules favored benign Cycle 1 FOLFOX 08/01/2022 Cycle 2 FOLFOX, 08/16/2022, neulasta Cycle 3 FOLFIRINOX 08/29/2022, Neulasta Cycle 4 FOLFIRINOX 09/12/2022, Neulasta, irinotecan and 5-fluorouracil dose reduced due to diarrhea/weight loss Cycle 5 FOLFIRINOX 09/26/2022 CT abdomen/pelvis 10/09/2022-no significant change in the appearance of the ill-defined pancreatic tail mass.  Persistent extrinsic mass effect on the splenic vein which remains patent.  Indeterminate left adrenal nodule. 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 Prostate cancer, status post prostatectomy 1990 Chronic elevation of the PSA-maintained on Trelstar, followed by Dr. Jeffie Pollock   3.  Kidney stones 4.   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  Abdominal pain, secondary to #1 Celiac plexus block 11/29/2022 6.  Family history of prostate and pancreas cancer 7.  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  Typhlitis in 2014 9.  Colon polyps-tubular adenomas on colonoscopy 03/27/2022 10.  Presentation emergency room 10/01/2022 and 10/02/2022 with nausea/vomiting, diarrhea, and chest pain-esophagitis      Disposition: Mr. Besch appears unchanged.  He was found to have evidence of metastatic disease at time of recent exploratory laparotomy.  Planned surgery was aborted.  These findings were again reviewed with him and his wife at today's visit.  They understand no  therapy will be curative.  Dr. [name]  recommends gemcitabine/Abraxane on a 2-week schedule.  We reviewed potential side effects including bone marrow toxicity, nausea, hair loss, allergic reaction.  We discussed the possibility of fever, rash, pneumonitis with Gemcitabine.  We discussed the neuropathy associated with Abraxane.  He was provided with printed information as well.  He agrees to proceed.  We are referring him for baseline CT scans.  For pain he will continue the current regimen of the Duragesic patch 50 mcg every 3 days and hydromorphone 8 mg every 4 hours.  He will contact the office if this is not effective.  He will return for follow-up in Gemcitabine/Abraxane in 1 week.  We are available to see him sooner if needed.  Patient seen with Dr. [name].    [name] ANP/GNP-BC   12/06/2022  8:32 AM  This was a shared visit with [name].  Mr. Biscoe was interviewed and examined.  He underwent an exploratory laparotomy and biopsy of an omental implant on 11/27/2022.  The pathology from an omental nodule excision revealed metastatic adenocarcinoma.  I saw Mr. Mcpheters in the hospital last week.  He was discharged on 11/29/2022.  He underwent a celiac plexus block on 11/29/2022.  He continues to have abdominal pain.  He will continue the Duragesic patch and as needed Dilaudid.  We will increase the Duragesic patch if his pain does not improve over the next week.  We discussed treatment options with Mr. Klopfenstein.  I recommend systemic therapy with gemcitabine/Abraxane.  We reviewed potential toxicities associated with this regimen.  He agrees to proceed.  A chemotherapy plan was entered today. The plan is to begin gemcitabine/Abraxane 15 2024.  We will submit the omental biopsy tissue for molecular testing.  I was present for greater than 50% of today's visit.  I performed medical decision making.  Julieanne Manson, MD

## 2022-12-07 ENCOUNTER — Other Ambulatory Visit (HOSPITAL_BASED_OUTPATIENT_CLINIC_OR_DEPARTMENT_OTHER): Payer: Self-pay

## 2022-12-07 ENCOUNTER — Encounter: Payer: Self-pay | Admitting: Oncology

## 2022-12-07 ENCOUNTER — Other Ambulatory Visit: Payer: Self-pay | Admitting: Nurse Practitioner

## 2022-12-07 DIAGNOSIS — C251 Malignant neoplasm of body of pancreas: Secondary | ICD-10-CM

## 2022-12-07 MED ORDER — HYDROMORPHONE HCL 4 MG PO TABS
4.0000 mg | ORAL_TABLET | ORAL | 0 refills | Status: DC | PRN
  Filled 2022-12-07: qty 75, 7d supply, fill #0

## 2022-12-07 NOTE — Progress Notes (Signed)
DISCONTINUE ON PATHWAY REGIMEN - Pancreatic Adenocarcinoma     A cycle is every 14 days:     Irinotecan      Oxaliplatin      Leucovorin      Fluorouracil   **Always confirm dose/schedule in your pharmacy ordering system**  REASON: Disease Progression PRIOR TREATMENT: PANOS95: mFOLFIRINOX q14 Days x 4 Cycles TREATMENT RESPONSE: Progressive Disease (PD)  START ON PATHWAY REGIMEN - Pancreatic Adenocarcinoma     A cycle is every 28 days:     Nab-paclitaxel (protein bound)      Gemcitabine   **Always confirm dose/schedule in your pharmacy ordering system**  Patient Characteristics: Metastatic Disease, Second Line, MSS/pMMR or MSI Unknown, Fluoropyrimidine-Based Therapy First Line Therapeutic Status: Metastatic Disease Line of Therapy: Second Line Microsatellite/Mismatch Repair Status: Unknown Intent of Therapy: Non-Curative / Palliative Intent, Discussed with Patient

## 2022-12-08 ENCOUNTER — Other Ambulatory Visit: Payer: Self-pay

## 2022-12-10 NOTE — Progress Notes (Signed)
Pharmacist Chemotherapy Monitoring - Initial Assessment    Anticipated start date: 12/13/22  The following has been reviewed per standard work regarding the patient's treatment regimen: The patient's diagnosis, treatment plan and drug doses, and organ/hematologic function Lab orders and baseline tests specific to treatment regimen  The treatment plan start date, drug sequencing, and pre-medications Prior authorization status  Patient's documented medication list, including drug-drug interaction screen and prescriptions for anti-emetics and supportive care specific to the treatment regimen The drug concentrations, fluid compatibility, administration routes, and timing of the medications to be used The patient's access for treatment and lifetime cumulative dose history, if applicable  The patient's medication allergies and previous infusion related reactions, if applicable   Changes made to treatment plan:  N/A  Follow up needed:  Pending authorization for treatment    Kennith Center, Pharm.D., CPP 12/10/2022@8$ :24 AM

## 2022-12-11 ENCOUNTER — Other Ambulatory Visit: Payer: Self-pay | Admitting: Oncology

## 2022-12-11 ENCOUNTER — Inpatient Hospital Stay: Payer: Medicare HMO

## 2022-12-11 ENCOUNTER — Ambulatory Visit (HOSPITAL_BASED_OUTPATIENT_CLINIC_OR_DEPARTMENT_OTHER): Payer: Medicare HMO

## 2022-12-11 MED ORDER — LIDOCAINE-PRILOCAINE 2.5-2.5 % EX CREA: 1.0000 | TOPICAL_CREAM | CUTANEOUS | 2 refills | Status: DC | PRN

## 2022-12-11 NOTE — Telephone Encounter (Signed)
duplicate

## 2022-12-12 ENCOUNTER — Other Ambulatory Visit (HOSPITAL_COMMUNITY): Payer: Self-pay

## 2022-12-12 ENCOUNTER — Inpatient Hospital Stay: Payer: Medicare HMO

## 2022-12-12 ENCOUNTER — Other Ambulatory Visit: Payer: Self-pay | Admitting: Nurse Practitioner

## 2022-12-12 ENCOUNTER — Ambulatory Visit (HOSPITAL_BASED_OUTPATIENT_CLINIC_OR_DEPARTMENT_OTHER)
Admission: RE | Admit: 2022-12-12 | Discharge: 2022-12-12 | Disposition: A | Discharge status: Home / Self Care | Payer: Medicare HMO | Source: Ambulatory Visit | Attending: Nurse Practitioner | Admitting: Nurse Practitioner

## 2022-12-12 DIAGNOSIS — C251 Malignant neoplasm of body of pancreas: Secondary | ICD-10-CM

## 2022-12-12 LAB — CBC WITH DIFFERENTIAL (CANCER CENTER ONLY)
Abs Immature Granulocytes: 0.01 10*3/uL (ref 0.00–0.07)
Basophils Absolute: 0.1 10*3/uL (ref 0.0–0.1)
Basophils Relative: 1 %
Eosinophils Absolute: 0.2 10*3/uL (ref 0.0–0.5)
Eosinophils Relative: 3 %
HCT: 37.4 % — ABNORMAL LOW (ref 39.0–52.0)
Hemoglobin: 12.2 g/dL — ABNORMAL LOW (ref 13.0–17.0)
Immature Granulocytes: 0 %
Lymphocytes Relative: 16 %
Lymphs Abs: 0.8 10*3/uL (ref 0.7–4.0)
MCH: 28.9 pg (ref 26.0–34.0)
MCHC: 32.6 g/dL (ref 30.0–36.0)
MCV: 88.6 fL (ref 80.0–100.0)
Monocytes Absolute: 0.6 10*3/uL (ref 0.1–1.0)
Monocytes Relative: 12 %
Neutro Abs: 3.5 10*3/uL (ref 1.7–7.7)
Neutrophils Relative %: 68 %
Platelet Count: 246 10*3/uL (ref 150–400)
RBC: 4.22 MIL/uL (ref 4.22–5.81)
RDW: 13.6 % (ref 11.5–15.5)
WBC Count: 5.2 10*3/uL (ref 4.0–10.5)
nRBC: 0 % (ref 0.0–0.2)

## 2022-12-12 LAB — CMP (CANCER CENTER ONLY)
ALT: 29 U/L (ref 0–44)
AST: 18 U/L (ref 15–41)
Albumin: 4 g/dL (ref 3.5–5.0)
Alkaline Phosphatase: 66 U/L (ref 38–126)
Anion gap: 8 (ref 5–15)
BUN: 8 mg/dL (ref 8–23)
CO2: 28 mmol/L (ref 22–32)
Calcium: 9.7 mg/dL (ref 8.9–10.3)
Chloride: 102 mmol/L (ref 98–111)
Creatinine: 0.68 mg/dL (ref 0.61–1.24)
GFR, Estimated: >60 mL/min (ref >60)
Glucose, Bld: 131 mg/dL — ABNORMAL HIGH (ref 70–99)
Potassium: 4.1 mmol/L (ref 3.5–5.1)
Sodium: 138 mmol/L (ref 135–145)
Total Bilirubin: 0.5 mg/dL (ref 0.3–1.2)
Total Protein: 6.3 g/dL — ABNORMAL LOW (ref 6.5–8.1)

## 2022-12-12 MED ORDER — IOHEXOL 300 MG/ML  SOLN
100.0000 mL | Freq: Once | INTRAMUSCULAR | Status: AC | PRN
  Administered 2022-12-12: 85 mL via INTRAVENOUS

## 2022-12-12 MED ORDER — HYDROMORPHONE HCL 4 MG PO TABS
4.0000 mg | ORAL_TABLET | ORAL | 0 refills | Status: DC | PRN
  Filled 2022-12-12: qty 75, 7d supply, fill #0

## 2022-12-12 MED ORDER — HEPARIN SOD (PORK) LOCK FLUSH 100 UNIT/ML IV SOLN
500.0000 [IU] | Freq: Once | INTRAVENOUS | Status: AC
  Administered 2022-12-12: 500 [IU] via INTRAVENOUS

## 2022-12-13 ENCOUNTER — Inpatient Hospital Stay: Payer: Medicare HMO

## 2022-12-13 ENCOUNTER — Other Ambulatory Visit (HOSPITAL_COMMUNITY): Payer: Self-pay

## 2022-12-13 ENCOUNTER — Inpatient Hospital Stay: Payer: Medicare HMO | Admitting: Nurse Practitioner

## 2022-12-13 ENCOUNTER — Encounter: Payer: Self-pay | Admitting: Nurse Practitioner

## 2022-12-13 ENCOUNTER — Other Ambulatory Visit (HOSPITAL_BASED_OUTPATIENT_CLINIC_OR_DEPARTMENT_OTHER): Payer: Self-pay

## 2022-12-13 VITALS — BP 124/85 | HR 72 | Temp 98.2°F | Resp 18 | Ht 72.0 in | Wt 214.0 lb

## 2022-12-13 VITALS — BP 124/79 | HR 65 | Temp 98.2°F | Resp 18

## 2022-12-13 DIAGNOSIS — Z5111 Encounter for antineoplastic chemotherapy: Secondary | ICD-10-CM | POA: Diagnosis not present

## 2022-12-13 DIAGNOSIS — C251 Malignant neoplasm of body of pancreas: Secondary | ICD-10-CM

## 2022-12-13 MED ORDER — SODIUM CHLORIDE 0.9% FLUSH
10.0000 mL | INTRAVENOUS | Status: DC | PRN
  Administered 2022-12-13: 10 mL

## 2022-12-13 MED ORDER — HEPARIN SOD (PORK) LOCK FLUSH 100 UNIT/ML IV SOLN
500.0000 [IU] | Freq: Once | INTRAVENOUS | Status: AC | PRN
  Administered 2022-12-13: 500 [IU]

## 2022-12-13 MED ORDER — PROCHLORPERAZINE MALEATE 10 MG PO TABS
10.0000 mg | ORAL_TABLET | Freq: Once | ORAL | Status: AC
  Administered 2022-12-13: 10 mg via ORAL
  Filled 2022-12-13: qty 1

## 2022-12-13 MED ORDER — HYDROMORPHONE HCL 4 MG PO TABS
4.0000 mg | ORAL_TABLET | ORAL | 0 refills | Status: DC | PRN
  Filled 2022-12-13: qty 75, 7d supply, fill #0

## 2022-12-13 MED ORDER — PACLITAXEL PROTEIN-BOUND CHEMO INJECTION 100 MG
100.0000 mg/m2 | Freq: Once | INTRAVENOUS | Status: AC
  Administered 2022-12-13: 225 mg via INTRAVENOUS
  Filled 2022-12-13: qty 45

## 2022-12-13 MED ORDER — SODIUM CHLORIDE 0.9 % IV SOLN
1000.0000 mg/m2 | Freq: Once | INTRAVENOUS | Status: AC
  Administered 2022-12-13: 2205 mg via INTRAVENOUS
  Filled 2022-12-13: qty 52.57

## 2022-12-13 MED ORDER — SODIUM CHLORIDE 0.9 % IV SOLN: Freq: Once | INTRAVENOUS | Status: AC

## 2022-12-13 NOTE — Progress Notes (Signed)
Patient seen by Lisa Thomas NP today  Vitals are within treatment parameters.  Labs reviewed by Lisa Thomas NP and are within treatment parameters.  Per physician team, patient is ready for treatment and there are NO modifications to the treatment plan.     

## 2022-12-13 NOTE — Patient Instructions (Signed)
[facility]  Discharge Instructions: Thank you for choosing [facility] to provide your oncology and hematology care.   If you have a lab appointment with the [facility], please go directly to the [facility] and check in at the registration area.   Wear comfortable clothing and clothing appropriate for easy access to any Portacath or PICC line.   We strive to give you quality time with your provider. You may need to reschedule your appointment if you arrive late (15 or more minutes).  Arriving late affects you and other patients whose appointments are after yours.  Also, if you miss three or more appointments without notifying the office, you may be dismissed from the clinic at the provider's discretion.      For prescription refill requests, have your pharmacy contact our office and allow 72 hours for refills to be completed.    Today you received the following chemotherapy and/or immunotherapy agents: abraxane, gemcitabine      To help prevent nausea and vomiting after your treatment, we encourage you to take your nausea medication as directed.  BELOW ARE SYMPTOMS THAT SHOULD BE REPORTED IMMEDIATELY: *FEVER GREATER THAN 100.4 F (38 C) OR HIGHER *CHILLS OR SWEATING *NAUSEA AND VOMITING THAT IS NOT CONTROLLED WITH YOUR NAUSEA MEDICATION *UNUSUAL SHORTNESS OF BREATH *UNUSUAL BRUISING OR BLEEDING *URINARY PROBLEMS (pain or burning when urinating, or frequent urination) *BOWEL PROBLEMS (unusual diarrhea, constipation, pain near the anus) TENDERNESS IN MOUTH AND THROAT WITH OR WITHOUT PRESENCE OF ULCERS (sore throat, sores in mouth, or a toothache) UNUSUAL RASH, SWELLING OR PAIN  UNUSUAL VAGINAL DISCHARGE OR ITCHING   Items with * indicate a potential emergency and should be followed up as soon as possible or go to the Emergency Department if any problems should occur.  Please show the CHEMOTHERAPY ALERT CARD or IMMUNOTHERAPY ALERT  CARD at check-in to the Emergency Department and triage nurse.  Should you have questions after your visit or need to cancel or reschedule your appointment, please contact [facility]  Dept: [phone]  and follow the prompts.  Office hours are 8:00 a.m. to 4:30 p.m. Monday - Friday. Please note that voicemails left after 4:00 p.m. may not be returned until the following business day.  We are closed weekends and major holidays. You have access to a nurse at all times for urgent questions. Please call the main number to the clinic Dept: [phone] and follow the prompts.   For any non-urgent questions, you may also contact your provider using MyChart. We now offer e-Visits for anyone 67 and older to request care online for non-urgent symptoms. For details visit [email or web address].   Also download the MyChart app! Go to the app store, search "MyChart", open the app, select [facility], and log in with your MyChart username and password.  Paclitaxel Nanoparticle Albumin-Bound Injection What is this medication? NANOPARTICLE ALBUMIN-BOUND PACLITAXEL (Na no PAHR ti kuhl al BYOO muhn-bound PAK li TAX el) treats some types of cancer. It works by slowing down the growth of cancer cells. This medicine may be used for other purposes; ask your health care provider or pharmacist if you have questions. COMMON BRAND NAME(S): Abraxane What should I tell my care team before I take this medication? They need to know if you have any of these conditions: Liver disease Low white blood cell levels An unusual or allergic reaction to paclitaxel, albumin, other medications, foods, dyes, or preservatives If you  or your partner are pregnant or trying to get pregnant Breast-feeding How should I use this medication? This medication is injected into a vein. It is given by your care team in a hospital or clinic setting. Talk to your care team about the use of this medication in  children. Special care may be needed. Overdosage: If you think you have taken too much of this medicine contact a poison control center or emergency room at once. NOTE: This medicine is only for you. Do not share this medicine with others. What if I miss a dose? Keep appointments for follow-up doses. It is important not to miss your dose. Call your care team if you are unable to keep an appointment. What may interact with this medication? Other medications may affect the way this medication works. Talk with your care team about all of the medications you take. They may suggest changes to your treatment plan to lower the risk of side effects and to make sure your medications work as intended. This list may not describe all possible interactions. Give your health care provider a list of all the medicines, herbs, non-prescription drugs, or dietary supplements you use. Also tell them if you smoke, drink alcohol, or use illegal drugs. Some items may interact with your medicine. What should I watch for while using this medication? Your condition will be monitored carefully while you are receiving this medication. You may need blood work while taking this medication. This medication may make you feel generally unwell. This is not uncommon as chemotherapy can affect healthy cells as well as cancer cells. Report any side effects. Continue your course of treatment even though you feel ill unless your care team tells you to stop. This medication can cause serious allergic reactions. To reduce the risk, your care team may give you other medications to take before receiving this one. Be sure to follow the directions from your care team. This medication may increase your risk of getting an infection. Call your care team for advice if you get a fever, chills, sore throat, or other symptoms of a cold or flu. Do not treat yourself. Try to avoid being around people who are sick. This medication may increase your risk to  bruise or bleed. Call your care team if you notice any unusual bleeding. Be careful brushing or flossing your teeth or using a toothpick because you may get an infection or bleed more easily. If you have any dental work done, tell your dentist you are receiving this medication. Talk to your care team if you or your partner may be pregnant. Serious birth defects can occur if you take this medication during pregnancy and for 6 months after the last dose. You will need a negative pregnancy test before starting this medication. Contraception is recommended while taking this medication and for 6 months after the last dose. Your care team can help you find the option that works for you. If your partner can get pregnant, use a condom during sex while taking this medication and for 3 months after the last dose. Do not breastfeed while taking this medication and for 2 weeks after the last dose. This medication may cause infertility. Talk to your care team if you are concerned about your fertility. What side effects may I notice from receiving this medication? Side effects that you should report to your care team as soon as possible: Allergic reactions--skin rash, itching, hives, swelling of the face, lips, tongue, or throat Dry cough, shortness of  breath or trouble breathing Infection--fever, chills, cough, sore throat, wounds that don't heal, pain or trouble when passing urine, general feeling of discomfort or being unwell Low red blood cell level--unusual weakness or fatigue, dizziness, headache, trouble breathing Pain, tingling, or numbness in the hands or feet Stomach pain, unusual weakness or fatigue, nausea, vomiting, diarrhea, or fever that lasts longer than expected Unusual bruising or bleeding Side effects that usually do not require medical attention (report to your care team if they continue or are bothersome): Diarrhea Fatigue Hair loss Loss of appetite Nausea Vomiting This list may not  describe all possible side effects. Call your doctor for medical advice about side effects. You may report side effects to FDA at 1-800-FDA-1088. Where should I keep my medication? This medication is given in a hospital or clinic. It will not be stored at home. NOTE: This sheet is a summary. It may not cover all possible information. If you have questions about this medicine, talk to your doctor, pharmacist, or health care provider.  2023 Elsevier/Gold Standard (2007-12-06 00:00:00)  Gemcitabine Injection What is this medication? GEMCITABINE (jem SYE ta been) treats some types of cancer. It works by slowing down the growth of cancer cells. This medicine may be used for other purposes; ask your health care provider or pharmacist if you have questions. COMMON BRAND NAME(S): Gemzar, Infugem What should I tell my care team before I take this medication? They need to know if you have any of these conditions: Blood disorders Infection Kidney disease Liver disease Lung or breathing disease, such as asthma or COPD Recent or ongoing radiation therapy An unusual or allergic reaction to gemcitabine, other medications, foods, dyes, or preservatives If you or your partner are pregnant or trying to get pregnant Breast-feeding How should I use this medication? This medication is injected into a vein. It is given by your care team in a hospital or clinic setting. Talk to your care team about the use of this medication in children. Special care may be needed. Overdosage: If you think you have taken too much of this medicine contact a poison control center or emergency room at once. NOTE: This medicine is only for you. Do not share this medicine with others. What if I miss a dose? Keep appointments for follow-up doses. It is important not to miss your dose. Call your care team if you are unable to keep an appointment. What may interact with this medication? Interactions have not been studied. This list  may not describe all possible interactions. Give your health care provider a list of all the medicines, herbs, non-prescription drugs, or dietary supplements you use. Also tell them if you smoke, drink alcohol, or use illegal drugs. Some items may interact with your medicine. What should I watch for while using this medication? Your condition will be monitored carefully while you are receiving this medication. This medication may make you feel generally unwell. This is not uncommon, as chemotherapy can affect healthy cells as well as cancer cells. Report any side effects. Continue your course of treatment even though you feel ill unless your care team tells you to stop. In some cases, you may be given additional medications to help with side effects. Follow all directions for their use. This medication may increase your risk of getting an infection. Call your care team for advice if you get a fever, chills, sore throat, or other symptoms of a cold or flu. Do not treat yourself. Try to avoid being around people who  are sick. This medication may increase your risk to bruise or bleed. Call your care team if you notice any unusual bleeding. Be careful brushing or flossing your teeth or using a toothpick because you may get an infection or bleed more easily. If you have any dental work done, tell your dentist you are receiving this medication. Avoid taking medications that contain aspirin, acetaminophen, ibuprofen, naproxen, or ketoprofen unless instructed by your care team. These medications may hide a fever. Talk to your care team if you or your partner wish to become pregnant or think you might be pregnant. This medication can cause serious birth defects if taken during pregnancy and for 6 months after the last dose. A negative pregnancy test is required before starting this medication. A reliable form of contraception is recommended while taking this medication and for 6 months after the last dose. Talk to  your care team about effective forms of contraception. Do not father a child while taking this medication and for 3 months after the last dose. Use a condom while having sex during this time period. Do not breastfeed while taking this medication and for at least 1 week after the last dose. This medication may cause infertility. Talk to your care team if you are concerned about your fertility. What side effects may I notice from receiving this medication? Side effects that you should report to your care team as soon as possible: Allergic reactions--skin rash, itching, hives, swelling of the face, lips, tongue, or throat Capillary leak syndrome--stomach or muscle pain, unusual weakness or fatigue, feeling faint or lightheaded, decrease in the amount of urine, swelling of the ankles, hands, or feet, trouble breathing Infection--fever, chills, cough, sore throat, wounds that don't heal, pain or trouble when passing urine, general feeling of discomfort or being unwell Liver injury--right upper belly pain, loss of appetite, nausea, light-colored stool, dark yellow or brown urine, yellowing skin or eyes, unusual weakness or fatigue Low red blood cell level--unusual weakness or fatigue, dizziness, headache, trouble breathing Lung injury--shortness of breath or trouble breathing, cough, spitting up blood, chest pain, fever Stomach pain, bloody diarrhea, pale skin, unusual weakness or fatigue, decrease in the amount of urine, which may be signs of hemolytic uremic syndrome Sudden and severe headache, confusion, change in vision, seizures, which may be signs of posterior reversible encephalopathy syndrome (PRES) Unusual bruising or bleeding Side effects that usually do not require medical attention (report to your care team if they continue or are bothersome): Diarrhea Drowsiness Hair loss Nausea Pain, redness, or swelling with sores inside the mouth or throat Vomiting This list may not describe all  possible side effects. Call your doctor for medical advice about side effects. You may report side effects to FDA at 1-800-FDA-1088. Where should I keep my medication? This medication is given in a hospital or clinic. It will not be stored at home. NOTE: This sheet is a summary. It may not cover all possible information. If you have questions about this medicine, talk to your doctor, pharmacist, or health care provider.  2023 Elsevier/Gold Standard (2007-12-06 00:00:00)

## 2022-12-13 NOTE — Progress Notes (Signed)
[facility] OFFICE PROGRESS NOTE   Diagnosis: Pancreas cancer  INTERVAL HISTORY:   Mr. Vongphakdy returns as scheduled.  Pain is "not bad" at present.  Pain is mainly located at the right mid to low abdomen.  He continues a Duragesic patch and Dilaudid 1 to 2 tablets every 4 hours.  Bowels moving.  No nausea.  He denies neuropathy symptoms.  Objective:  Vital signs in last 24 hours:  Blood pressure 124/85, pulse 72, temperature 98.2 F (36.8 C), temperature source Oral, resp. rate 18, height 6' (1.829 m), weight 214 lb (97.1 kg), SpO2 99 %.    HEENT: Mild white coating over tongue.  No buccal thrush.  No ulcers. Resp: Lungs clear bilaterally. Cardio: Regular rate and rhythm. GI: Abdomen soft, mild generalized tenderness.  No hepatomegaly.  Healed midline surgical incision. Vascular: Trace edema lower leg bilaterally. Neuro: Alert and oriented. Port-A-Cath without erythema.  Lab Results:  Lab Results  Component Value Date   WBC 5.2 12/12/2022   HGB 12.2 (L) 12/12/2022   HCT 37.4 (L) 12/12/2022   MCV 88.6 12/12/2022   PLT 246 12/12/2022   NEUTROABS 3.5 12/12/2022    Imaging:  CT CHEST ABDOMEN PELVIS W CONTRAST  Result Date: 12/12/2022 CLINICAL DATA:  Assess pancreatic cancer for staging purposes. * Tracking Code: BO * EXAM: CT CHEST, ABDOMEN, AND PELVIS WITH CONTRAST TECHNIQUE: Multidetector CT imaging of the chest, abdomen and pelvis was performed following the standard protocol during bolus administration of intravenous contrast. RADIATION DOSE REDUCTION: This exam was performed according to the departmental dose-optimization program which includes automated exposure control, adjustment of the mA and/or kV according to patient size and/or use of iterative reconstruction technique. CONTRAST:  34m OMNIPAQUE IOHEXOL 300 MG/ML  SOLN COMPARISON:  Multiple prior studies most recent comparison from December of 2023 FINDINGS: CT CHEST FINDINGS Cardiovascular: Normal  caliber of the thoracic aorta. Normal caliber of central pulmonary vessels. LEFT-sided Port-A-Cath terminates at the lower portion of the superior vena cava. Mediastinum/Nodes: No retrocrural adenopathy, mediastinal or hilar lymphadenopathy. No thoracic inlet or axillary lymphadenopathy. Lungs/Pleura: No sign of consolidation or evidence of pleural effusion. Small RIGHT middle lobe nodule present since 2016 and only minimally enlarged now measuring 4 mm previously 3 mm in 2016. Indistinct RIGHT lower lobe nodule (image 78/4) 5 mm. Airways are patent. Musculoskeletal: See below for full musculoskeletal details. CT ABDOMEN PELVIS FINDINGS Hepatobiliary: No focal, suspicious hepatic lesion. Portal vein is patent. Post cholecystectomy without signs of biliary duct distension. Pancreas: Pancreas mass measuring 4.5 x 3.2 cm previously approximately 4.0 x 2.5 cm. Increasing ductal dilation of the peripheral pancreatic duct. LEFT adrenal gland and proximity to the pancreatic lesion, see below. Ill-defined soft tissue density in the lesser sac adjacent to the stomach anterior to the pancreatic neck (image 62/2) 3.7 x 2.6 cm. Study not obtained as a "pancreatic protocol" CT but the splenic portal confluence involvement seen on previous imaging appears more apparent on the current study. Splenic artery also likely with involvement and stranding extending to the celiac and SMA. Soft tissue extends to the ligament of Treitz. Spleen: Normal. Adrenals/Urinary Tract: LEFT adrenal lesion enlarging 2.6 cm greatest axial dimension previously 2.1 cm with lower density centrally than on previous imaging from December. RIGHT adrenal gland is normal. Stomach/Bowel: No signs of bowel obstruction or acute bowel process. Appendix not visualized but no secondary signs to suggest acute appendicitis. No signs of colonic obstruction or definitive signs of colonic inflammation. Area of low attenuation along  the anti mesenteric margin of the  sigmoid (image 105/2) 2.3 x 1.7 cm. 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 No substantial surrounding stranding is noted however. Vascular/Lymphatic: No adenopathy in the retroperitoneum. No mesenteric adenopathy or pelvic lymphadenopathy. Reproductive: Post prostatectomy. Other: Small areas of nodularity in the pelvis adjacent to the lesion about the sigmoid discussed above (image 107/2) 7 mm nodule for reference. Nodule along the under surface of the RIGHT hemiliver (image 56/2) 11 mm. Another nodular area just superior to the hepatic flexure of the colon best seen on coronal image 43/5. This is present in more since pouch also measuring approximately 11 mm. Scattered areas of nodularity in the omentum, for instance on image 81 of series 2, 6 mm nodule in the omentum in the anterior abdomen. No ascites. No pneumoperitoneum. Musculoskeletal: Spinal degenerative changes. No destructive bone finding or acute bone process. IMPRESSION: 1. Increasing size of the pancreatic mass with suspected metastatic disease involving the lesser sac and adjacent stomach. New area of masslike nodularity anterior to the pancreatic neck appears to tether the gastric antrum. 2. Worsening of peritoneal disease along the inferior surface of the RIGHT hepatic margin. 3. Lesion along the margin of the sigmoid colon is favored to represent a tumor implant in this patient with known peritoneal disease. Phlegmon in the setting of diverticular changes is another differential consideration. Correlate with any current signs of abdominal pain. Would favor the possibility peritoneal spread of tumor over acute process at this time. 4. LEFT adrenal gland in close proximity to the pancreatic lesion, enlarging with lower density centrally than on previous imaging from December. Findings suggest the possibility of collision tumor with necrosis and enlargement involving a pre-existing LEFT adrenal adenoma. 5. Scattered  more conspicuous areas of peritoneal and omental involvement. 6. Vascular involvement seen on previous imaging most notably of the splenic vein and splenic artery. These results will be called to the ordering clinician or representative by the Radiologist Assistant, and communication documented in the PACS or [employer]. Electronically Signed   By: Zetta Bills M.D.   On: 12/12/2022 16:08    Medications: I have reviewed the patient's current medications.  Assessment/Plan: Pancreas cancer FNA biopsy of a pancreas body/tail mass 07/05/2022-adenocarcinoma CT abdomen/pelvis 06/14/2022-hypoenhancing pancreas body/tail mass with effacement of the splenic vein, no evidence of lymphadenopathy or metastatic disease CA 19-9 on 06/18/2022-767 EUS 07/05/2022-a 7 x 29 mm pancreas body/tail mass, abutment of the splenic artery, no malignant appearing lymph nodes, T2N0 by EUS CTs 07/15/2022-pancreas body/tail mass with no evidence of metastatic disease, splenic vein associated with the tumor, no arterial involvement, stable right greater than left lung nodules favored benign Cycle 1 FOLFOX 08/01/2022 Cycle 2 FOLFOX, 08/16/2022, neulasta Cycle 3 FOLFIRINOX 08/29/2022, Neulasta Cycle 4 FOLFIRINOX 09/12/2022, Neulasta, irinotecan and 5-fluorouracil dose reduced due to diarrhea/weight loss Cycle 5 FOLFIRINOX 09/26/2022 CT abdomen/pelvis 10/09/2022-no significant change in the appearance of the ill-defined pancreatic tail mass.  Persistent extrinsic mass effect on the splenic vein which remains patent.  Indeterminate left adrenal nodule. 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 CTs 12/12/2022-increasing size of pancreatic mass with suspected metastatic disease involving the lesser sac and adjacent stomach.  New area of masslike nodularity anterior to the pancreatic neck appears to  tether the gastric antrum.  Worsening of peritoneal disease along the inferior surface of the right hepatic margin.  Lesion along the margin of the sigmoid  colon is favored to represent a tumor implant.  Enlarging left adrenal gland in close proximity to the pancreatic lesion.  Scattered more conspicuous areas of peritoneal and omental involvement.  Vascular involvement seen on previous imaging most notably of the splenic vein and splenic artery. Cycle 1 gemcitabine/Abraxane 12/13/2022 Prostate cancer, status post prostatectomy 1990 Chronic elevation of the PSA-maintained on Trelstar, followed by Dr. Jeffie Pollock   3.  Kidney stones 4.   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  Abdominal pain, secondary to #1 Celiac plexus block 11/29/2022 6.  Family history of prostate and pancreas cancer 7.  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  Typhlitis in 2014 9.  Colon polyps-tubular adenomas on colonoscopy 03/27/2022 10.  Presentation emergency room 10/01/2022 and 10/02/2022 with nausea/vomiting, diarrhea, and chest pain-esophagitis    Disposition: Jared [name] appears stable.  He is scheduled to begin treatment today with gemcitabine/Abraxane on a 2-week schedule.  We again reviewed potential toxicities.  He agrees to proceed.  CBC and chemistry panel from yesterday reviewed.  Counts adequate to proceed as above.  We reviewed the recent CT results.  He understands there is evidence of disease progression on the scan.  He will return for lab, follow-up, cycle 2 gemcitabine/Abraxane in 2 weeks.  We are available to see him sooner if needed.    [name] ANP/GNP-BC   12/13/2022  8:41 AM

## 2022-12-14 ENCOUNTER — Telehealth: Payer: Self-pay

## 2022-12-14 ENCOUNTER — Other Ambulatory Visit: Payer: Self-pay

## 2022-12-14 LAB — CANCER ANTIGEN 19-9: CA 19-9: 2762 U/mL — ABNORMAL HIGH (ref 0–35)

## 2022-12-14 NOTE — Telephone Encounter (Signed)
Called and spoke to patient for first time chemo follow-up.  Patient stated he has been feeling ok, noted that he has been tired and taking naps.  Patient stated he took home nausea meds and has been able to eat and drink without any issues. Informed patient that the tiredness was normal and encouraged patient to continue to eat and push fluids. Patient verbalized understanding and agreed to call office if he had any questions or concerns.  All questions were answered during phone call.

## 2022-12-19 ENCOUNTER — Other Ambulatory Visit: Payer: Self-pay | Admitting: Nurse Practitioner

## 2022-12-19 ENCOUNTER — Other Ambulatory Visit (HOSPITAL_COMMUNITY): Payer: Self-pay

## 2022-12-19 ENCOUNTER — Inpatient Hospital Stay: Payer: Medicare HMO | Admitting: Nutrition

## 2022-12-19 DIAGNOSIS — C251 Malignant neoplasm of body of pancreas: Secondary | ICD-10-CM

## 2022-12-19 NOTE — Progress Notes (Signed)
Nutrition follow up completed with patient and wife on telephone. Patient is being treated for pancreas cancer with gemcitabine/Abraxane on a 2-week schedule.   Weight documented as 214 pounds Feb 15 decreased from 229 pounds Nov 29.  Labs include Glucose 131.  Patient reports early satiety, occasional nausea and increased pain in stomach. This limits his ability to eat. He has been eating soft meats with gravies and sauces. He reports he did well when he pureed items like beef stew and may try that again. Reports ginger ale helped ease nausea. He is concerned because he lost his desire for desserts.  Nutrition Diagnosis: Food and Nutrition related knowledge deficit, ongoing.  Intervention: Educated to increase small, frequent meals and snacks utilizing high calorie, high protein foods. Encouraged gravies and sauces. Stay on schedule with pain medication and nausea medication. Include ginger products like ginger tea, to help minimize nausea. Encouraged Boost Plus instead of Regular Boost or Boost Max. Mail fact sheets and contact information.  Monitoring, evaluation, Goals: Increase oral intake to minimize wt loss and tolerate treatment.  Next Visit: Will call in approximately 2 weeks.

## 2022-12-20 ENCOUNTER — Encounter (HOSPITAL_COMMUNITY): Payer: Self-pay

## 2022-12-20 ENCOUNTER — Other Ambulatory Visit (HOSPITAL_COMMUNITY): Payer: Self-pay

## 2022-12-20 ENCOUNTER — Other Ambulatory Visit: Payer: Self-pay | Admitting: Oncology

## 2022-12-20 ENCOUNTER — Other Ambulatory Visit (HOSPITAL_BASED_OUTPATIENT_CLINIC_OR_DEPARTMENT_OTHER): Payer: Self-pay

## 2022-12-20 DIAGNOSIS — C251 Malignant neoplasm of body of pancreas: Secondary | ICD-10-CM

## 2022-12-20 MED ORDER — HYDROMORPHONE HCL 4 MG PO TABS
4.0000 mg | ORAL_TABLET | ORAL | 0 refills | Status: DC | PRN
  Filled 2022-12-20: qty 150, 13d supply, fill #0

## 2022-12-21 ENCOUNTER — Inpatient Hospital Stay: Payer: Medicare HMO

## 2022-12-21 ENCOUNTER — Inpatient Hospital Stay: Payer: Medicare HMO | Admitting: Oncology

## 2022-12-22 ENCOUNTER — Other Ambulatory Visit (HOSPITAL_BASED_OUTPATIENT_CLINIC_OR_DEPARTMENT_OTHER): Payer: Self-pay

## 2022-12-22 ENCOUNTER — Other Ambulatory Visit: Payer: Self-pay | Admitting: Hematology and Oncology

## 2022-12-22 ENCOUNTER — Other Ambulatory Visit: Payer: Self-pay

## 2022-12-22 MED ORDER — FLUCONAZOLE 100 MG PO TABS
100.0000 mg | ORAL_TABLET | Freq: Every day | ORAL | 0 refills | Status: AC
  Filled 2022-12-22: qty 2, 2d supply, fill #0

## 2022-12-22 NOTE — Progress Notes (Signed)
Fluconazole requested by patient. Prescription dispensed.  Brandalynn Ofallon

## 2022-12-23 ENCOUNTER — Other Ambulatory Visit: Payer: Self-pay | Admitting: Hematology and Oncology

## 2022-12-23 ENCOUNTER — Other Ambulatory Visit: Payer: Self-pay | Admitting: Oncology

## 2022-12-24 ENCOUNTER — Other Ambulatory Visit (HOSPITAL_BASED_OUTPATIENT_CLINIC_OR_DEPARTMENT_OTHER): Payer: Self-pay

## 2022-12-24 ENCOUNTER — Encounter: Payer: Self-pay | Admitting: Oncology

## 2022-12-24 ENCOUNTER — Other Ambulatory Visit (HOSPITAL_COMMUNITY): Payer: Self-pay | Admitting: General Surgery

## 2022-12-24 DIAGNOSIS — E43 Unspecified severe protein-calorie malnutrition: Secondary | ICD-10-CM | POA: Insufficient documentation

## 2022-12-24 DIAGNOSIS — R600 Localized edema: Secondary | ICD-10-CM | POA: Insufficient documentation

## 2022-12-24 DIAGNOSIS — R609 Edema, unspecified: Secondary | ICD-10-CM

## 2022-12-25 ENCOUNTER — Ambulatory Visit (HOSPITAL_COMMUNITY)
Admission: RE | Admit: 2022-12-25 | Discharge: 2022-12-25 | Disposition: A | Discharge status: Home / Self Care | Payer: Medicare HMO | Source: Ambulatory Visit | Attending: General Surgery | Admitting: General Surgery

## 2022-12-25 DIAGNOSIS — R609 Edema, unspecified: Secondary | ICD-10-CM | POA: Insufficient documentation

## 2022-12-25 NOTE — Progress Notes (Signed)
Bilateral lower extremity venous duplex has been completed. Preliminary results can be found in CV Proc through chart review.  Results were given to [facility] at Dr. Marlowe Aschoff office.  12/25/22 12:51 PM Jared [name] RVT

## 2022-12-27 ENCOUNTER — Other Ambulatory Visit: Payer: Self-pay | Admitting: General Surgery

## 2022-12-27 ENCOUNTER — Inpatient Hospital Stay: Payer: Medicare HMO

## 2022-12-27 ENCOUNTER — Ambulatory Visit: Payer: Medicare HMO

## 2022-12-27 ENCOUNTER — Inpatient Hospital Stay: Payer: Medicare HMO | Admitting: Nurse Practitioner

## 2022-12-27 ENCOUNTER — Other Ambulatory Visit (HOSPITAL_BASED_OUTPATIENT_CLINIC_OR_DEPARTMENT_OTHER): Payer: Self-pay

## 2022-12-27 ENCOUNTER — Encounter: Payer: Self-pay | Admitting: Nurse Practitioner

## 2022-12-27 VITALS — BP 108/76 | HR 66 | Temp 98.1°F | Resp 18 | Ht 72.0 in | Wt 212.0 lb

## 2022-12-27 DIAGNOSIS — C252 Malignant neoplasm of tail of pancreas: Secondary | ICD-10-CM

## 2022-12-27 DIAGNOSIS — Z5111 Encounter for antineoplastic chemotherapy: Secondary | ICD-10-CM | POA: Diagnosis not present

## 2022-12-27 DIAGNOSIS — C251 Malignant neoplasm of body of pancreas: Secondary | ICD-10-CM

## 2022-12-27 DIAGNOSIS — Z95828 Presence of other vascular implants and grafts: Secondary | ICD-10-CM | POA: Diagnosis not present

## 2022-12-27 LAB — CBC WITH DIFFERENTIAL (CANCER CENTER ONLY)
Abs Immature Granulocytes: 0.01 10*3/uL (ref 0.00–0.07)
Basophils Absolute: 0 10*3/uL (ref 0.0–0.1)
Basophils Relative: 1 %
Eosinophils Absolute: 0.1 10*3/uL (ref 0.0–0.5)
Eosinophils Relative: 4 %
HCT: 34.7 % — ABNORMAL LOW (ref 39.0–52.0)
Hemoglobin: 11.4 g/dL — ABNORMAL LOW (ref 13.0–17.0)
Immature Granulocytes: 0 %
Lymphocytes Relative: 28 %
Lymphs Abs: 0.8 10*3/uL (ref 0.7–4.0)
MCH: 28.7 pg (ref 26.0–34.0)
MCHC: 32.9 g/dL (ref 30.0–36.0)
MCV: 87.4 fL (ref 80.0–100.0)
Monocytes Absolute: 0.6 10*3/uL (ref 0.1–1.0)
Monocytes Relative: 19 %
Neutro Abs: 1.4 10*3/uL — ABNORMAL LOW (ref 1.7–7.7)
Neutrophils Relative %: 48 %
Platelet Count: 197 10*3/uL (ref 150–400)
RBC: 3.97 MIL/uL — ABNORMAL LOW (ref 4.22–5.81)
RDW: 14.3 % (ref 11.5–15.5)
WBC Count: 2.9 10*3/uL — ABNORMAL LOW (ref 4.0–10.5)
nRBC: 0 % (ref 0.0–0.2)

## 2022-12-27 LAB — CMP (CANCER CENTER ONLY)
ALT: 11 U/L (ref 0–44)
AST: 14 U/L — ABNORMAL LOW (ref 15–41)
Albumin: 3.5 g/dL (ref 3.5–5.0)
Alkaline Phosphatase: 50 U/L (ref 38–126)
Anion gap: 8 (ref 5–15)
BUN: 6 mg/dL — ABNORMAL LOW (ref 8–23)
CO2: 27 mmol/L (ref 22–32)
Calcium: 9.2 mg/dL (ref 8.9–10.3)
Chloride: 104 mmol/L (ref 98–111)
Creatinine: 0.62 mg/dL (ref 0.61–1.24)
GFR, Estimated: >60 mL/min (ref >60)
Glucose, Bld: 140 mg/dL — ABNORMAL HIGH (ref 70–99)
Potassium: 4.1 mmol/L (ref 3.5–5.1)
Sodium: 139 mmol/L (ref 135–145)
Total Bilirubin: 0.4 mg/dL (ref 0.3–1.2)
Total Protein: 5.5 g/dL — ABNORMAL LOW (ref 6.5–8.1)

## 2022-12-27 MED ORDER — SODIUM CHLORIDE 0.9% FLUSH
10.0000 mL | Freq: Once | INTRAVENOUS | Status: AC
  Administered 2022-12-27: 10 mL via INTRAVENOUS

## 2022-12-27 MED ORDER — HEPARIN SOD (PORK) LOCK FLUSH 100 UNIT/ML IV SOLN
500.0000 [IU] | Freq: Once | INTRAVENOUS | Status: AC
  Administered 2022-12-27: 500 [IU] via INTRAVENOUS

## 2022-12-27 MED ORDER — FENTANYL 50 MCG/HR TD PT72
1.0000 | MEDICATED_PATCH | TRANSDERMAL | 0 refills | Status: DC
  Filled 2022-12-27 – 2022-12-28 (×3): qty 10, 30d supply, fill #0

## 2022-12-27 MED ORDER — FLUCONAZOLE 100 MG PO TABS
100.0000 mg | ORAL_TABLET | Freq: Every day | ORAL | 0 refills | Status: AC
  Filled 2022-12-27: qty 4, 4d supply, fill #0

## 2022-12-27 NOTE — Progress Notes (Addendum)
[facility] OFFICE PROGRESS NOTE   Diagnosis: Pancreas cancer  INTERVAL HISTORY:   Mr. Jared [name] returns as scheduled.  He completed cycle 1 gemcitabine/Abraxane 12/13/2022.  He had mild nausea on day 1.  No significant diarrhea.  No mouth sores.  He denies neuropathy symptoms.  No fever or rash after treatment.  He recently began a 7-day course of Lasix for leg edema.  Abdominal pain is better.  He feels pain is well-controlled on the current regimen of a Duragesic patch and hydromorphone as needed.  Objective:  Vital signs in last 24 hours:  Blood pressure 108/76, pulse 66, temperature 98.1 F (36.7 C), temperature source Oral, resp. rate 18, height 6' (1.829 m), weight 212 lb (96.2 kg), SpO2 100 %.    HEENT: Mild white coating over tongue.  No buccal thrush. Resp: Lungs clear bilaterally. Cardio: Regular rate and rhythm. GI: Abdomen soft and nontender.  No hepatosplenomegaly. Vascular: Pitting edema lower leg bilaterally. Neuro: Alert and oriented. Skin: No rash. Port-A-Cath without erythema.   Lab Results:  Lab Results  Component Value Date   WBC 2.9 (L) 12/27/2022   HGB 11.4 (L) 12/27/2022   HCT 34.7 (L) 12/27/2022   MCV 87.4 12/27/2022   PLT 197 12/27/2022   NEUTROABS 1.4 (L) 12/27/2022    Imaging:  VAS [place] LOWER EXTREMITY VENOUS (DVT)  Result Date: 12/25/2022  Lower Venous DVT Study Patient Name:  Jared [name]  Date of Exam:   12/25/2022 Medical Rec #: [id number]      Accession #:    [id number] Date of Birth: [birth year]      Patient Gender: M Patient Age:   77 years Exam [facility] Procedure:      VAS [place] LOWER EXTREMITY VENOUS (DVT) Referring Phys: Dorris Fetch BYERLY --------------------------------------------------------------------------------  Indications: Swelling.  Risk Factors: None identified. Limitations: Poor ultrasound/tissue interface. Comparison Study: No prior studies. Performing Technologist: Oliver Hum RVT  Examination  Guidelines: A complete evaluation includes B-mode imaging, spectral Doppler, color Doppler, and power Doppler as needed of all accessible portions of each vessel. Bilateral testing is considered an integral part of a complete examination. Limited examinations for reoccurring indications may be performed as noted. The reflux portion of the exam is performed with the patient in reverse Trendelenburg.  +---------+---------------+---------+-----------+----------+--------------+ RIGHT    CompressibilityPhasicitySpontaneityPropertiesThrombus Aging +---------+---------------+---------+-----------+----------+--------------+ CFV      Full           Yes      Yes                                 +---------+---------------+---------+-----------+----------+--------------+ SFJ      Full                                                        +---------+---------------+---------+-----------+----------+--------------+ FV Prox  Full                                                        +---------+---------------+---------+-----------+----------+--------------+ FV Mid   Full                                                        +---------+---------------+---------+-----------+----------+--------------+  FV DistalFull                                                        +---------+---------------+---------+-----------+----------+--------------+ PFV      Full                                                        +---------+---------------+---------+-----------+----------+--------------+ POP      Full           Yes      Yes                                 +---------+---------------+---------+-----------+----------+--------------+ PTV      Full                                                        +---------+---------------+---------+-----------+----------+--------------+ PERO     Full                                                         +---------+---------------+---------+-----------+----------+--------------+   +---------+---------------+---------+-----------+----------+--------------+ LEFT     CompressibilityPhasicitySpontaneityPropertiesThrombus Aging +---------+---------------+---------+-----------+----------+--------------+ CFV      Full           Yes      Yes                                 +---------+---------------+---------+-----------+----------+--------------+ SFJ      Full                                                        +---------+---------------+---------+-----------+----------+--------------+ FV Prox  Full                                                        +---------+---------------+---------+-----------+----------+--------------+ FV Mid   Full                                                        +---------+---------------+---------+-----------+----------+--------------+ FV DistalFull                                                        +---------+---------------+---------+-----------+----------+--------------+  PFV      Full                                                        +---------+---------------+---------+-----------+----------+--------------+ POP      Full           Yes      Yes                                 +---------+---------------+---------+-----------+----------+--------------+ PTV      Full                                                        +---------+---------------+---------+-----------+----------+--------------+ PERO     Full                                                        +---------+---------------+---------+-----------+----------+--------------+     Summary: RIGHT: - There is no evidence of deep vein thrombosis in the lower extremity.  - No cystic structure found in the popliteal fossa.  LEFT: - There is no evidence of deep vein thrombosis in the lower extremity.  - No cystic structure found in the popliteal fossa.   *See table(s) above for measurements and observations. Electronically signed by Deitra Mayo MD on 12/25/2022 at 2:51:40 PM.    Final     Medications: I have reviewed the patient's current medications.  Assessment/Plan: Pancreas cancer FNA biopsy of a pancreas body/tail mass 07/05/2022-adenocarcinoma CT abdomen/pelvis 06/14/2022-hypoenhancing pancreas body/tail mass with effacement of the splenic vein, no evidence of lymphadenopathy or metastatic disease CA 19-9 on 06/18/2022-767 EUS 07/05/2022-a 7 x 29 mm pancreas body/tail mass, abutment of the splenic artery, no malignant appearing lymph nodes, T2N0 by EUS CTs 07/15/2022-pancreas body/tail mass with no evidence of metastatic disease, splenic vein associated with the tumor, no arterial involvement, stable right greater than left lung nodules favored benign Cycle 1 FOLFOX 08/01/2022 Cycle 2 FOLFOX, 08/16/2022, neulasta Cycle 3 FOLFIRINOX 08/29/2022, Neulasta Cycle 4 FOLFIRINOX 09/12/2022, Neulasta, irinotecan and 5-fluorouracil dose reduced due to diarrhea/weight loss Cycle 5 FOLFIRINOX 09/26/2022 CT abdomen/pelvis 10/09/2022-no significant change in the appearance of the ill-defined pancreatic tail mass.  Persistent extrinsic mass effect on the splenic vein which remains patent.  Indeterminate left adrenal nodule. 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  Foundation 1-microsatellite stable, tumor mutation burden 1, K-ras G12R CTs 12/12/2022-increasing size of pancreatic mass with suspected metastatic disease involving the lesser sac and adjacent stomach.  New area of masslike nodularity anterior to the pancreatic neck appears to tether the gastric antrum.  Worsening of peritoneal disease along the inferior surface of the right hepatic margin.  Lesion along the margin of the sigmoid colon is favored to represent  a tumor implant.  Enlarging left adrenal gland in close proximity to the pancreatic lesion.  Scattered more conspicuous areas of peritoneal and omental  involvement.  Vascular involvement seen on previous imaging most notably of the splenic vein and splenic artery. Cycle 1 gemcitabine/Abraxane 12/13/2022 Chemotherapy held 12/27/2022 due to mild neutropenia Prostate cancer, status post prostatectomy 1990 Chronic elevation of the PSA-maintained on Trelstar, followed by Dr. Jeffie Pollock   3.  Kidney stones 4.   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  Abdominal pain, secondary to #1 Celiac plexus block 11/29/2022 6.  Family history of prostate and pancreas cancer 7.  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  Typhlitis in 2014 9.  Colon polyps-tubular adenomas on colonoscopy 03/27/2022 10.  Presentation emergency room 10/01/2022 and 10/02/2022 with nausea/vomiting, diarrhea, and chest pain-esophagitis    Disposition: Mr. Jared [name] appears stable.  He has completed 1 cycle of gemcitabine/Abraxane.  He tolerated well.  CBC today shows mild neutropenia.  We discussed options to include proceeding with treatment with dose reductions versus holding for 1 week and making a dose reduction at that time.  He is most comfortable canceling treatment today and rescheduling for 1 week.  He had lower extremity venous Doppler study yesterday for bilateral leg edema which returned negative for DVT.  He will return for lab, follow-up, Gemcitabine/Abraxane in 1 week.  He understands to contact the office in the interim with fever, chills, other signs of infection.    [name] ANP/GNP-BC   12/27/2022  10:39 AM

## 2022-12-27 NOTE — Progress Notes (Signed)
Patient seen by [name], NP today  Vitals are within treatment parameters.   Labs reviewed by [name], NP and are NOT within treatment parameters, ANC 1.4.  Per physician team, treatment will be held today.

## 2022-12-28 ENCOUNTER — Other Ambulatory Visit (HOSPITAL_BASED_OUTPATIENT_CLINIC_OR_DEPARTMENT_OTHER): Payer: Self-pay

## 2022-12-28 ENCOUNTER — Encounter: Payer: Self-pay | Admitting: Oncology

## 2022-12-28 ENCOUNTER — Other Ambulatory Visit: Payer: Self-pay

## 2022-12-31 ENCOUNTER — Ambulatory Visit (INDEPENDENT_AMBULATORY_CARE_PROVIDER_SITE_OTHER): Payer: Medicare HMO

## 2022-12-31 DIAGNOSIS — Z23 Encounter for immunization: Secondary | ICD-10-CM

## 2022-12-31 NOTE — Progress Notes (Signed)
Patient presents to nurse clinic for #2 Meningitis Vaccine.  Vaccine given without complication.  Patient has completed series.

## 2023-01-02 ENCOUNTER — Ambulatory Visit: Payer: Medicare HMO | Admitting: Nutrition

## 2023-01-02 ENCOUNTER — Encounter: Payer: Self-pay | Admitting: Nurse Practitioner

## 2023-01-02 NOTE — Progress Notes (Signed)
Contacted patient and wife by telephone for nutrition follow up. Patient receiving Gemcitabine and Abraxane for Pancreas cancer.  Patient weighed 212 pounds on Feb 29 and was noted to have pitting edema. This was decreased from 214 pounds on Feb 15 and 229 pounds on Nov 29.  Labs on Feb 29 included Glucose 140, BUN 6 and Albumin 3.5  Patient is scheduled for his next round of chemotherapy on Friday, March 8. Currently feels like things are improving. He is eating ,multiple meals and snacks. He tries to drink at least 2 cartons of Boost Plus daily. Reports he is using the bathroom more often.  Nutrition Diagnosis: Food and Nutrition Related Knowledge Deficit, improved.  Intervention: Continue small frequent meals and snacks. Continue Boost Plus 2-3 cartons daily. Continue bowel regimen.  Monitoring, Evaluation, Goals: Tolerate adequate calories and protein to minimize wt loss.  Next Visit; To be scheduled as needed.

## 2023-01-03 ENCOUNTER — Other Ambulatory Visit (HOSPITAL_BASED_OUTPATIENT_CLINIC_OR_DEPARTMENT_OTHER): Payer: Self-pay

## 2023-01-03 ENCOUNTER — Inpatient Hospital Stay: Payer: Medicare HMO

## 2023-01-03 ENCOUNTER — Inpatient Hospital Stay: Payer: Medicare HMO | Admitting: Nurse Practitioner

## 2023-01-03 ENCOUNTER — Encounter: Payer: Self-pay | Admitting: Nurse Practitioner

## 2023-01-03 ENCOUNTER — Inpatient Hospital Stay: Payer: Medicare HMO | Attending: Oncology

## 2023-01-03 VITALS — BP 121/67 | HR 63 | Temp 98.1°F | Resp 18 | Ht 72.0 in | Wt 213.0 lb

## 2023-01-03 DIAGNOSIS — D709 Neutropenia, unspecified: Secondary | ICD-10-CM | POA: Diagnosis not present

## 2023-01-03 DIAGNOSIS — C251 Malignant neoplasm of body of pancreas: Secondary | ICD-10-CM

## 2023-01-03 DIAGNOSIS — Z95828 Presence of other vascular implants and grafts: Secondary | ICD-10-CM

## 2023-01-03 DIAGNOSIS — Z452 Encounter for adjustment and management of vascular access device: Secondary | ICD-10-CM | POA: Insufficient documentation

## 2023-01-03 DIAGNOSIS — C786 Secondary malignant neoplasm of retroperitoneum and peritoneum: Secondary | ICD-10-CM | POA: Diagnosis not present

## 2023-01-03 DIAGNOSIS — Z8546 Personal history of malignant neoplasm of prostate: Secondary | ICD-10-CM | POA: Insufficient documentation

## 2023-01-03 DIAGNOSIS — B370 Candidal stomatitis: Secondary | ICD-10-CM | POA: Diagnosis not present

## 2023-01-03 DIAGNOSIS — C258 Malignant neoplasm of overlapping sites of pancreas: Secondary | ICD-10-CM | POA: Insufficient documentation

## 2023-01-03 LAB — CMP (CANCER CENTER ONLY)
ALT: 8 U/L (ref 0–44)
AST: 14 U/L — ABNORMAL LOW (ref 15–41)
Albumin: 3.4 g/dL — ABNORMAL LOW (ref 3.5–5.0)
Alkaline Phosphatase: 60 U/L (ref 38–126)
Anion gap: 8 (ref 5–15)
BUN: 8 mg/dL (ref 8–23)
CO2: 27 mmol/L (ref 22–32)
Calcium: 9.4 mg/dL (ref 8.9–10.3)
Chloride: 103 mmol/L (ref 98–111)
Creatinine: 0.64 mg/dL (ref 0.61–1.24)
GFR, Estimated: >60 mL/min (ref >60)
Glucose, Bld: 149 mg/dL — ABNORMAL HIGH (ref 70–99)
Potassium: 3.7 mmol/L (ref 3.5–5.1)
Sodium: 138 mmol/L (ref 135–145)
Total Bilirubin: 0.3 mg/dL (ref 0.3–1.2)
Total Protein: 5.9 g/dL — ABNORMAL LOW (ref 6.5–8.1)

## 2023-01-03 LAB — CBC WITH DIFFERENTIAL (CANCER CENTER ONLY)
Abs Immature Granulocytes: 0.01 10*3/uL (ref 0.00–0.07)
Basophils Absolute: 0.1 10*3/uL (ref 0.0–0.1)
Basophils Relative: 1 %
Eosinophils Absolute: 0.1 10*3/uL (ref 0.0–0.5)
Eosinophils Relative: 3 %
HCT: 34.9 % — ABNORMAL LOW (ref 39.0–52.0)
Hemoglobin: 11.4 g/dL — ABNORMAL LOW (ref 13.0–17.0)
Immature Granulocytes: 0 %
Lymphocytes Relative: 20 %
Lymphs Abs: 0.8 10*3/uL (ref 0.7–4.0)
MCH: 28.3 pg (ref 26.0–34.0)
MCHC: 32.7 g/dL (ref 30.0–36.0)
MCV: 86.6 fL (ref 80.0–100.0)
Monocytes Absolute: 0.7 10*3/uL (ref 0.1–1.0)
Monocytes Relative: 17 %
Neutro Abs: 2.4 10*3/uL (ref 1.7–7.7)
Neutrophils Relative %: 59 %
Platelet Count: 279 10*3/uL (ref 150–400)
RBC: 4.03 MIL/uL — ABNORMAL LOW (ref 4.22–5.81)
RDW: 14.5 % (ref 11.5–15.5)
WBC Count: 4.2 10*3/uL (ref 4.0–10.5)
nRBC: 0 % (ref 0.0–0.2)

## 2023-01-03 MED ORDER — HYDROMORPHONE HCL 4 MG PO TABS
4.0000 mg | ORAL_TABLET | ORAL | 0 refills | Status: DC | PRN
  Filled 2023-01-03: qty 150, 13d supply, fill #0

## 2023-01-03 MED ORDER — HEPARIN SOD (PORK) LOCK FLUSH 100 UNIT/ML IV SOLN
500.0000 [IU] | Freq: Once | INTRAVENOUS | Status: AC
  Administered 2023-01-03: 500 [IU] via INTRAVENOUS

## 2023-01-03 MED ORDER — SODIUM CHLORIDE 0.9% FLUSH
10.0000 mL | Freq: Once | INTRAVENOUS | Status: AC
  Administered 2023-01-03: 10 mL via INTRAVENOUS

## 2023-01-03 NOTE — Progress Notes (Signed)
[facility] OFFICE PROGRESS NOTE   Diagnosis: Pancreas cancer  INTERVAL HISTORY:   Jared [name] returns as scheduled.  Cycle 2 gemcitabine/Abraxane held 12/27/2022 due to neutropenia.  He denies nausea/vomiting.  Mouth sores.  No diarrhea.  Pain is well-controlled with a combination of a Duragesic patch and hydromorphone.  Bowels are moving.  He notes improvement in leg edema since beginning Lasix.  Objective:  Vital signs in last 24 hours:  Blood pressure 121/67, pulse 63, temperature 98.1 F (36.7 C), temperature source Oral, resp. rate 18, height 6' (1.829 m), weight 213 lb (96.6 kg), SpO2 98 %.    HEENT: White coating over tongue.  No buccal thrush. Resp: Lungs clear bilaterally. Cardio: Regular rate and rhythm. GI: Abdomen soft and nontender.  No hepatomegaly. Vascular: Pitting edema lower leg bilaterally. Port-A-Cath without erythema.  Lab Results:  Lab Results  Component Value Date   WBC 4.2 01/03/2023   HGB 11.4 (L) 01/03/2023   HCT 34.9 (L) 01/03/2023   MCV 86.6 01/03/2023   PLT 279 01/03/2023   NEUTROABS 2.4 01/03/2023    Imaging:  No results found.  Medications: I have reviewed the patient's current medications.  Assessment/Plan: Pancreas cancer FNA biopsy of a pancreas body/tail mass 07/05/2022-adenocarcinoma CT abdomen/pelvis 06/14/2022-hypoenhancing pancreas body/tail mass with effacement of the splenic vein, no evidence of lymphadenopathy or metastatic disease CA 19-9 on 06/18/2022-767 EUS 07/05/2022-a 7 x 29 mm pancreas body/tail mass, abutment of the splenic artery, no malignant appearing lymph nodes, T2N0 by EUS CTs 07/15/2022-pancreas body/tail mass with no evidence of metastatic disease, splenic vein associated with the tumor, no arterial involvement, stable right greater than left lung nodules favored benign Cycle 1 FOLFOX 08/01/2022 Cycle 2 FOLFOX, 08/16/2022, neulasta Cycle 3 FOLFIRINOX 08/29/2022, Neulasta Cycle 4 FOLFIRINOX 09/12/2022,  Neulasta, irinotecan and 5-fluorouracil dose reduced due to diarrhea/weight loss Cycle 5 FOLFIRINOX 09/26/2022 CT abdomen/pelvis 10/09/2022-no significant change in the appearance of the ill-defined pancreatic tail mass.  Persistent extrinsic mass effect on the splenic vein which remains patent.  Indeterminate left adrenal nodule. 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  Foundation 1-microsatellite stable, tumor mutation burden 1, K-ras G12R CTs 12/12/2022-increasing size of pancreatic mass with suspected metastatic disease involving the lesser sac and adjacent stomach.  New area of masslike nodularity anterior to the pancreatic neck appears to tether the gastric antrum.  Worsening of peritoneal disease along the inferior surface of the right hepatic margin.  Lesion along the margin of the sigmoid colon is favored to represent a tumor implant.  Enlarging left adrenal gland in close proximity to the pancreatic lesion.  Scattered more conspicuous areas of peritoneal and omental involvement.  Vascular involvement seen on previous imaging most notably of the splenic vein and splenic artery. Cycle 1 gemcitabine/Abraxane 12/13/2022 Chemotherapy held 12/27/2022 due to mild neutropenia Cycle 2 gemcitabine/Abraxane 01/03/2023 Prostate cancer, status post prostatectomy 1990 Chronic elevation of the PSA-maintained on Trelstar, followed by Dr. Jeffie Pollock   3.  Kidney stones 4.   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  Abdominal pain, secondary to #1 Celiac plexus block 11/29/2022 6.  Family history of prostate and pancreas cancer 7.  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  Typhlitis in 2014 9.  Colon polyps-tubular adenomas on colonoscopy 03/27/2022 10.  Presentation emergency room  10/01/2022 and 10/02/2022 with nausea/vomiting, diarrhea, and chest pain-esophagitis  Disposition: Mr. Pettaway appears stable.  He has completed 1 cycle of gemcitabine/Abraxane.  Chemotherapy was held last week due to neutropenia.  CBC reviewed.  Counts are adequate to proceed with treatment tomorrow as scheduled.  Chemotherapy doses will be reduced due to the neutropenia last week.  He will return for lab, follow-up, chemotherapy in 2 weeks.    [name] ANP/GNP-BC   01/03/2023  1:27 PM

## 2023-01-03 NOTE — Patient Instructions (Signed)

## 2023-01-04 ENCOUNTER — Other Ambulatory Visit: Payer: Self-pay | Admitting: Nurse Practitioner

## 2023-01-04 ENCOUNTER — Other Ambulatory Visit (HOSPITAL_BASED_OUTPATIENT_CLINIC_OR_DEPARTMENT_OTHER): Payer: Self-pay

## 2023-01-04 ENCOUNTER — Inpatient Hospital Stay: Payer: Medicare HMO

## 2023-01-04 ENCOUNTER — Other Ambulatory Visit: Payer: Self-pay

## 2023-01-04 ENCOUNTER — Encounter: Payer: Self-pay | Admitting: Oncology

## 2023-01-04 VITALS — BP 105/77 | HR 60 | Temp 98.2°F | Resp 18

## 2023-01-04 DIAGNOSIS — L270 Generalized skin eruption due to drugs and medicaments taken internally: Secondary | ICD-10-CM | POA: Diagnosis not present

## 2023-01-04 DIAGNOSIS — L03116 Cellulitis of left lower limb: Secondary | ICD-10-CM | POA: Diagnosis not present

## 2023-01-04 DIAGNOSIS — C251 Malignant neoplasm of body of pancreas: Secondary | ICD-10-CM

## 2023-01-04 DIAGNOSIS — B370 Candidal stomatitis: Secondary | ICD-10-CM

## 2023-01-04 MED ORDER — HEPARIN SOD (PORK) LOCK FLUSH 100 UNIT/ML IV SOLN
500.0000 [IU] | Freq: Once | INTRAVENOUS | Status: AC | PRN
  Administered 2023-01-04: 500 [IU]

## 2023-01-04 MED ORDER — PACLITAXEL PROTEIN-BOUND CHEMO INJECTION 100 MG
100.0000 mg/m2 | Freq: Once | INTRAVENOUS | Status: AC
  Administered 2023-01-04: 225 mg via INTRAVENOUS
  Filled 2023-01-04: qty 45

## 2023-01-04 MED ORDER — NYSTATIN 100000 UNIT/ML MT SUSP
5.0000 mL | Freq: Four times a day (QID) | OROMUCOSAL | 0 refills | Status: DC
  Filled 2023-01-04: qty 150, 8d supply, fill #0

## 2023-01-04 MED ORDER — PROCHLORPERAZINE MALEATE 10 MG PO TABS
10.0000 mg | ORAL_TABLET | Freq: Once | ORAL | Status: AC
  Administered 2023-01-04: 10 mg via ORAL
  Filled 2023-01-04: qty 1

## 2023-01-04 MED ORDER — SODIUM CHLORIDE 0.9 % IV SOLN
800.0000 mg/m2 | Freq: Once | INTRAVENOUS | Status: AC
  Administered 2023-01-04: 1786 mg via INTRAVENOUS
  Filled 2023-01-04: qty 26.28

## 2023-01-04 MED ORDER — SODIUM CHLORIDE 0.9% FLUSH
10.0000 mL | INTRAVENOUS | Status: DC | PRN
  Administered 2023-01-04: 10 mL

## 2023-01-04 MED ORDER — SODIUM CHLORIDE 0.9 % IV SOLN: Freq: Once | INTRAVENOUS | Status: AC

## 2023-01-04 NOTE — Patient Instructions (Signed)
[facility]  Discharge Instructions: Thank you for choosing [facility] to provide your oncology and hematology care.   If you have a lab appointment with the [facility], please go directly to the [facility] and check in at the registration area.   Wear comfortable clothing and clothing appropriate for easy access to any Portacath or PICC line.   We strive to give you quality time with your provider. You may need to reschedule your appointment if you arrive late (15 or more minutes).  Arriving late affects you and other patients whose appointments are after yours.  Also, if you miss three or more appointments without notifying the office, you may be dismissed from the clinic at the provider's discretion.      For prescription refill requests, have your pharmacy contact our office and allow 72 hours for refills to be completed.    Today you received the following chemotherapy and/or immunotherapy agents Abraxane, Gemzar    To help prevent nausea and vomiting after your treatment, we encourage you to take your nausea medication as directed.  BELOW ARE SYMPTOMS THAT SHOULD BE REPORTED IMMEDIATELY: *FEVER GREATER THAN 100.4 F (38 C) OR HIGHER *CHILLS OR SWEATING *NAUSEA AND VOMITING THAT IS NOT CONTROLLED WITH YOUR NAUSEA MEDICATION *UNUSUAL SHORTNESS OF BREATH *UNUSUAL BRUISING OR BLEEDING *URINARY PROBLEMS (pain or burning when urinating, or frequent urination) *BOWEL PROBLEMS (unusual diarrhea, constipation, pain near the anus) TENDERNESS IN MOUTH AND THROAT WITH OR WITHOUT PRESENCE OF ULCERS (sore throat, sores in mouth, or a toothache) UNUSUAL RASH, SWELLING OR PAIN  UNUSUAL VAGINAL DISCHARGE OR ITCHING   Items with * indicate a potential emergency and should be followed up as soon as possible or go to the Emergency Department if any problems should occur.  Please show the CHEMOTHERAPY ALERT CARD or IMMUNOTHERAPY ALERT CARD at  check-in to the Emergency Department and triage nurse.  Should you have questions after your visit or need to cancel or reschedule your appointment, please contact [facility]  Dept: [phone]  and follow the prompts.  Office hours are 8:00 a.m. to 4:30 p.m. Monday - Friday. Please note that voicemails left after 4:00 p.m. may not be returned until the following business day.  We are closed weekends and major holidays. You have access to a nurse at all times for urgent questions. Please call the main number to the clinic Dept: [phone] and follow the prompts.   For any non-urgent questions, you may also contact your provider using MyChart. We now offer e-Visits for anyone 70 and older to request care online for non-urgent symptoms. For details visit [email or web address].   Also download the MyChart app! Go to the app store, search "MyChart", open the app, select [facility], and log in with your MyChart username and password.

## 2023-01-04 NOTE — Addendum Note (Signed)
Addended by: Betsy Coder B on: 01/04/2023 09:45 AM   Modules accepted: Orders

## 2023-01-05 ENCOUNTER — Other Ambulatory Visit (HOSPITAL_BASED_OUTPATIENT_CLINIC_OR_DEPARTMENT_OTHER): Payer: Self-pay

## 2023-01-07 ENCOUNTER — Emergency Department (HOSPITAL_BASED_OUTPATIENT_CLINIC_OR_DEPARTMENT_OTHER): Payer: Medicare HMO

## 2023-01-07 ENCOUNTER — Other Ambulatory Visit: Payer: Self-pay

## 2023-01-07 ENCOUNTER — Telehealth: Payer: Self-pay

## 2023-01-07 ENCOUNTER — Inpatient Hospital Stay (HOSPITAL_BASED_OUTPATIENT_CLINIC_OR_DEPARTMENT_OTHER)
Admission: EM | Admit: 2023-01-07 | Discharge: 2023-01-13 | DRG: 607 | Disposition: A | Discharge status: Home / Self Care | Payer: Medicare HMO | Attending: Family Medicine | Admitting: Family Medicine

## 2023-01-07 DIAGNOSIS — E119 Type 2 diabetes mellitus without complications: Secondary | ICD-10-CM | POA: Diagnosis present

## 2023-01-07 DIAGNOSIS — Z8546 Personal history of malignant neoplasm of prostate: Secondary | ICD-10-CM | POA: Diagnosis not present

## 2023-01-07 DIAGNOSIS — C786 Secondary malignant neoplasm of retroperitoneum and peritoneum: Secondary | ICD-10-CM | POA: Diagnosis present

## 2023-01-07 DIAGNOSIS — Z1152 Encounter for screening for COVID-19: Secondary | ICD-10-CM

## 2023-01-07 DIAGNOSIS — C251 Malignant neoplasm of body of pancreas: Secondary | ICD-10-CM

## 2023-01-07 DIAGNOSIS — E8809 Other disorders of plasma-protein metabolism, not elsewhere classified: Secondary | ICD-10-CM | POA: Diagnosis present

## 2023-01-07 DIAGNOSIS — L03116 Cellulitis of left lower limb: Secondary | ICD-10-CM | POA: Diagnosis present

## 2023-01-07 DIAGNOSIS — Z79899 Other long term (current) drug therapy: Secondary | ICD-10-CM | POA: Diagnosis not present

## 2023-01-07 DIAGNOSIS — E785 Hyperlipidemia, unspecified: Secondary | ICD-10-CM | POA: Diagnosis present

## 2023-01-07 DIAGNOSIS — L270 Generalized skin eruption due to drugs and medicaments taken internally: Secondary | ICD-10-CM | POA: Diagnosis present

## 2023-01-07 DIAGNOSIS — D6959 Other secondary thrombocytopenia: Secondary | ICD-10-CM | POA: Diagnosis present

## 2023-01-07 DIAGNOSIS — H409 Unspecified glaucoma: Secondary | ICD-10-CM | POA: Diagnosis present

## 2023-01-07 DIAGNOSIS — R590 Localized enlarged lymph nodes: Secondary | ICD-10-CM | POA: Diagnosis present

## 2023-01-07 DIAGNOSIS — B370 Candidal stomatitis: Secondary | ICD-10-CM | POA: Diagnosis present

## 2023-01-07 DIAGNOSIS — E876 Hypokalemia: Secondary | ICD-10-CM | POA: Diagnosis present

## 2023-01-07 DIAGNOSIS — T451X5A Adverse effect of antineoplastic and immunosuppressive drugs, initial encounter: Secondary | ICD-10-CM | POA: Diagnosis present

## 2023-01-07 DIAGNOSIS — K8681 Exocrine pancreatic insufficiency: Secondary | ICD-10-CM | POA: Diagnosis present

## 2023-01-07 DIAGNOSIS — G893 Neoplasm related pain (acute) (chronic): Secondary | ICD-10-CM | POA: Diagnosis present

## 2023-01-07 DIAGNOSIS — D638 Anemia in other chronic diseases classified elsewhere: Secondary | ICD-10-CM | POA: Diagnosis present

## 2023-01-07 DIAGNOSIS — Z713 Dietary counseling and surveillance: Secondary | ICD-10-CM

## 2023-01-07 DIAGNOSIS — D849 Immunodeficiency, unspecified: Secondary | ICD-10-CM | POA: Diagnosis present

## 2023-01-07 DIAGNOSIS — M1990 Unspecified osteoarthritis, unspecified site: Secondary | ICD-10-CM | POA: Diagnosis present

## 2023-01-07 DIAGNOSIS — Z7189 Other specified counseling: Secondary | ICD-10-CM | POA: Diagnosis not present

## 2023-01-07 DIAGNOSIS — E871 Hypo-osmolality and hyponatremia: Secondary | ICD-10-CM | POA: Diagnosis present

## 2023-01-07 DIAGNOSIS — Z6828 Body mass index (BMI) 28.0-28.9, adult: Secondary | ICD-10-CM

## 2023-01-07 DIAGNOSIS — Z515 Encounter for palliative care: Secondary | ICD-10-CM | POA: Diagnosis not present

## 2023-01-07 DIAGNOSIS — Z8042 Family history of malignant neoplasm of prostate: Secondary | ICD-10-CM

## 2023-01-07 DIAGNOSIS — C252 Malignant neoplasm of tail of pancreas: Secondary | ICD-10-CM | POA: Diagnosis not present

## 2023-01-07 DIAGNOSIS — I700 Atherosclerosis of aorta: Secondary | ICD-10-CM | POA: Diagnosis present

## 2023-01-07 DIAGNOSIS — Z9049 Acquired absence of other specified parts of digestive tract: Secondary | ICD-10-CM | POA: Diagnosis not present

## 2023-01-07 DIAGNOSIS — M7989 Other specified soft tissue disorders: Secondary | ICD-10-CM | POA: Diagnosis present

## 2023-01-07 DIAGNOSIS — Z79891 Long term (current) use of opiate analgesic: Secondary | ICD-10-CM

## 2023-01-07 DIAGNOSIS — K219 Gastro-esophageal reflux disease without esophagitis: Secondary | ICD-10-CM | POA: Diagnosis present

## 2023-01-07 DIAGNOSIS — Z888 Allergy status to other drugs, medicaments and biological substances status: Secondary | ICD-10-CM

## 2023-01-07 DIAGNOSIS — E663 Overweight: Secondary | ICD-10-CM | POA: Diagnosis present

## 2023-01-07 DIAGNOSIS — Z87891 Personal history of nicotine dependence: Secondary | ICD-10-CM

## 2023-01-07 DIAGNOSIS — Z9221 Personal history of antineoplastic chemotherapy: Secondary | ICD-10-CM

## 2023-01-07 LAB — COMPREHENSIVE METABOLIC PANEL
ALT: 9 U/L (ref 0–44)
AST: 20 U/L (ref 15–41)
Albumin: 3.4 g/dL — ABNORMAL LOW (ref 3.5–5.0)
Alkaline Phosphatase: 54 U/L (ref 38–126)
Anion gap: 6 (ref 5–15)
BUN: 8 mg/dL (ref 8–23)
CO2: 28 mmol/L (ref 22–32)
Calcium: 8.7 mg/dL — ABNORMAL LOW (ref 8.9–10.3)
Chloride: 99 mmol/L (ref 98–111)
Creatinine, Ser: 0.49 mg/dL — ABNORMAL LOW (ref 0.61–1.24)
GFR, Estimated: >60 mL/min (ref >60)
Glucose, Bld: 151 mg/dL — ABNORMAL HIGH (ref 70–99)
Potassium: 4.1 mmol/L (ref 3.5–5.1)
Sodium: 133 mmol/L — ABNORMAL LOW (ref 135–145)
Total Bilirubin: 0.6 mg/dL (ref 0.3–1.2)
Total Protein: 5.6 g/dL — ABNORMAL LOW (ref 6.5–8.1)

## 2023-01-07 LAB — CBC WITH DIFFERENTIAL/PLATELET
Abs Immature Granulocytes: 0.01 10*3/uL (ref 0.00–0.07)
Basophils Absolute: 0 10*3/uL (ref 0.0–0.1)
Basophils Relative: 1 %
Eosinophils Absolute: 0.2 10*3/uL (ref 0.0–0.5)
Eosinophils Relative: 3 %
HCT: 31.7 % — ABNORMAL LOW (ref 39.0–52.0)
Hemoglobin: 10.5 g/dL — ABNORMAL LOW (ref 13.0–17.0)
Immature Granulocytes: 0 %
Lymphocytes Relative: 3 %
Lymphs Abs: 0.2 10*3/uL — ABNORMAL LOW (ref 0.7–4.0)
MCH: 28.3 pg (ref 26.0–34.0)
MCHC: 33.1 g/dL (ref 30.0–36.0)
MCV: 85.4 fL (ref 80.0–100.0)
Monocytes Absolute: 0.1 10*3/uL (ref 0.1–1.0)
Monocytes Relative: 1 %
Neutro Abs: 6.1 10*3/uL (ref 1.7–7.7)
Neutrophils Relative %: 92 %
Platelets: 123 10*3/uL — ABNORMAL LOW (ref 150–400)
RBC: 3.71 MIL/uL — ABNORMAL LOW (ref 4.22–5.81)
RDW: 14.5 % (ref 11.5–15.5)
WBC: 6.7 10*3/uL (ref 4.0–10.5)
nRBC: 0 % (ref 0.0–0.2)

## 2023-01-07 LAB — RESP PANEL BY RT-PCR (RSV, FLU A&B, COVID)  RVPGX2
Influenza A by PCR: NEGATIVE
Influenza B by PCR: NEGATIVE
Resp Syncytial Virus by PCR: NEGATIVE
SARS Coronavirus 2 by RT PCR: NEGATIVE

## 2023-01-07 LAB — PROTIME-INR
INR: 1.1 (ref 0.8–1.2)
Prothrombin Time: 14.3 seconds (ref 11.4–15.2)

## 2023-01-07 LAB — LACTIC ACID, PLASMA: Lactic Acid, Venous: 1.3 mmol/L (ref 0.5–1.9)

## 2023-01-07 LAB — APTT: aPTT: 36 seconds (ref 24–36)

## 2023-01-07 MED ORDER — ACETAMINOPHEN 325 MG PO TABS
650.0000 mg | ORAL_TABLET | Freq: Four times a day (QID) | ORAL | Status: DC | PRN
  Administered 2023-01-08 – 2023-01-10 (×7): 650 mg via ORAL
  Filled 2023-01-07 (×8): qty 2

## 2023-01-07 MED ORDER — ENOXAPARIN SODIUM 40 MG/0.4ML IJ SOSY
40.0000 mg | PREFILLED_SYRINGE | INTRAMUSCULAR | Status: DC
  Administered 2023-01-08 – 2023-01-13 (×6): 40 mg via SUBCUTANEOUS
  Filled 2023-01-07 (×6): qty 0.4

## 2023-01-07 MED ORDER — LACTATED RINGERS IV SOLN: INTRAVENOUS | Status: AC

## 2023-01-07 MED ORDER — ACETAMINOPHEN 650 MG RE SUPP: 650.0000 mg | Freq: Four times a day (QID) | RECTAL | Status: DC | PRN

## 2023-01-07 MED ORDER — MELATONIN 3 MG PO TABS: 3.0000 mg | ORAL_TABLET | Freq: Every evening | ORAL | Status: DC | PRN

## 2023-01-07 MED ORDER — SODIUM CHLORIDE 0.9 % IV SOLN
1.0000 g | Freq: Once | INTRAVENOUS | Status: AC
  Administered 2023-01-07: 1 g via INTRAVENOUS
  Filled 2023-01-07: qty 10

## 2023-01-07 MED ORDER — ONDANSETRON HCL 4 MG/2ML IJ SOLN
4.0000 mg | Freq: Four times a day (QID) | INTRAMUSCULAR | Status: DC | PRN
  Administered 2023-01-08 – 2023-01-13 (×12): 4 mg via INTRAVENOUS
  Filled 2023-01-07 (×12): qty 2

## 2023-01-07 MED ORDER — SODIUM CHLORIDE 0.9 % IV SOLN
1.0000 g | INTRAVENOUS | Status: DC
  Administered 2023-01-08: 1 g via INTRAVENOUS
  Filled 2023-01-07 (×2): qty 10

## 2023-01-07 NOTE — ED Notes (Signed)
Informed by [facility] that pt.'s wife told them she gave him 1 '4mg'$  Hydromorphone just before their arrival. This was ok'd by PA.

## 2023-01-07 NOTE — ED Notes (Signed)
Per EDP, no fluids/ abx at this time.

## 2023-01-07 NOTE — ED Notes (Signed)
Called [facility] to transport patient to [facility] [phone]

## 2023-01-07 NOTE — ED Triage Notes (Addendum)
Patient here POV from Home.  Endorses Bilateral Leg Swelling that was noted somewhat yesterday but worsened today especially. More so Left leg is affected and somewhat red.  101.9 Temperature Last PM. Currently under Chemotherapy for Pancreatic Cancer.   Tylenol at 1600.  NAD noted during Triage. A&Ox4. GCS 15. Ambulatory.

## 2023-01-07 NOTE — ED Notes (Signed)
Urinal given, advised that urine sample is needed.

## 2023-01-07 NOTE — Progress Notes (Signed)
Plan of Care Note for accepted transfer   Patient: Jared [name] MRN: [id number]   DOA: 01/07/2023  Facility requesting transfer: [facility]   Requesting Provider: Dr. Tamera Punt   Reason for transfer: Cellulitis   Facility course: 77 yr old man on chemotherapy for pancreatitic cancer who presents with fever and L>R lower leg redness and swelling.   Venous US of LEs negative for DVT but notable for left inguinal adenopathy.   Blood cultures were obtained and he was treated with Rocephin.   Plan of care: The patient is accepted for admission to [place]  unit, at [facility]   Author: Vianne Bulls, MD 01/07/2023  Check [email or web address] for on-call coverage.  Nursing staff, Please call [facility] number on Amion as soon as patient's arrival, so appropriate admitting provider can evaluate the pt.

## 2023-01-07 NOTE — ED Notes (Signed)
Called [facility] to transport patient to [facility] Rm# [id number]--Jared [name]

## 2023-01-07 NOTE — H&P (Signed)
History and Physical      Jared [name] [id number] DOB: 1946/07/29 DOA: 01/07/2023  PCP: [name], MD *** Patient coming from: home ***  I have personally briefly reviewed patient's old medical records in [facility]  Chief Complaint: ***  HPI: Jared [name] is a 77 y.o. male with medical history significant for *** who is admitted to [facility] on 01/07/2023 with *** after presenting from home*** to [facility] ED complaining of ***.   ***        ***  ED Course:  Vital signs in the ED were notable for the following: ***  Labs were notable for the following: ***  Per my interpretation, EKG in ED demonstrated the following:  ***  Imaging and additional notable ED work-up: ***  While in the ED, the following were administered: ***  Subsequently, the patient was admitted  ***  ***red   Review of Systems: As per HPI otherwise 10 point review of systems negative.   Past Medical History:  Diagnosis Date   Aortic atherosclerosis ([place]) 10/02/2022   Arthritis    SHOULDER & KNEES   Bilateral cataracts 10/02/2022   Cataract    bilateral sx   Choledocholithiasis 07/23/2022   Chronic kidney disease    pt states he does not have kidney disease, just a hx of kidney stones   GERD 02/22/2009   Qualifier: Diagnosis of   By: Carlean Purl MD, Tonna Boehringer E      Glaucoma 10/02/2022   History of kidney stones    Hyperlipidemia 09/06/2021   The 10-year ASCVD risk score (Arnett DK, et al., 2019) is: 12.5%    Values used to calculate the score:      Age: 56 years      Sex: Male      Is Non-Hispanic African American: Yes      Diabetic: No      Tobacco smoker: No      Systolic Blood Pressure: 0000000 mmHg      Is BP treated: No      HDL Cholesterol: 82 mg/dL      Total Cholesterol: 241 mg/dL      Inguinal hernia    right   Leukocytosis 10/02/2022   Pancreatic cancer ([place]) 2023   Prostate cancer ([place]) 10/29/1988    Past Surgical History:  Procedure Laterality Date    CATARACT EXTRACTION Bilateral    CHOLECYSTECTOMY N/A 07/14/2013   Procedure: LAPAROSCOPIC CHOLECYSTECTOMY;  Surgeon: Stark Klein, MD;  Location: [facility];  Service: General;  Laterality: N/A;   COLONOSCOPY  01/07/2004   Dr. Silvano Rusk   COLONOSCOPY  02/2022   CG-MAC-mira(good)-tics-2 polyps   ENDOSCOPIC RETROGRADE CHOLANGIOPANCREATOGRAPHY (ERCP) WITH PROPOFOL N/A 08/23/2022   Procedure: ENDOSCOPIC RETROGRADE CHOLANGIOPANCREATOGRAPHY (ERCP) WITH PROPOFOL;  Surgeon: Irving Copas., MD;  Location: [facility];  Service: Gastroenterology;  Laterality: N/A;   ESOPHAGOGASTRODUODENOSCOPY (EGD) WITH PROPOFOL N/A 07/05/2022   Procedure: ESOPHAGOGASTRODUODENOSCOPY (EGD) WITH PROPOFOL;  Surgeon: Rush Landmark Telford Nab., MD;  Location: [facility];  Service: Gastroenterology;  Laterality: N/A;   EUS N/A 07/05/2022   Procedure: UPPER ENDOSCOPIC ULTRASOUND (EUS) RADIAL;  Surgeon: Irving Copas., MD;  Location: [facility];  Service: Gastroenterology;  Laterality: N/A;   EYE SURGERY     FINE NEEDLE ASPIRATION  07/05/2022   Procedure: FINE NEEDLE ASPIRATION (FNA) LINEAR;  Surgeon: Irving Copas., MD;  Location: [facility];  Service: Gastroenterology;;   INGUINAL HERNIA REPAIR Right    LAPAROSCOPY N/A 11/27/2022  Procedure: DIAGNOSTIC LAPAROSCOPY;  Surgeon: Stark Klein, MD;  Location: [facility];  Service: General;  Laterality: N/A;  EPIDURAL   LAPAROTOMY N/A 11/27/2022   Procedure: EXPLORATORY LAPAROTOMY;  Surgeon: Stark Klein, MD;  Location: [facility];  Service: General;  Laterality: N/A;   OPERATIVE ULTRASOUND N/A 11/27/2022   Procedure: INTRAOPERATIVE ULTRASOUND;  Surgeon: Stark Klein, MD;  Location: [facility];  Service: General;  Laterality: N/A;   PORTACATH PLACEMENT Left 07/31/2022   Procedure: INSERTION PORT-A-CATH;  Surgeon: Stark Klein, MD;  Location: [facility];  Service: General;  Laterality: Left;   PROSTATECTOMY     REMOVAL OF STONES  08/23/2022   Procedure: REMOVAL OF  STONES;  Surgeon: Irving Copas., MD;  Location: [facility];  Service: Gastroenterology;;   Joan Mayans  08/23/2022   Procedure: SPHINCTEROTOMY;  Surgeon: Irving Copas., MD;  Location: [facility];  Service: Gastroenterology;;   TONSILLECTOMY      Social History:  reports that he quit smoking about 41 years ago. His smoking use included cigarettes. He has a 10.00 pack-year smoking history. He has never used smokeless tobacco. He reports that he does not drink alcohol and does not use drugs.   Allergies  Allergen Reactions   Claritin-D 24 Hour [Loratadine-Pseudoephedrine Er] Other (See Comments)    Eye pressure issues   Lupron [Leuprolide] Itching and Other (See Comments)    Caused infection on hip     Family History  Problem Relation Age of Onset   Pancreatic cancer Mother 92   Kidney cancer Mother 60   Thyroid cancer Mother 19   Prostate cancer Father 42   Cancer Sister 38       carcinoid tumor   Prostate cancer Brother 58   Prostate cancer Brother 75   Thyroid cancer Brother 20   Prostate cancer Brother 41   Prostate cancer Brother 57   Pancreatic cancer Paternal Aunt 77   Pancreatic cancer Paternal Aunt 10   Prostate cancer Paternal Uncle 71   Prostate cancer Paternal Uncle 30   Cervical cancer Maternal Grandmother 32   Prostate cancer Paternal Grandfather 88   Colon cancer Neg Hx    Stomach cancer Neg Hx    Esophageal cancer Neg Hx    Colon polyps Neg Hx    Rectal cancer Neg Hx     Family history reviewed and not pertinent ***   Prior to Admission medications   Medication Sig Start Date End Date Taking? Authorizing Provider  acetaminophen (TYLENOL) 325 MG tablet Take 325 mg by mouth daily as needed for headache. Patient not taking: Reported on 12/06/2022    [provider]  Ascorbic Acid (VITAMIN C) 500 MG tablet Take 500 mg by mouth daily.   Patient not taking: Reported on 12/06/2022    [provider]  brimonidine  (ALPHAGAN) 0.15 % ophthalmic solution Place 1 drop into the left eye in the morning and at bedtime.    [provider]  Calcium Carbonate Antacid (TUMS ULTRA 1000 PO) Take 2,000-3,000 mg by mouth See admin instructions. 3000 mg by mouth in the morning and 2000 mg by mouth at night    [provider]  Carboxymethylcell-Glycerin PF (REFRESH OPTIVE PF) 0.5-0.9 % SOLN Place 1 drop into both eyes as needed (dry eyes). 04/28/18   [provider]  cholecalciferol (VITAMIN D3) 25 MCG (1000 UNIT) tablet Take 1,000 Units by mouth daily.    [provider]  Docusate Sodium (COLACE PO) Take 1 tablet by mouth in the  morning, at noon, and at bedtime. 11/15/22   [provider]  Dorzolamide HCl-Timolol Mal PF 2-0.5 % SOLN Place 1 drop into the left eye 2 (two) times daily. 12/01/21   [provider]  fentaNYL (DURAGESIC) 50 MCG/HR Place 1 patch onto the skin every 3 (three) days. 12/27/22 01/27/23  Owens Shark, NP  furosemide (LASIX) 20 MG tablet Take 20 mg by mouth daily. X 7 days 12/24/22 12/24/23  [provider]  Garlic [id number] MG CAPS Take 1,000 mg by mouth daily. Patient not taking: Reported on 12/06/2022    [provider]  GEMTESA 75 MG TABS Take 75 mg by mouth daily. 01/31/22   [provider]  HYDROmorphone (DILAUDID) 4 MG tablet Take 1-2 tablets (4-8 mg total) by mouth every 4 (four) hours as needed for severe pain. 01/03/23   Owens Shark, NP  lidocaine-prilocaine (EMLA) cream Apply 1 Application topically as needed. 12/11/22   Ladell Pier, MD  nystatin (MYCOSTATIN) 100000 UNIT/ML suspension Take 5 mLs (500,000 Units total) by mouth 4 (four) times daily for 7 days. 01/04/23 01/12/23  Owens Shark, NP  olopatadine (PATANOL) 0.1 % ophthalmic solution Place 1 drop into both eyes as needed for allergies.    [provider]  ondansetron (ZOFRAN) 8 MG tablet Take 1 tablet (8 mg total) by mouth every 8 (eight) hours as needed for  nausea or vomiting (Starting day 3 after chemo as needed for nausea(received Aloxi with chemo)). 09/10/22   Ladell Pier, MD  pantoprazole (PROTONIX) 40 MG tablet Take 1 tablet (40 mg total) by mouth 2 (two) times daily. 12/06/22   Owens Shark, NP  Polyethylene Glycol 3350 (MIRALAX PO) Take 17 g by mouth daily at 6 (six) AM. Patient not taking: Reported on 12/27/2022    [provider]  potassium chloride SA (KLOR-CON M) 20 MEQ tablet Take 1 tablet (20 mEq total) by mouth daily. 12/06/22   Owens Shark, NP  prochlorperazine (COMPAZINE) 10 MG tablet Take 1 tablet (10 mg total) by mouth every 6 (six) hours as needed for nausea or vomiting. 10/07/22   Eugenie Filler, MD  sodium chloride (AYR) 0.65 % nasal spray Place 2-3 sprays into the nose as needed for congestion. Patient not taking: Reported on 12/27/2022    [provider]  tiZANidine (ZANAFLEX) 4 MG tablet Take 1 tablet (4 mg total) by mouth every 8 (eight) hours as needed for muscle spasms. 11/30/22   Stark Klein, MD  Triptorelin Pamoate 11.25 MG SUSR Inject 11.25 mg into the muscle as needed (PSA). Patient not taking: Reported on 12/27/2022    [provider]     Objective    Physical Exam: Vitals:   01/07/23 2100 01/07/23 2115 01/07/23 2130 01/07/23 2259  BP: 107/73 (!) 111/97 116/75 112/74  Pulse: 71 81  73  Resp: 19 (!) 27 (!) 24 20  Temp:    99 F (37.2 C)  TempSrc:    Oral  SpO2: 98% 99%  (!) 84%  Weight:      Height:        General: appears to be stated age; alert, oriented Skin: warm, dry, no rash Head:  AT/[place] Mouth:  Oral mucosa membranes appear moist, normal dentition Neck: supple; trachea midline Heart:  RRR; did not appreciate any M/R/G Lungs: CTAB, did not appreciate any wheezes, rales, or rhonchi Abdomen: + BS; soft, ND, NT Vascular: 2+ pedal pulses b/l; 2+ radial pulses b/l Extremities: no  peripheral edema, no muscle wasting Neuro: strength and sensation intact in upper and  lower extremities b/l    *** Neuro: 5/5 strength of the proximal and distal flexors and extensors of the upper and lower extremities bilaterally; sensation intact in upper and lower extremities b/l; cranial nerves II through XII grossly intact; no pronator drift; no evidence suggestive of slurred speech, dysarthria, or facial droop; Normal muscle tone. No tremors. *** Neuro: In the setting of the patient's current mental status and associated inability to follow instructions, unable to perform full neurologic exam at this time.  As such, assessment of strength, sensation, and cranial nerves is limited at this time. Patient noted to spontaneously move all 4 extremities. No tremors.  ***    Labs on Admission: I have personally reviewed following labs and imaging studies  CBC: Recent Labs  Lab 01/03/23 1310 01/07/23 1815  WBC 4.2 6.7  NEUTROABS 2.4 6.1  HGB 11.4* 10.5*  HCT 34.9* 31.7*  MCV 86.6 85.4  PLT 279 [id number]*   Basic Metabolic Panel: Recent Labs  Lab 01/03/23 1310 01/07/23 1815  NA 138 133*  K 3.7 4.1  CL 103 99  CO2 27 28  GLUCOSE 149* 151*  BUN 8 8  CREATININE 0.64 0.49*  CALCIUM 9.4 8.7*   GFR: Estimated Creatinine Clearance: 92.6 mL/min (A) (by C-G formula based on SCr of 0.49 mg/dL (L)). Liver Function Tests: Recent Labs  Lab 01/03/23 1310 01/07/23 1815  AST 14* 20  ALT 8 9  ALKPHOS 60 54  BILITOT 0.3 0.6  PROT 5.9* 5.6*  ALBUMIN 3.4* 3.4*   No results for input(s): "LIPASE", "AMYLASE" in the last 168 hours. No results for input(s): "AMMONIA" in the last 168 hours. Coagulation Profile: Recent Labs  Lab 01/07/23 1815  INR 1.1   Cardiac Enzymes: No results for input(s): "CKTOTAL", "CKMB", "CKMBINDEX", "TROPONINI" in the last 168 hours. BNP (last 3 results) No results for input(s): "PROBNP" in the last 8760 hours. HbA1C: No results for input(s): "HGBA1C" in the last 72 hours. CBG: No results for input(s): "GLUCAP" in the last 168 hours. Lipid  Profile: No results for input(s): "CHOL", "HDL", "LDLCALC", "TRIG", "CHOLHDL", "LDLDIRECT" in the last 72 hours. Thyroid Function Tests: No results for input(s): "TSH", "T4TOTAL", "FREET4", "T3FREE", "THYROIDAB" in the last 72 hours. Anemia Panel: No results for input(s): "VITAMINB12", "FOLATE", "FERRITIN", "TIBC", "IRON", "RETICCTPCT" in the last 72 hours. Urine analysis:    Component Value Date/Time   COLORURINE YELLOW 10/02/2022 0824   APPEARANCEUR HAZY (A) 10/02/2022 0824   LABSPEC 1.028 10/02/2022 0824   PHURINE 7.0 10/02/2022 0824   GLUCOSEU >=500 (A) 10/02/2022 0824   HGBUR SMALL (A) 10/02/2022 0824   BILIRUBINUR NEGATIVE 10/02/2022 0824   KETONESUR 20 (A) 10/02/2022 0824   PROTEINUR NEGATIVE 10/02/2022 0824   UROBILINOGEN 1.0 07/13/2013 0342   NITRITE NEGATIVE 10/02/2022 0824   LEUKOCYTESUR NEGATIVE 10/02/2022 0824    Radiological Exams on Admission: US Venous Img Lower Unilateral Left  Result Date: 01/07/2023 CLINICAL DATA:  leg swelling, access for blood clot EXAM: LEFT LOWER EXTREMITY VENOUS DOPPLER ULTRASOUND TECHNIQUE: Gray-scale sonography with compression, as well as color and duplex ultrasound, were performed to evaluate the deep venous system(s) from the level of the common femoral vein through the popliteal and proximal calf veins. COMPARISON:  None Available. FINDINGS: VENOUS Normal compressibility of the common femoral, superficial femoral, and popliteal veins, as well as the visualized calf veins. Visualized portions of profunda femoral vein and great saphenous vein unremarkable. No  filling defects to suggest DVT on grayscale or color Doppler imaging. Doppler waveforms show normal direction of venous flow, normal respiratory plasticity and response to augmentation. Limited views of the contralateral common femoral vein are unremarkable. OTHER Incidentally noted left inguinal lymph node measuring 2.5 by 1.1 cm. IMPRESSION: No evidence of DVT in the left lower extremity.  Electronically Signed   By: Margaretha Sheffield M.D.   On: 01/07/2023 19:02   DG Chest Port 1 View  Result Date: 01/07/2023 CLINICAL DATA:  Questionable sepsis EXAM: PORTABLE CHEST 1 VIEW COMPARISON:  Chest x-ray dated October 02, 2022 FINDINGS: Cardiac and mediastinal contours are unchanged. Left chest wall port is unchanged in position. Lungs are clear. No pleural effusion or pneumothorax. IMPRESSION: No active disease. Electronically Signed   By: Yetta Glassman M.D.   On: 01/07/2023 18:41      Assessment/Plan    Principal Problem:   Left leg cellulitis  ***      ***          ***           ***          ***          ***          ***          ***          ***          ***          ***          ***          ***          ***     DVT prophylaxis: SCD's ***  Code Status: Full code*** Family Communication: none*** Disposition Plan: Per Rounding Team Consults called: none***;  Admission status: ***    I SPENT GREATER THAN 75 *** MINUTES IN CLINICAL CARE TIME/MEDICAL DECISION-MAKING IN COMPLETING THIS ADMISSION.     [place] DO [facility]   01/07/2023, 11:56 PM   ***

## 2023-01-07 NOTE — Telephone Encounter (Signed)
Mrs. Fowlks called in to report the patient is having redness on both legs, around the ankle and on the top of the feet.. The left leg is worst then the right leg, very painful to touch and swollen. I advise the patient to go the emergency to rule out a DVT. Patient gave verbal understanding and had no further questions at this time.

## 2023-01-07 NOTE — ED Provider Notes (Addendum)
[facility] Provider Note   CSN: [id number] Arrival date & time: 01/07/23  1641     History  Chief Complaint  Patient presents with   Leg Swelling    HERSHAL MACHON is a 77 y.o. male.  Patient is a 77 year old male who is currently undergoing chemotherapy for pancreatic cancer.  He noted that he had some increased redness in his left leg and in his right foot but more on his left leg since yesterday.  He also noted that he developed a fever up to 101.9.  He is concerned that he has an infection.  His last chemo was Friday.  He denies any cough or cold symptoms.  No increased abdominal pain.  No vomiting or diarrhea.  No recent injury to the leg.  Has had some chronic swelling in his lower extremities since January and was started on Lasix.  He does not really report any increased swelling but the redness is new.       Home Medications Prior to Admission medications   Medication Sig Start Date End Date Taking? Authorizing Provider  acetaminophen (TYLENOL) 325 MG tablet Take 325 mg by mouth daily as needed for headache. Patient not taking: Reported on 12/06/2022    [provider]  Ascorbic Acid (VITAMIN C) 500 MG tablet Take 500 mg by mouth daily.   Patient not taking: Reported on 12/06/2022    [provider]  brimonidine (ALPHAGAN) 0.15 % ophthalmic solution Place 1 drop into the left eye in the morning and at bedtime.    [provider]  Calcium Carbonate Antacid (TUMS ULTRA 1000 PO) Take 2,000-3,000 mg by mouth See admin instructions. 3000 mg by mouth in the morning and 2000 mg by mouth at night    [provider]  Carboxymethylcell-Glycerin PF (REFRESH OPTIVE PF) 0.5-0.9 % SOLN Place 1 drop into both eyes as needed (dry eyes). 04/28/18   [provider]  cholecalciferol (VITAMIN D3) 25 MCG (1000 UNIT) tablet Take 1,000 Units by mouth daily.    [provider]  Docusate Sodium (COLACE PO)  Take 1 tablet by mouth in the morning, at noon, and at bedtime. 11/15/22   [provider]  Dorzolamide HCl-Timolol Mal PF 2-0.5 % SOLN Place 1 drop into the left eye 2 (two) times daily. 12/01/21   [provider]  fentaNYL (DURAGESIC) 50 MCG/HR Place 1 patch onto the skin every 3 (three) days. 12/27/22 01/27/23  Owens Shark, NP  furosemide (LASIX) 20 MG tablet Take 20 mg by mouth daily. X 7 days 12/24/22 12/24/23  [provider]  Garlic 123XX123 MG CAPS Take 1,000 mg by mouth daily. Patient not taking: Reported on 12/06/2022    [provider]  GEMTESA 75 MG TABS Take 75 mg by mouth daily. 01/31/22   [provider]  HYDROmorphone (DILAUDID) 4 MG tablet Take 1-2 tablets (4-8 mg total) by mouth every 4 (four) hours as needed for severe pain. 01/03/23   Owens Shark, NP  lidocaine-prilocaine (EMLA) cream Apply 1 Application topically as needed. 12/11/22   Ladell Pier, MD  nystatin (MYCOSTATIN) 100000 UNIT/ML suspension Take 5 mLs (500,000 Units total) by mouth 4 (four) times daily for 7 days. 01/04/23 01/12/23  Owens Shark, NP  olopatadine (PATANOL) 0.1 % ophthalmic solution Place 1 drop into both eyes as needed for allergies.    [provider]  ondansetron (ZOFRAN) 8 MG tablet Take 1 tablet (8 mg total)  by mouth every 8 (eight) hours as needed for nausea or vomiting (Starting day 3 after chemo as needed for nausea(received Aloxi with chemo)). 09/10/22   Ladell Pier, MD  pantoprazole (PROTONIX) 40 MG tablet Take 1 tablet (40 mg total) by mouth 2 (two) times daily. 12/06/22   Owens Shark, NP  Polyethylene Glycol 3350 (MIRALAX PO) Take 17 g by mouth daily at 6 (six) AM. Patient not taking: Reported on 12/27/2022    [provider]  potassium chloride SA (KLOR-CON M) 20 MEQ tablet Take 1 tablet (20 mEq total) by mouth daily. 12/06/22   Owens Shark, NP  prochlorperazine (COMPAZINE) 10 MG tablet Take 1 tablet (10 mg total) by mouth every 6  (six) hours as needed for nausea or vomiting. 10/07/22   Eugenie Filler, MD  sodium chloride (AYR) 0.65 % nasal spray Place 2-3 sprays into the nose as needed for congestion. Patient not taking: Reported on 12/27/2022    [provider]  tiZANidine (ZANAFLEX) 4 MG tablet Take 1 tablet (4 mg total) by mouth every 8 (eight) hours as needed for muscle spasms. 11/30/22   Stark Klein, MD  Triptorelin Pamoate 11.25 MG SUSR Inject 11.25 mg into the muscle as needed (PSA). Patient not taking: Reported on 12/27/2022    [provider]      Allergies    Claritin-d 24 hour [loratadine-pseudoephedrine er] and Lupron [leuprolide]    Review of Systems   Review of Systems  Constitutional:  Positive for fatigue and fever. Negative for chills and diaphoresis.  HENT:  Negative for congestion, rhinorrhea and sneezing.   Eyes: Negative.   Respiratory:  Negative for cough, chest tightness and shortness of breath.   Cardiovascular:  Positive for leg swelling. Negative for chest pain.  Gastrointestinal:  Positive for abdominal pain (Chronic and unchanged from baseline). Negative for blood in stool, diarrhea, nausea and vomiting.  Genitourinary:  Negative for difficulty urinating, flank pain, frequency and hematuria.  Musculoskeletal:  Negative for arthralgias and back pain.  Skin:  Negative for rash.  Neurological:  Negative for dizziness, speech difficulty, weakness, numbness and headaches.    Physical Exam Updated Vital Signs BP 110/74   Pulse 75   Temp 98.8 F (37.1 C) (Oral)   Resp 16   Ht 6' (1.829 m)   Wt 95.3 kg   SpO2 99%   BMI 28.48 kg/m  Physical Exam Constitutional:      Appearance: He is well-developed.  HENT:     Head: Normocephalic and atraumatic.  Eyes:     Pupils: Pupils are equal, round, and reactive to light.  Cardiovascular:     Rate and Rhythm: Normal rate and regular rhythm.     Heart sounds: Normal heart sounds.  Pulmonary:     Effort: Pulmonary  effort is normal. No respiratory distress.     Breath sounds: Normal breath sounds. No wheezing or rales.  Chest:     Chest wall: No tenderness.  Abdominal:     General: Bowel sounds are normal.     Palpations: Abdomen is soft.     Tenderness: There is no abdominal tenderness. There is no guarding or rebound.  Musculoskeletal:        General: Normal range of motion.     Cervical back: Normal range of motion and neck supple.     Comments: 2+ edema to lower extremities bilaterally.  He has some mild erythema to the right foot.  He has some erythema to  the left foot and leg extending up to the posterior thigh.  No open wounds are noted.  Pedal pulses are intact.  Lymphadenopathy:     Cervical: No cervical adenopathy.  Skin:    General: Skin is warm and dry.     Findings: No rash.  Neurological:     Mental Status: He is alert and oriented to person, place, and time.     ED Results / Procedures / Treatments   Labs (all labs ordered are listed, but only abnormal results are displayed) Labs Reviewed  COMPREHENSIVE METABOLIC PANEL - Abnormal; Notable for the following components:      Result Value   Sodium 133 (*)    Glucose, Bld 151 (*)    Creatinine, Ser 0.49 (*)    Calcium 8.7 (*)    Total Protein 5.6 (*)    Albumin 3.4 (*)    All other components within normal limits  CBC WITH DIFFERENTIAL/PLATELET - Abnormal; Notable for the following components:   RBC 3.71 (*)    Hemoglobin 10.5 (*)    HCT 31.7 (*)    Platelets 123 (*)    Lymphs Abs 0.2 (*)    All other components within normal limits  RESP PANEL BY RT-PCR (RSV, FLU A&B, COVID)  RVPGX2  CULTURE, BLOOD (ROUTINE X 2)  CULTURE, BLOOD (ROUTINE X 2)  LACTIC ACID, PLASMA  PROTIME-INR  APTT  LACTIC ACID, PLASMA  URINALYSIS, W/ REFLEX TO CULTURE (INFECTION SUSPECTED)    EKG None  Radiology US Venous Img Lower Unilateral Left  Result Date: 01/07/2023 CLINICAL DATA:  leg swelling, access for blood clot EXAM: LEFT LOWER  EXTREMITY VENOUS DOPPLER ULTRASOUND TECHNIQUE: Gray-scale sonography with compression, as well as color and duplex ultrasound, were performed to evaluate the deep venous system(s) from the level of the common femoral vein through the popliteal and proximal calf veins. COMPARISON:  None Available. FINDINGS: VENOUS Normal compressibility of the common femoral, superficial femoral, and popliteal veins, as well as the visualized calf veins. Visualized portions of profunda femoral vein and great saphenous vein unremarkable. No filling defects to suggest DVT on grayscale or color Doppler imaging. Doppler waveforms show normal direction of venous flow, normal respiratory plasticity and response to augmentation. Limited views of the contralateral common femoral vein are unremarkable. OTHER Incidentally noted left inguinal lymph node measuring 2.5 by 1.1 cm. IMPRESSION: No evidence of DVT in the left lower extremity. Electronically Signed   By: Margaretha Sheffield M.D.   On: 01/07/2023 19:02   DG Chest Port 1 View  Result Date: 01/07/2023 CLINICAL DATA:  Questionable sepsis EXAM: PORTABLE CHEST 1 VIEW COMPARISON:  Chest x-ray dated October 02, 2022 FINDINGS: Cardiac and mediastinal contours are unchanged. Left chest wall port is unchanged in position. Lungs are clear. No pleural effusion or pneumothorax. IMPRESSION: No active disease. Electronically Signed   By: Yetta Glassman M.D.   On: 01/07/2023 18:41    Procedures Procedures    Medications Ordered in ED Medications  cefTRIAXone (ROCEPHIN) 1 g in sodium chloride 0.9 % 100 mL IVPB (1 g Intravenous New Bag/Given 01/07/23 2002)    ED Course/ Medical Decision Making/ A&P                             Medical Decision Making Amount and/or Complexity of Data Reviewed Labs: ordered. Radiology: ordered. ECG/medicine tests: ordered.  Risk Decision regarding hospitalization.   Patient is a 77 year old male who presents with  fever and chills.  He has had  some redness that started yesterday and progressed today to his left leg.  There is little redness in his right foot but primarily in his left leg.  He has chronic lower extremity swelling.  His vital signs are stable.  His lactate is normal.  His labs show mild elevation in his glucose.  His hemoglobin is slightly lower than baseline.  Do not see any other source of infection.  Chest x-ray does not show evidence of pneumonia.  Respiratory panel is negative.  He was started on IV antibiotics.  I spoke with Dr. Myna Hidalgo who will admit the patient for further treatment.  Pt takes oral dilaudid to control his chronic abd pain.  Took one tablet ('4mg'$ ) of his home dilaudid.  Final Clinical Impression(s) / ED Diagnoses Final diagnoses:  Cellulitis of left lower extremity    Rx / DC Orders ED Discharge Orders     None         Malvin Johns, MD 01/07/23 2015    Malvin Johns, MD 01/07/23 2246

## 2023-01-08 ENCOUNTER — Encounter (HOSPITAL_COMMUNITY): Payer: Self-pay | Admitting: Family Medicine

## 2023-01-08 DIAGNOSIS — D638 Anemia in other chronic diseases classified elsewhere: Secondary | ICD-10-CM | POA: Diagnosis not present

## 2023-01-08 DIAGNOSIS — L03116 Cellulitis of left lower limb: Secondary | ICD-10-CM | POA: Diagnosis not present

## 2023-01-08 DIAGNOSIS — K219 Gastro-esophageal reflux disease without esophagitis: Secondary | ICD-10-CM | POA: Diagnosis not present

## 2023-01-08 DIAGNOSIS — C252 Malignant neoplasm of tail of pancreas: Secondary | ICD-10-CM | POA: Diagnosis not present

## 2023-01-08 LAB — CBC WITH DIFFERENTIAL/PLATELET
Abs Immature Granulocytes: 0.02 10*3/uL (ref 0.00–0.07)
Basophils Absolute: 0 10*3/uL (ref 0.0–0.1)
Basophils Relative: 1 %
Eosinophils Absolute: 0.4 10*3/uL (ref 0.0–0.5)
Eosinophils Relative: 7 %
HCT: 30.4 % — ABNORMAL LOW (ref 39.0–52.0)
Hemoglobin: 10 g/dL — ABNORMAL LOW (ref 13.0–17.0)
Immature Granulocytes: 0 %
Lymphocytes Relative: 4 %
Lymphs Abs: 0.2 10*3/uL — ABNORMAL LOW (ref 0.7–4.0)
MCH: 28.6 pg (ref 26.0–34.0)
MCHC: 32.9 g/dL (ref 30.0–36.0)
MCV: 86.9 fL (ref 80.0–100.0)
Monocytes Absolute: 0.1 10*3/uL (ref 0.1–1.0)
Monocytes Relative: 2 %
Neutro Abs: 5.6 10*3/uL (ref 1.7–7.7)
Neutrophils Relative %: 86 %
Platelets: 123 10*3/uL — ABNORMAL LOW (ref 150–400)
RBC: 3.5 MIL/uL — ABNORMAL LOW (ref 4.22–5.81)
RDW: 14.6 % (ref 11.5–15.5)
WBC: 6.4 10*3/uL (ref 4.0–10.5)
nRBC: 0 % (ref 0.0–0.2)

## 2023-01-08 LAB — COMPREHENSIVE METABOLIC PANEL
ALT: 12 U/L (ref 0–44)
AST: 22 U/L (ref 15–41)
Albumin: 2.8 g/dL — ABNORMAL LOW (ref 3.5–5.0)
Alkaline Phosphatase: 52 U/L (ref 38–126)
Anion gap: 7 (ref 5–15)
BUN: 7 mg/dL — ABNORMAL LOW (ref 8–23)
CO2: 26 mmol/L (ref 22–32)
Calcium: 8 mg/dL — ABNORMAL LOW (ref 8.9–10.3)
Chloride: 99 mmol/L (ref 98–111)
Creatinine, Ser: 0.54 mg/dL — ABNORMAL LOW (ref 0.61–1.24)
GFR, Estimated: >60 mL/min (ref >60)
Glucose, Bld: 134 mg/dL — ABNORMAL HIGH (ref 70–99)
Potassium: 3.9 mmol/L (ref 3.5–5.1)
Sodium: 132 mmol/L — ABNORMAL LOW (ref 135–145)
Total Bilirubin: 0.7 mg/dL (ref 0.3–1.2)
Total Protein: 5.1 g/dL — ABNORMAL LOW (ref 6.5–8.1)

## 2023-01-08 LAB — MAGNESIUM: Magnesium: 1.8 mg/dL (ref 1.7–2.4)

## 2023-01-08 MED ORDER — OLOPATADINE HCL 0.1 % OP SOLN: 1.0000 [drp] | OPHTHALMIC | Status: DC | PRN

## 2023-01-08 MED ORDER — NYSTATIN 100000 UNIT/ML MT SUSP
5.0000 mL | Freq: Four times a day (QID) | OROMUCOSAL | Status: DC
  Administered 2023-01-08 – 2023-01-13 (×12): 500000 [IU] via ORAL
  Filled 2023-01-08 (×12): qty 5

## 2023-01-08 MED ORDER — PANTOPRAZOLE SODIUM 40 MG PO TBEC
40.0000 mg | DELAYED_RELEASE_TABLET | Freq: Two times a day (BID) | ORAL | Status: DC
  Administered 2023-01-08 – 2023-01-13 (×11): 40 mg via ORAL
  Filled 2023-01-08 (×11): qty 1

## 2023-01-08 MED ORDER — HYDROMORPHONE HCL 1 MG/ML IJ SOLN
0.5000 mg | INTRAMUSCULAR | Status: DC | PRN
  Administered 2023-01-08 (×4): 0.5 mg via INTRAVENOUS
  Filled 2023-01-08 (×4): qty 0.5

## 2023-01-08 MED ORDER — HYDROMORPHONE HCL 2 MG PO TABS
4.0000 mg | ORAL_TABLET | ORAL | Status: DC | PRN
  Administered 2023-01-08 – 2023-01-11 (×16): 4 mg via ORAL
  Filled 2023-01-08 (×16): qty 2

## 2023-01-08 MED ORDER — TIMOLOL MALEATE 0.5 % OP SOLN: 1.0000 [drp] | Freq: Two times a day (BID) | OPHTHALMIC | Status: DC

## 2023-01-08 MED ORDER — BRIMONIDINE TARTRATE 0.15 % OP SOLN
1.0000 [drp] | Freq: Two times a day (BID) | OPHTHALMIC | Status: DC
  Administered 2023-01-08 – 2023-01-13 (×10): 1 [drp] via OPHTHALMIC
  Filled 2023-01-08: qty 5

## 2023-01-08 MED ORDER — DORZOLAMIDE HCL-TIMOLOL MAL 2-0.5 % OP SOLN
1.0000 [drp] | Freq: Two times a day (BID) | OPHTHALMIC | Status: DC
  Administered 2023-01-08: 1 [drp] via OPHTHALMIC
  Filled 2023-01-08: qty 10

## 2023-01-08 MED ORDER — FENTANYL 50 MCG/HR TD PT72
1.0000 | MEDICATED_PATCH | TRANSDERMAL | Status: DC
  Administered 2023-01-08 – 2023-01-11 (×2): 1 via TRANSDERMAL
  Filled 2023-01-08 (×4): qty 1

## 2023-01-08 MED ORDER — NALOXONE HCL 0.4 MG/ML IJ SOLN: 0.4000 mg | INTRAMUSCULAR | Status: DC | PRN

## 2023-01-08 MED ORDER — MAGNESIUM SULFATE 2 GM/50ML IV SOLN
2.0000 g | Freq: Once | INTRAVENOUS | Status: AC
  Administered 2023-01-08: 2 g via INTRAVENOUS
  Filled 2023-01-08: qty 50

## 2023-01-08 MED ORDER — DORZOLAMIDE HCL-TIMOLOL MAL PF 2-0.5 % OP SOLN: 1.0000 [drp] | Freq: Two times a day (BID) | OPHTHALMIC | Status: DC

## 2023-01-08 MED ORDER — CHLORHEXIDINE GLUCONATE CLOTH 2 % EX PADS
6.0000 | MEDICATED_PAD | Freq: Every day | CUTANEOUS | Status: DC
  Administered 2023-01-08 – 2023-01-13 (×6): 6 via TOPICAL

## 2023-01-08 NOTE — Progress Notes (Signed)
PROGRESS NOTE    Jared [name]  [id number] DOB: 04-Aug-1946 DOA: 01/07/2023 PCP: [name], MD   Brief Narrative:  Patient is a 77 year old elderly African-American male with a past medical history significant for but not limited to pancreatic cancer on chemotherapy, GERD, anemia of chronic disease with a baseline hemoglobin of 10-12 as well as other comorbidities who presents with worsening left lower extremity erythema and warmth.  He has had 2 to 3 days of progressive left lower extremity erythema, associated tenderness, increased warmth and swelling and no drainage noted.  He has had subjective fevers over that timeframe but denies any chills, full body rigors or generalized myalgias.  Denies any trauma and any associated focal numbness or paresthesias.  Recently saw oncology for chemotherapy.  In the ED his Tmax was 100.1.  Labs were notable for hyponatremia and blood cultures x 2 were obtained.  Further workup reveals left lower extremity cellulitis which is improving with IV antibiotics.   Assessment and Plan: No notes have been filed under this hospital service. Service: Hospitalist  Right Lower Extremity Cellulitis -Has had a 2 to 3-day worsening and progressive left lower extremity erythema associated tenderness, increased warmth to touch as well as swelling Left ultrasound done showed no evidence of DVT -Denies any purulent discharge an MRSA is less likely -Sepsis criteria not met given nearly met SIRS criteria -Follow blood cultures x 2 and repeat CBC in the a.m. -Continue with IV antibiotics with ceftriaxone and de-escalate if not improving -Continue pain control as below  Hyponatremia Recent Labs  Lab 12/12/22 1307 12/27/22 0922 01/03/23 1310 01/07/23 1815 01/08/23 0644  NA 138 139 138 133* 132*  -Continue to Monitor and Trend and Repeat CMP in the AM  Pancreatic Cancer -Has been on chemotherapy and follows with [name], NP as well as Dr.  [name] -Resume oral Dilaudid home dosing and allow for IV Dilaudid 0.5 mg as breakthrough -Continue with fentanyl patch -I have notified Dr. [name] as a courtesy via EPIC  Oral thrush -Continue with nystatin 500,000 units p.o. 4 times daily  Anemia of Chronic Disease/Normocytic Anemia -Hgb/Hct Trend: Recent Labs  Lab 12/12/22 1307 12/27/22 0922 01/03/23 1310 01/07/23 1815 01/08/23 0644  HGB 12.2* 11.4* 11.4* 10.5* 10.0*  HCT 37.4* 34.7* 34.9* 31.7* 30.4*  MCV 88.6 87.4 86.6 85.4 86.9  -Check Anemia Panel in the AM  -Continue to monitor for signs and symptoms of bleeding; no overt bleeding noted -Repeat CBC in a.m.  Glaucoma -Continue with brimonidine 0.15% atomic solution 1 drop in left eye twice daily as well as dorzolamide-timolol 1 drop in both eyes twice daily  Thrombocytopenia -Patient's platelet count has dropped and trended down and is 123 today -Platelet count trend: Recent Labs  Lab 12/12/22 1307 12/27/22 0922 01/03/23 1310 01/07/23 1815 01/08/23 0644  PLT 246 197 279 123* 123*  -Continue to monitor for signs or symptoms of bleeding; no overt bleeding noted -Repeat CBC in the AM  Hypoalbuminemia -Patient's Albumin Trend: Recent Labs  Lab 12/12/22 1307 12/27/22 0922 01/03/23 1310 01/07/23 1815 01/08/23 0644  ALBUMIN 4.0 3.5 3.4* 3.4* 2.8*  -Continue to Monitor and Trend and repeat CMP in the AM  GERD/GI Prophylaxis -C/w PPI with Pantoprazole 40 mg po BID  Overweight -Complicates overall prognosis and care -Estimated body mass index is 28.48 kg/m as calculated from the following:   Height as of this encounter: 6' (1.829 m).   Weight as of this encounter: 95.3 kg.  -Weight Loss and  Dietary Counseling given  DVT prophylaxis: enoxaparin (LOVENOX) injection 40 mg Start: 01/08/23 0800    Code Status: Full Code Family Communication: No family currently at bedside  Disposition Plan:  Level of care: Med-Surg Status is: Inpatient Remains  inpatient appropriate because: Needs further clinical improvement prior to safe discharge disposition   Consultants:  Have notified his primary oncologist as a courtesy  Procedures:  As delineated as above and had a lower extremity duplex that was negative for DVT but did show incidentally noted left inguinal lymph node measuring 2.5 by 1.1 cm   Antimicrobials:  Anti-infectives (From admission, onward)    Start     Dose/Rate Route Frequency Ordered Stop   01/08/23 2000  cefTRIAXone (ROCEPHIN) 1 g in sodium chloride 0.9 % 100 mL IVPB        1 g 200 mL/hr over 30 Minutes Intravenous Every 24 hours 01/07/23 2358     01/07/23 1945  cefTRIAXone (ROCEPHIN) 1 g in sodium chloride 0.9 % 100 mL IVPB        1 g 200 mL/hr over 30 Minutes Intravenous  Once 01/07/23 1934 01/07/23 2035       Subjective: Seen and examined at bedside and thinks his leg is not as swollen and erythematous.  Thinks that he is actually improving on antibiotics.  Denies any chest pain or shortness of breath.  No other concerns or complaints at this time.  Objective: Vitals:   01/08/23 0552 01/08/23 1135 01/08/23 1317 01/08/23 1819  BP: (!) 90/57 108/64 98/67 112/76  Pulse: 73 72 71 76  Resp: '16 18 18   '$ Temp: 98.6 F (37 C) 98.2 F (36.8 C) 98.8 F (37.1 C)   TempSrc: Oral Oral Oral   SpO2: 100% 100% 99%   Weight:      Height:        Intake/Output Summary (Last 24 hours) at 01/08/2023 1933 Last data filed at 01/08/2023 1600 Gross per 24 hour  Intake 574.95 ml  Output --  Net 574.95 ml   Filed Weights   01/07/23 1649  Weight: 95.3 kg   Examination: Physical Exam:  Constitutional: WN/WD chronically ill-appearing elderly AA male in no acute distress Respiratory: Diminished to auscultation bilaterally with coarse breath sounds, no wheezing, rales, rhonchi or crackles. Normal respiratory effort and patient is not tachypenic. No accessory muscle use.  Unlabored breathing Cardiovascular: RRR, no murmurs /  rubs / gallops. S1 and S2 auscultated.  Has 1+ lower extremity left leg extremity edema and erythema Abdomen: Soft, non-tender, distended secondary to body habitus. Bowel sounds positive.  GU: Deferred. Musculoskeletal: No clubbing / cyanosis of digits/nails. No joint deformity upper and lower extremities.  Skin: No rashes, lesions, ulcers limited skin evaluation. No induration; Warm and dry.  Neurologic: CN 2-12 grossly intact with no focal deficits.  Romberg sign and cerebellar reflexes not assessed.  Psychiatric: Normal judgment and insight. Alert and oriented x 3. Normal mood and appropriate affect.   Data Reviewed: I have personally reviewed following labs and imaging studies  CBC: Recent Labs  Lab 01/03/23 1310 01/07/23 1815 01/08/23 0644  WBC 4.2 6.7 6.4  NEUTROABS 2.4 6.1 5.6  HGB 11.4* 10.5* 10.0*  HCT 34.9* 31.7* 30.4*  MCV 86.6 85.4 86.9  PLT 279 123* [id number]*   Basic Metabolic Panel: Recent Labs  Lab 01/03/23 1310 01/07/23 1815 01/08/23 0644  NA 138 133* 132*  K 3.7 4.1 3.9  CL 103 99 99  CO2 '27 28 26  '$ GLUCOSE 149* 151*  134*  BUN 8 8 7*  CREATININE 0.64 0.49* 0.54*  CALCIUM 9.4 8.7* 8.0*  MG  --   --  1.8   GFR: Estimated Creatinine Clearance: 92.6 mL/min (A) (by C-G formula based on SCr of 0.54 mg/dL (L)). Liver Function Tests: Recent Labs  Lab 01/03/23 1310 01/07/23 1815 01/08/23 0644  AST 14* 20 22  ALT '8 9 12  '$ ALKPHOS 60 54 52  BILITOT 0.3 0.6 0.7  PROT 5.9* 5.6* 5.1*  ALBUMIN 3.4* 3.4* 2.8*   No results for input(s): "LIPASE", "AMYLASE" in the last 168 hours. No results for input(s): "AMMONIA" in the last 168 hours. Coagulation Profile: Recent Labs  Lab 01/07/23 1815  INR 1.1   Cardiac Enzymes: No results for input(s): "CKTOTAL", "CKMB", "CKMBINDEX", "TROPONINI" in the last 168 hours. BNP (last 3 results) No results for input(s): "PROBNP" in the last 8760 hours. HbA1C: No results for input(s): "HGBA1C" in the last 72 hours. CBG: No  results for input(s): "GLUCAP" in the last 168 hours. Lipid Profile: No results for input(s): "CHOL", "HDL", "LDLCALC", "TRIG", "CHOLHDL", "LDLDIRECT" in the last 72 hours. Thyroid Function Tests: No results for input(s): "TSH", "T4TOTAL", "FREET4", "T3FREE", "THYROIDAB" in the last 72 hours. Anemia Panel: No results for input(s): "VITAMINB12", "FOLATE", "FERRITIN", "TIBC", "IRON", "RETICCTPCT" in the last 72 hours. Sepsis Labs: Recent Labs  Lab 01/07/23 1721  LATICACIDVEN 1.3   Recent Results (from the past 240 hour(s))  Blood Culture (routine x 2)     Status: None (Preliminary result)   Collection Time: 01/07/23  5:50 PM   Specimen: BLOOD  Result Value Ref Range Status   Specimen Description   Final    BLOOD RIGHT ANTECUBITAL Performed at [facility], [address] [place], [place] [address]    Special Requests   Final    Blood Culture adequate volume BOTTLES DRAWN AEROBIC AND ANAEROBIC Performed at [facility], [address], [place], [place] [address]    Culture   Final    NO GROWTH < 12 HOURS Performed at [facility], [place] [address] [place], [place] [address]    Report Status PENDING  Incomplete  Resp panel by RT-PCR (RSV, Flu A&B, Covid) Anterior Nasal Swab     Status: None   Collection Time: 01/07/23  6:00 PM   Specimen: Anterior Nasal Swab  Result Value Ref Range Status   SARS Coronavirus 2 by RT PCR NEGATIVE NEGATIVE Final    Comment: (NOTE) SARS-CoV-2 target nucleic acids are NOT DETECTED.  The SARS-CoV-2 RNA is generally detectable in upper respiratory specimens during the acute phase of infection. The lowest concentration of SARS-CoV-2 viral copies this assay can detect is 138 copies/mL. A negative result does not preclude SARS-Cov-2 infection and should not be used as the sole basis for treatment or other patient management decisions. A negative result may occur with  improper specimen collection/handling,  submission of specimen other than nasopharyngeal swab, presence of viral mutation(s) within the areas targeted by this assay, and inadequate number of viral copies(<138 copies/mL). A negative result must be combined with clinical observations, patient history, and epidemiological information. The expected result is Negative.  Fact Sheet for Patients:  EntrepreneurPulse.com.au  Fact Sheet for Healthcare Providers:  IncredibleEmployment.be  This test is no t yet approved or cleared by the [place] FDA and  has been authorized for detection and/or diagnosis of SARS-CoV-2 by FDA under an Emergency Use Authorization (EUA). This EUA will remain  in effect (meaning this  test can be used) for the duration of the COVID-19 declaration under Section 564(b)(1) of the Act, 21 U.S.C.section 360bbb-3(b)(1), unless the authorization is terminated  or revoked sooner.       Influenza A by PCR NEGATIVE NEGATIVE Final   Influenza B by PCR NEGATIVE NEGATIVE Final    Comment: (NOTE) The Xpert Xpress SARS-CoV-2/FLU/RSV plus assay is intended as an aid in the diagnosis of influenza from Nasopharyngeal swab specimens and should not be used as a sole basis for treatment. Nasal washings and aspirates are unacceptable for Xpert Xpress SARS-CoV-2/FLU/RSV testing.  Fact Sheet for Patients: EntrepreneurPulse.com.au  Fact Sheet for Healthcare Providers: IncredibleEmployment.be  This test is not yet approved or cleared by the [place] FDA and has been authorized for detection and/or diagnosis of SARS-CoV-2 by FDA under an Emergency Use Authorization (EUA). This EUA will remain in effect (meaning this test can be used) for the duration of the COVID-19 declaration under Section 564(b)(1) of the Act, 21 U.S.C. section 360bbb-3(b)(1), unless the authorization is terminated or revoked.     Resp Syncytial Virus by PCR NEGATIVE  NEGATIVE Final    Comment: (NOTE) Fact Sheet for Patients: EntrepreneurPulse.com.au  Fact Sheet for Healthcare Providers: IncredibleEmployment.be  This test is not yet approved or cleared by the [place] FDA and has been authorized for detection and/or diagnosis of SARS-CoV-2 by FDA under an Emergency Use Authorization (EUA). This EUA will remain in effect (meaning this test can be used) for the duration of the COVID-19 declaration under Section 564(b)(1) of the Act, 21 U.S.C. section 360bbb-3(b)(1), unless the authorization is terminated or revoked.  Performed at [facility], [address] [place], [place] [address]   Blood Culture (routine x 2)     Status: None (Preliminary result)   Collection Time: 01/07/23  6:05 PM   Specimen: BLOOD  Result Value Ref Range Status   Specimen Description   Final    BLOOD LEFT ANTECUBITAL Performed at [facility], [address] [place], [place] [address]    Special Requests   Final    Blood Culture adequate volume BOTTLES DRAWN AEROBIC AND ANAEROBIC Performed at [facility], [address], [place], [place] [address]    Culture   Final    NO GROWTH < 12 HOURS Performed at [facility], [place] [address], [place], [place] [address]    Report Status PENDING  Incomplete    Radiology Studies: US Venous Img Lower Unilateral Left  Result Date: 01/07/2023 CLINICAL DATA:  leg swelling, access for blood clot EXAM: LEFT LOWER EXTREMITY VENOUS DOPPLER ULTRASOUND TECHNIQUE: Gray-scale sonography with compression, as well as color and duplex ultrasound, were performed to evaluate the deep venous system(s) from the level of the common femoral vein through the popliteal and proximal calf veins. COMPARISON:  None Available. FINDINGS: VENOUS Normal compressibility of the common femoral, superficial femoral, and popliteal veins, as well as the  visualized calf veins. Visualized portions of profunda femoral vein and great saphenous vein unremarkable. No filling defects to suggest DVT on grayscale or color Doppler imaging. Doppler waveforms show normal direction of venous flow, normal respiratory plasticity and response to augmentation. Limited views of the contralateral common femoral vein are unremarkable. OTHER Incidentally noted left inguinal lymph node measuring 2.5 by 1.1 cm. IMPRESSION: No evidence of DVT in the left lower extremity. Electronically Signed   By: Margaretha Sheffield M.D.   On: 01/07/2023 19:02   DG Chest [facility] 1 View  Result  Date: 01/07/2023 CLINICAL DATA:  Questionable sepsis EXAM: PORTABLE CHEST 1 VIEW COMPARISON:  Chest x-ray dated October 02, 2022 FINDINGS: Cardiac and mediastinal contours are unchanged. Left chest wall port is unchanged in position. Lungs are clear. No pleural effusion or pneumothorax. IMPRESSION: No active disease. Electronically Signed   By: Yetta Glassman M.D.   On: 01/07/2023 18:41    Scheduled Meds:  brimonidine  1 drop Left Eye BID   Chlorhexidine Gluconate Cloth  6 each Topical Daily   dorzolamide-timolol  1 drop Both Eyes BID   enoxaparin (LOVENOX) injection  40 mg Subcutaneous Q24H   fentaNYL  1 patch Transdermal Q72H   nystatin  5 mL Oral QID   pantoprazole  40 mg Oral BID   Continuous Infusions:  cefTRIAXone (ROCEPHIN)  IV      LOS: 1 day   Raiford Noble, DO [facility] Available via Epic secure chat 7am-7pm After these hours, please refer to coverage provider listed on [email or web address] 01/08/2023, 7:33 PM

## 2023-01-09 DIAGNOSIS — L03116 Cellulitis of left lower limb: Secondary | ICD-10-CM | POA: Diagnosis not present

## 2023-01-09 LAB — CBC WITH DIFFERENTIAL/PLATELET
Abs Immature Granulocytes: 0.05 10*3/uL (ref 0.00–0.07)
Basophils Absolute: 0 10*3/uL (ref 0.0–0.1)
Basophils Relative: 1 %
Eosinophils Absolute: 0.2 10*3/uL (ref 0.0–0.5)
Eosinophils Relative: 5 %
HCT: 32.4 % — ABNORMAL LOW (ref 39.0–52.0)
Hemoglobin: 10.8 g/dL — ABNORMAL LOW (ref 13.0–17.0)
Immature Granulocytes: 1 %
Lymphocytes Relative: 10 %
Lymphs Abs: 0.5 10*3/uL — ABNORMAL LOW (ref 0.7–4.0)
MCH: 28.6 pg (ref 26.0–34.0)
MCHC: 33.3 g/dL (ref 30.0–36.0)
MCV: 85.9 fL (ref 80.0–100.0)
Monocytes Absolute: 0.2 10*3/uL (ref 0.1–1.0)
Monocytes Relative: 4 %
Neutro Abs: 3.7 10*3/uL (ref 1.7–7.7)
Neutrophils Relative %: 79 %
Platelets: 143 10*3/uL — ABNORMAL LOW (ref 150–400)
RBC: 3.77 MIL/uL — ABNORMAL LOW (ref 4.22–5.81)
RDW: 14.3 % (ref 11.5–15.5)
WBC: 4.7 10*3/uL (ref 4.0–10.5)
nRBC: 0 % (ref 0.0–0.2)

## 2023-01-09 LAB — COMPREHENSIVE METABOLIC PANEL
ALT: 13 U/L (ref 0–44)
AST: 22 U/L (ref 15–41)
Albumin: 3 g/dL — ABNORMAL LOW (ref 3.5–5.0)
Alkaline Phosphatase: 63 U/L (ref 38–126)
Anion gap: 8 (ref 5–15)
BUN: 6 mg/dL — ABNORMAL LOW (ref 8–23)
CO2: 26 mmol/L (ref 22–32)
Calcium: 8.3 mg/dL — ABNORMAL LOW (ref 8.9–10.3)
Chloride: 98 mmol/L (ref 98–111)
Creatinine, Ser: 0.54 mg/dL — ABNORMAL LOW (ref 0.61–1.24)
GFR, Estimated: >60 mL/min (ref >60)
Glucose, Bld: 104 mg/dL — ABNORMAL HIGH (ref 70–99)
Potassium: 3.8 mmol/L (ref 3.5–5.1)
Sodium: 132 mmol/L — ABNORMAL LOW (ref 135–145)
Total Bilirubin: 0.7 mg/dL (ref 0.3–1.2)
Total Protein: 5.9 g/dL — ABNORMAL LOW (ref 6.5–8.1)

## 2023-01-09 LAB — PHOSPHORUS: Phosphorus: 2.7 mg/dL (ref 2.5–4.6)

## 2023-01-09 LAB — MAGNESIUM: Magnesium: 2.4 mg/dL (ref 1.7–2.4)

## 2023-01-09 MED ORDER — DIPHENHYDRAMINE HCL 25 MG PO CAPS
25.0000 mg | ORAL_CAPSULE | Freq: Four times a day (QID) | ORAL | Status: DC | PRN
  Administered 2023-01-09 – 2023-01-10 (×4): 25 mg via ORAL
  Filled 2023-01-09 (×5): qty 1

## 2023-01-09 MED ORDER — FUROSEMIDE 20 MG PO TABS
20.0000 mg | ORAL_TABLET | Freq: Every day | ORAL | Status: DC
  Administered 2023-01-09 – 2023-01-12 (×4): 20 mg via ORAL
  Filled 2023-01-09 (×4): qty 1

## 2023-01-09 MED ORDER — DOXYCYCLINE HYCLATE 100 MG PO TABS
100.0000 mg | ORAL_TABLET | Freq: Two times a day (BID) | ORAL | Status: DC
  Administered 2023-01-09 – 2023-01-12 (×6): 100 mg via ORAL
  Filled 2023-01-09 (×6): qty 1

## 2023-01-09 MED ORDER — DORZOLAMIDE HCL-TIMOLOL MAL 2-0.5 % OP SOLN
1.0000 [drp] | Freq: Two times a day (BID) | OPHTHALMIC | Status: DC
  Administered 2023-01-09 – 2023-01-13 (×8): 1 [drp] via OPHTHALMIC

## 2023-01-09 NOTE — Progress Notes (Signed)
PROGRESS NOTE    Jared [name]  [id number] DOB: July 27, 1946 DOA: 01/07/2023 PCP: Jared Covert, MD   Brief Narrative:  77 year old pancreatic cancer on chemotherapy, GERD, anemia of chronic disease with a baseline hemoglobin of 10-12 as well as other comorbidities who presents with worsening left lower extremity erythema and warmth.   He has had 2 to 3 days of progressive left lower extremity erythema, associated tenderness, increased warmth and swelling and no drainage noted.   He has had subjective fevers over that timeframe but denies any chills, full body rigors or generalized myalgias.     Recently saw oncology for chemotherapy.  In the ED his Tmax was 100.1.  Labs were notable for hyponatremia and blood cultures x 2 were obtained.  Further workup reveals left lower extremity cellulitis which is improving with IV antibiotics.   Assessment and Plan: No notes have been filed under this hospital service. Service: Hospitalist  Right Lower Extremity Cellulitis - 3-day worsening and progressive left lower extremity erythema associated tenderness, increased warmth to touch as well as swelling, temporally seems to been starting temporally seems to have started end of February Left ultrasound done showed no evidence of DVT -no purulent MRSA is less likely -de-escalate ceftrixaone to doxycycline 100 bid in am -Not convinced that swelling in the leg is solely secondary to cellulitis-it seems like the swelling and issues have have been happening since February and may be secondary to hypoproteinemia given the albumin is 3.0 - I will get a BNP in the morning - We will also consider giving some more Lasix as the patient had been on Lasix previously  Hyponatremia -Continue to Monitor   Pancreatic Cancer -Has been on chemotherapy and follows with Jared Card, NP as well as Dr. Benay [name] -Resume oral Dilaudid home dosing and allow for IV Dilaudid 0.5 mg as breakthrough -Continue with  fentanyl patch  Oral thrush -Continue with nystatin 500,000 units p.o. 4 times daily  Anemia of Chronic Disease/Normocytic Anemia -Continue to monitor for signs and symptoms of bleeding; no overt bleeding noted -Repeat CBC in a.m.  Glaucoma -Continue with brimonidine 0.15% atomic solution 1 drop in left eye twice daily as well as dorzolamide-timolol 1 drop in both eyes twice daily  Thrombocytopenia -Continue to monitor for signs or symptoms of bleeding; no overt bleeding noted -Repeat CBC in the AM  Hypoalbuminemia -Continue to Monitor and Trend and repeat CMP in the AM  GERD/GI Prophylaxis -C/w PPI with Pantoprazole 40 mg po BID   DVT prophylaxis: enoxaparin (LOVENOX) injection 40 mg Start: 01/08/23 0800    Code Status: Full Code Family Communication: No family currently at bedside  Disposition Plan:  Level of care: Med-Surg Status is: Inpatient Remains inpatient appropriate because: Needs further clinical improvement prior to safe discharge disposition   Procedures:  As delineated as above and had a lower extremity duplex that was negative for DVT but did show incidentally noted left inguinal lymph node measuring 2.5 by 1.1 cm   Antimicrobials:  Anti-infectives (From admission, onward)    Start     Dose/Rate Route Frequency Ordered Stop   01/08/23 2000  cefTRIAXone (ROCEPHIN) 1 g in sodium chloride 0.9 % 100 mL IVPB        1 g 200 mL/hr over 30 Minutes Intravenous Every 24 hours 01/07/23 2358     01/07/23 1945  cefTRIAXone (ROCEPHIN) 1 g in sodium chloride 0.9 % 100 mL IVPB        1 g 200 mL/hr over  30 Minutes Intravenous  Once 01/07/23 1934 01/07/23 2035       Subjective: Doing fair no distress no chest pain no fever no chills Both lower extremities seem swollen and they have been since February 26 He relates being on Lasix recently and I have resumed this  Objective: Vitals:   01/08/23 2043 01/09/23 0416 01/09/23 0500 01/09/23 1316  BP: 117/79 117/75   125/77  Pulse: 76 71  63  Resp: '18 18  16  '$ Temp: (!) 100.8 F (38.2 C) 98.5 F (36.9 C)  99 F (37.2 C)  TempSrc: Oral Oral  Oral  SpO2: 99% 100%  99%  Weight:   96.2 kg   Height:       No intake or output data in the 24 hours ending 01/09/23 1627  Filed Weights   01/07/23 1649 01/09/23 0500  Weight: 95.3 kg 96.2 kg   Examination: Physical Exam:  Ill-appearing black male in no distress Chest clear no rales rhonchi Abdomen soft but did not do deep palpation because of underlying pain etc. Bilateral lower extremity edema Neuro intact  Data Reviewed: I have personally reviewed following labs and imaging studies  CBC: Recent Labs  Lab 01/03/23 1310 01/07/23 1815 01/08/23 0644 01/09/23 0439  WBC 4.2 6.7 6.4 4.7  NEUTROABS 2.4 6.1 5.6 3.7  HGB 11.4* 10.5* 10.0* 10.8*  HCT 34.9* 31.7* 30.4* 32.4*  MCV 86.6 85.4 86.9 85.9  PLT 279 123* 123* 143*    Basic Metabolic Panel: Recent Labs  Lab 01/03/23 1310 01/07/23 1815 01/08/23 0644 01/09/23 0439  NA 138 133* 132* 132*  K 3.7 4.1 3.9 3.8  CL 103 99 99 98  CO2 '27 28 26 26  '$ GLUCOSE 149* 151* 134* 104*  BUN 8 8 7* 6*  CREATININE 0.64 0.49* 0.54* 0.54*  CALCIUM 9.4 8.7* 8.0* 8.3*  MG  --   --  1.8 2.4  PHOS  --   --   --  2.7    GFR: Estimated Creatinine Clearance: 93 mL/min (A) (by C-G formula based on SCr of 0.54 mg/dL (L)). Liver Function Tests: Recent Labs  Lab 01/03/23 1310 01/07/23 1815 01/08/23 0644 01/09/23 0439  AST 14* '20 22 22  '$ ALT '8 9 12 13  '$ ALKPHOS 60 54 52 63  BILITOT 0.3 0.6 0.7 0.7  PROT 5.9* 5.6* 5.1* 5.9*  ALBUMIN 3.4* 3.4* 2.8* 3.0*    No results for input(s): "LIPASE", "AMYLASE" in the last 168 hours. No results for input(s): "AMMONIA" in the last 168 hours. Coagulation Profile: Recent Labs  Lab 01/07/23 1815  INR 1.1    Cardiac Enzymes: No results for input(s): "CKTOTAL", "CKMB", "CKMBINDEX", "TROPONINI" in the last 168 hours. BNP (last 3 results) No results for  input(s): "PROBNP" in the last 8760 hours. HbA1C: No results for input(s): "HGBA1C" in the last 72 hours. CBG: No results for input(s): "GLUCAP" in the last 168 hours. Lipid Profile: No results for input(s): "CHOL", "HDL", "LDLCALC", "TRIG", "CHOLHDL", "LDLDIRECT" in the last 72 hours. Thyroid Function Tests: No results for input(s): "TSH", "T4TOTAL", "FREET4", "T3FREE", "THYROIDAB" in the last 72 hours. Anemia Panel: No results for input(s): "VITAMINB12", "FOLATE", "FERRITIN", "TIBC", "IRON", "RETICCTPCT" in the last 72 hours. Sepsis Labs: Recent Labs  Lab 01/07/23 1721  LATICACIDVEN 1.3    Recent Results (from the past 240 hour(s))  Blood Culture (routine x 2)     Status: None (Preliminary result)   Collection Time: 01/07/23  5:50 PM   Specimen: BLOOD  Result Value  Ref Range Status   Specimen Description   Final    BLOOD RIGHT ANTECUBITAL Performed at [facility], [address], [place], [place] [address]    Special Requests   Final    Blood Culture adequate volume BOTTLES DRAWN AEROBIC AND ANAEROBIC Performed at [facility], [address], [place], [place] [address]    Culture   Final    NO GROWTH 2 DAYS Performed at [facility], [place] [address] [place], [place] [address]    Report Status PENDING  Incomplete  Resp panel by RT-PCR (RSV, Flu A&B, Covid) Anterior Nasal Swab     Status: None   Collection Time: 01/07/23  6:00 PM   Specimen: Anterior Nasal Swab  Result Value Ref Range Status   SARS Coronavirus 2 by RT PCR NEGATIVE NEGATIVE Final    Comment: (NOTE) SARS-CoV-2 target nucleic acids are NOT DETECTED.  The SARS-CoV-2 RNA is generally detectable in upper respiratory specimens during the acute phase of infection. The lowest concentration of SARS-CoV-2 viral copies this assay can detect is 138 copies/mL. A negative result does not preclude SARS-Cov-2 infection and should not be used as the sole basis for  treatment or other patient management decisions. A negative result may occur with  improper specimen collection/handling, submission of specimen other than nasopharyngeal swab, presence of viral mutation(s) within the areas targeted by this assay, and inadequate number of viral copies(<138 copies/mL). A negative result must be combined with clinical observations, patient history, and epidemiological information. The expected result is Negative.  Fact Sheet for Patients:  EntrepreneurPulse.com.au  Fact Sheet for Healthcare Providers:  IncredibleEmployment.be  This test is no t yet approved or cleared by the [place] FDA and  has been authorized for detection and/or diagnosis of SARS-CoV-2 by FDA under an Emergency Use Authorization (EUA). This EUA will remain  in effect (meaning this test can be used) for the duration of the COVID-19 declaration under Section 564(b)(1) of the Act, 21 U.S.C.section 360bbb-3(b)(1), unless the authorization is terminated  or revoked sooner.       Influenza A by PCR NEGATIVE NEGATIVE Final   Influenza B by PCR NEGATIVE NEGATIVE Final    Comment: (NOTE) The Xpert Xpress SARS-CoV-2/FLU/RSV plus assay is intended as an aid in the diagnosis of influenza from Nasopharyngeal swab specimens and should not be used as a sole basis for treatment. Nasal washings and aspirates are unacceptable for Xpert Xpress SARS-CoV-2/FLU/RSV testing.  Fact Sheet for Patients: EntrepreneurPulse.com.au  Fact Sheet for Healthcare Providers: IncredibleEmployment.be  This test is not yet approved or cleared by the [place] FDA and has been authorized for detection and/or diagnosis of SARS-CoV-2 by FDA under an Emergency Use Authorization (EUA). This EUA will remain in effect (meaning this test can be used) for the duration of the COVID-19 declaration under Section 564(b)(1) of the Act, 21  U.S.C. section 360bbb-3(b)(1), unless the authorization is terminated or revoked.     Resp Syncytial Virus by PCR NEGATIVE NEGATIVE Final    Comment: (NOTE) Fact Sheet for Patients: EntrepreneurPulse.com.au  Fact Sheet for Healthcare Providers: IncredibleEmployment.be  This test is not yet approved or cleared by the [place] FDA and has been authorized for detection and/or diagnosis of SARS-CoV-2 by FDA under an Emergency Use Authorization (EUA). This EUA will remain in effect (meaning this test can be used) for the duration of the COVID-19 declaration under Section 564(b)(1) of the Act, 21 U.S.C. section 360bbb-3(b)(1), unless the authorization is terminated or  revoked.  Performed at [facility], [address] [place], [place] [address]   Blood Culture (routine x 2)     Status: None (Preliminary result)   Collection Time: 01/07/23  6:05 PM   Specimen: BLOOD  Result Value Ref Range Status   Specimen Description   Final    BLOOD LEFT ANTECUBITAL Performed at [facility], [address] [place], [place] [address]    Special Requests   Final    Blood Culture adequate volume BOTTLES DRAWN AEROBIC AND ANAEROBIC Performed at [facility], [address], [place], [place] [address]    Culture   Final    NO GROWTH 2 DAYS Performed at [facility], [place] [address], [place], [place] [address]    Report Status PENDING  Incomplete    Radiology Studies: US Venous Img Lower Unilateral Left  Result Date: 01/07/2023 CLINICAL DATA:  leg swelling, access for blood clot EXAM: LEFT LOWER EXTREMITY VENOUS DOPPLER ULTRASOUND TECHNIQUE: Gray-scale sonography with compression, as well as color and duplex ultrasound, were performed to evaluate the deep venous system(s) from the level of the common femoral vein through the popliteal and proximal calf veins. COMPARISON:  None Available.  FINDINGS: VENOUS Normal compressibility of the common femoral, superficial femoral, and popliteal veins, as well as the visualized calf veins. Visualized portions of profunda femoral vein and great saphenous vein unremarkable. No filling defects to suggest DVT on grayscale or color Doppler imaging. Doppler waveforms show normal direction of venous flow, normal respiratory plasticity and response to augmentation. Limited views of the contralateral common femoral vein are unremarkable. OTHER Incidentally noted left inguinal lymph node measuring 2.5 by 1.1 cm. IMPRESSION: No evidence of DVT in the left lower extremity. Electronically Signed   By: Margaretha Sheffield M.D.   On: 01/07/2023 19:02   DG Chest Port 1 View  Result Date: 01/07/2023 CLINICAL DATA:  Questionable sepsis EXAM: PORTABLE CHEST 1 VIEW COMPARISON:  Chest x-ray dated October 02, 2022 FINDINGS: Cardiac and mediastinal contours are unchanged. Left chest wall port is unchanged in position. Lungs are clear. No pleural effusion or pneumothorax. IMPRESSION: No active disease. Electronically Signed   By: Yetta Glassman M.D.   On: 01/07/2023 18:41    Scheduled Meds:  brimonidine  1 drop Left Eye BID   Chlorhexidine Gluconate Cloth  6 each Topical Daily   dorzolamide-timolol  1 drop Both Eyes BID   enoxaparin (LOVENOX) injection  40 mg Subcutaneous Q24H   fentaNYL  1 patch Transdermal Q72H   nystatin  5 mL Oral QID   pantoprazole  40 mg Oral BID   Continuous Infusions:  cefTRIAXone (ROCEPHIN)  IV 1 g (01/08/23 1943)    LOS: 2 days   Verneita Griffes, MD [facility] 4:41 PM

## 2023-01-09 NOTE — Progress Notes (Signed)
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  He is being treated with gemcitabine/Abraxane and completed a second cycle on 01/04/2023.  He reports developing a fever on 01/05/2023 and 01/06/2023.  He developed erythema and swelling of the left leg beginning 01/06/2023.  He presented to the emergency room 01/07/2023 and was admitted for further evaluation.  A left lower extremity Doppler revealed no evidence of DVT.  Objective: Vital signs in last 24 hours: Blood pressure 117/75, pulse 71, temperature 98.5 F (36.9 C), temperature source Oral, resp. rate 18, height 6' (1.829 m), weight 212 lb 1.3 oz (96.2 kg), SpO2 100 %.  Intake/Output from previous day: 03/12 0701 - 03/13 0700 In: 13 [IV Piggyback:50] Out: -   Physical Exam:  HEENT: No thrush Lungs: Clear bilaterally Cardiac: Regular rate and rhythm Abdomen: No mass, no hepatosplenomegaly Extremities: Trace edema at the right and left lower leg and foot. Skin: Erythema extending over the left foot to the medial aspect of the left lower leg and thigh.  Portacath/PICC-without erythema  Lab Results: Recent Labs    01/08/23 0644 01/09/23 0439  WBC 6.4 4.7  HGB 10.0* 10.8*  HCT 30.4* 32.4*  PLT 123* 143*    BMET Recent Labs    01/08/23 0644 01/09/23 0439  NA 132* 132*  K 3.9 3.8  CL 99 98  CO2 26 26  GLUCOSE 134* 104*  BUN 7* 6*  CREATININE 0.54* 0.54*  CALCIUM 8.0* 8.3*    Lab Results  Component Value Date   CAN199 2,762 (H) 12/12/2022    Studies/Results: US Venous Img Lower Unilateral Left  Result Date: 01/07/2023 CLINICAL DATA:  leg swelling, access for blood clot EXAM: LEFT LOWER EXTREMITY VENOUS DOPPLER ULTRASOUND TECHNIQUE: Gray-scale sonography with compression, as well as color and duplex ultrasound, were performed to evaluate the deep venous system(s) from the level of the common femoral vein through the popliteal and proximal calf veins. COMPARISON:  None Available. FINDINGS:  VENOUS Normal compressibility of the common femoral, superficial femoral, and popliteal veins, as well as the visualized calf veins. Visualized portions of profunda femoral vein and great saphenous vein unremarkable. No filling defects to suggest DVT on grayscale or color Doppler imaging. Doppler waveforms show normal direction of venous flow, normal respiratory plasticity and response to augmentation. Limited views of the contralateral common femoral vein are unremarkable. OTHER Incidentally noted left inguinal lymph node measuring 2.5 by 1.1 cm. IMPRESSION: No evidence of DVT in the left lower extremity. Electronically Signed   By: Margaretha Sheffield M.D.   On: 01/07/2023 19:02   DG Chest Port 1 View  Result Date: 01/07/2023 CLINICAL DATA:  Questionable sepsis EXAM: PORTABLE CHEST 1 VIEW COMPARISON:  Chest x-ray dated October 02, 2022 FINDINGS: Cardiac and mediastinal contours are unchanged. Left chest wall port is unchanged in position. Lungs are clear. No pleural effusion or pneumothorax. IMPRESSION: No active disease. Electronically Signed   By: Yetta Glassman M.D.   On: 01/07/2023 18:41    Medications: I have reviewed the patient's current medications.  Assessment/Plan:  Pancreas cancer FNA biopsy of a pancreas body/tail mass 07/05/2022-adenocarcinoma CT abdomen/pelvis 06/14/2022-hypoenhancing pancreas body/tail mass with effacement of the splenic vein, no evidence of lymphadenopathy or metastatic disease CA 19-9 on 06/18/2022-767 EUS 07/05/2022-a 7 x 29 mm pancreas body/tail mass, abutment of the splenic artery, no malignant appearing lymph nodes, T2N0 by EUS CTs 07/15/2022-pancreas body/tail mass with no evidence of metastatic disease, splenic vein associated with the tumor, no  arterial involvement, stable right greater than left lung nodules favored benign Cycle 1 FOLFOX 08/01/2022 Cycle 2 FOLFOX, 08/16/2022, neulasta Cycle 3 FOLFIRINOX 08/29/2022, Neulasta Cycle 4 FOLFIRINOX 09/12/2022,  Neulasta, irinotecan and 5-fluorouracil dose reduced due to diarrhea/weight loss Cycle 5 FOLFIRINOX 09/26/2022 CT abdomen/pelvis 10/09/2022-no significant change in the appearance of the ill-defined pancreatic tail mass.  Persistent extrinsic mass effect on the splenic vein which remains patent.  Indeterminate left adrenal nodule. 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  Foundation 1-microsatellite stable, tumor mutation burden 1, K-ras G12R CTs 12/12/2022-increasing size of pancreatic mass with suspected metastatic disease involving the lesser sac and adjacent stomach.  New area of masslike nodularity anterior to the pancreatic neck appears to tether the gastric antrum.  Worsening of peritoneal disease along the inferior surface of the right hepatic margin.  Lesion along the margin of the sigmoid colon is favored to represent a tumor implant.  Enlarging left adrenal gland in close proximity to the pancreatic lesion.  Scattered more conspicuous areas of peritoneal and omental involvement.  Vascular involvement seen on previous imaging most notably of the splenic vein and splenic artery. Cycle 1 gemcitabine/Abraxane 12/13/2022 Chemotherapy held 12/27/2022 due to mild neutropenia Cycle 2 gemcitabine/Abraxane 01/03/2023 Prostate cancer, status post prostatectomy 1990 Chronic elevation of the PSA-maintained on Trelstar, followed by Dr. Jeffie Pollock   3.  Kidney stones 4.   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  Abdominal pain, secondary to #1 Celiac plexus block 11/29/2022 6.  Family history of prostate and pancreas cancer 7.  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  Typhlitis in 2014 9.  Colon polyps-tubular adenomas on colonoscopy 03/27/2022 10.  Presentation emergency room  10/01/2022 and 10/02/2022 with nausea/vomiting, diarrhea, and chest pain-esophagitis 11.  Admission 01/07/2023 with a fever and left leg erythema  Jared [name] is now at day 6 following cycle 2 gemcitabine/Abraxane given for treatment of metastatic pancreas cancer.  He was admitted with a fever and erythema of the left leg.  He has chronic leg edema.  I suspect the fever and leg erythema are related to gemcitabine.  Gemcitabine can cause a rash, often mistaken for cellulitis.  There is persistent mild erythema of the left leg.  The rash can be treated with Benadryl and if needed prednisone.  Blood cultures are negative.there is no other apparent source for infection.  He had a low-grade fever last night which could be related to tumor fever, gemcitabine, or infection.  I think it is reasonable to complete an patient course of antibiotics in case the left leg erythema is indicative of cellulitis.  Recommendations:  1.  Complete course of oral antibiotics for possible cellulitis  2.  Continue current narcotic pain regimen  3.  Trial of Benadryl for the left leg erythema  4.  Outpatient follow-up as scheduled with Cancer center   LOS: 2 days   Betsy Coder, MD   01/09/2023, 9:20 AM

## 2023-01-09 NOTE — TOC Initial Note (Signed)
Transition of Care [facility]) - Initial/Assessment Note    Patient Details  Name: Jared [name] MRN: [id number] Date of Birth: [birth year]  Transition of Care [facility]) CM/SW Contact:    Ninfa Meeker, RN Phone Number: 01/09/2023, 9:25 AM  Clinical Narrative:                  Transition of Care [facility]) Department has reviewed patient and no TOC needs have been identified at this time. We will continue to monitor patient advancement through Interdisciplinary progressions and if new patient needs arise, please place a consult.        Patient Goals and CMS Choice            Expected Discharge Plan and Services                                              Prior Living Arrangements/Services                       Activities of Daily Living Home Assistive Devices/Equipment: None ADL Screening (condition at time of admission) Patient's cognitive ability adequate to safely complete daily activities?: Yes Is the patient deaf or have difficulty hearing?: No Does the patient have difficulty seeing, even when wearing glasses/contacts?: No Does the patient have difficulty concentrating, remembering, or making decisions?: Yes Patient able to express need for assistance with ADLs?: Yes Does the patient have difficulty dressing or bathing?: No Independently performs ADLs?: Yes (appropriate for developmental age) Does the patient have difficulty walking or climbing stairs?: No Weakness of Legs: Both Weakness of Arms/Hands: None  Permission Sought/Granted                  Emotional Assessment              Admission diagnosis:  Cellulitis of left lower extremity [L03.116] Left leg cellulitis [L03.116] Patient Active Problem List   Diagnosis Date Noted   Anemia of chronic disease 01/08/2023   Cellulitis of left lower extremity 01/07/2023   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[place]) 11/27/2022   Family history of pancreatic cancer 10/17/2022   Family  history of prostate cancer 10/17/2022   Esophagitis 10/02/2022   Leukocytosis 10/02/2022   Hyperglycemia 10/02/2022   Aortic atherosclerosis ([place]) 10/02/2022   Bilateral cataracts 10/02/2022   Glaucoma 10/02/2022   Genetic testing 09/06/2022   Choledocholithiasis 07/23/2022   Elevated CA 19-9 level 07/23/2022   H/O prostate cancer 07/23/2022   Cancer of pancreas, body ([place]) 07/17/2022   Generalized abdominal pain 03/20/2022   Hyperlipidemia 09/06/2021   Abnormal white blood cell (WBC) count 09/16/2019   Hx of inguinal hernia surgery 03/12/2015   Kidney stone 06/24/2013   DJD (degenerative joint disease) 02/21/2011   OSTEOPENIA 03/24/2009   GERD (gastroesophageal reflux disease) 02/22/2009   PROSTATE CANCER 12/26/2006   PCP:  [name], MD Pharmacy:   [facility] [place] [address] Phone: [phone] Fax: [phone]     Social Determinants of Health (SDOH) Social History: SDOH Screenings   Food Insecurity: No Food Insecurity (01/08/2023)  Housing: Low Risk  (01/08/2023)  Transportation Needs: No Transportation Needs (01/08/2023)  Utilities: Not At Risk (01/08/2023)  Depression (PHQ2-9): Low Risk  (10/16/2022)  Financial Resource Strain: Low Risk  (07/26/2022)  Social Connections: Socially Integrated (07/26/2022)  Tobacco Use: Medium Risk (01/08/2023)   SDOH Interventions:     Readmission Risk Interventions     No data to display

## 2023-01-10 ENCOUNTER — Inpatient Hospital Stay: Payer: Medicare HMO

## 2023-01-10 ENCOUNTER — Inpatient Hospital Stay: Payer: Medicare HMO | Admitting: Oncology

## 2023-01-10 DIAGNOSIS — L03116 Cellulitis of left lower limb: Secondary | ICD-10-CM | POA: Diagnosis not present

## 2023-01-10 LAB — CBC WITH DIFFERENTIAL/PLATELET
Abs Immature Granulocytes: 0.04 10*3/uL (ref 0.00–0.07)
Basophils Absolute: 0 10*3/uL (ref 0.0–0.1)
Basophils Relative: 1 %
Eosinophils Absolute: 0.3 10*3/uL (ref 0.0–0.5)
Eosinophils Relative: 8 %
HCT: 30.7 % — ABNORMAL LOW (ref 39.0–52.0)
Hemoglobin: 10 g/dL — ABNORMAL LOW (ref 13.0–17.0)
Immature Granulocytes: 1 %
Lymphocytes Relative: 20 %
Lymphs Abs: 0.7 10*3/uL (ref 0.7–4.0)
MCH: 28.1 pg (ref 26.0–34.0)
MCHC: 32.6 g/dL (ref 30.0–36.0)
MCV: 86.2 fL (ref 80.0–100.0)
Monocytes Absolute: 0.4 10*3/uL (ref 0.1–1.0)
Monocytes Relative: 11 %
Neutro Abs: 2.1 10*3/uL (ref 1.7–7.7)
Neutrophils Relative %: 59 %
Platelets: 127 10*3/uL — ABNORMAL LOW (ref 150–400)
RBC: 3.56 MIL/uL — ABNORMAL LOW (ref 4.22–5.81)
RDW: 14.2 % (ref 11.5–15.5)
WBC: 3.5 10*3/uL — ABNORMAL LOW (ref 4.0–10.5)
nRBC: 0 % (ref 0.0–0.2)

## 2023-01-10 LAB — COMPREHENSIVE METABOLIC PANEL
ALT: 13 U/L (ref 0–44)
AST: 21 U/L (ref 15–41)
Albumin: 2.8 g/dL — ABNORMAL LOW (ref 3.5–5.0)
Alkaline Phosphatase: 58 U/L (ref 38–126)
Anion gap: 8 (ref 5–15)
BUN: 5 mg/dL — ABNORMAL LOW (ref 8–23)
CO2: 26 mmol/L (ref 22–32)
Calcium: 8.3 mg/dL — ABNORMAL LOW (ref 8.9–10.3)
Chloride: 99 mmol/L (ref 98–111)
Creatinine, Ser: 0.55 mg/dL — ABNORMAL LOW (ref 0.61–1.24)
GFR, Estimated: >60 mL/min (ref >60)
Glucose, Bld: 102 mg/dL — ABNORMAL HIGH (ref 70–99)
Potassium: 3.5 mmol/L (ref 3.5–5.1)
Sodium: 133 mmol/L — ABNORMAL LOW (ref 135–145)
Total Bilirubin: 0.7 mg/dL (ref 0.3–1.2)
Total Protein: 5.5 g/dL — ABNORMAL LOW (ref 6.5–8.1)

## 2023-01-10 LAB — BRAIN NATRIURETIC PEPTIDE: B Natriuretic Peptide: 155.6 pg/mL — ABNORMAL HIGH (ref 0.0–100.0)

## 2023-01-10 MED ORDER — PROMETHAZINE HCL 25 MG PO TABS: 12.5000 mg | ORAL_TABLET | Freq: Four times a day (QID) | ORAL | Status: DC | PRN

## 2023-01-10 MED ORDER — PREDNISONE 10 MG PO TABS
10.0000 mg | ORAL_TABLET | Freq: Every day | ORAL | Status: DC
  Administered 2023-01-10 – 2023-01-13 (×4): 10 mg via ORAL
  Filled 2023-01-10 (×4): qty 1

## 2023-01-10 NOTE — Progress Notes (Signed)
IP PROGRESS NOTE  Subjective:   Jared [name] continues to have abdominal pain.  He reports his pain is controlled with the current narcotic regimen.  He complains of nausea this morning.  His wife is present for today's visit by telephone.  Objective: Vital signs in last 24 hours: Blood pressure 113/74, pulse 64, temperature 98.1 F (36.7 C), temperature source Oral, resp. rate 18, height 6' (1.829 m), weight 209 lb 7 oz (95 kg), SpO2 100 %.  Intake/Output from previous day: No intake/output data recorded.  Physical Exam:  HEENT: No thrush  Abdomen: No mass, no hepatosplenomegaly, soft, diffuse tenderness Extremities: Trace edema at the right and left lower leg and foot. Skin: Diminished erythema extending over the left foot to the medial aspect of the left lower leg and thigh.  Portacath/PICC-without erythema  Lab Results: Recent Labs    01/09/23 0439 01/10/23 0535  WBC 4.7 3.5*  HGB 10.8* 10.0*  HCT 32.4* 30.7*  PLT 143* 127*    BMET Recent Labs    01/09/23 0439 01/10/23 0535  NA 132* 133*  K 3.8 3.5  CL 98 99  CO2 26 26  GLUCOSE 104* 102*  BUN 6* 5*  CREATININE 0.54* 0.55*  CALCIUM 8.3* 8.3*    Lab Results  Component Value Date   [id number] 2,762 (H) 12/12/2022     Medications: I have reviewed the patient's current medications.  Assessment/Plan:  Pancreas cancer FNA biopsy of a pancreas body/tail mass 07/05/2022-adenocarcinoma CT abdomen/pelvis 06/14/2022-hypoenhancing pancreas body/tail mass with effacement of the splenic vein, no evidence of lymphadenopathy or metastatic disease CA 19-9 on 06/18/2022-767 EUS 07/05/2022-a 7 x 29 mm pancreas body/tail mass, abutment of the splenic artery, no malignant appearing lymph nodes, T2N0 by EUS CTs 07/15/2022-pancreas body/tail mass with no evidence of metastatic disease, splenic vein associated with the tumor, no arterial involvement, stable right greater than left lung nodules favored benign Cycle 1 FOLFOX  08/01/2022 Cycle 2 FOLFOX, 08/16/2022, neulasta Cycle 3 FOLFIRINOX 08/29/2022, Neulasta Cycle 4 FOLFIRINOX 09/12/2022, Neulasta, irinotecan and 5-fluorouracil dose reduced due to diarrhea/weight loss Cycle 5 FOLFIRINOX 09/26/2022 CT abdomen/pelvis 10/09/2022-no significant change in the appearance of the ill-defined pancreatic tail mass.  Persistent extrinsic mass effect on the splenic vein which remains patent.  Indeterminate left adrenal nodule. 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  Foundation 1-microsatellite stable, tumor mutation burden 1, K-ras G12R CTs 12/12/2022-increasing size of pancreatic mass with suspected metastatic disease involving the lesser sac and adjacent stomach.  New area of masslike nodularity anterior to the pancreatic neck appears to tether the gastric antrum.  Worsening of peritoneal disease along the inferior surface of the right hepatic margin.  Lesion along the margin of the sigmoid colon is favored to represent a tumor implant.  Enlarging left adrenal gland in close proximity to the pancreatic lesion.  Scattered more conspicuous areas of peritoneal and omental involvement.  Vascular involvement seen on previous imaging most notably of the splenic vein and splenic artery. Cycle 1 gemcitabine/Abraxane 12/13/2022 Chemotherapy held 12/27/2022 due to mild neutropenia Cycle 2 gemcitabine/Abraxane 01/03/2023 Prostate cancer, status post prostatectomy 1990 Chronic elevation of the PSA-maintained on Trelstar, followed by Dr. Jeffie Pollock   3.  Kidney stones 4.   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  Abdominal pain, secondary to #1 Celiac plexus block 11/29/2022 6.  Family history of prostate and pancreas cancer 7.  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   Typhlitis in 2014 9.  Colon polyps-tubular adenomas on colonoscopy 03/27/2022 10.  Presentation emergency room 10/01/2022 and 10/02/2022 with nausea/vomiting, diarrhea, and chest pain-esophagitis 11.  Admission 01/07/2023 with a fever and left leg erythema  Jared [name] is now at day 7 following cycle 2 gemcitabine/Abraxane given for treatment of metastatic pancreas cancer.  He was admitted with a fever and erythema of the left leg.  He has chronic leg edema.  I suspect the fever and leg erythema are related to gemcitabine.  He has no further fever and cultures remain negative.  No source for infection has been identified.  The leg erythema appears improved.  Jared [name] has nausea and anorexia.  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  He has an incurable malignancy.  I discussed this with Jared [name] and his wife this morning.  He has completed only 2 treatments with gemcitabine/Abraxane.  It is too early to assess the effectiveness of this therapy.  If his clinical status continues to decline we will need to consider comfort/hospice care.  He appears stable for discharge from the hospital if he is ambulatory, has adequate pain control, and is tolerating a diet.  The thrombocytopenia secondary to chemotherapy.  The leg edema is secondary to hypoalbuminemia and abdominal carcinomatosis.  Recommendations:  1.  Complete course of oral antibiotics for possible cellulitis  2.  Continue current narcotic pain regimen  3.  Benadryl as needed for the left leg erythema  4.  Prednisone for nausea and appetite  5.  Phenergan as needed for nausea  6.  Outpatient follow-up as scheduled with Cancer center   LOS: 3 days   Betsy Coder, MD   01/10/2023, 7:47 AM

## 2023-01-10 NOTE — Progress Notes (Signed)
PROGRESS NOTE    Jared [name]  [id number] DOB: 05/22/1946 DOA: 01/07/2023 PCP: [name], MD   Brief Narrative:  77 year old pancreatic cancer on chemotherapy, GERD, anemia of chronic disease with a baseline hemoglobin of 10-12 as well as other comorbidities who presents with worsening left lower extremity erythema and warmth.   He has had 2 to 3 days of progressive left lower extremity erythema, associated tenderness, increased warmth and swelling and no drainage noted.   He has had subjective fevers over that timeframe but denies any chills, full body rigors or generalized myalgias.     Recently saw oncology for chemotherapy.  In the ED his Tmax was 100.1.  Labs were notable for hyponatremia and blood cultures x 2 were obtained.  Further workup reveals left lower extremity cellulitis which is improving with IV antibiotics.   Assessment and Plan: No notes have been filed under this hospital service. Service: Hospitalist  Right Lower Extremity Cellulitis - 3-day worsening and progressive left lower extremity erythema associated tenderness, increased warmth to touch as well as swelling, temporally related to chemo Left ultrasound done showed no evidence of DVT -Ceftrixaone to doxycycline 100 bid, complete 5 days total -Unlikely secondary to cellulitis-it seems like the swelling and issues have have been happening since February and may be secondary to hypoproteinemia given the albumin is 3.0 -BNP only slightly elevated 155, Lasix 20 daily for 1 more day and then discontinue  Hyponatremia -Continue to Monitor   Pancreatic Cancer -Has been on chemotherapy and follows with [name], NP as well as Dr. [name] -Continue 4 to 8 mg every 4 as needed oral Dilaudid, discontinue IV Dilaudid breakthrough -Continue with fentanyl patch -Patient and wife will discuss in the outpatient setting with Dr. [name] goals of care - I had a discussion with Dr. [name] on 3/14 with  regards to the goals and it does not seem like family comprehends the import of metastatic pancreatic cancer and these discussions will need to continue as an outpatient  Oral thrush -Continue with nystatin 500,000 units p.o. 4 times daily  Anemia of Chronic Disease/Normocytic Anemia -Continue to monitor for signs and symptoms of bleeding; no overt bleeding noted -Repeat CBC in a.m.  Glaucoma -Continue with brimonidine 0.15% atomic solution 1 drop in left eye twice daily as well as dorzolamide-timolol 1 drop in both eyes twice daily  Thrombocytopenia -Continue to monitor for signs or symptoms of bleeding; no overt bleeding noted -Repeat CBC in the AM  Hypoalbuminemia -Continue to Monitor and Trend and repeat CMP in the AM -Patient is eating about 10 to 15% of meals and we will keep him here overnight to ensure he keeps up with his nutrition - He is an exceedingly poor candidate for TPN or parenteral nutrition given his life-limiting illness  GERD/GI Prophylaxis -C/w PPI with Pantoprazole 40 mg po BID   DVT prophylaxis: Place TED hose Start: 01/09/23 1635 enoxaparin (LOVENOX) injection 40 mg Start: 01/08/23 0800    Code Status: Full Code Family Communication: No family currently at bedside  Disposition Plan:  Level of care: Med-Surg Status is: Inpatient Remains inpatient appropriate because: Needs further clinical improvement prior to safe discharge disposition   Procedures:  As delineated as above and had a lower extremity duplex that was negative for DVT but did show incidentally noted left inguinal lymph node measuring 2.5 by 1.1 cm   Antimicrobials:  Anti-infectives (From admission, onward)    Start     Dose/Rate Route Frequency Ordered Stop  01/09/23 2200  doxycycline (VIBRA-TABS) tablet 100 mg        100 mg Oral Every 12 hours 01/09/23 1628     01/08/23 2000  cefTRIAXone (ROCEPHIN) 1 g in sodium chloride 0.9 % 100 mL IVPB  Status:  Discontinued        1 g 200  mL/hr over 30 Minutes Intravenous Every 24 hours 01/07/23 2358 01/09/23 1628   01/07/23 1945  cefTRIAXone (ROCEPHIN) 1 g in sodium chloride 0.9 % 100 mL IVPB        1 g 200 mL/hr over 30 Minutes Intravenous  Once 01/07/23 1934 01/07/23 2035       Subjective:  Eating better today pain is controlled TED hose are on No fever no chills No cough no cold  Objective: Vitals:   01/09/23 2102 01/10/23 0500 01/10/23 0553 01/10/23 1205  BP: 134/88  113/74 137/76  Pulse: 70  64 66  Resp: '19  18 16  '$ Temp: 98 F (36.7 C)  98.1 F (36.7 C) 98.3 F (36.8 C)  TempSrc:   Oral Oral  SpO2: 98%  100% 100%  Weight:  95 kg    Height:        Intake/Output Summary (Last 24 hours) at 01/10/2023 1404 Last data filed at 01/10/2023 1300 Gross per 24 hour  Intake 220 ml  Output --  Net 220 ml    Filed Weights   01/07/23 1649 01/09/23 0500 01/10/23 0500  Weight: 95.3 kg 96.2 kg 95 kg   Examination: Physical Exam:  Ill-appearing black male noted stress Chest clear no rales rhonchi S1-S2 no murmur no rub no gallop Abdomen soft but did not do deep palpation because of underlying pain etc. Bilateral lower extremity edema Neuro intact  Data Reviewed: I have personally reviewed following labs and imaging studies  CBC: Recent Labs  Lab 01/03/23 1310 01/07/23 1815 01/08/23 0644 01/09/23 0439 01/10/23 0535  WBC 4.2 6.7 6.4 4.7 3.5*  NEUTROABS 2.4 6.1 5.6 3.7 2.1  HGB 11.4* 10.5* 10.0* 10.8* 10.0*  HCT 34.9* 31.7* 30.4* 32.4* 30.7*  MCV 86.6 85.4 86.9 85.9 86.2  PLT 279 123* 123* 143* 127*    Basic Metabolic Panel: Recent Labs  Lab 01/03/23 1310 01/07/23 1815 01/08/23 0644 01/09/23 0439 01/10/23 0535  NA 138 133* 132* 132* 133*  K 3.7 4.1 3.9 3.8 3.5  CL 103 99 99 98 99  CO2 '27 28 26 26 26  '$ GLUCOSE 149* 151* 134* 104* 102*  BUN 8 8 7* 6* 5*  CREATININE 0.64 0.49* 0.54* 0.54* 0.55*  CALCIUM 9.4 8.7* 8.0* 8.3* 8.3*  MG  --   --  1.8 2.4  --   PHOS  --   --   --  2.7  --      GFR: Estimated Creatinine Clearance: 92.5 mL/min (A) (by C-G formula based on SCr of 0.55 mg/dL (L)). Liver Function Tests: Recent Labs  Lab 01/03/23 1310 01/07/23 1815 01/08/23 0644 01/09/23 0439 01/10/23 0535  AST 14* '20 22 22 21  '$ ALT '8 9 12 13 13  '$ ALKPHOS 60 54 52 63 58  BILITOT 0.3 0.6 0.7 0.7 0.7  PROT 5.9* 5.6* 5.1* 5.9* 5.5*  ALBUMIN 3.4* 3.4* 2.8* 3.0* 2.8*    No results for input(s): "LIPASE", "AMYLASE" in the last 168 hours. No results for input(s): "AMMONIA" in the last 168 hours. Coagulation Profile: Recent Labs  Lab 01/07/23 1815  INR 1.1    Cardiac Enzymes: No results for input(s): "CKTOTAL", "CKMB", "CKMBINDEX", "  TROPONINI" in the last 168 hours. BNP (last 3 results) No results for input(s): "PROBNP" in the last 8760 hours. HbA1C: No results for input(s): "HGBA1C" in the last 72 hours. CBG: No results for input(s): "GLUCAP" in the last 168 hours. Lipid Profile: No results for input(s): "CHOL", "HDL", "LDLCALC", "TRIG", "CHOLHDL", "LDLDIRECT" in the last 72 hours. Thyroid Function Tests: No results for input(s): "TSH", "T4TOTAL", "FREET4", "T3FREE", "THYROIDAB" in the last 72 hours. Anemia Panel: No results for input(s): "VITAMINB12", "FOLATE", "FERRITIN", "TIBC", "IRON", "RETICCTPCT" in the last 72 hours. Sepsis Labs: Recent Labs  Lab 01/07/23 1721  LATICACIDVEN 1.3    Recent Results (from the past 240 hour(s))  Blood Culture (routine x 2)     Status: None (Preliminary result)   Collection Time: 01/07/23  5:50 PM   Specimen: BLOOD  Result Value Ref Range Status   Specimen Description   Final    BLOOD RIGHT ANTECUBITAL Performed at [facility], [address], [place], [place] [address]    Special Requests   Final    Blood Culture adequate volume BOTTLES DRAWN AEROBIC AND ANAEROBIC Performed at [facility], [address], [place], [place] [address]    Culture   Final    NO GROWTH 3  DAYS Performed at [facility], [place] [address] [place], [place] [address]    Report Status PENDING  Incomplete  Resp panel by RT-PCR (RSV, Flu A&B, Covid) Anterior Nasal Swab     Status: None   Collection Time: 01/07/23  6:00 PM   Specimen: Anterior Nasal Swab  Result Value Ref Range Status   SARS Coronavirus 2 by RT PCR NEGATIVE NEGATIVE Final    Comment: (NOTE) SARS-CoV-2 target nucleic acids are NOT DETECTED.  The SARS-CoV-2 RNA is generally detectable in upper respiratory specimens during the acute phase of infection. The lowest concentration of SARS-CoV-2 viral copies this assay can detect is 138 copies/mL. A negative result does not preclude SARS-Cov-2 infection and should not be used as the sole basis for treatment or other patient management decisions. A negative result may occur with  improper specimen collection/handling, submission of specimen other than nasopharyngeal swab, presence of viral mutation(s) within the areas targeted by this assay, and inadequate number of viral copies(<138 copies/mL). A negative result must be combined with clinical observations, patient history, and epidemiological information. The expected result is Negative.  Fact Sheet for Patients:  EntrepreneurPulse.com.au  Fact Sheet for Healthcare Providers:  IncredibleEmployment.be  This test is no t yet approved or cleared by the [place] FDA and  has been authorized for detection and/or diagnosis of SARS-CoV-2 by FDA under an Emergency Use Authorization (EUA). This EUA will remain  in effect (meaning this test can be used) for the duration of the COVID-19 declaration under Section 564(b)(1) of the Act, 21 U.S.C.section 360bbb-3(b)(1), unless the authorization is terminated  or revoked sooner.       Influenza A by PCR NEGATIVE NEGATIVE Final   Influenza B by PCR NEGATIVE NEGATIVE Final    Comment: (NOTE) The Xpert Xpress SARS-CoV-2/FLU/RSV  plus assay is intended as an aid in the diagnosis of influenza from Nasopharyngeal swab specimens and should not be used as a sole basis for treatment. Nasal washings and aspirates are unacceptable for Xpert Xpress SARS-CoV-2/FLU/RSV testing.  Fact Sheet for Patients: EntrepreneurPulse.com.au  Fact Sheet for Healthcare Providers: IncredibleEmployment.be  This test is not yet approved or cleared by the [place] FDA and has been authorized for detection and/or  diagnosis of SARS-CoV-2 by FDA under an Emergency Use Authorization (EUA). This EUA will remain in effect (meaning this test can be used) for the duration of the COVID-19 declaration under Section 564(b)(1) of the Act, 21 U.S.C. section 360bbb-3(b)(1), unless the authorization is terminated or revoked.     Resp Syncytial Virus by PCR NEGATIVE NEGATIVE Final    Comment: (NOTE) Fact Sheet for Patients: EntrepreneurPulse.com.au  Fact Sheet for Healthcare Providers: IncredibleEmployment.be  This test is not yet approved or cleared by the [place] FDA and has been authorized for detection and/or diagnosis of SARS-CoV-2 by FDA under an Emergency Use Authorization (EUA). This EUA will remain in effect (meaning this test can be used) for the duration of the COVID-19 declaration under Section 564(b)(1) of the Act, 21 U.S.C. section 360bbb-3(b)(1), unless the authorization is terminated or revoked.  Performed at [facility], [address], [place], [place] [address]   Blood Culture (routine x 2)     Status: None (Preliminary result)   Collection Time: 01/07/23  6:05 PM   Specimen: BLOOD  Result Value Ref Range Status   Specimen Description   Final    BLOOD LEFT ANTECUBITAL Performed at [facility], [address], [place], [place] [address]    Special Requests   Final    Blood Culture adequate volume  BOTTLES DRAWN AEROBIC AND ANAEROBIC Performed at [facility], [address] [place], [place] [address]    Culture   Final    NO GROWTH 3 DAYS Performed at [facility], [place] [address], [place],  [address]    Report Status PENDING  Incomplete    Radiology Studies: No results found.  Scheduled Meds:  brimonidine  1 drop Left Eye BID   Chlorhexidine Gluconate Cloth  6 each Topical Daily   dorzolamide-timolol  1 drop Both Eyes BID   doxycycline  100 mg Oral Q12H   enoxaparin (LOVENOX) injection  40 mg Subcutaneous Q24H   fentaNYL  1 patch Transdermal Q72H   furosemide  20 mg Oral Daily   nystatin  5 mL Oral QID   pantoprazole  40 mg Oral BID   predniSONE  10 mg Oral Q breakfast   Continuous Infusions:    LOS: 3 days   Verneita Griffes, MD [facility] 2:04 PM

## 2023-01-11 ENCOUNTER — Other Ambulatory Visit (HOSPITAL_COMMUNITY): Payer: Self-pay

## 2023-01-11 DIAGNOSIS — Z7189 Other specified counseling: Secondary | ICD-10-CM

## 2023-01-11 DIAGNOSIS — Z515 Encounter for palliative care: Secondary | ICD-10-CM

## 2023-01-11 DIAGNOSIS — G893 Neoplasm related pain (acute) (chronic): Secondary | ICD-10-CM

## 2023-01-11 DIAGNOSIS — L03116 Cellulitis of left lower limb: Secondary | ICD-10-CM | POA: Diagnosis not present

## 2023-01-11 MED ORDER — DIPHENHYDRAMINE HCL 25 MG PO CAPS
25.0000 mg | ORAL_CAPSULE | Freq: Four times a day (QID) | ORAL | 0 refills | Status: DC | PRN
  Filled 2023-01-11: qty 30, 8d supply, fill #0

## 2023-01-11 MED ORDER — HYDROMORPHONE HCL 2 MG PO TABS
8.0000 mg | ORAL_TABLET | ORAL | Status: DC | PRN
  Administered 2023-01-11 – 2023-01-13 (×10): 8 mg via ORAL
  Filled 2023-01-11 (×10): qty 4

## 2023-01-11 MED ORDER — HEPARIN SOD (PORK) LOCK FLUSH 100 UNIT/ML IV SOLN
500.0000 [IU] | INTRAVENOUS | Status: DC | PRN
  Filled 2023-01-11: qty 5

## 2023-01-11 MED ORDER — HYDROMORPHONE HCL 1 MG/ML IJ SOLN
0.5000 mg | INTRAMUSCULAR | Status: DC | PRN
  Administered 2023-01-11 – 2023-01-12 (×2): 0.5 mg via INTRAVENOUS
  Filled 2023-01-11 (×2): qty 0.5

## 2023-01-11 MED ORDER — PREDNISONE 10 MG PO TABS
10.0000 mg | ORAL_TABLET | Freq: Every day | ORAL | 0 refills | Status: DC
  Filled 2023-01-11: qty 7, 7d supply, fill #0

## 2023-01-11 MED ORDER — FUROSEMIDE 20 MG PO TABS
20.0000 mg | ORAL_TABLET | ORAL | 0 refills | Status: DC
  Filled 2023-01-11: qty 10, 20d supply, fill #0

## 2023-01-11 NOTE — Progress Notes (Signed)
Patient stated that he was able to tolerate his meal yesterday for supper,

## 2023-01-11 NOTE — Progress Notes (Signed)
Discharge canceled secondary to patient having active nausea vomiting-it seems that the as needed dosage of Dilaudid 4 to 8 mg was only being given at the dose of 40 mg I have adjusted the MAR to reflect 8 mg We have also placed patient back on IV Dilaudid I had a good conversation with patient's wife and patient and they are amenable to discussion with palliative care with regards to goals of care as I have mentioned to them clearly that I do not think that patient has any longevity and is not a good candidate for parenteral feeds for nutrition I have discussed with them symptom management.  Verneita Griffes, MD [facility] 1:54 PM

## 2023-01-11 NOTE — Consult Note (Signed)
Palliative Care Consult Note                                  Date: 01/11/2023   Patient Name: Jared [name]  DOB: 28-Jul-1946  MRN: [id number]  Age / Sex: 77 y.o., male  PCP: [name], MD Referring Physician: Nita Sells, MD  Reason for Consultation: Establishing goals of care  HPI/Patient Profile: 77 y.o. male  with past medical history of pancreatic cancer on chemotherapy, anemia of chronic disease, GERD, CKD, and hyperlipidemia who presented to the ED on 01/07/2023 with worsening left lower extremity erythema and warmth.  In the ED his Tmax was 100.1 and labs were notable for hyponatremia.  Patient initially admitted for cellulitis but it was further determined by oncology that the fever and LLE swelling and erythema may be secondary to chemotherapy.  Palliative Medicine was consulted for goals of care in the setting of advanced pancreatic cancer.    Subjective:   I have reviewed medical records including progress notes, labs and imaging. Note that discharge was cancelled today due to nausea/vomiting.   I met with patient and his wife/[name] at bedside to discuss diagnosis, prognosis, GOC, disposition, and options. Atavion is currently feeling better than earlier today.  It seems that he had been receiving only 4 mg of Dilaudid while in the hospital when he has been taking 8 mg at home.  Patient and wife confirm that he takes 8 mg Dilaudid every 4 hours around-the-clock at home.  I introduced Palliative Medicine as specialized medical care for people living with serious illness. It focuses on providing relief from the symptoms and stress of a serious illness.   A brief life review was discussed. Kelven and Sunday Spillers have been married for 55 years. They met while attending [employer]. Prajwal worked in Careers information officer.  They lived in [place], [place]  for 30 years but moved back to [facility] [place] [facility] after [name] retired.  They have 1 daughter  who lives in [place].   We discussed patient's current illness and what it means in the larger context of his ongoing co-morbidities. Current clinical status was reviewed. Natural disease trajectory of advanced cancer was discussed.  Patient and wife both verbalized understanding that metastatic cancer is a noncurable illness and that intent of treatment is palliative in nature.  Values and goals of care important to patient and family were attempted to be elicited.  Sunday Spillers shares that Ryerson Incdoes not tolerate chemo the greatest".  She feels that his cancer has "changed" and progressed. She is contemplating whether or not Shey should pursue additional chemotherapy.  Outpatient palliative and hospice services were explained and offered.  Discussion on the difference between Palliative and Hospice care was had per family request.  Discussed that hospice provides a holistic approach to care in the setting of advanced/end-stage illness, usually I when the expected prognosis is 6 months or less.  Discussed that the focus is on symptom management and quality of life while allowing the natural course to occur.  Discussed that Benajamin would be eligible for hospice at home if/when he does not want to pursue additional cancer treatment.  A discussion was had today regarding advanced directives. Concepts specific to code status, artifical feeding and hydration, continued IV antibiotics and rehospitalization was had.  The MOST form was introduced and discussed. "Hard choices" book was provided.   Questions and concerns addressed.  Patient and family are  agreeable to referral to outpatient palliative provider at the [facility].    Review of Systems  Constitutional:  Positive for appetite change.  Gastrointestinal:  Positive for nausea.    Objective:   Primary Diagnoses: Present on Admission:  Cellulitis of left lower extremity  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HCC)  GERD (gastroesophageal  reflux disease)  Anemia of chronic disease   Physical Exam Constitutional:      General: He is not in acute distress.    Appearance: He is ill-appearing.  Pulmonary:     Effort: Pulmonary effort is normal.  Neurological:     Mental Status: He is alert and oriented to person, place, and time.     Vital Signs:  BP (!) 142/85 (BP Location: Right Arm)   Pulse (!) 51   Temp 98.1 F (36.7 C) (Oral)   Resp 18   Ht 6' (1.829 m)   Wt 95.2 kg   SpO2 99%   BMI 28.46 kg/m   Palliative Assessment/Data: PPS 50-60%     Assessment & Plan:   SUMMARY OF RECOMMENDATIONS   Full code/full scope Patient and wife are contemplating whether they want to continue chemotherapy Referral to outpatient palliative at [facility]  Primary Decision Maker: PATIENT  Advanced Directives: HCPOA and living will documents are on file in EMR  Symptom Management:  Hydromorphone 8 mg po every 4 hours as needed for pain Ondansetron 8 mg every 8 8 hours as needed for CIN/V Fentanyl Duragesic 50 mcg/hr every 3 days  Prognosis:  Unable to determine  Discharge Planning:  Home  Discussed with: Dr. Verlon Au and RN    Thank you for allowing [place] to participate in the care of Alycia Patten  MDM - [place]   Signed by: Elie Confer, NP Palliative Medicine Team  Team Phone # [phone]  For individual providers, please see AMION

## 2023-01-11 NOTE — Progress Notes (Signed)
IP PROGRESS NOTE  Subjective:   Jared [name] reports partial improvement in nausea and abdominal pain.  No fever.  No new complaint. Objective: Vital signs in last 24 hours: Blood pressure 120/80, pulse (!) 57, temperature 97.9 F (36.6 C), temperature source Oral, resp. rate 18, height 6' (1.829 m), weight 209 lb 14.1 oz (95.2 kg), SpO2 96 %.  Intake/Output from previous day: 03/14 0701 - 03/15 0700 In: 220 [P.O.:220] Out: -   Physical Exam:  HEENT: No thrush  Abdomen: No mass, no hepatosplenomegaly, soft, nontender Extremities: Trace edema to bilateral lower leg and foot Skin: Continued decrease in left leg and foot erythema.  Portacath/PICC-without erythema  Lab Results: Recent Labs    01/09/23 0439 01/10/23 0535  WBC 4.7 3.5*  HGB 10.8* 10.0*  HCT 32.4* 30.7*  PLT 143* 127*    BMET Recent Labs    01/09/23 0439 01/10/23 0535  NA 132* 133*  K 3.8 3.5  CL 98 99  CO2 26 26  GLUCOSE 104* 102*  BUN 6* 5*  CREATININE 0.54* 0.55*  CALCIUM 8.3* 8.3*    Lab Results  Component Value Date   [id number] 2,762 (H) 12/12/2022     Medications: I have reviewed the patient's current medications.  Assessment/Plan:  Pancreas cancer FNA biopsy of a pancreas body/tail mass 07/05/2022-adenocarcinoma CT abdomen/pelvis 06/14/2022-hypoenhancing pancreas body/tail mass with effacement of the splenic vein, no evidence of lymphadenopathy or metastatic disease CA 19-9 on 06/18/2022-767 EUS 07/05/2022-a 7 x 29 mm pancreas body/tail mass, abutment of the splenic artery, no malignant appearing lymph nodes, T2N0 by EUS CTs 07/15/2022-pancreas body/tail mass with no evidence of metastatic disease, splenic vein associated with the tumor, no arterial involvement, stable right greater than left lung nodules favored benign Cycle 1 FOLFOX 08/01/2022 Cycle 2 FOLFOX, 08/16/2022, neulasta Cycle 3 FOLFIRINOX 08/29/2022, Neulasta Cycle 4 FOLFIRINOX 09/12/2022, Neulasta, irinotecan and 5-fluorouracil dose  reduced due to diarrhea/weight loss Cycle 5 FOLFIRINOX 09/26/2022 CT abdomen/pelvis 10/09/2022-no significant change in the appearance of the ill-defined pancreatic tail mass.  Persistent extrinsic mass effect on the splenic vein which remains patent.  Indeterminate left adrenal nodule. 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  Foundation 1-microsatellite stable, tumor mutation burden 1, K-ras G12R CTs 12/12/2022-increasing size of pancreatic mass with suspected metastatic disease involving the lesser sac and adjacent stomach.  New area of masslike nodularity anterior to the pancreatic neck appears to tether the gastric antrum.  Worsening of peritoneal disease along the inferior surface of the right hepatic margin.  Lesion along the margin of the sigmoid colon is favored to represent a tumor implant.  Enlarging left adrenal gland in close proximity to the pancreatic lesion.  Scattered more conspicuous areas of peritoneal and omental involvement.  Vascular involvement seen on previous imaging most notably of the splenic vein and splenic artery. Cycle 1 gemcitabine/Abraxane 12/13/2022 Chemotherapy held 12/27/2022 due to mild neutropenia Cycle 2 gemcitabine/Abraxane 01/03/2023 Prostate cancer, status post prostatectomy 1990 Chronic elevation of the PSA-maintained on Trelstar, followed by Dr. Jeffie Pollock   3.  Kidney stones 4.   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  Abdominal pain, secondary to #1 Celiac plexus block 11/29/2022 6.  Family history of prostate and pancreas cancer 7.  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  Typhlitis in 2014 9.  Colon polyps-tubular adenomas on colonoscopy 03/27/2022 10.  Presentation  emergency room 10/01/2022 and 10/02/2022 with nausea/vomiting,  diarrhea, and chest pain-esophagitis 11.  Admission 01/07/2023 with a fever and left leg erythema  Jared [name] is now at day 8 following cycle 2 gemcitabine/Abraxane given for treatment of metastatic pancreas cancer.  He was admitted with a fever and erythema of the left leg.  He has chronic leg edema.  I suspect the fever and leg erythema are related to gemcitabine.  He has no further fever and cultures remain negative.  No source for infection has been identified.  The leg erythema and edema have improved.  Jared [name] reports improvement in nausea and abdominal pain today. He appears stable for discharge from the hospital if he is ambulatory, has adequate pain control, and is tolerating a diet.  Recommendations:  1.  Complete course of doxycycline for possible cellulitis  2.  Continue current narcotic pain regimen  3.  Benadryl as needed for the left leg erythema  4.  Prednisone for nausea and appetite  5.  Phenergan and Zofran as needed for nausea  6.  Outpatient follow-up as scheduled with Cancer center 01/17/2023   LOS: 4 days   Betsy Coder, MD   01/11/2023, 8:01 AM

## 2023-01-11 NOTE — Discharge Summary (Signed)
Physician Discharge Summary  77 year old pancreatic DOA: 01/07/2023  PCP: Jared Covert, MD  Admit date: 01/07/2023 Discharge date: 01/11/2023  Time spent: 47 minutes  Recommendations for Outpatient Follow-up:  Needs Chem-12 CBC 1 week at oncology or PCP office Limited prescription prednisone, Lasix q. other day given to patient this hospitalization as per below Will recommend close follow-up with oncology/palliative care/PCP-unclear if patient has a clear understanding of the terminal nature of his illness  Discharge Diagnoses:  MAIN problem for hospitalization   Right lower extremity cellulitis +/- effect of chemotherapy Edema probably secondary to hypoalbuminemia End-stage pancreatic cancer with poor options for treatment in the outpatient setting   Please see below for itemized issues addressed in [facility]- refer to other progress notes for clarity if needed  Discharge Condition: Guarded  Diet recommendation: Regular soft diet  Filed Weights   01/09/23 0500 01/10/23 0500 01/11/23 0500  Weight: 96.2 kg 95 kg 95.2 kg    History of present illness:  77 year old pancreatic cancer on chemotherapy, GERD, anemia of chronic disease with a baseline hemoglobin of 10-12 as well as other comorbidities who presents with worsening left lower extremity erythema and warmth.   He has had 2 to 3 days of progressive left lower extremity erythema, associated tenderness, increased warmth and swelling and no drainage noted.   He has had subjective fevers over that timeframe but denies any chills, full body rigors or generalized myalgias.      Recently saw oncology for chemotherapy.  In the ED his Tmax was 100.1.  Labs were notable for hyponatremia and blood cultures x 2 were obtained.  Further workup reveals left lower extremity cellulitis which is improving with IV antibiotics.  It was further determined during hospitalization by oncology that the swelling in the  legs as well as redness may be secondary to effect of the chemotherapy had recently received in late February as this is a class effect of this Patient had also been on Lasix previously which was restarted as below  Hospital Course:  Bilateral warmth and swelling thought initially to be cellulitis This was actually an effect of his chemo more so? -The patient did have left lower extremity erythema associated tenderness, increased warmth to touch as well as swelling, temporally related to chemo Left ultrasound done showed no evidence of DVT -Ceftrixaone to doxycycline 100 bid, complete 5 days in the hospital on 3/14 no need for further -Unlikely secondary to cellulitis-it seems like the swelling and issues have have been happening since February and may be secondary to hypoproteinemia given the albumin is 3.0 -Patient was started on Benadryl this hospitalization to help with the effect of the lower extremity redness which was bilateral-patient will go home on Benadryl -BNP only slightly elevated 155, we did place an on q. other day dosing of Lasix on discharge and this can be adjusted by his oncologist or PCP   Hyponatremia -Continue to Monitor-will need labs in a week   Pancreatic Cancer -Has been on chemotherapy and follows with Jared Card, NP as well as Dr. Benay [name] -Continue 4 to 8 mg every 4 as needed oral Dilaudid, discontinue IV Dilaudid breakthrough -Continue with fentanyl patch [patient has all prescriptions and does not need refills and can follow-up with oncology] -Patient and wife will discuss in the outpatient setting with Dr. Benay [name] goals of care - I had a discussion with Dr. Benay [name] on 3/14 with regards to the goals and it does not seem like family comprehends the  import of metastatic pancreatic cancer and these discussions will need to continue as an outpatient -Patient and his wife have demonstrated some but relatively poor understanding of the terminal nature and disease  process in my opinion and I think this needs to be teased out in the oncology outpatient office space as although he is physically robust, his cancer is progressing-I have had clear discussions with Dr. Benay [name] with regards to my opinion about this and he agrees with our assessment of his trajectory   Oral thrush -Patient did complete a course of Nystatin 500,000 units p.o. 4 times daily in the hospital   Anemia of Chronic Disease/Normocytic Anemia -Continue to monitor for signs and symptoms of bleeding; no overt bleeding noted -Repeat CBC in a.m.   Glaucoma -Continue with brimonidine 0.15% atomic solution 1 drop in left eye twice daily as well as dorzolamide-timolol 1 drop in both eyes twice daily   Thrombocytopenia -Continue to monitor for signs or symptoms of bleeding; no overt bleeding noted -Repeat CBC in the AM   Hypoalbuminemia -Continue to Monitor and Trend and repeat CMP in the AM -Patient is eating about fairly and has some mild nausea which she can self manage at home - He is an exceedingly poor candidate for TPN or parenteral nutrition given his life-limiting illness   GERD/GI Prophylaxis -C/w PPI with Pantoprazole 40 mg po BID   Discharge Exam: Vitals:   01/10/23 1917 01/11/23 0604  BP: (!) 128/91 120/80  Pulse: 67 (!) 57  Resp: 20 18  Temp: 98.5 F (36.9 C) 97.9 F (36.6 C)  SpO2: 100% 96%    Subj on day of d/c   Awake alert coherent sitting up at the bedside smiling Has mild nausea but is able to manage has been able to keep down 3 meals as per our discussion yesterday He is not vomiting The swelling in his leg was reviewed this morning I took off the stocking-it does not seem to be terrible and it is actually improving   General Exam on discharge  EOMI NCAT no focal deficit no icterus no pallor Chest clear no added sound no rales no rhonchi Abdomen soft no rebound no guarding Lower extremities are slightly swollen he is wearing a stocking S1-S2 no  murmur no rub no gallop Neuro is grossly intact  Discharge Instructions   Discharge Instructions     Diet - low sodium heart healthy   Complete by: As directed    Discharge instructions   Complete by: As directed    Please follow-up with Dr. Leroy [name] sherrill with regards to your cancer treatment-as we discussed with you you have a poor prognosis and you need to really discuss your options in detail with Dr. Benay [name] I would recommend that you continue your pain regimen and continue to eat well I would also recommend that you continue wearing your stockings You completed a course of doxycycline in this hospital and do not need further antibiotics.  Lower extremity redness as we felt that this was secondary to your chemotherapy--- this is why you have been placed on the Benadryl so please continue this, you have also been placed on prednisone for 7 days and you should continue this to help stimulate appetite and then stop it after 7 days or as per Dr. Benay [name] I would recommend that you take Lasix every other day and then stop when the dosage has been stopped or as per Dr. Benay [name] who will see you I would also recommend that you  follow-up closely for labs in the next week at oncology office   Increase activity slowly   Complete by: As directed       Allergies as of 01/11/2023       Reactions   Pseudoephedrine Other (See Comments)   Increases ocular pressure   Lupron [leuprolide] Itching, Other (See Comments)   Caused infection on hip         Medication List     STOP taking these medications    COLACE PO   Gemtesa 75 MG Tabs Generic drug: Vibegron   nystatin 100000 UNIT/ML suspension Commonly known as: MYCOSTATIN   potassium chloride SA 20 MEQ tablet Commonly known as: KLOR-CON M   TUMS ULTRA 1000 PO       TAKE these medications    acetaminophen 325 MG tablet Commonly known as: TYLENOL Take 325 mg by mouth daily as needed for headache.   Ayr 0.65 % nasal  spray Generic drug: sodium chloride Place 2-3 sprays into the nose as needed for congestion.   brimonidine 0.15 % ophthalmic solution Commonly known as: ALPHAGAN Place 1 drop into the left eye in the morning and at bedtime.   cholecalciferol 25 MCG (1000 UNIT) tablet Commonly known as: VITAMIN D3 Take 1,000 Units by mouth daily.   diphenhydrAMINE 25 mg capsule Commonly known as: BENADRYL Take 1 capsule (25 mg total) by mouth every 6 (six) hours as needed (left leg erythema).   Dorzolamide HCl-Timolol Mal PF 2-0.5 % Soln Place 1 drop into the left eye 2 (two) times daily.   fentaNYL 50 MCG/HR Commonly known as: [place] 1 patch onto the skin every 3 (three) days.   furosemide 20 MG tablet Commonly known as: LASIX Take 1 tablet (20 mg total) by mouth every other day for 20 days. What changed: when to take this   HYDROmorphone 4 MG tablet Commonly known as: Dilaudid Take 1-2 tablets (4-8 mg total) by mouth every 4 (four) hours as needed for severe pain.   lidocaine-prilocaine cream Commonly known as: EMLA Apply 1 Application topically as needed.   MIRALAX PO Take 17 g by mouth daily as needed (constipation).   olopatadine 0.1 % ophthalmic solution Commonly known as: PATANOL Place 1 drop into both eyes daily as needed for allergies.   ondansetron 8 MG tablet Commonly known as: ZOFRAN Take 1 tablet (8 mg total) by mouth every 8 (eight) hours as needed for nausea or vomiting (Starting day 3 after chemo as needed for nausea(received Aloxi with chemo)).   pantoprazole 40 MG tablet Commonly known as: PROTONIX Take 1 tablet (40 mg total) by mouth 2 (two) times daily.   predniSONE 10 MG tablet Commonly known as: DELTASONE Take 1 tablet (10 mg total) by mouth daily with breakfast. Start taking on: January 12, 2023   prochlorperazine 10 MG tablet Commonly known as: COMPAZINE Take 1 tablet (10 mg total) by mouth every 6 (six) hours as needed for nausea or vomiting.    Refresh Optive PF 0.5-0.9 % Soln Generic drug: Carboxymethylcell-Glycerin PF Place 1 drop into both eyes as needed (dry eyes).   tiZANidine 4 MG tablet Commonly known as: ZANAFLEX Take 1 tablet (4 mg total) by mouth every 8 (eight) hours as needed for muscle spasms.   triptorelin 11.25 MG injection Commonly known as: TRELESTAR LA Inject 11.25 mg into the muscle as needed (PSA).       Allergies  Allergen Reactions   Pseudoephedrine Other (See Comments)    Increases ocular  pressure   Lupron [Leuprolide] Itching and Other (See Comments)    Caused infection on hip       The results of significant diagnostics from this hospitalization (including imaging, microbiology, ancillary and laboratory) are listed below for reference.    Significant Diagnostic Studies: US Venous Img Lower Unilateral Left  Result Date: 01/07/2023 CLINICAL DATA:  leg swelling, access for blood clot EXAM: LEFT LOWER EXTREMITY VENOUS DOPPLER ULTRASOUND TECHNIQUE: Gray-scale sonography with compression, as well as color and duplex ultrasound, were performed to evaluate the deep venous system(s) from the level of the common femoral vein through the popliteal and proximal calf veins. COMPARISON:  None Available. FINDINGS: VENOUS Normal compressibility of the common femoral, superficial femoral, and popliteal veins, as well as the visualized calf veins. Visualized portions of profunda femoral vein and great saphenous vein unremarkable. No filling defects to suggest DVT on grayscale or color Doppler imaging. Doppler waveforms show normal direction of venous flow, normal respiratory plasticity and response to augmentation. Limited views of the contralateral common femoral vein are unremarkable. OTHER Incidentally noted left inguinal lymph node measuring 2.5 by 1.1 cm. IMPRESSION: No evidence of DVT in the left lower extremity. Electronically Signed   By: Margaretha Sheffield M.D.   On: 01/07/2023 19:02   DG Chest Port 1  View  Result Date: 01/07/2023 CLINICAL DATA:  Questionable sepsis EXAM: PORTABLE CHEST 1 VIEW COMPARISON:  Chest x-ray dated October 02, 2022 FINDINGS: Cardiac and mediastinal contours are unchanged. Left chest wall port is unchanged in position. Lungs are clear. No pleural effusion or pneumothorax. IMPRESSION: No active disease. Electronically Signed   By: Yetta Glassman M.D.   On: 01/07/2023 18:41   VAS [place] LOWER EXTREMITY VENOUS (DVT)  Result Date: 12/25/2022  Lower Venous DVT Study Patient Name:  Jared [name]  Date of Exam:   12/25/2022 Medical Rec #: [id number]      Accession #:    [id number] Date of Birth: May 31, 1946      Patient Gender: M Patient Age:   13 years Exam Location:  [facility] Procedure:      VAS [place] LOWER EXTREMITY VENOUS (DVT) Referring Phys: Dorris Fetch BYERLY --------------------------------------------------------------------------------  Indications: Swelling.  Risk Factors: None identified. Limitations: Poor ultrasound/tissue interface. Comparison Study: No prior studies. Performing Technologist: Oliver Hum RVT  Examination Guidelines: A complete evaluation includes B-mode imaging, spectral Doppler, color Doppler, and power Doppler as needed of all accessible portions of each vessel. Bilateral testing is considered an integral part of a complete examination. Limited examinations for reoccurring indications may be performed as noted. The reflux portion of the exam is performed with the patient in reverse Trendelenburg.  +---------+---------------+---------+-----------+----------+--------------+ RIGHT    CompressibilityPhasicitySpontaneityPropertiesThrombus Aging +---------+---------------+---------+-----------+----------+--------------+ CFV      Full           Yes      Yes                                 +---------+---------------+---------+-----------+----------+--------------+ SFJ      Full                                                         +---------+---------------+---------+-----------+----------+--------------+ FV Prox  Full                                                        +---------+---------------+---------+-----------+----------+--------------+  FV Mid   Full                                                        +---------+---------------+---------+-----------+----------+--------------+ FV DistalFull                                                        +---------+---------------+---------+-----------+----------+--------------+ PFV      Full                                                        +---------+---------------+---------+-----------+----------+--------------+ POP      Full           Yes      Yes                                 +---------+---------------+---------+-----------+----------+--------------+ PTV      Full                                                        +---------+---------------+---------+-----------+----------+--------------+ PERO     Full                                                        +---------+---------------+---------+-----------+----------+--------------+   +---------+---------------+---------+-----------+----------+--------------+ LEFT     CompressibilityPhasicitySpontaneityPropertiesThrombus Aging +---------+---------------+---------+-----------+----------+--------------+ CFV      Full           Yes      Yes                                 +---------+---------------+---------+-----------+----------+--------------+ SFJ      Full                                                        +---------+---------------+---------+-----------+----------+--------------+ FV Prox  Full                                                        +---------+---------------+---------+-----------+----------+--------------+ FV Mid   Full                                                         +---------+---------------+---------+-----------+----------+--------------+  FV DistalFull                                                        +---------+---------------+---------+-----------+----------+--------------+ PFV      Full                                                        +---------+---------------+---------+-----------+----------+--------------+ POP      Full           Yes      Yes                                 +---------+---------------+---------+-----------+----------+--------------+ PTV      Full                                                        +---------+---------------+---------+-----------+----------+--------------+ PERO     Full                                                        +---------+---------------+---------+-----------+----------+--------------+     Summary: RIGHT: - There is no evidence of deep vein thrombosis in the lower extremity.  - No cystic structure found in the popliteal fossa.  LEFT: - There is no evidence of deep vein thrombosis in the lower extremity.  - No cystic structure found in the popliteal fossa.  *See table(s) above for measurements and observations. Electronically signed by Deitra Mayo MD on 12/25/2022 at 2:51:40 PM.    Final    CT CHEST ABDOMEN PELVIS W CONTRAST  Result Date: 12/12/2022 CLINICAL DATA:  Assess pancreatic cancer for staging purposes. * Tracking Code: BO * EXAM: CT CHEST, ABDOMEN, AND PELVIS WITH CONTRAST TECHNIQUE: Multidetector CT imaging of the chest, abdomen and pelvis was performed following the standard protocol during bolus administration of intravenous contrast. RADIATION DOSE REDUCTION: This exam was performed according to the departmental dose-optimization program which includes automated exposure control, adjustment of the mA and/or kV according to patient size and/or use of iterative reconstruction technique. CONTRAST:  59mL OMNIPAQUE IOHEXOL 300 MG/ML  SOLN COMPARISON:  Multiple  prior studies most recent comparison from December of 2023 FINDINGS: CT CHEST FINDINGS Cardiovascular: Normal caliber of the thoracic aorta. Normal caliber of central pulmonary vessels. LEFT-sided Port-A-Cath terminates at the lower portion of the superior vena cava. Mediastinum/Nodes: No retrocrural adenopathy, mediastinal or hilar lymphadenopathy. No thoracic inlet or axillary lymphadenopathy. Lungs/Pleura: No sign of consolidation or evidence of pleural effusion. Small RIGHT middle lobe nodule present since 2016 and only minimally enlarged now measuring 4 mm previously 3 mm in 2016. Indistinct RIGHT lower lobe nodule (image 78/4) 5 mm. Airways are patent. Musculoskeletal: See below for full musculoskeletal details. CT ABDOMEN PELVIS FINDINGS Hepatobiliary: No focal, suspicious hepatic lesion. Portal vein is patent.  Post cholecystectomy without signs of biliary duct distension. Pancreas: Pancreas mass measuring 4.5 x 3.2 cm previously approximately 4.0 x 2.5 cm. Increasing ductal dilation of the peripheral pancreatic duct. LEFT adrenal gland and proximity to the pancreatic lesion, see below. Ill-defined soft tissue density in the lesser sac adjacent to the stomach anterior to the pancreatic neck (image 62/2) 3.7 x 2.6 cm. Study not obtained as a "pancreatic protocol" CT but the splenic portal confluence involvement seen on previous imaging appears more apparent on the current study. Splenic artery also likely with involvement and stranding extending to the celiac and SMA. Soft tissue extends to the ligament of Treitz. Spleen: Normal. Adrenals/Urinary Tract: LEFT adrenal lesion enlarging 2.6 cm greatest axial dimension previously 2.1 cm with lower density centrally than on previous imaging from December. RIGHT adrenal gland is normal. Stomach/Bowel: No signs of bowel obstruction or acute bowel process. Appendix not visualized but no secondary signs to suggest acute appendicitis. No signs of colonic obstruction or  definitive signs of colonic inflammation. Area of low attenuation along the anti mesenteric margin of the sigmoid (image 105/2) 2.3 x 1.7 cm. 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 No substantial surrounding stranding is noted however. Vascular/Lymphatic: No adenopathy in the retroperitoneum. No mesenteric adenopathy or pelvic lymphadenopathy. Reproductive: Post prostatectomy. Other: Small areas of nodularity in the pelvis adjacent to the lesion about the sigmoid discussed above (image 107/2) 7 mm nodule for reference. Nodule along the under surface of the RIGHT hemiliver (image 56/2) 11 mm. Another nodular area just superior to the hepatic flexure of the colon best seen on coronal image 43/5. This is present in more since pouch also measuring approximately 11 mm. Scattered areas of nodularity in the omentum, for instance on image 81 of series 2, 6 mm nodule in the omentum in the anterior abdomen. No ascites. No pneumoperitoneum. Musculoskeletal: Spinal degenerative changes. No destructive bone finding or acute bone process. IMPRESSION: 1. Increasing size of the pancreatic mass with suspected metastatic disease involving the lesser sac and adjacent stomach. New area of masslike nodularity anterior to the pancreatic neck appears to tether the gastric antrum. 2. Worsening of peritoneal disease along the inferior surface of the RIGHT hepatic margin. 3. Lesion along the margin of the sigmoid colon is favored to represent a tumor implant in this patient with known peritoneal disease. Phlegmon in the setting of diverticular changes is another differential consideration. Correlate with any current signs of abdominal pain. Would favor the possibility peritoneal spread of tumor over acute process at this time. 4. LEFT adrenal gland in close proximity to the pancreatic lesion, enlarging with lower density centrally than on previous imaging from December. 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 5. Scattered more conspicuous areas of peritoneal and omental involvement. 6. Vascular involvement seen on previous imaging most notably of the splenic vein and splenic artery. These results will be called to the ordering clinician or representative by the Radiologist Assistant, and communication documented in the PACS or [employer]. Electronically Signed   By: Zetta Bills M.D.   On: 12/12/2022 16:08    Microbiology: Recent Results (from the past 240 hour(s))  Blood Culture (routine x 2)     Status: None (Preliminary result)   Collection Time: 01/07/23  5:50 PM   Specimen: BLOOD  Result Value Ref Range Status   Specimen Description   Final    BLOOD RIGHT ANTECUBITAL Performed at  [facility], [address] [place], [place] [address]    Special Requests   Final    Blood Culture adequate volume BOTTLES DRAWN AEROBIC AND ANAEROBIC Performed at [facility], [address], [place], [place] [address]    Culture   Final    NO GROWTH 3 DAYS Performed at [facility], [place] [address], [place], [place] [address]    Report Status PENDING  Incomplete  Resp panel by RT-PCR (RSV, Flu A&B, Covid) Anterior Nasal Swab     Status: None   Collection Time: 01/07/23  6:00 PM   Specimen: Anterior Nasal Swab  Result Value Ref Range Status   SARS Coronavirus 2 by RT PCR NEGATIVE NEGATIVE Final    Comment: (NOTE) SARS-CoV-2 target nucleic acids are NOT DETECTED.  The SARS-CoV-2 RNA is generally detectable in upper respiratory specimens during the acute phase of infection. The lowest concentration of SARS-CoV-2 viral copies this assay can detect is 138 copies/mL. A negative result does not preclude SARS-Cov-2 infection and should not be used as the sole basis for treatment or other patient management decisions. A negative result may occur with  improper  specimen collection/handling, submission of specimen other than nasopharyngeal swab, presence of viral mutation(s) within the areas targeted by this assay, and inadequate number of viral copies(<138 copies/mL). A negative result must be combined with clinical observations, patient history, and epidemiological information. The expected result is Negative.  Fact Sheet for Patients:  EntrepreneurPulse.com.au  Fact Sheet for Healthcare Providers:  IncredibleEmployment.be  This test is no t yet approved or cleared by the [place] FDA and  has been authorized for detection and/or diagnosis of SARS-CoV-2 by FDA under an Emergency Use Authorization (EUA). This EUA will remain  in effect (meaning this test can be used) for the duration of the COVID-19 declaration under Section 564(b)(1) of the Act, 21 U.S.C.section 360bbb-3(b)(1), unless the authorization is terminated  or revoked sooner.       Influenza A by PCR NEGATIVE NEGATIVE Final   Influenza B by PCR NEGATIVE NEGATIVE Final    Comment: (NOTE) The Xpert Xpress SARS-CoV-2/FLU/RSV plus assay is intended as an aid in the diagnosis of influenza from Nasopharyngeal swab specimens and should not be used as a sole basis for treatment. Nasal washings and aspirates are unacceptable for Xpert Xpress SARS-CoV-2/FLU/RSV testing.  Fact Sheet for Patients: EntrepreneurPulse.com.au  Fact Sheet for Healthcare Providers: IncredibleEmployment.be  This test is not yet approved or cleared by the [place] FDA and has been authorized for detection and/or diagnosis of SARS-CoV-2 by FDA under an Emergency Use Authorization (EUA). This EUA will remain in effect (meaning this test can be used) for the duration of the COVID-19 declaration under Section 564(b)(1) of the Act, 21 U.S.C. section 360bbb-3(b)(1), unless the authorization is terminated or revoked.     Resp  Syncytial Virus by PCR NEGATIVE NEGATIVE Final    Comment: (NOTE) Fact Sheet for Patients: EntrepreneurPulse.com.au  Fact Sheet for Healthcare Providers: IncredibleEmployment.be  This test is not yet approved or cleared by the [place] FDA and has been authorized for detection and/or diagnosis of SARS-CoV-2 by FDA under an Emergency Use Authorization (EUA). This EUA will remain in effect (meaning this test can be used) for the duration of the COVID-19 declaration under Section 564(b)(1) of the Act, 21 U.S.C. section 360bbb-3(b)(1), unless the authorization is terminated or revoked.  Performed at [facility], [address] [place], [place] [address]   Blood Culture (  routine x 2)     Status: None (Preliminary result)   Collection Time: 01/07/23  6:05 PM   Specimen: BLOOD  Result Value Ref Range Status   Specimen Description   Final    BLOOD LEFT ANTECUBITAL Performed at [facility], [address] [place], [place] [address]    Special Requests   Final    Blood Culture adequate volume BOTTLES DRAWN AEROBIC AND ANAEROBIC Performed at [facility], [address], [place], [place] [address]    Culture   Final    NO GROWTH 3 DAYS Performed at [facility], [place] [address] [place], [place] [address]    Report Status PENDING  Incomplete     Labs: Basic Metabolic Panel: Recent Labs  Lab 01/07/23 1815 01/08/23 0644 01/09/23 0439 01/10/23 0535  NA 133* 132* 132* 133*  K 4.1 3.9 3.8 3.5  CL 99 99 98 99  CO2 28 26 26 26   GLUCOSE 151* 134* 104* 102*  BUN 8 7* 6* 5*  CREATININE 0.49* 0.54* 0.54* 0.55*  CALCIUM 8.7* 8.0* 8.3* 8.3*  MG  --  1.8 2.4  --   PHOS  --   --  2.7  --    Liver Function Tests: Recent Labs  Lab 01/07/23 1815 01/08/23 0644 01/09/23 0439 01/10/23 0535  AST 20 22 22 21   ALT 9 12 13 13   ALKPHOS 54 52 63 58  BILITOT 0.6 0.7 0.7 0.7  PROT  5.6* 5.1* 5.9* 5.5*  ALBUMIN 3.4* 2.8* 3.0* 2.8*   No results for input(s): "LIPASE", "AMYLASE" in the last 168 hours. No results for input(s): "AMMONIA" in the last 168 hours. CBC: Recent Labs  Lab 01/07/23 1815 01/08/23 0644 01/09/23 0439 01/10/23 0535  WBC 6.7 6.4 4.7 3.5*  NEUTROABS 6.1 5.6 3.7 2.1  HGB 10.5* 10.0* 10.8* 10.0*  HCT 31.7* 30.4* 32.4* 30.7*  MCV 85.4 86.9 85.9 86.2  PLT 123* 123* 143* 127*   Cardiac Enzymes: No results for input(s): "CKTOTAL", "CKMB", "CKMBINDEX", "TROPONINI" in the last 168 hours. BNP: BNP (last 3 results) Recent Labs    01/10/23 0535  BNP 155.6*    ProBNP (last 3 results) No results for input(s): "PROBNP" in the last 8760 hours.  CBG: No results for input(s): "GLUCAP" in the last 168 hours.     Signed:  Nita Sells MD   [facility] 01/11/2023, 9:25 AM

## 2023-01-12 ENCOUNTER — Other Ambulatory Visit: Payer: Self-pay | Admitting: Oncology

## 2023-01-12 DIAGNOSIS — L03116 Cellulitis of left lower limb: Secondary | ICD-10-CM | POA: Diagnosis not present

## 2023-01-12 LAB — BASIC METABOLIC PANEL
Anion gap: 9 (ref 5–15)
BUN: 7 mg/dL — ABNORMAL LOW (ref 8–23)
CO2: 24 mmol/L (ref 22–32)
Calcium: 8.8 mg/dL — ABNORMAL LOW (ref 8.9–10.3)
Chloride: 95 mmol/L — ABNORMAL LOW (ref 98–111)
Creatinine, Ser: 0.63 mg/dL (ref 0.61–1.24)
GFR, Estimated: >60 mL/min (ref >60)
Glucose, Bld: 128 mg/dL — ABNORMAL HIGH (ref 70–99)
Potassium: 3.1 mmol/L — ABNORMAL LOW (ref 3.5–5.1)
Sodium: 128 mmol/L — ABNORMAL LOW (ref 135–145)

## 2023-01-12 LAB — CULTURE, BLOOD (ROUTINE X 2)
Culture: NO GROWTH
Culture: NO GROWTH
Special Requests: ADEQUATE
Special Requests: ADEQUATE

## 2023-01-12 MED ORDER — POTASSIUM CHLORIDE 20 MEQ PO PACK
40.0000 meq | PACK | Freq: Two times a day (BID) | ORAL | Status: DC
  Administered 2023-01-12 – 2023-01-13 (×3): 40 meq via ORAL
  Filled 2023-01-12 (×3): qty 2

## 2023-01-12 MED ORDER — ALUM & MAG HYDROXIDE-SIMETH 200-200-20 MG/5ML PO SUSP
30.0000 mL | ORAL | Status: DC | PRN
  Administered 2023-01-12: 30 mL via ORAL
  Filled 2023-01-12: qty 30

## 2023-01-12 MED ORDER — PROCHLORPERAZINE EDISYLATE 10 MG/2ML IJ SOLN
5.0000 mg | Freq: Once | INTRAMUSCULAR | Status: AC
  Administered 2023-01-12: 5 mg via INTRAVENOUS
  Filled 2023-01-12: qty 2

## 2023-01-12 NOTE — Progress Notes (Signed)
Patient vomiting, unable to keep any thing down, spoke with A. Zebedee Iba about getting order for another antemetic, order was placed for Compazine. Compazine given along with prn dose Iv Dilaudid both had positive effect.

## 2023-01-12 NOTE — Progress Notes (Signed)
PROGRESS NOTE    Jared [name]  [id number] DOB: 06/03/1946 DOA: 01/07/2023 PCP: Jared Covert, MD   Brief Narrative:  77 year old pancreatic cancer on chemotherapy, GERD, anemia of chronic disease with a baseline hemoglobin of 10-12 as well as other comorbidities who presents with worsening left lower extremity erythema and warmth.   He has had 2 to 3 days of progressive left lower extremity erythema, associated tenderness, increased warmth and swelling and no drainage noted.   He has had subjective fevers over that timeframe but denies any chills, full body rigors or generalized myalgias.     Recently saw oncology for chemotherapy.  In the ED his Tmax was 100.1.  Labs were notable for hyponatremia and blood cultures x 2 were obtained.  Further workup reveals left lower extremity swelling  Hospitalization complicated by anorexia, nausea as well as by abdominal pain Decision made to consult palliative medicine--family coming to terms with terminal nature of disease process   Assessment and Plan: No notes have been filed under this hospital service. Service: Hospitalist  Unlikely cellulitis Side effect of last chemo - 3-day worsening and progressive left lower extremity erythema associated tenderness, increased warmth to touch as well as swelling, temporally related to chemo -Left ultrasound done showed no evidence of DVT -Ceftrixaone to doxycycline 100 bid, completed duration abx - swelling and issues have have been happening since February and may be secondary to hypoproteinemia/chemo given the albumin is 3.0 -BNP only slightly elevated 155, Lasix 20 d/c 3/16 , continue stockings to LE's as able  Hyponatremia/hypokalemia -worsening 2/2 liquid> solute in diet -fluid restrict 1800 cc, give fluids with solute [ensure etc]--ensure EATING before plan for d/c -replace k this am with oral  Pancreatic Cancer -Has been on chemotherapy and follows with Jared Card, NP as well as  Jared [name] -Continue 8 mg every 4 as needed oral Dilaudid, discontinue IV Dilaudid breakthrough -Continue with fentanyl patch -Patient and wife demonstrate good understanding of life limiting nature of disease--express interest in Palliative care and Hospice--appreciative of Palliative medicine expertise  Oral thrush -Continue with nystatin 500,000 units p.o. 4 times daily, resolved  Anemia of Chronic Disease/Normocytic Anemia -Continue to monitor for signs and symptoms of bleeding; no overt bleeding noted -Repeat CBC in a.m.  Glaucoma -Continue with brimonidine 0.15% atomic solution 1 drop in left eye twice daily as well as dorzolamide-timolol 1 drop in both eyes twice daily  Thrombocytopenia -2/2 cancer.  Hypoalbuminemia -Continue to Monitor and Trend and repeat CMP in the AM -Patient is eating a little better.  Monitor trends--do not expect to have great appetite--prednisone 10 added per Oncologist - exceedingly poor candidate for TPN or parenteral nutrition given his life-limiting illness  GERD/GI Prophylaxis -C/w PPI with Pantoprazole 40 mg po BID   DVT prophylaxis: Place TED hose Start: 01/09/23 1635 enoxaparin (LOVENOX) injection 40 mg Start: 01/08/23 0800    Code Status: Full Code Family Communication: No family currently at bedside  Disposition Plan:  Level of care: Med-Surg Status is: Inpatient Remains inpatient appropriate because: Needs further clinical improvement prior to safe discharge disposition   Procedures:  As delineated as above and had a lower extremity duplex that was negative for DVT but did show incidentally noted left inguinal lymph node measuring 2.5 by 1.1 cm   Antimicrobials:  Anti-infectives (From admission, onward)    Start     Dose/Rate Route Frequency Ordered Stop   01/09/23 2200  doxycycline (VIBRA-TABS) tablet 100 mg  100 mg Oral Every 12 hours 01/09/23 1628     01/08/23 2000  cefTRIAXone (ROCEPHIN) 1 g in sodium chloride 0.9 %  100 mL IVPB  Status:  Discontinued        1 g 200 mL/hr over 30 Minutes Intravenous Every 24 hours 01/07/23 2358 01/09/23 1628   01/07/23 1945  cefTRIAXone (ROCEPHIN) 1 g in sodium chloride 0.9 % 100 mL IVPB        1 g 200 mL/hr over 30 Minutes Intravenous  Once 01/07/23 1934 01/07/23 2035       Subjective:  Events overnight noted-needed some Compazine Drinking mainly--not eating.  Advised ensure with solute but also solid food if able  Objective: Vitals:   01/11/23 1045 01/11/23 2002 01/12/23 0500 01/12/23 0517  BP: (!) 142/85 (!) 140/91  118/73  Pulse: (!) 51 (!) 56  (!) 56  Resp:  16  18  Temp: 98.1 F (36.7 C) 98.7 F (37.1 C)  98.2 F (36.8 C)  TempSrc: Oral Oral  Oral  SpO2: 99% 100%  99%  Weight:   95.2 kg   Height:        Intake/Output Summary (Last 24 hours) at 01/12/2023 1255 Last data filed at 01/12/2023 1021 Gross per 24 hour  Intake 240 ml  Output --  Net 240 ml    Filed Weights   01/10/23 0500 01/11/23 0500 01/12/23 0500  Weight: 95 kg 95.2 kg 95.2 kg   Examination: Physical Exam:  Ill-appearing black male looks overall more comfrotablke Chest clear no rales rhonchi S1-S2 no murmur no rub no gallop Abdomen soft --no rebound Bilateral lower extremity edema Neuro intact  Data Reviewed: I have personally reviewed following labs and imaging studies  CBC: Recent Labs  Lab 01/07/23 1815 01/08/23 0644 01/09/23 0439 01/10/23 0535  WBC 6.7 6.4 4.7 3.5*  NEUTROABS 6.1 5.6 3.7 2.1  HGB 10.5* 10.0* 10.8* 10.0*  HCT 31.7* 30.4* 32.4* 30.7*  MCV 85.4 86.9 85.9 86.2  PLT 123* 123* 143* 127*    Basic Metabolic Panel: Recent Labs  Lab 01/07/23 1815 01/08/23 0644 01/09/23 0439 01/10/23 0535 01/12/23 0920  NA 133* 132* 132* 133* 128*  K 4.1 3.9 3.8 3.5 3.1*  CL 99 99 98 99 95*  CO2 28 26 26 26 24   GLUCOSE 151* 134* 104* 102* 128*  BUN 8 7* 6* 5* 7*  CREATININE 0.49* 0.54* 0.54* 0.55* 0.63  CALCIUM 8.7* 8.0* 8.3* 8.3* 8.8*  MG  --  1.8 2.4   --   --   PHOS  --   --  2.7  --   --     GFR: Estimated Creatinine Clearance: 92.5 mL/min (by C-G formula based on SCr of 0.63 mg/dL). Liver Function Tests: Recent Labs  Lab 01/07/23 1815 01/08/23 0644 01/09/23 0439 01/10/23 0535  AST 20 22 22 21   ALT 9 12 13 13   ALKPHOS 54 52 63 58  BILITOT 0.6 0.7 0.7 0.7  PROT 5.6* 5.1* 5.9* 5.5*  ALBUMIN 3.4* 2.8* 3.0* 2.8*    No results for input(s): "LIPASE", "AMYLASE" in the last 168 hours. No results for input(s): "AMMONIA" in the last 168 hours. Coagulation Profile: Recent Labs  Lab 01/07/23 1815  INR 1.1    Cardiac Enzymes: No results for input(s): "CKTOTAL", "CKMB", "CKMBINDEX", "TROPONINI" in the last 168 hours. BNP (last 3 results) No results for input(s): "PROBNP" in the last 8760 hours. HbA1C: No results for input(s): "HGBA1C" in the last 72 hours. CBG: No results  for input(s): "GLUCAP" in the last 168 hours. Lipid Profile: No results for input(s): "CHOL", "HDL", "LDLCALC", "TRIG", "CHOLHDL", "LDLDIRECT" in the last 72 hours. Thyroid Function Tests: No results for input(s): "TSH", "T4TOTAL", "FREET4", "T3FREE", "THYROIDAB" in the last 72 hours. Anemia Panel: No results for input(s): "VITAMINB12", "FOLATE", "FERRITIN", "TIBC", "IRON", "RETICCTPCT" in the last 72 hours. Sepsis Labs: Recent Labs  Lab 01/07/23 1721  LATICACIDVEN 1.3    Recent Results (from the past 240 hour(s))  Blood Culture (routine x 2)     Status: None   Collection Time: 01/07/23  5:50 PM   Specimen: BLOOD  Result Value Ref Range Status   Specimen Description   Final    BLOOD RIGHT ANTECUBITAL Performed at [facility], [address], [place], [place] [address]    Special Requests   Final    Blood Culture adequate volume BOTTLES DRAWN AEROBIC AND ANAEROBIC Performed at [facility], [address], [place], [place] [address]    Culture   Final    NO GROWTH 5 DAYS Performed at [facility], [place] [address] [place], [place] [address]    Report Status 01/12/2023 FINAL  Final  Resp panel by RT-PCR (RSV, Flu A&B, Covid) Anterior Nasal Swab     Status: None   Collection Time: 01/07/23  6:00 PM   Specimen: Anterior Nasal Swab  Result Value Ref Range Status   SARS Coronavirus 2 by RT PCR NEGATIVE NEGATIVE Final    Comment: (NOTE) SARS-CoV-2 target nucleic acids are NOT DETECTED.  The SARS-CoV-2 RNA is generally detectable in upper respiratory specimens during the acute phase of infection. The lowest concentration of SARS-CoV-2 viral copies this assay can detect is 138 copies/mL. A negative result does not preclude SARS-Cov-2 infection and should not be used as the sole basis for treatment or other patient management decisions. A negative result may occur with  improper specimen collection/handling, submission of specimen other than nasopharyngeal swab, presence of viral mutation(s) within the areas targeted by this assay, and inadequate number of viral copies(<138 copies/mL). A negative result must be combined with clinical observations, patient history, and epidemiological information. The expected result is Negative.  Fact Sheet for Patients:  EntrepreneurPulse.com.au  Fact Sheet for Healthcare Providers:  IncredibleEmployment.be  This test is no t yet approved or cleared by the [place] FDA and  has been authorized for detection and/or diagnosis of SARS-CoV-2 by FDA under an Emergency Use Authorization (EUA). This EUA will remain  in effect (meaning this test can be used) for the duration of the COVID-19 declaration under Section 564(b)(1) of the Act, 21 U.S.C.section 360bbb-3(b)(1), unless the authorization is terminated  or revoked sooner.       Influenza A by PCR NEGATIVE NEGATIVE Final   Influenza B by PCR NEGATIVE NEGATIVE Final    Comment: (NOTE) The Xpert Xpress SARS-CoV-2/FLU/RSV plus assay is intended as an aid in  the diagnosis of influenza from Nasopharyngeal swab specimens and should not be used as a sole basis for treatment. Nasal washings and aspirates are unacceptable for Xpert Xpress SARS-CoV-2/FLU/RSV testing.  Fact Sheet for Patients: EntrepreneurPulse.com.au  Fact Sheet for Healthcare Providers: IncredibleEmployment.be  This test is not yet approved or cleared by the [place] FDA and has been authorized for detection and/or diagnosis of SARS-CoV-2 by FDA under an Emergency Use Authorization (EUA). This EUA will remain in effect (meaning this test can be used) for the duration of the COVID-19 declaration under Section 564(b)(1) of the  Act, 21 U.S.C. section 360bbb-3(b)(1), unless the authorization is terminated or revoked.     Resp Syncytial Virus by PCR NEGATIVE NEGATIVE Final    Comment: (NOTE) Fact Sheet for Patients: EntrepreneurPulse.com.au  Fact Sheet for Healthcare Providers: IncredibleEmployment.be  This test is not yet approved or cleared by the [place] FDA and has been authorized for detection and/or diagnosis of SARS-CoV-2 by FDA under an Emergency Use Authorization (EUA). This EUA will remain in effect (meaning this test can be used) for the duration of the COVID-19 declaration under Section 564(b)(1) of the Act, 21 U.S.C. section 360bbb-3(b)(1), unless the authorization is terminated or revoked.  Performed at [facility], [address], [place], [place] [address]   Blood Culture (routine x 2)     Status: None   Collection Time: 01/07/23  6:05 PM   Specimen: BLOOD  Result Value Ref Range Status   Specimen Description   Final    BLOOD LEFT ANTECUBITAL Performed at [facility], [address], [place], [place] [address]    Special Requests   Final    Blood Culture adequate volume BOTTLES DRAWN AEROBIC AND ANAEROBIC Performed at [facility], [address], [place], [place] [address]    Culture   Final    NO GROWTH 5 DAYS Performed at [facility], [place] [address] [place], [place] [address]    Report Status 01/12/2023 FINAL  Final    Radiology Studies: No results found.  Scheduled Meds:  brimonidine  1 drop Left Eye BID   Chlorhexidine Gluconate Cloth  6 each Topical Daily   dorzolamide-timolol  1 drop Both Eyes BID   enoxaparin (LOVENOX) injection  40 mg Subcutaneous Q24H   fentaNYL  1 patch Transdermal Q72H   nystatin  5 mL Oral QID   pantoprazole  40 mg Oral BID   potassium chloride  40 mEq Oral BID   predniSONE  10 mg Oral Q breakfast   Continuous Infusions:    LOS: 5 days   Verneita Griffes, MD [facility] 12:55 PM

## 2023-01-13 LAB — BASIC METABOLIC PANEL
Anion gap: 8 (ref 5–15)
BUN: 9 mg/dL (ref 8–23)
CO2: 27 mmol/L (ref 22–32)
Calcium: 8.6 mg/dL — ABNORMAL LOW (ref 8.9–10.3)
Chloride: 98 mmol/L (ref 98–111)
Creatinine, Ser: 0.57 mg/dL — ABNORMAL LOW (ref 0.61–1.24)
GFR, Estimated: >60 mL/min (ref >60)
Glucose, Bld: 137 mg/dL — ABNORMAL HIGH (ref 70–99)
Potassium: 3.7 mmol/L (ref 3.5–5.1)
Sodium: 133 mmol/L — ABNORMAL LOW (ref 135–145)

## 2023-01-13 MED ORDER — HEPARIN SOD (PORK) LOCK FLUSH 100 UNIT/ML IV SOLN
500.0000 [IU] | INTRAVENOUS | Status: AC | PRN
  Administered 2023-01-13: 500 [IU]
  Filled 2023-01-13: qty 5

## 2023-01-13 MED ORDER — PREDNISONE 10 MG PO TABS: 10.0000 mg | ORAL_TABLET | Freq: Every day | ORAL | 0 refills | Status: DC

## 2023-01-13 NOTE — Plan of Care (Signed)

## 2023-01-14 ENCOUNTER — Other Ambulatory Visit (HOSPITAL_COMMUNITY): Payer: Self-pay

## 2023-01-14 ENCOUNTER — Telehealth: Payer: Self-pay | Admitting: Family Medicine

## 2023-01-14 NOTE — Telephone Encounter (Signed)
Per 3/18 IB reached out to patient to schedule; patients wife aware of date and time of appointment.

## 2023-01-17 ENCOUNTER — Inpatient Hospital Stay: Payer: Medicare HMO | Admitting: Oncology

## 2023-01-17 ENCOUNTER — Inpatient Hospital Stay: Payer: Medicare HMO

## 2023-01-17 ENCOUNTER — Other Ambulatory Visit (HOSPITAL_BASED_OUTPATIENT_CLINIC_OR_DEPARTMENT_OTHER): Payer: Self-pay

## 2023-01-17 ENCOUNTER — Other Ambulatory Visit: Payer: Self-pay | Admitting: *Deleted

## 2023-01-17 ENCOUNTER — Telehealth: Payer: Self-pay

## 2023-01-17 VITALS — BP 117/81 | HR 68 | Resp 18

## 2023-01-17 VITALS — BP 134/86 | HR 75 | Temp 98.1°F | Resp 18 | Ht 72.0 in | Wt 205.1 lb

## 2023-01-17 DIAGNOSIS — D709 Neutropenia, unspecified: Secondary | ICD-10-CM | POA: Diagnosis not present

## 2023-01-17 DIAGNOSIS — C251 Malignant neoplasm of body of pancreas: Secondary | ICD-10-CM

## 2023-01-17 DIAGNOSIS — C258 Malignant neoplasm of overlapping sites of pancreas: Secondary | ICD-10-CM | POA: Diagnosis present

## 2023-01-17 DIAGNOSIS — Z95828 Presence of other vascular implants and grafts: Secondary | ICD-10-CM

## 2023-01-17 DIAGNOSIS — C786 Secondary malignant neoplasm of retroperitoneum and peritoneum: Secondary | ICD-10-CM | POA: Diagnosis not present

## 2023-01-17 DIAGNOSIS — C252 Malignant neoplasm of tail of pancreas: Secondary | ICD-10-CM

## 2023-01-17 DIAGNOSIS — Z8546 Personal history of malignant neoplasm of prostate: Secondary | ICD-10-CM | POA: Diagnosis not present

## 2023-01-17 DIAGNOSIS — Z452 Encounter for adjustment and management of vascular access device: Secondary | ICD-10-CM | POA: Diagnosis not present

## 2023-01-17 LAB — CMP (CANCER CENTER ONLY)
ALT: 27 U/L (ref 0–44)
AST: 20 U/L (ref 15–41)
Albumin: 3.8 g/dL (ref 3.5–5.0)
Alkaline Phosphatase: 72 U/L (ref 38–126)
Anion gap: 6 (ref 5–15)
BUN: 9 mg/dL (ref 8–23)
CO2: 30 mmol/L (ref 22–32)
Calcium: 9.6 mg/dL (ref 8.9–10.3)
Chloride: 99 mmol/L (ref 98–111)
Creatinine: 0.57 mg/dL — ABNORMAL LOW (ref 0.61–1.24)
GFR, Estimated: >60 mL/min (ref >60)
Glucose, Bld: 149 mg/dL — ABNORMAL HIGH (ref 70–99)
Potassium: 3.9 mmol/L (ref 3.5–5.1)
Sodium: 135 mmol/L (ref 135–145)
Total Bilirubin: 0.5 mg/dL (ref 0.3–1.2)
Total Protein: 6.2 g/dL — ABNORMAL LOW (ref 6.5–8.1)

## 2023-01-17 LAB — CBC WITH DIFFERENTIAL (CANCER CENTER ONLY)
Abs Immature Granulocytes: 0.09 10*3/uL — ABNORMAL HIGH (ref 0.00–0.07)
Basophils Absolute: 0 10*3/uL (ref 0.0–0.1)
Basophils Relative: 1 %
Eosinophils Absolute: 0.3 10*3/uL (ref 0.0–0.5)
Eosinophils Relative: 3 %
HCT: 35.9 % — ABNORMAL LOW (ref 39.0–52.0)
Hemoglobin: 12.3 g/dL — ABNORMAL LOW (ref 13.0–17.0)
Immature Granulocytes: 1 %
Lymphocytes Relative: 16 %
Lymphs Abs: 1.4 10*3/uL (ref 0.7–4.0)
MCH: 28.7 pg (ref 26.0–34.0)
MCHC: 34.3 g/dL (ref 30.0–36.0)
MCV: 83.9 fL (ref 80.0–100.0)
Monocytes Absolute: 1.4 10*3/uL — ABNORMAL HIGH (ref 0.1–1.0)
Monocytes Relative: 16 %
Neutro Abs: 5.4 10*3/uL (ref 1.7–7.7)
Neutrophils Relative %: 63 %
Platelet Count: 225 10*3/uL (ref 150–400)
RBC: 4.28 MIL/uL (ref 4.22–5.81)
RDW: 15.3 % (ref 11.5–15.5)
WBC Count: 8.5 10*3/uL (ref 4.0–10.5)
nRBC: 0 % (ref 0.0–0.2)

## 2023-01-17 MED ORDER — PREDNISONE 10 MG PO TABS: 10.0000 mg | ORAL_TABLET | Freq: Every day | ORAL | 0 refills | Status: DC

## 2023-01-17 MED ORDER — HEPARIN SOD (PORK) LOCK FLUSH 100 UNIT/ML IV SOLN
500.0000 [IU] | Freq: Once | INTRAVENOUS | Status: AC
  Administered 2023-01-17: 500 [IU] via INTRAVENOUS

## 2023-01-17 MED ORDER — SODIUM CHLORIDE 0.9% FLUSH
10.0000 mL | Freq: Once | INTRAVENOUS | Status: AC
  Administered 2023-01-17: 10 mL via INTRAVENOUS

## 2023-01-17 MED ORDER — MORPHINE SULFATE (PF) 2 MG/ML IV SOLN
6.0000 mg | Freq: Once | INTRAVENOUS | Status: AC
  Administered 2023-01-17: 6 mg via INTRAVENOUS
  Filled 2023-01-17: qty 3

## 2023-01-17 MED ORDER — FENTANYL 100 MCG/HR TD PT72: 1.0000 | MEDICATED_PATCH | TRANSDERMAL | 0 refills | Status: DC

## 2023-01-17 NOTE — Progress Notes (Signed)
Patient monitored for 30 minutes post morphine injection.  Patient stated his pain had improved (see assessment) and felt comfortable going home.  Dr. Gearldine Shown RN made aware of pain score per physician request.  VSS upon leaving infusion room.

## 2023-01-17 NOTE — Progress Notes (Signed)
[place] OFFICE PROGRESS NOTE   Diagnosis: Pancreas cancer  INTERVAL HISTORY:   Jared [name] was discharged from the hospital 01/11/2023 after admission with fever and left leg swelling/erythema.  He is symptoms were felt to most likely be related to toxicity from gemcitabine.  He has persistent abdominal pain.  This is his chief complaint today.  The pain is not adequately relieved with the current narcotic regimen.  He takes 8 mg of hydromorphone every 4 hours without adequate pain relief.  Jared [name] is here with his wife.  Objective:  Vital signs in last 24 hours:  Blood pressure 134/86, pulse 75, temperature 98.1 F (36.7 C), temperature source Oral, resp. rate 18, height 6' (1.829 m), weight 205 lb 1.6 oz (93 kg), SpO2 100 %.   Resp: Lungs clear bilaterally Cardio: Regular rate and rhythm GI: Abdominal exam declined by patient Vascular: Trace lower leg edema bilaterally, faint erythema at the left lower medial thigh    Portacath/PICC-without erythema  Lab Results:  Lab Results  Component Value Date   WBC 8.5 01/17/2023   HGB 12.3 (L) 01/17/2023   HCT 35.9 (L) 01/17/2023   MCV 83.9 01/17/2023   PLT 225 01/17/2023   NEUTROABS 5.4 01/17/2023    CMP  Lab Results  Component Value Date   NA 135 01/17/2023   K 3.9 01/17/2023   CL 99 01/17/2023   CO2 30 01/17/2023   GLUCOSE 149 (H) 01/17/2023   BUN 9 01/17/2023   CREATININE 0.57 (L) 01/17/2023   CALCIUM 9.6 01/17/2023   PROT 6.2 (L) 01/17/2023   ALBUMIN 3.8 01/17/2023   AST 20 01/17/2023   ALT 27 01/17/2023   ALKPHOS 72 01/17/2023   BILITOT 0.5 01/17/2023   GFRNONAA >60 01/17/2023   GFRAA >90 08/10/2013    Lab Results  Component Value Date   [id number] 2,762 (H) 12/12/2022    Medications: I have reviewed the patient's current medications.   Assessment/Plan:  Pancreas cancer FNA biopsy of a pancreas body/tail mass 07/05/2022-adenocarcinoma CT abdomen/pelvis 06/14/2022-hypoenhancing  pancreas body/tail mass with effacement of the splenic vein, no evidence of lymphadenopathy or metastatic disease CA 19-9 on 06/18/2022-767 EUS 07/05/2022-a 7 x 29 mm pancreas body/tail mass, abutment of the splenic artery, no malignant appearing lymph nodes, T2N0 by EUS CTs 07/15/2022-pancreas body/tail mass with no evidence of metastatic disease, splenic vein associated with the tumor, no arterial involvement, stable right greater than left lung nodules favored benign Cycle 1 FOLFOX 08/01/2022 Cycle 2 FOLFOX, 08/16/2022, neulasta Cycle 3 FOLFIRINOX 08/29/2022, Neulasta Cycle 4 FOLFIRINOX 09/12/2022, Neulasta, irinotecan and 5-fluorouracil dose reduced due to diarrhea/weight loss Cycle 5 FOLFIRINOX 09/26/2022 CT abdomen/pelvis 10/09/2022-no significant change in the appearance of the ill-defined pancreatic tail mass.  Persistent extrinsic mass effect on the splenic vein which remains patent.  Indeterminate left adrenal nodule. 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  Foundation 1-microsatellite stable, tumor mutation burden 1, K-ras G12R CTs 12/12/2022-increasing size of pancreatic mass with suspected metastatic disease involving the lesser sac and adjacent stomach.  New area of masslike nodularity anterior to the pancreatic neck appears to tether the gastric antrum.  Worsening of peritoneal disease along the inferior surface of the right hepatic margin.  Lesion along the margin of the sigmoid colon is favored to represent a tumor implant.  Enlarging left adrenal gland in close proximity to the pancreatic lesion.  Scattered more conspicuous areas of peritoneal and omental  involvement.  Vascular involvement seen on previous imaging most notably of the splenic vein and splenic artery. Cycle 1 gemcitabine/Abraxane 12/13/2022 Chemotherapy held 12/27/2022 due to mild  neutropenia Cycle 2 gemcitabine/Abraxane 01/03/2023 Prostate cancer, status post prostatectomy 1990 Chronic elevation of the PSA-maintained on Trelstar, followed by Dr. Jeffie Pollock   3.  Kidney stones 4.   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  Abdominal pain, secondary to #1 Celiac plexus block 11/29/2022 6.  Family history of prostate and pancreas cancer 7.  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  Typhlitis in 2014 9.  Colon polyps-tubular adenomas on colonoscopy 03/27/2022 10.  Presentation emergency room 10/01/2022 and 10/02/2022 with nausea/vomiting, diarrhea, and chest pain-esophagitis 11.  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  He has completed 2 treatments with gemcitabine/Abraxane.  We will follow-up on the CEA 19-9 from today.  His overall clinical status has declined.  I suspect the hospital admission with mild leg edema and erythema was related to skin toxicity from gemcitabine.  He has pain secondary to the primary pancreas tumor and carcinomatosis.  We will adjust the narcotic regimen today.  He will increase the Duragesic patch to 100 mcg.  He will take Dilaudid (12 mg) as needed for breakthrough pain.  We will change to Roxanol for breakthrough pain if the Dilaudid is not helpful.  We will contact interventional radiology for a repeat celiac block.  Jared [name] does not wish to receive chemotherapy today.  Chemotherapy will be rescheduled for 2 weeks.  We discussed palliative care and hospice.  We discussed the expected prognosis of a few months in the absence of systemic therapy.  We discussed the possibility of pain relief and prolongation of survival with systemic therapy.  We will discontinue the gemcitabine/Abraxane and refer him for hospice care if his clinical status continues to  decline.    Betsy Coder, MD  01/17/2023  10:04 AM

## 2023-01-17 NOTE — Telephone Encounter (Signed)
Patient's wife called and left a message stated the pharmacy had not received his fentanyl 100 mcg prescription. I called the patient to let her know the fentanyl was send in. She was wonder if he can use 2 patches of the  50 mcg on the patient . I advice the patient it is okay to use 2 patches of the 50 mcg patches.

## 2023-01-17 NOTE — Patient Instructions (Signed)

## 2023-01-17 NOTE — Progress Notes (Signed)
Per Dr. [name]: Order referral for IR, Dr. Serafina Royals or Dr. Maryelizabeth Kaufmann for celiac block asap for back pain

## 2023-01-17 NOTE — Patient Instructions (Signed)
Morphine Injection What is this medication? MORPHINE (MOR feen) treats severe pain. It is prescribed when other pain medications have not worked or cannot be tolerated. It works by blocking pain signals in the brain. It belongs to a group of medications called opioids. This medicine may be used for other purposes; ask your health care provider or pharmacist if you have questions. COMMON BRAND NAME(S): Astramorph PF, Duramorph, Duramorph PF, Infumorph, MITIGO What should I tell my care team before I take this medication? They need to know if you have any of these conditions: Bleeding disorder Brain tumor Frequently drink alcohol Head injury Heart disease Low adrenal gland function Lung or breathing disease, such as asthma Seizures Stomach or intestine problems History of substance use disorder Take medications that treat or prevent blood clots Taken an MAOI, such as Marplan, Nardil, or Parnate in the last 14 days Trouble passing urine An unusual or allergic reaction to morphine, other medications, foods, dyes, or preservatives Pregnant or trying to get pregnant Breastfeeding How should I use this medication? This medication is injected into a muscle, vein, or under the skin. It is usually given by your care team in a hospital or clinic setting. It may also be given at home. If you get this medication at home, you will be taught how to prepare and give it. Use exactly as directed. Take it as directed on the prescription label. Do not take it more often than directed. There may be unused or extra doses after you finish your treatment. Talk to your care team if you have questions about your dose. Always look at your medication before using it. Do not use the injection if its color is darker than pale yellow or if it is discolored in any other way. Do not use this medication if it is cloudy, thickened, colored, or has solid particles in it. It is important that you put your used needles and  syringes in a special sharps container. Do not put them in a trash can. If you do not have a sharps container, call your pharmacist or care team to get one. Talk to your care team about the use of this medication in children. Special care may be needed. Overdosage: If you think you have taken too much of this medicine contact a poison control center or emergency room at once. NOTE: This medicine is only for you. Do not share this medicine with others. What if I miss a dose? If you miss a dose, take it as soon as you can. If it is almost time for your next dose, take only that dose. Do not take double or extra doses. What may interact with this medication? Do not take this medication with any of the following: Linezolid MAOIs, such as Marplan, Nardil, and Parnate Methylene blue Samidorphan This medication may interact with the following: Alcohol Antihistamines for allergy, cough, and cold Atropine Certain medications for anxiety or sleep Certain medications for bladder problems, such as oxybutynin, tolterodine Certain medications for depression, such as amitriptyline, fluoxetine, sertraline, mirtazapine, trazodone Certain medications for migraine headache, such as almotriptan, eletriptan, frovatriptan, naratriptan, rizatriptan, sumatriptan, zolmitriptan Certain medications for nausea or vomiting, such as dolasetron, granisetron, ondansetron, palonosetron Certain medications for Parkinson disease, such as benztropine, trihexyphenidyl Certain medications for seizures, such as phenobarbital, primidone Certain medications for stomach problems, such as dicyclomine, hyoscyamine Certain medications for travel sickness, such as scopolamine Clopidogrel Diuretics General anesthetics, such as halothane, isoflurane, methoxyflurane, propofol Ipratropium Medications that relax muscles Other opioid medications  for pain or cough Phenothiazines, such as chlorpromazine, mesoridazine, prochlorperazine,  thioridazine Prasugrel Ticagrelor This list may not describe all possible interactions. Give your health care provider a list of all the medicines, herbs, non-prescription drugs, or dietary supplements you use. Also tell them if you smoke, drink alcohol, or use illegal drugs. Some items may interact with your medicine. What should I watch for while using this medication? Tell your care team if your pain does not go away, if it gets worse, or if you have new or a different type of pain. You may develop tolerance to this medication. Tolerance means that you will need a higher dose of the medication for pain relief. Tolerance is normal and is expected if you take this medication for a long time. Taking this medication with other substances that cause drowsiness, such as alcohol, benzodiazepines, or other opioids can cause serious side effects. Give your care team a list of all medications you use. They will tell you how much medication to take. Do not take more medication than directed. Call emergency services if you have problems breathing or staying awake. Children may be at higher risk for side effects. Stop giving this medication and call emergency services right away if your child has slow or noisy breathing, has confusion, is unusually sleepy, or not able to wake up. Long term use of this medication may cause your brain and body to depend on it. This can happen even when used as directed by your care team. You and your care team will work together to determine how long you will need to take this medication. If your care team wants you to stop this medication, the dose will be slowly lowered over time to reduce the risk of side effects. Naloxone is an emergency medication used for an opioid overdose. An overdose can happen if you take too much of an opioid. It can also happen if an opioid is taken with some other medications or substances such as alcohol. Know the symptoms of an overdose, such as trouble  breathing, unusually tired or sleepy, or not being able to respond or wake up. Make sure to tell caregivers and close contacts where your naloxone is stored. Make sure they know how to use it. After naloxone is given, the person giving it must call emergency services. Naloxone is a temporary treatment. Repeat doses may be needed. This medication may affect your coordination, reaction time, or judgment. Do not drive or operate machinery until you know how this medication affects you. Sit up or stand slowly to reduce the risk of dizzy or fainting spells. Drinking alcohol with this medication can increase the risk of these side effects. This medication will cause constipation. If you do not have a bowel movement for 3 days, call your care team. Your mouth may get dry. Chewing sugarless gum or sucking hard candy and drinking plenty of water may help. Contact your care team if the problem does not go away or is severe. Talk to your care team if you are pregnant or think you might be pregnant. This medication can cause serious birth defects if taken during pregnancy. Prolonged use of this medication during pregnancy can cause withdrawal in a newborn. Talk to your care team before breastfeeding. Changes to your treatment plan may be needed. If you breastfeed while taking this medication, seek medical care right away if you notice the child has slow or noisy breathing, is unusually sleepy or not able to wake up, or is limp.  Long-term use of this medication may cause infertility. Talk to your care team if you are concerned about your fertility. What side effects may I notice from receiving this medication? Side effects that you should report to your care team as soon as possible: Allergic reactions--skin rash, itching, hives, swelling of the face, lips, tongue, or throat CNS depression--slow or shallow breathing, shortness of breath, feeling faint, dizziness, confusion, difficulty staying awake Low adrenal gland  function--nausea, vomiting, loss of appetite, unusual weakness or fatigue, dizziness Low blood pressure--dizziness, feeling faint or lightheaded, blurry vision Side effects that usually do not require medical attention (report to your care team if they continue or are bothersome): Constipation Dizziness Drowsiness Dry mouth Headache Nausea Vomiting This list may not describe all possible side effects. Call your doctor for medical advice about side effects. You may report side effects to FDA at 1-800-FDA-1088. Where should I keep my medication? Keep out of the reach of children and pets. This medication can be abused. Keep it in a safe place to protect it from theft. Do not share this medication with anyone. Selling or giving away this medication is dangerous and is against the law. If you are using this medication at home, you will be instructed on how to store this medication. Throw away any unused medication after the expiration date on the label. Discard unused medication and used packaging carefully. Children and pets can be harmed if they find used or lost packages. NOTE: This sheet is a summary. It may not cover all possible information. If you have questions about this medicine, talk to your doctor, pharmacist, or health care provider.  2023 Elsevier/Gold Standard (2007-12-06 00:00:00)

## 2023-01-19 LAB — CANCER ANTIGEN 19-9: CA 19-9: 31674 U/mL — ABNORMAL HIGH (ref 0–35)

## 2023-01-21 ENCOUNTER — Telehealth: Payer: Self-pay

## 2023-01-21 NOTE — Telephone Encounter (Signed)
Patient's wife  called about CA 19.9 result. Per Dr. [name] CA 19.9 level shows his chemo is not responding. Dr. [name] states he explained this to the patient and his wife at the last visit. Patient and his wife gave verbal understanding and had no further questions or concerns.

## 2023-01-21 NOTE — Progress Notes (Deleted)
[place]  [phone] [phone]) [phone]   Name: Jared [name] Date: 01/21/2023 MRN: [id number]  DOB: 1945/12/14  Patient Care Team: [name], MD as PCP - General (Family Medicine) Irine Seal, MD (Urology) Algie Coffer Mindi Slicker, RN as Oncology Nurse Navigator    REASON FOR CONSULTATION: Jared [name] is a 77 y.o. male with oncologic medical history including pancreatic cancer (06/2022) on chemotherapy, anemia of chronic disease, GERD, CKD, and hyperlipidemia. Palliative ask to see for symptom management and goals of care.    SOCIAL HISTORY:     reports that he quit smoking about 41 years ago. His smoking use included cigarettes. He has a 10.00 pack-year smoking history. He has never used smokeless tobacco. He reports that he does not drink alcohol and does not use drugs.  ADVANCE DIRECTIVES:  Advanced directives on file  CODE STATUS: Full code  PAST MEDICAL HISTORY: Past Medical History:  Diagnosis Date   Aortic atherosclerosis ([place]) 10/02/2022   Arthritis    SHOULDER & KNEES   Bilateral cataracts 10/02/2022   Cataract    bilateral sx   Choledocholithiasis 07/23/2022   Chronic kidney disease    pt states he does not have kidney disease, just a hx of kidney stones   GERD 02/22/2009   Qualifier: Diagnosis of   By: Carlean Purl MD, Tonna Boehringer E      Glaucoma 10/02/2022   History of kidney stones    Hyperlipidemia 09/06/2021   The 10-year ASCVD risk score (Arnett DK, et al., 2019) is: 12.5%    Values used to calculate the score:      Age: 51 years      Sex: Male      Is Non-Hispanic African American: Yes      Diabetic: No      Tobacco smoker: No      Systolic Blood Pressure: 0000000 mmHg      Is BP treated: No      HDL Cholesterol: 82 mg/dL      Total Cholesterol: 241 mg/dL      Inguinal hernia    right   Leukocytosis 10/02/2022   Pancreatic cancer ([place]) 2023   Prostate cancer ([place]) 10/29/1988    PAST SURGICAL  HISTORY:  Past Surgical History:  Procedure Laterality Date   CATARACT EXTRACTION Bilateral    CHOLECYSTECTOMY N/A 07/14/2013   Procedure: LAPAROSCOPIC CHOLECYSTECTOMY;  Surgeon: Stark Klein, MD;  Location: [facility];  Service: General;  Laterality: N/A;   COLONOSCOPY  01/07/2004   Dr. Silvano Rusk   COLONOSCOPY  02/2022   CG-MAC-mira(good)-tics-2 polyps   ENDOSCOPIC RETROGRADE CHOLANGIOPANCREATOGRAPHY (ERCP) WITH PROPOFOL N/A 08/23/2022   Procedure: ENDOSCOPIC RETROGRADE CHOLANGIOPANCREATOGRAPHY (ERCP) WITH PROPOFOL;  Surgeon: Irving Copas., MD;  Location: [facility];  Service: Gastroenterology;  Laterality: N/A;   ESOPHAGOGASTRODUODENOSCOPY (EGD) WITH PROPOFOL N/A 07/05/2022   Procedure: ESOPHAGOGASTRODUODENOSCOPY (EGD) WITH PROPOFOL;  Surgeon: Rush Landmark Telford Nab., MD;  Location: [place] [facility];  Service: Gastroenterology;  Laterality: N/A;   EUS N/A 07/05/2022   Procedure: UPPER ENDOSCOPIC ULTRASOUND (EUS) RADIAL;  Surgeon: Irving Copas., MD;  Location: [facility];  Service: Gastroenterology;  Laterality: N/A;   EYE SURGERY     FINE NEEDLE ASPIRATION  07/05/2022   Procedure: FINE NEEDLE ASPIRATION (FNA) LINEAR;  Surgeon: Irving Copas., MD;  Location: [place] [facility] [place];  Service: Gastroenterology;;   INGUINAL HERNIA REPAIR Right    LAPAROSCOPY N/A 11/27/2022   Procedure: DIAGNOSTIC LAPAROSCOPY;  Surgeon: Stark Klein, MD;  Location: [place];  Service: General;  Laterality: N/A;  EPIDURAL   LAPAROTOMY N/A 11/27/2022   Procedure: EXPLORATORY LAPAROTOMY;  Surgeon: Stark Klein, MD;  Location: [place];  Service: General;  Laterality: N/A;   OPERATIVE ULTRASOUND N/A 11/27/2022   Procedure: INTRAOPERATIVE ULTRASOUND;  Surgeon: Stark Klein, MD;  Location: [facility] [place];  Service: General;  Laterality: N/A;   PORTACATH PLACEMENT Left 07/31/2022   Procedure: INSERTION PORT-A-CATH;  Surgeon: Stark Klein, MD;  Location: [place];  Service: General;  Laterality: Left;   PROSTATECTOMY      REMOVAL OF STONES  08/23/2022   Procedure: REMOVAL OF STONES;  Surgeon: Irving Copas., MD;  Location: [facility];  Service: Gastroenterology;;   Joan Mayans  08/23/2022   Procedure: SPHINCTEROTOMY;  Surgeon: Mansouraty, Telford Nab., MD;  Location: [facility];  Service: Gastroenterology;;   TONSILLECTOMY      HEMATOLOGY/ONCOLOGY HISTORY:  Oncology History  Cancer of pancreas, body ([place])  07/17/2022 Initial Diagnosis   Cancer of pancreas, body ([place])   07/17/2022 Cancer Staging   Staging form: Exocrine Pancreas, AJCC 8th Edition - Clinical: Stage IB (cT2, cN0, cM0) - Signed by Ladell Pier, MD on 07/17/2022 Total positive nodes: 0   08/01/2022 - 09/28/2022 Chemotherapy   Patient is on Treatment Plan : PANCREAS Modified FOLFIRINOX q14d x 4 cycles     08/10/2022 Genetic Testing   Negative genetic testing.  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  Report date is 08/10/2022.   The CancerNext-Expanded gene panel offered by [facility] and includes sequencing, rearrangement, and RNA analysis for the following 77 genes: AIP, ALK, APC, ATM, AXIN2, BAP1, BARD1, BLM, BMPR1A, BRCA1, BRCA2, BRIP1, CDC73, CDH1, CDK4, CDKN1B, CDKN2A, CHEK2, CTNNA1, DICER1, FANCC, FH, FLCN, GALNT12, KIF1B, LZTR1, MAX, MEN1, MET, MLH1, MSH2, MSH3, MSH6, MUTYH, NBN, NF1, NF2, NTHL1, PALB2, PHOX2B, PMS2, POT1, PRKAR1A, PTCH1, PTEN, RAD51C, RAD51D, RB1, RECQL, RET, SDHA, SDHAF2, SDHB, SDHC, SDHD, SMAD4, SMARCA4, SMARCB1, SMARCE1, STK11, SUFU, TMEM127, TP53, TSC1, TSC2, VHL and XRCC2 (sequencing and deletion/duplication); EGFR, EGLN1, HOXB13, KIT, MITF, PDGFRA, POLD1, and POLE (sequencing only); EPCAM and GREM1 (deletion/duplication only).    12/13/2022 -  Chemotherapy   Patient is on Treatment Plan : PANCREATIC Abraxane D1,8,15 + Gemcitabine D1,8,15 q28d       ALLERGIES:  is allergic to pseudoephedrine and lupron [leuprolide].  MEDICATIONS:  Current Outpatient  Medications  Medication Sig Dispense Refill   acetaminophen (TYLENOL) 325 MG tablet Take 325 mg by mouth daily as needed for headache.     brimonidine (ALPHAGAN) 0.15 % ophthalmic solution Place 1 drop into the left eye in the morning and at bedtime.     Carboxymethylcell-Glycerin PF (REFRESH OPTIVE PF) 0.5-0.9 % SOLN Place 1 drop into both eyes as needed (dry eyes).     cholecalciferol (VITAMIN D3) 25 MCG (1000 UNIT) tablet Take 1,000 Units by mouth daily.     diphenhydrAMINE (BENADRYL) 25 mg capsule Take 1 capsule (25 mg total) by mouth every 6 (six) hours as needed (left leg erythema). 30 capsule 0   Dorzolamide HCl-Timolol Mal PF 2-0.5 % SOLN Place 1 drop into the left eye 2 (two) times daily.     fentaNYL (DURAGESIC) 100 MCG/HR Place 1 patch onto the skin every 3 (three) days. 5 patch 0   HYDROmorphone (DILAUDID) 4 MG tablet Take 1-2 tablets (4-8 mg total) by mouth every 4 (four) hours as needed for severe pain. 150 tablet 0   lidocaine-prilocaine (EMLA) cream Apply 1 Application topically  as needed. 30 g 2   olopatadine (PATANOL) 0.1 % ophthalmic solution Place 1 drop into both eyes daily as needed for allergies.     ondansetron (ZOFRAN) 8 MG tablet Take 1 tablet (8 mg total) by mouth every 8 (eight) hours as needed for nausea or vomiting (Starting day 3 after chemo as needed for nausea(received Aloxi with chemo)). 30 tablet 1   pantoprazole (PROTONIX) 40 MG tablet Take 1 tablet (40 mg total) by mouth 2 (two) times daily. 60 tablet 1   Polyethylene Glycol 3350 (MIRALAX PO) Take 17 g by mouth daily as needed (constipation). (Patient not taking: Reported on 01/17/2023)     predniSONE (DELTASONE) 10 MG tablet Take 1 tablet (10 mg total) by mouth daily with breakfast. 90 tablet 0   prochlorperazine (COMPAZINE) 10 MG tablet Take 1 tablet (10 mg total) by mouth every 6 (six) hours as needed for nausea or vomiting. (Patient not taking: Reported on 01/17/2023) 30 tablet 1   sodium chloride (AYR) 0.65 %  nasal spray Place 2-3 sprays into the nose as needed for congestion.     tiZANidine (ZANAFLEX) 4 MG tablet Take 1 tablet (4 mg total) by mouth every 8 (eight) hours as needed for muscle spasms. (Patient not taking: Reported on 01/17/2023) 30 tablet 0   Triptorelin Pamoate 11.25 MG SUSR Inject 11.25 mg into the muscle as needed (PSA). (Patient not taking: Reported on 01/17/2023)     No current facility-administered medications for this visit.    VITAL SIGNS: There were no vitals taken for this visit. There were no vitals filed for this visit.  Estimated body mass index is 27.82 kg/m as calculated from the following:   Height as of 01/17/23: 6' (1.829 m).   Weight as of 01/17/23: 205 lb 1.6 oz (93 kg).  LABS: CBC:    Component Value Date/Time   WBC 8.5 01/17/2023 0900   WBC 3.5 (L) 01/10/2023 0535   HGB 12.3 (L) 01/17/2023 0900   HGB 14.7 08/30/2020 0922   HCT 35.9 (L) 01/17/2023 0900   HCT 45.7 08/30/2020 0922   PLT 225 01/17/2023 0900   PLT 215 08/30/2020 0922   MCV 83.9 01/17/2023 0900   MCV 86 08/30/2020 0922   NEUTROABS 5.4 01/17/2023 0900   NEUTROABS 2.0 08/30/2020 0922   LYMPHSABS 1.4 01/17/2023 0900   LYMPHSABS 1.0 08/30/2020 0922   MONOABS 1.4 (H) 01/17/2023 0900   EOSABS 0.3 01/17/2023 0900   EOSABS 0.1 08/30/2020 0922   BASOSABS 0.0 01/17/2023 0900   BASOSABS 0.1 08/30/2020 0922   Comprehensive Metabolic Panel:    Component Value Date/Time   NA 135 01/17/2023 0900   NA 141 10/16/2022 1047   K 3.9 01/17/2023 0900   CL 99 01/17/2023 0900   CO2 30 01/17/2023 0900   BUN 9 01/17/2023 0900   BUN 8 10/16/2022 1047   CREATININE 0.57 (L) 01/17/2023 0900   CREATININE 0.77 08/24/2015 1137   GLUCOSE 149 (H) 01/17/2023 0900   CALCIUM 9.6 01/17/2023 0900   AST 20 01/17/2023 0900   ALT 27 01/17/2023 0900   ALKPHOS 72 01/17/2023 0900   BILITOT 0.5 01/17/2023 0900   PROT 6.2 (L) 01/17/2023 0900   ALBUMIN 3.8 01/17/2023 0900    RADIOGRAPHIC STUDIES: DG Chest Port 1  View  Result Date: 01/07/2023 CLINICAL DATA:  Questionable sepsis EXAM: PORTABLE CHEST 1 VIEW COMPARISON:  Chest x-ray dated October 02, 2022 FINDINGS: Cardiac and mediastinal contours are unchanged. Left chest wall port is unchanged in  position. Lungs are clear. No pleural effusion or pneumothorax. IMPRESSION: No active disease. Electronically Signed   By: Yetta Glassman M.D.   On: 01/07/2023 18:41   PERFORMANCE STATUS (ECOG) : {CHL ONC ECOG [id number]  Review of Systems Unless otherwise noted, a complete review of systems is negative.  Physical Exam General: NAD Cardiovascular: regular rate and rhythm Pulmonary: clear ant fields Abdomen: soft, nontender, + bowel sounds GU: no suprapubic tenderness Extremities: no edema, no joint deformities Skin: no rashes Neurological:  IMPRESSION: *** I introduced myself, Gerold Sar RN, and Palliative's role in collaboration with the oncology team. Concept of Palliative Care was introduced as specialized medical care for people and their families living with serious illness.  It focuses on providing relief from the symptoms and stress of a serious illness.  The goal is to improve quality of life for both the patient and the family. Values and goals of care important to patient and family were attempted to be elicited.    We discussed *** current illness and what it means in the larger context of *** on-going co-morbidities. Natural disease trajectory and expectations were discussed.  I discussed the importance of continued conversation with family and their medical providers regarding overall plan of care and treatment options, ensuring decisions are within the context of the patients values and GOCs.  PLAN: Established therapeutic relationship. Education provided on palliative's role in collaboration with their Oncology/Radiation team. I will plan to see patient back in 2-4 weeks in collaboration to other oncology appointments.    Patient  expressed understanding and was in agreement with this plan. He also understands that He can call the clinic at any time with any questions, concerns, or complaints.   Thank you for your referral and allowing Palliative to assist in Mr. Jared [name]'s care.   Number and complexity of problems addressed: ***HIGH - 1 or more chronic illnesses with SEVERE exacerbation, progression, or side effects of treatment - advanced cancer, pain. Any controlled substances utilized were prescribed in the context of palliative care.  Time Total: ***  Visit consisted of counseling and education dealing with the complex and emotionally intense issues of symptom management and palliative care in the setting of serious and potentially life-threatening illness.Greater than 50%  of this time was spent counseling and coordinating care related to the above assessment and plan.  Signed by: Alda Lea, AGPCNP-BC Palliative Medicine Team/[place]   *Please note that this is a verbal dictation therefore any spelling or grammatical errors are due to the "[place] [employer]" system interpretation.\

## 2023-01-22 ENCOUNTER — Telehealth: Payer: Self-pay | Admitting: Nurse Practitioner

## 2023-01-22 ENCOUNTER — Encounter: Payer: Self-pay | Admitting: Oncology

## 2023-01-22 ENCOUNTER — Encounter: Payer: Self-pay | Admitting: *Deleted

## 2023-01-22 ENCOUNTER — Other Ambulatory Visit: Payer: Self-pay | Admitting: Nurse Practitioner

## 2023-01-22 ENCOUNTER — Other Ambulatory Visit (HOSPITAL_BASED_OUTPATIENT_CLINIC_OR_DEPARTMENT_OTHER): Payer: Self-pay

## 2023-01-22 ENCOUNTER — Other Ambulatory Visit: Payer: Self-pay | Admitting: Oncology

## 2023-01-22 DIAGNOSIS — C251 Malignant neoplasm of body of pancreas: Secondary | ICD-10-CM

## 2023-01-22 MED ORDER — HYDROMORPHONE HCL 4 MG PO TABS
8.0000 mg | ORAL_TABLET | ORAL | 0 refills | Status: DC | PRN
  Filled 2023-01-22: qty 150, 9d supply, fill #0

## 2023-01-22 NOTE — Progress Notes (Signed)
Left VM w/IR scheduler at [facility] for status of referral for celiac block.

## 2023-01-22 NOTE — Telephone Encounter (Signed)
Per 3/25 IB reached out to patient to reschedule; per patients wife she wants to move it to next Tuesday so her daughter can help. Wife aware of time and date change.

## 2023-01-24 ENCOUNTER — Other Ambulatory Visit: Payer: Self-pay | Admitting: Radiology

## 2023-01-24 ENCOUNTER — Inpatient Hospital Stay: Payer: Medicare HMO

## 2023-01-24 ENCOUNTER — Other Ambulatory Visit (HOSPITAL_COMMUNITY): Payer: Self-pay

## 2023-01-24 DIAGNOSIS — C259 Malignant neoplasm of pancreas, unspecified: Secondary | ICD-10-CM

## 2023-01-25 ENCOUNTER — Other Ambulatory Visit: Payer: Self-pay | Admitting: Student

## 2023-01-28 ENCOUNTER — Ambulatory Visit (HOSPITAL_COMMUNITY)
Admission: RE | Admit: 2023-01-28 | Discharge: 2023-01-28 | Disposition: A | Discharge status: Home / Self Care | Payer: Medicare HMO | Source: Ambulatory Visit | Attending: Oncology | Admitting: Oncology

## 2023-01-28 ENCOUNTER — Other Ambulatory Visit: Payer: Self-pay

## 2023-01-28 ENCOUNTER — Encounter (HOSPITAL_COMMUNITY): Payer: Self-pay

## 2023-01-28 DIAGNOSIS — C252 Malignant neoplasm of tail of pancreas: Secondary | ICD-10-CM | POA: Insufficient documentation

## 2023-01-28 DIAGNOSIS — Z9221 Personal history of antineoplastic chemotherapy: Secondary | ICD-10-CM | POA: Insufficient documentation

## 2023-01-28 DIAGNOSIS — R109 Unspecified abdominal pain: Secondary | ICD-10-CM | POA: Insufficient documentation

## 2023-01-28 DIAGNOSIS — C259 Malignant neoplasm of pancreas, unspecified: Secondary | ICD-10-CM

## 2023-01-28 DIAGNOSIS — R112 Nausea with vomiting, unspecified: Secondary | ICD-10-CM | POA: Diagnosis not present

## 2023-01-28 LAB — CBC
HCT: 36.3 % — ABNORMAL LOW (ref 39.0–52.0)
Hemoglobin: 11.5 g/dL — ABNORMAL LOW (ref 13.0–17.0)
MCH: 27.7 pg (ref 26.0–34.0)
MCHC: 31.7 g/dL (ref 30.0–36.0)
MCV: 87.5 fL (ref 80.0–100.0)
Platelets: 241 10*3/uL (ref 150–400)
RBC: 4.15 MIL/uL — ABNORMAL LOW (ref 4.22–5.81)
RDW: 16.1 % — ABNORMAL HIGH (ref 11.5–15.5)
WBC: 6.4 10*3/uL (ref 4.0–10.5)
nRBC: 0 % (ref 0.0–0.2)

## 2023-01-28 LAB — PROTIME-INR
INR: 1.1 (ref 0.8–1.2)
Prothrombin Time: 14 seconds (ref 11.4–15.2)

## 2023-01-28 MED ORDER — ALCOHOL (ABLYSINOL) 99% IA SOLN: 15.0000 mL | Freq: Once | INTRA_ARTERIAL | Status: DC

## 2023-01-28 MED ORDER — LIDOCAINE HCL 1 % IJ SOLN: 10.0000 mL | Freq: Once | INTRAMUSCULAR | Status: DC

## 2023-01-28 MED ORDER — SODIUM CHLORIDE 0.9 % IV SOLN: INTRAVENOUS | Status: DC

## 2023-01-28 MED ORDER — BUPIVACAINE HCL (PF) 0.5 % IJ SOLN
30.0000 mL | Freq: Once | INTRAMUSCULAR | Status: DC
  Filled 2023-01-28: qty 30

## 2023-01-28 MED ORDER — HEPARIN SOD (PORK) LOCK FLUSH 100 UNIT/ML IV SOLN
INTRAVENOUS | Status: AC
  Administered 2023-01-28: 500 [IU]
  Filled 2023-01-28: qty 5

## 2023-01-28 MED ORDER — HEPARIN SOD (PORK) LOCK FLUSH 100 UNIT/ML IV SOLN: 500.0000 [IU] | INTRAVENOUS | Status: AC | PRN

## 2023-01-28 MED ORDER — HYDROMORPHONE HCL 1 MG/ML IJ SOLN
INTRAMUSCULAR | Status: AC
  Filled 2023-01-28: qty 1

## 2023-01-28 MED ORDER — MIDAZOLAM HCL 2 MG/2ML IJ SOLN
INTRAMUSCULAR | Status: AC | PRN
  Administered 2023-01-28: 1 mg via INTRAVENOUS

## 2023-01-28 MED ORDER — MIDAZOLAM HCL 2 MG/2ML IJ SOLN
INTRAMUSCULAR | Status: AC
  Filled 2023-01-28: qty 2

## 2023-01-28 MED ORDER — FENTANYL CITRATE (PF) 100 MCG/2ML IJ SOLN
INTRAMUSCULAR | Status: AC
  Filled 2023-01-28: qty 2

## 2023-01-28 MED ORDER — IOHEXOL 350 MG/ML SOLN
10.0000 mL | Freq: Once | INTRAVENOUS | Status: AC | PRN
  Administered 2023-01-28: 10 mL

## 2023-01-28 MED ORDER — FENTANYL CITRATE (PF) 100 MCG/2ML IJ SOLN
INTRAMUSCULAR | Status: AC | PRN
  Administered 2023-01-28 (×2): 50 ug via INTRAVENOUS

## 2023-01-28 MED ORDER — HYDROMORPHONE HCL 2 MG PO TABS
8.0000 mg | ORAL_TABLET | Freq: Once | ORAL | Status: DC
  Filled 2023-01-28: qty 2

## 2023-01-28 MED ORDER — SODIUM CHLORIDE 0.9% FLUSH
10.0000 mL | INTRAVENOUS | Status: AC | PRN
  Administered 2023-01-28: 10 mL

## 2023-01-28 MED ORDER — SODIUM CHLORIDE 0.9 % IV BOLUS: 500.0000 mL | Freq: Once | INTRAVENOUS | Status: DC

## 2023-01-28 NOTE — Progress Notes (Signed)
Dr Serafina Royals notified of B/P less than [id number] systolic and per Dr Serafina Royals give PO dilaudid and order received for NSS  bolus

## 2023-01-28 NOTE — H&P (Signed)
Chief Complaint: Patient was seen in consultation today for abdominal pain  Referring Physician(s): Betsy Coder B  Supervising Physician: Ruthann Cancer  Patient Status: [facility] - Out-pt  History of Present Illness: Jared [name] is a 77 y.o. 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  He underwent  ex lap with omental biopsy 11/27/22 and was found to have metastatic disease and poor pain control.  He was severely limited by pain affecting his mobility, recovery, and PO intake.  For this he was referred to IR for celiac plexus block.  He underwent successful procedure 11/29/22 however experienced transient worsening of his pain.  He was discharged home with plans for pain management which reportedly took several days to get under good control.  Eventually, he did experience improvement in pain, however remains limited particularly in PO intake due to it's severity.  He is currently on a fentanyl patch with hydromorphone 12mg  q 4 hrs scheduled at home.  His intake has remained poor.  He is referred to IR for neurolysis.   Patient assessed at bedside alongside his wife and daughter.  He confirms history above and reports that pain management is greatest barrier to quality of life.  His current regimen provides intermittent relief but he remains open to ongoing therapeutic procedures for improvement pain management.  He is agreeable to celiac plexus neurolysis today.  He understands the risks, benefits, and goals of the procedure today.  He has been NPO.   Past Medical History:  Diagnosis Date   Aortic atherosclerosis 10/02/2022   Arthritis    SHOULDER & KNEES   Bilateral cataracts 10/02/2022   Cataract    bilateral sx   Choledocholithiasis 07/23/2022   Chronic kidney disease    pt states he does not have kidney disease, just a hx of kidney stones   GERD 02/22/2009   Qualifier: Diagnosis of   By: Carlean Purl MD, Tonna Boehringer E      Glaucoma 10/02/2022   History of  kidney stones    Hyperlipidemia 09/06/2021   The 10-year ASCVD risk score (Arnett DK, et al., 2019) is: 12.5%    Values used to calculate the score:      Age: 20 years      Sex: Male      Is Non-Hispanic African American: Yes      Diabetic: No      Tobacco smoker: No      Systolic Blood Pressure: 0000000 mmHg      Is BP treated: No      HDL Cholesterol: 82 mg/dL      Total Cholesterol: 241 mg/dL      Inguinal hernia    right   Leukocytosis 10/02/2022   Pancreatic cancer 2023   Prostate cancer 10/29/1988    Past Surgical History:  Procedure Laterality Date   CATARACT EXTRACTION Bilateral    CHOLECYSTECTOMY N/A 07/14/2013   Procedure: LAPAROSCOPIC CHOLECYSTECTOMY;  Surgeon: Stark Klein, MD;  Location: [facility];  Service: General;  Laterality: N/A;   COLONOSCOPY  01/07/2004   Dr. Silvano Rusk   COLONOSCOPY  02/2022   CG-MAC-mira(good)-tics-2 polyps   ENDOSCOPIC RETROGRADE CHOLANGIOPANCREATOGRAPHY (ERCP) WITH PROPOFOL N/A 08/23/2022   Procedure: ENDOSCOPIC RETROGRADE CHOLANGIOPANCREATOGRAPHY (ERCP) WITH PROPOFOL;  Surgeon: Irving Copas., MD;  Location: [facility];  Service: Gastroenterology;  Laterality: N/A;   ESOPHAGOGASTRODUODENOSCOPY (EGD) WITH PROPOFOL N/A 07/05/2022   Procedure: ESOPHAGOGASTRODUODENOSCOPY (EGD) WITH PROPOFOL;  Surgeon: Rush Landmark Telford Nab., MD;  Location: [place];  Service: Gastroenterology;  Laterality: N/A;   EUS N/A 07/05/2022   Procedure: UPPER ENDOSCOPIC ULTRASOUND (EUS) RADIAL;  Surgeon: Irving Copas., MD;  Location: [place];  Service: Gastroenterology;  Laterality: N/A;   EYE SURGERY     FINE NEEDLE ASPIRATION  07/05/2022   Procedure: FINE NEEDLE ASPIRATION (FNA) LINEAR;  Surgeon: Irving Copas., MD;  Location: [place];  Service: Gastroenterology;;   INGUINAL HERNIA REPAIR Right    LAPAROSCOPY N/A 11/27/2022   Procedure: DIAGNOSTIC LAPAROSCOPY;  Surgeon: Stark Klein, MD;  Location: [place];  Service: General;  Laterality:  N/A;  EPIDURAL   LAPAROTOMY N/A 11/27/2022   Procedure: EXPLORATORY LAPAROTOMY;  Surgeon: Stark Klein, MD;  Location: [place];  Service: General;  Laterality: N/A;   OPERATIVE ULTRASOUND N/A 11/27/2022   Procedure: INTRAOPERATIVE ULTRASOUND;  Surgeon: Stark Klein, MD;  Location: [place];  Service: General;  Laterality: N/A;   PORTACATH PLACEMENT Left 07/31/2022   Procedure: INSERTION PORT-A-CATH;  Surgeon: Stark Klein, MD;  Location: [place];  Service: General;  Laterality: Left;   PROSTATECTOMY     REMOVAL OF STONES  08/23/2022   Procedure: REMOVAL OF STONES;  Surgeon: Irving Copas., MD;  Location[facility];  Service: Gastroenterology;;   Joan Mayans  08/23/2022   Procedure: Joan Mayans;  Surgeon: Irving Copas., MD;  Location: [facility];  Service: Gastroenterology;;   TONSILLECTOMY      Allergies: Pseudoephedrine and Lupron [leuprolide]  Medications: Prior to Admission medications   Medication Sig Start Date End Date Taking? Authorizing Provider  acetaminophen (TYLENOL) 325 MG tablet Take 325 mg by mouth daily as needed for headache.   Yes [provider]  brimonidine (ALPHAGAN) 0.15 % ophthalmic solution Place 1 drop into the left eye in the morning and at bedtime.   Yes [provider]  Carboxymethylcell-Glycerin PF (REFRESH OPTIVE PF) 0.5-0.9 % SOLN Place 1 drop into both eyes as needed (dry eyes). 04/28/18  Yes [provider]  cholecalciferol (VITAMIN D3) 25 MCG (1000 UNIT) tablet Take 1,000 Units by mouth daily.   Yes [provider]  diphenhydrAMINE (BENADRYL) 25 mg capsule Take 1 capsule (25 mg total) by mouth every 6 (six) hours as needed (left leg erythema). 01/11/23  Yes Nita Sells, MD  Dorzolamide HCl-Timolol Mal PF 2-0.5 % SOLN Place 1 drop into the left eye 2 (two) times daily. 12/01/21  Yes [provider]  fentaNYL (DURAGESIC) 100 MCG/HR Place 1 patch onto the skin every 3 (three) days. 01/17/23   Yes Ladell Pier, MD  HYDROmorphone (DILAUDID) 4 MG tablet Take 2-3 tablets (8-12 mg total) by mouth every 4 (four) hours as needed for severe pain. 01/22/23  Yes Ladell Pier, MD  lidocaine-prilocaine (EMLA) cream Apply 1 Application topically as needed. 12/11/22  Yes Ladell Pier, MD  olopatadine (PATANOL) 0.1 % ophthalmic solution Place 1 drop into both eyes daily as needed for allergies.   Yes [provider]  ondansetron (ZOFRAN) 8 MG tablet Take 1 tablet (8 mg total) by mouth every 8 (eight) hours as needed for nausea or vomiting (Starting day 3 after chemo as needed for nausea(received Aloxi with chemo)). 09/10/22  Yes Ladell Pier, MD  pantoprazole (PROTONIX) 40 MG tablet Take 1 tablet (40 mg total) by mouth 2 (two) times daily. 12/06/22  Yes Owens Shark, NP  Polyethylene Glycol 3350 (MIRALAX PO) Take 17 g by mouth daily as needed (constipation).   Yes [provider]  predniSONE (DELTASONE) 10 MG tablet Take 1 tablet (  10 mg total) by mouth daily with breakfast. 01/17/23  Yes Ladell Pier, MD  sodium chloride (AYR) 0.65 % nasal spray Place 2-3 sprays into the nose as needed for congestion.   Yes [provider]  prochlorperazine (COMPAZINE) 10 MG tablet Take 1 tablet (10 mg total) by mouth every 6 (six) hours as needed for nausea or vomiting. Patient not taking: Reported on 01/17/2023 10/07/22   Eugenie Filler, MD  tiZANidine (ZANAFLEX) 4 MG tablet Take 1 tablet (4 mg total) by mouth every 8 (eight) hours as needed for muscle spasms. Patient not taking: Reported on 01/17/2023 11/30/22   Stark Klein, MD  Triptorelin Pamoate 11.25 MG SUSR Inject 11.25 mg into the muscle as needed (PSA). Patient not taking: Reported on 01/17/2023    [provider]     Family History  Problem Relation Age of Onset   Pancreatic cancer Mother 39   Kidney cancer Mother 56   Thyroid cancer Mother 56   Prostate cancer Father 67   Cancer Sister 50        carcinoid tumor   Prostate cancer Brother 20   Prostate cancer Brother 31   Thyroid cancer Brother 62   Prostate cancer Brother 38   Prostate cancer Brother 18   Pancreatic cancer Paternal Aunt 77   Pancreatic cancer Paternal Aunt 52   Prostate cancer Paternal Uncle 63   Prostate cancer Paternal Uncle 35   Cervical cancer Maternal Grandmother 68   Prostate cancer Paternal Grandfather 54   Colon cancer Neg Hx    Stomach cancer Neg Hx    Esophageal cancer Neg Hx    Colon polyps Neg Hx    Rectal cancer Neg Hx     Social History   Socioeconomic History   Marital status: Married    Spouse name: Not on file   Number of children: 1   Years of education: Not on file   Highest education level: Not on file  Occupational History   Occupation: retired  Tobacco Use   Smoking status: Former    Packs/day: 0.50    Years: 20.00    Additional pack years: 0.00    Total pack years: 10.00    Types: Cigarettes    Quit date: 10/29/1981    Years since quitting: 41.2   Smokeless tobacco: Never  Vaping Use   Vaping Use: Never used  Substance and Sexual Activity   Alcohol use: No   Drug use: No   Sexual activity: Yes  Other Topics Concern   Not on file  Social History Narrative   Married to [employer]. Retired. 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            Social Determinants of Health   Financial Resource Strain: Low Risk  (07/26/2022)   Overall Financial Resource Strain (CARDIA)    Difficulty of Paying Living Expenses: Not hard at all  Food Insecurity: No Food Insecurity (01/08/2023)   Hunger Vital Sign    Worried About Running Out of Food in the Last Year: Never true    Ran Out of Food in the Last Year: Never true  Transportation Needs: No Transportation Needs (01/08/2023)   PRAPARE - Hydrologist (Medical): No    Lack of Transportation (Non-Medical): No  Physical Activity: Not on file   Stress: Not on file  Social Connections: Socially Integrated (07/26/2022)   Social  Connection and Isolation Panel [NHANES]    Frequency of Communication with Friends and Family: More than three times a week    Frequency of Social Gatherings with Friends and Family: More than three times a week    Attends Religious Services: More than 4 times per year    Active Member of Clubs or Organizations: Yes    Attends Music therapist: More than 4 times per year    Marital Status: Married     Review of Systems: A 12 point ROS discussed and pertinent positives are indicated in the HPI above.  All other systems are negative.  Review of Systems  Constitutional:  Negative for fatigue and fever.  Respiratory:  Negative for cough and shortness of breath.   Cardiovascular:  Negative for chest pain.  Gastrointestinal:  Positive for abdominal pain and nausea. Negative for vomiting.  Musculoskeletal:  Negative for back pain.  Psychiatric/Behavioral:  Negative for behavioral problems and confusion.     Vital Signs: BP 121/89   Pulse 76   Temp (!) 97.3 F (36.3 C) (Temporal)   Resp 17   Ht 6' (1.829 m)   Wt 196 lb (88.9 kg)   SpO2 98%   BMI 26.58 kg/m   Physical Exam Vitals and nursing note reviewed.  Constitutional:      General: He is not in acute distress.    Appearance: Normal appearance. He is not ill-appearing.  HENT:     Mouth/Throat:     Mouth: Mucous membranes are moist.     Pharynx: Oropharynx is clear.  Cardiovascular:     Rate and Rhythm: Normal rate and regular rhythm.  Pulmonary:     Effort: Pulmonary effort is normal.     Breath sounds: Normal breath sounds.  Abdominal:     General: Abdomen is flat.     Palpations: Abdomen is soft.  Skin:    General: Skin is warm and dry.  Neurological:     General: No focal deficit present.     Mental Status: He is alert and oriented to person, place, and time. Mental status is at baseline.  Psychiatric:        Mood  and Affect: Mood normal.        Behavior: Behavior normal.        Thought Content: Thought content normal.        Judgment: Judgment normal.      MD Evaluation Airway: WNL Heart: WNL Abdomen: WNL Chest/ Lungs: WNL ASA  Classification: 3 Mallampati/Airway Score: Two   Imaging: US Venous Img Lower Unilateral Left  Result Date: 01/07/2023 CLINICAL DATA:  leg swelling, access for blood clot EXAM: LEFT LOWER EXTREMITY VENOUS DOPPLER ULTRASOUND TECHNIQUE: Gray-scale sonography with compression, as well as color and duplex ultrasound, were performed to evaluate the deep venous system(s) from the level of the common femoral vein through the popliteal and proximal calf veins. COMPARISON:  None Available. FINDINGS: VENOUS Normal compressibility of the common femoral, superficial femoral, and popliteal veins, as well as the visualized calf veins. Visualized portions of profunda femoral vein and great saphenous vein unremarkable. No filling defects to suggest DVT on grayscale or color Doppler imaging. Doppler waveforms show normal direction of venous flow, normal respiratory plasticity and response to augmentation. Limited views of the contralateral common femoral vein are unremarkable. OTHER Incidentally noted left inguinal lymph node measuring 2.5 by 1.1 cm. IMPRESSION: No evidence of DVT in the left lower extremity. Electronically Signed   By: Margaretha Sheffield  M.D.   On: 01/07/2023 19:02   DG Chest Port 1 View  Result Date: 01/07/2023 CLINICAL DATA:  Questionable sepsis EXAM: PORTABLE CHEST 1 VIEW COMPARISON:  Chest x-ray dated October 02, 2022 FINDINGS: Cardiac and mediastinal contours are unchanged. Left chest wall port is unchanged in position. Lungs are clear. No pleural effusion or pneumothorax. IMPRESSION: No active disease. Electronically Signed   By: Yetta Glassman M.D.   On: 01/07/2023 18:41    Labs:  CBC: Recent Labs    01/08/23 0644 01/09/23 0439 01/10/23 0535 01/17/23 0900   WBC 6.4 4.7 3.5* 8.5  HGB 10.0* 10.8* 10.0* 12.3*  HCT 30.4* 32.4* 30.7* 35.9*  PLT 123* 143* 127* 225    COAGS: Recent Labs    10/02/22 0655 11/22/22 0941 01/07/23 1815  INR  --  1.0 1.1  APTT 22*  --  36    BMP: Recent Labs    01/10/23 0535 01/12/23 0920 01/13/23 0824 01/17/23 0900  NA 133* 128* 133* 135  K 3.5 3.1* 3.7 3.9  CL 99 95* 98 99  CO2 26 24 27 30   GLUCOSE 102* 128* 137* 149*  BUN 5* 7* 9 9  CALCIUM 8.3* 8.8* 8.6* 9.6  CREATININE 0.55* 0.63 0.57* 0.57*  GFRNONAA >60 >60 >60 >60    LIVER FUNCTION TESTS: Recent Labs    01/08/23 0644 01/09/23 0439 01/10/23 0535 01/17/23 0900  BILITOT 0.7 0.7 0.7 0.5  AST 22 22 21 20   ALT 12 13 13 27   ALKPHOS 52 63 58 72  PROT 5.1* 5.9* 5.5* 6.2*  ALBUMIN 2.8* 3.0* 2.8* 3.8    TUMOR MARKERS: No results for input(s): "AFPTM", "CEA", "CA199", "CHROMGRNA" in the last 8760 hours.  Assessment and Plan: Patient with past medical history of metastatic pancreatic cancer presents with complaint of incomplete pain control, ongoing abdominal pain and poor PO intake.  IR consulted for neurolysis at the request of Dr. Learta Codding and Dr. Barry Dienes. Case reviewed by Dr. Serafina Royals who approves patient for procedure.  Patient presents today in their usual state of health.  He has been NPO and is not currently on blood thinners.   Risks and benefits discussed with the patient including bleeding, infection, damage to adjacent structures, bowel perforation/fistula connection, and sepsis.  All of the patient's questions were answered, patient is agreeable to proceed. Consent signed and in chart.  Thank you for this interesting consult.  I greatly enjoyed meeting [employer] and look forward to participating in their care.  A copy of this report was sent to the requesting provider on this date.  Electronically Signed: Docia Barrier, PA 01/28/2023, 10:14 AM   I spent a total of   25 Minutes in face to face in clinical  consultation, greater than 50% of which was counseling/coordinating care for metastatic pancreatic cancer.

## 2023-01-28 NOTE — Procedures (Signed)
Interventional Radiology Procedure Note  Procedure: CT guided celiac plexus neurolysis  Findings: Please refer to procedural dictation for full description.  Complications: None immediate  Estimated Blood Loss: < 5 mL  Recommendations: Pain control as needed. Follow up with IR as needed.   Ruthann Cancer, MD

## 2023-01-28 NOTE — Discharge Instructions (Signed)
CALL RADIOLOGY DEPARTMENT IF ANY PROBLEMS, QUESTIONS, OR CONCERNS; MAY REMOVE BANDAID TOMORROW AND SHOWER; CALL IF ANY SIGNS OF INFECTION AT SITE, REDNESS, DRAINAGE, FEVER, PAIN, BLEEDING OR SWELLING AT SITE

## 2023-01-28 NOTE — Progress Notes (Addendum)
Received Dilaudid and client refused; Dilaudid returned to [facility] to [facility], Pharmacist; asked Dwayne if I needed to sign anything and he stated '"she said she couldn't find the paper" but she would be able to return medication;client sleeping

## 2023-01-28 NOTE — Progress Notes (Signed)
Left upper chest PAC accessed with #20x1" x90degree huber needle with good blood return and flushed easily

## 2023-01-29 ENCOUNTER — Encounter: Payer: Self-pay | Admitting: Nurse Practitioner

## 2023-01-29 ENCOUNTER — Other Ambulatory Visit: Payer: Self-pay

## 2023-01-29 ENCOUNTER — Inpatient Hospital Stay: Payer: Medicare HMO | Attending: Oncology | Admitting: Nurse Practitioner

## 2023-01-29 VITALS — BP 125/83 | HR 92 | Temp 98.1°F | Resp 17 | Wt 201.5 lb

## 2023-01-29 DIAGNOSIS — R530 Neoplastic (malignant) related fatigue: Secondary | ICD-10-CM

## 2023-01-29 DIAGNOSIS — C251 Malignant neoplasm of body of pancreas: Secondary | ICD-10-CM | POA: Diagnosis not present

## 2023-01-29 DIAGNOSIS — G893 Neoplasm related pain (acute) (chronic): Secondary | ICD-10-CM | POA: Diagnosis not present

## 2023-01-29 DIAGNOSIS — K5903 Drug induced constipation: Secondary | ICD-10-CM

## 2023-01-29 DIAGNOSIS — Z515 Encounter for palliative care: Secondary | ICD-10-CM | POA: Diagnosis not present

## 2023-01-29 DIAGNOSIS — Z7189 Other specified counseling: Secondary | ICD-10-CM

## 2023-01-29 DIAGNOSIS — R630 Anorexia: Secondary | ICD-10-CM

## 2023-01-29 DIAGNOSIS — R110 Nausea: Secondary | ICD-10-CM

## 2023-01-29 MED ORDER — PANTOPRAZOLE SODIUM 40 MG PO TBEC
40.0000 mg | DELAYED_RELEASE_TABLET | Freq: Two times a day (BID) | ORAL | 1 refills | Status: DC
Start: 2023-01-29 — End: 2023-03-15

## 2023-01-29 MED ORDER — FENTANYL 100 MCG/HR TD PT72
1.0000 | MEDICATED_PATCH | TRANSDERMAL | 0 refills | Status: DC
Start: 2023-01-29 — End: 2023-03-15

## 2023-01-29 NOTE — Patient Instructions (Signed)
Jared [name],   Thank you for allowing Palliative to be involved in your care in collaboration with your medical team.   Today we discussed several things during your visit.   Increase hydromorphone (Dilaudid) frequency to 6-8 hours AS NEEDED for moderate to severe pain. Do not set an alarm to take medications on a schedule. Make every attempt to listen to your body and allow your pain levels to assist in medication need. The hopes is pain will improve after recent nerve block allowing the medical team to decrease amount of pain medication use.  2. Increase activity slowly. Try to get up and move around at minimum every 2 hours to assist with fatigue      and ability to allow body to re-adjust given recent events.  3. Focus on small frequent meals versus 3 large meals daily. Snack throughout the day on high protein foods such as peanut butter, nuts, fruits, protein shakes, smoothies.  4. Continue with daily colace and miralax. Do not go more than 2 days without a bowel movement.   If symptoms worsen please do not hesitate to reach out to the medical team for assistance.

## 2023-01-29 NOTE — Progress Notes (Signed)
Jared [name]  [phone] [phone]   Name: Jared [name] Date: 01/29/2023 MRN: [id number]  DOB: [birth year]  Patient Care Team: [name], MD as PCP - General (Family Medicine) Irine Seal, MD (Urology) Algie Coffer Mindi Slicker, RN as Oncology Nurse Navigator    REASON FOR CONSULTATION: Jared [name] is a 77 y.o. male with oncologic medical history including pancreatic cancer (06/2022) on chemotherapy, prostate cancer (2008), anemia of chronic disease, GERD, CKD, and hyperlipidemia. Palliative ask to see for symptom management and goals of care.    SOCIAL HISTORY:     reports that he quit smoking about 41 years ago. His smoking use included cigarettes. He has a 10.00 pack-year smoking history. He has never used smokeless tobacco. He reports that he does not drink alcohol and does not use drugs.  ADVANCE DIRECTIVES:  Patient has advanced directives on file   CODE STATUS: FULL CODE  PAST MEDICAL HISTORY: Past Medical History:  Diagnosis Date   Aortic atherosclerosis 10/02/2022   Arthritis    SHOULDER & KNEES   Bilateral cataracts 10/02/2022   Cataract    bilateral sx   Choledocholithiasis 07/23/2022   Chronic kidney disease    pt states he does not have kidney disease, just a hx of kidney stones   GERD 02/22/2009   Qualifier: Diagnosis of   By: Carlean Purl MD, Tonna Boehringer E      Glaucoma 10/02/2022   History of kidney stones    Hyperlipidemia 09/06/2021   The 10-year ASCVD risk score (Arnett DK, et al., 2019) is: 12.5%    Values used to calculate the score:      Age: 30 years      Sex: Male      Is Non-Hispanic African American: Yes      Diabetic: No      Tobacco smoker: No      Systolic Blood Pressure: 0000000 mmHg      Is BP treated: No      HDL Cholesterol: 82 mg/dL      Total Cholesterol: 241 mg/dL      Inguinal hernia    right   Leukocytosis 10/02/2022   Pancreatic cancer 2023   Prostate cancer 10/29/1988     PAST SURGICAL HISTORY:  Past Surgical History:  Procedure Laterality Date   CATARACT EXTRACTION Bilateral    CHOLECYSTECTOMY N/A 07/14/2013   Procedure: LAPAROSCOPIC CHOLECYSTECTOMY;  Surgeon: Stark Klein, MD;  Location: [facility];  Service: General;  Laterality: N/A;   COLONOSCOPY  01/07/2004   Dr. Silvano Rusk   COLONOSCOPY  02/2022   CG-MAC-mira(good)-tics-2 polyps   ENDOSCOPIC RETROGRADE CHOLANGIOPANCREATOGRAPHY (ERCP) WITH PROPOFOL N/A 08/23/2022   Procedure: ENDOSCOPIC RETROGRADE CHOLANGIOPANCREATOGRAPHY (ERCP) WITH PROPOFOL;  Surgeon: Irving Copas., MD;  Location: [facility];  Service: Gastroenterology;  Laterality: N/A;   ESOPHAGOGASTRODUODENOSCOPY (EGD) WITH PROPOFOL N/A 07/05/2022   Procedure: ESOPHAGOGASTRODUODENOSCOPY (EGD) WITH PROPOFOL;  Surgeon: Rush Landmark Telford Nab., MD;  Location: [place];  Service: Gastroenterology;  Laterality: N/A;   EUS N/A 07/05/2022   Procedure: UPPER ENDOSCOPIC ULTRASOUND (EUS) RADIAL;  Surgeon: Irving Copas., MD;  Location: [place];  Service: Gastroenterology;  Laterality: N/A;   EYE SURGERY     FINE NEEDLE ASPIRATION  07/05/2022   Procedure: FINE NEEDLE ASPIRATION (FNA) LINEAR;  Surgeon: Irving Copas., MD;  Location: [place];  Service: Gastroenterology;;   INGUINAL HERNIA REPAIR Right    LAPAROSCOPY N/A 11/27/2022   Procedure: DIAGNOSTIC LAPAROSCOPY;  Surgeon:  Stark Klein, MD;  Location: [place];  Service: General;  Laterality: N/A;  EPIDURAL   LAPAROTOMY N/A 11/27/2022   Procedure: EXPLORATORY LAPAROTOMY;  Surgeon: Stark Klein, MD;  Location: [place];  Service: General;  Laterality: N/A;   OPERATIVE ULTRASOUND N/A 11/27/2022   Procedure: INTRAOPERATIVE ULTRASOUND;  Surgeon: Stark Klein, MD;  Location: [place];  Service: General;  Laterality: N/A;   PORTACATH PLACEMENT Left 07/31/2022   Procedure: INSERTION PORT-A-CATH;  Surgeon: Stark Klein, MD;  Location: [place];  Service: General;  Laterality: Left;    PROSTATECTOMY     REMOVAL OF STONES  08/23/2022   Procedure: REMOVAL OF STONES;  Surgeon: Rush Landmark Telford Nab., MD;  Location: [facility];  Service: Gastroenterology;;   Joan Mayans  08/23/2022   Procedure: SPHINCTEROTOMY;  Surgeon: Mansouraty, Telford Nab., MD;  Location: [facility];  Service: Gastroenterology;;   TONSILLECTOMY      HEMATOLOGY/ONCOLOGY HISTORY:  Oncology History  Cancer of pancreas, body  07/17/2022 Initial Diagnosis   Cancer of pancreas, body ([place])   07/17/2022 Cancer Staging   Staging form: Exocrine Pancreas, AJCC 8th Edition - Clinical: Stage IB (cT2, cN0, cM0) - Signed by Ladell Pier, MD on 07/17/2022 Total positive nodes: 0   08/01/2022 - 09/28/2022 Chemotherapy   Patient is on Treatment Plan : PANCREAS Modified FOLFIRINOX q14d x 4 cycles     08/10/2022 Genetic Testing   Negative genetic testing.  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  Report date is 08/10/2022.   The CancerNext-Expanded gene panel offered by [facility] and includes sequencing, rearrangement, and RNA analysis for the following 77 genes: AIP, ALK, APC, ATM, AXIN2, BAP1, BARD1, BLM, BMPR1A, BRCA1, BRCA2, BRIP1, CDC73, CDH1, CDK4, CDKN1B, CDKN2A, CHEK2, CTNNA1, DICER1, FANCC, FH, FLCN, GALNT12, KIF1B, LZTR1, MAX, MEN1, MET, MLH1, MSH2, MSH3, MSH6, MUTYH, NBN, NF1, NF2, NTHL1, PALB2, PHOX2B, PMS2, POT1, PRKAR1A, PTCH1, PTEN, RAD51C, RAD51D, RB1, RECQL, RET, SDHA, SDHAF2, SDHB, SDHC, SDHD, SMAD4, SMARCA4, SMARCB1, SMARCE1, STK11, SUFU, TMEM127, TP53, TSC1, TSC2, VHL and XRCC2 (sequencing and deletion/duplication); EGFR, EGLN1, HOXB13, KIT, MITF, PDGFRA, POLD1, and POLE (sequencing only); EPCAM and GREM1 (deletion/duplication only).    12/13/2022 -  Chemotherapy   Patient is on Treatment Plan : PANCREATIC Abraxane D1,8,15 + Gemcitabine D1,8,15 q28d       ALLERGIES:  is allergic to pseudoephedrine and lupron [leuprolide].  MEDICATIONS:  Current  Outpatient Medications  Medication Sig Dispense Refill   acetaminophen (TYLENOL) 325 MG tablet Take 325 mg by mouth daily as needed for headache.     brimonidine (ALPHAGAN) 0.15 % ophthalmic solution Place 1 drop into the left eye in the morning and at bedtime.     Carboxymethylcell-Glycerin PF (REFRESH OPTIVE PF) 0.5-0.9 % SOLN Place 1 drop into both eyes as needed (dry eyes).     cholecalciferol (VITAMIN D3) 25 MCG (1000 UNIT) tablet Take 1,000 Units by mouth daily.     diphenhydrAMINE (BENADRYL) 25 mg capsule Take 1 capsule (25 mg total) by mouth every 6 (six) hours as needed (left leg erythema). 30 capsule 0   Dorzolamide HCl-Timolol Mal PF 2-0.5 % SOLN Place 1 drop into the left eye 2 (two) times daily.     fentaNYL (DURAGESIC) 100 MCG/HR Place 1 patch onto the skin every 3 (three) days. 5 patch 0   HYDROmorphone (DILAUDID) 4 MG tablet Take 2-3 tablets (8-12 mg total) by mouth every 4 (four) hours as needed for severe pain. 150 tablet 0   lidocaine-prilocaine (EMLA) cream Apply  1 Application topically as needed. 30 g 2   olopatadine (PATANOL) 0.1 % ophthalmic solution Place 1 drop into both eyes daily as needed for allergies.     ondansetron (ZOFRAN) 8 MG tablet Take 1 tablet (8 mg total) by mouth every 8 (eight) hours as needed for nausea or vomiting (Starting day 3 after chemo as needed for nausea(received Aloxi with chemo)). 30 tablet 1   pantoprazole (PROTONIX) 40 MG tablet Take 1 tablet (40 mg total) by mouth 2 (two) times daily. 60 tablet 1   Polyethylene Glycol 3350 (MIRALAX PO) Take 17 g by mouth daily as needed (constipation).     predniSONE (DELTASONE) 10 MG tablet Take 1 tablet (10 mg total) by mouth daily with breakfast. 90 tablet 0   prochlorperazine (COMPAZINE) 10 MG tablet Take 1 tablet (10 mg total) by mouth every 6 (six) hours as needed for nausea or vomiting. (Patient not taking: Reported on 01/17/2023) 30 tablet 1   sodium chloride (AYR) 0.65 % nasal spray Place 2-3 sprays  into the nose as needed for congestion.     tiZANidine (ZANAFLEX) 4 MG tablet Take 1 tablet (4 mg total) by mouth every 8 (eight) hours as needed for muscle spasms. (Patient not taking: Reported on 01/17/2023) 30 tablet 0   Triptorelin Pamoate 11.25 MG SUSR Inject 11.25 mg into the muscle as needed (PSA). (Patient not taking: Reported on 01/17/2023)     No current facility-administered medications for this visit.    VITAL SIGNS: BP 125/83 (BP Location: Right Arm, Patient Position: Sitting)   Pulse 92   Temp 98.1 F (36.7 C) (Oral)   Resp 17   Wt 91.4 kg   SpO2 99%   BMI 27.33 kg/m  Filed Weights   01/29/23 1320  Weight: 91.4 kg    Estimated body mass index is 27.33 kg/m as calculated from the following:   Height as of 01/28/23: 6' (1.829 m).   Weight as of this encounter: 91.4 kg.  LABS: CBC:    Component Value Date/Time   WBC 6.4 01/28/2023 0958   HGB 11.5 (L) 01/28/2023 0958   HGB 12.3 (L) 01/17/2023 0900   HGB 14.7 08/30/2020 0922   HCT 36.3 (L) 01/28/2023 0958   HCT 45.7 08/30/2020 0922   PLT 241 01/28/2023 0958   PLT 225 01/17/2023 0900   PLT 215 08/30/2020 0922   MCV 87.5 01/28/2023 0958   MCV 86 08/30/2020 0922   NEUTROABS 5.4 01/17/2023 0900   NEUTROABS 2.0 08/30/2020 0922   LYMPHSABS 1.4 01/17/2023 0900   LYMPHSABS 1.0 08/30/2020 0922   MONOABS 1.4 (H) 01/17/2023 0900   EOSABS 0.3 01/17/2023 0900   EOSABS 0.1 08/30/2020 0922   BASOSABS 0.0 01/17/2023 0900   BASOSABS 0.1 08/30/2020 0922   Comprehensive Metabolic Panel:    Component Value Date/Time   NA 135 01/17/2023 0900   NA 141 10/16/2022 1047   K 3.9 01/17/2023 0900   CL 99 01/17/2023 0900   CO2 30 01/17/2023 0900   BUN 9 01/17/2023 0900   BUN 8 10/16/2022 1047   CREATININE 0.57 (L) 01/17/2023 0900   CREATININE 0.77 08/24/2015 1137   GLUCOSE 149 (H) 01/17/2023 0900   CALCIUM 9.6 01/17/2023 0900   AST 20 01/17/2023 0900   ALT 27 01/17/2023 0900   ALKPHOS 72 01/17/2023 0900   BILITOT 0.5  01/17/2023 0900   PROT 6.2 (L) 01/17/2023 0900   ALBUMIN 3.8 01/17/2023 0900    RADIOGRAPHIC STUDIES:   PERFORMANCE STATUS (ECOG) :  1 - Symptomatic but completely ambulatory  Review of Systems  Constitutional:  Positive for appetite change, fatigue and unexpected weight change.  Gastrointestinal:  Positive for abdominal pain.  Musculoskeletal:  Positive for arthralgias.  Unless otherwise noted, a complete review of systems is negative.  Physical Exam General: NAD HEENT: white coating/thrush to tongue  Cardiovascular: regular rate and rhythm Pulmonary: clear ant fields Abdomen: soft, nontender, + bowel sounds Extremities: 1+ edema to bilateral ankles, no joint deformities Skin: no rashes Neurological: alert, oriented, and engaging  IMPRESSION: This is Mr. Lenhard initial visit outpatient. He was initially seen by our team during recent hospitalization and requested ongoing support and follow-up at discharge. Present for visit are patient, his wife Sunday Spillers of 54 years, and daughter Maudie Mercury. Maudie Mercury is an only child and lives in [place], [place]. She visits [place] frequently to check on her parents and travels out of the country for her job. 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 He balances this with a love for the outdoors, gardening, and canning.    I introduced myself, Nikki NP, Maygan RN, and Palliative's role in collaboration with the oncology team. Concept of Palliative Care was introduced as specialized medical care for people and their families living with serious illness.  It focuses on providing relief from the symptoms and stress of a serious illness.  The goal is to improve quality of life for both the patient and the family. Values and goals of care important to patient and family were attempted to be elicited.   Goals of Care We discussed Mr. Kolden current illness and what it means in the larger context of his on-going co-morbidities. Natural disease  trajectory and expectations were discussed. We discussed the importance of continued conversation with family and their medical providers regarding overall plan of care and treatment options, ensuring decisions are within the context of the patients values and GOCs.  Mr. Beaulieu stated goal is to be more alert, be able to participate in more activities, and take as little medication as possible. He is remaining hopeful for improvement in symptoms resulting in an improvement in his quality of life which is most important. He and family are realistic in their understanding of Scot's current condition. Clearly expressed goals to continue to treat the treatable allowing him every opportunity to continue to thrive.   Mr. Goldschmidt and family confirms advance directives that are on file. Document has been personally reviewed.    2.   Pain Mr. Lutzow endorses constant "tingling" pain across his lower abdomen. Reports step-up in his medication regimen about 2 weeks ago. At that time, pain was severe and his Fentanyl patch was increased to 100 mcg/hr and Dilaudid to 12 mg PO q 4 hours PRN. Patient has been taking the 12 mg of Dilaudid around-the-clock. He sets his clock to take during the night, but otherwise sleeps well. Reports napping some during the day as well.   Mr. Stavig reports that he had a nerve-block procedure performed yesterday and is hopeful that this will put him on a path to decreasing his medication usage. Wife expresses concern that Mr. Both will become addicted to pain medication. Active listening provided and validated concern. Education provided that patient is currently under the care of medical professionals and following medication orders as prescribed. Reassured that palliative team in collaboration with his Oncology team can can help escalate or wean medications as appropriate to patient's disease process and symptom burden. Patient and family  verbalize understanding and relief.  He rates  his current pain as 1/10. This has not been the usual case and he took medications prior to visit. We discussed realistic expectations of pain controlled with understanding of gaining ability to be active and able to manage pain without having to medicate around the clock.   We discussed use of "as needed" medication. I have advised patient and family to focus on his body and how he is feeling versus looking at the clock and setting alarms. If he is able to go longer than prescribed time frame this is ok. Also encouraged not to awaken during the night to take medication and allow for restful uninterrupted sleep unless pain becomes a factor. Education provided on hopes of improvement since nerve block and using medications as needed to allow for complete assessment of effectiveness. Patient and family verbalized understanding and appreciation of discussions.   Plan made with patient to decease Hydromorphone usage to 8-12 mg PO every 6-8 hours PRN pain. Will continue fentanyl patch at this time however if pain improves the goal would be to decrease while closely assessing pain needs.   I discussed not overdoing things around the home but with understanding of importance to get up and moving every few hours to combat fatigue. Education provided on during activity it is ok to stop and take a break allowing time to recover along with directed meditation and deep breathing.   We will continue to closely monitor and support as needed.   3.  Constipation Endorses irregular bowel patterns. Reports when he has gone 2- 3 days without a bowel movement, takes Colace then Miralax mixed in prune juice if needed.  Education provided that opioids frequently induce constipation. Encouraged to stay ahead of constipation rather than responding to it. Encouraged to take Senna on a daily basis and laxatives on an as-needed basis.    4.  Nausea/vomiting/decreased appetite Mr. Filmore endorses nausea and "spitting up" in the  day or two following chemotherapy treatments. He is currently on Protonix for his GERD. He has both PRN Zofran and Compazine available for nausea; primarily takes the Zofran with partial relief. Protonix as prescribed.   Education provided regarding the alternation of Zofran and Compazine for nausea and patient verbalizes appreciation for this information.   Kedarius endorses early satiety. Also some days with minimal desire to eat in correlation to pain. We discussed increasing protein. Continuing boost shakes, making home smoothies, and focusing on small frequent meals versus 3 large meals daily.   He continues to struggle with oral thrush. Use of MMW. Will defer additional treatment and management to Oncology team whom he is scheduled to see tomorrow.   PLAN: Established therapeutic relationship. Education provided on palliative's role in collaboration with their Oncology team. Continue Fentanyl 100 mcg/hr patch. Decrease Hydromorphone usage to 8-12 mg every 6 hours PRN - with goal of not setting alarm clock to wake up during the night or throughout the day. Listening to his body and rating pain to allow for better control.  Take Senna on a daily basis for constipation. Miralax daily Magic mouthwash as prescribed.  Alternate PRN Zofran and Compazine to manage CINV. Education provided on nutrition. Palliative will plan to see patient back in 2-4 weeks in collaboration to other oncology appointments. We will plan phone follow-up on Friday.    Patient expressed understanding and was in agreement with this plan. He also understands that He can call the clinic at any time with any questions,  concerns, or complaints.   Thank you for your referral and allowing Palliative to assist in Mr. Dontavian Auslander Barot's care.   Number and complexity of problems addressed: HIGH - 1 or more chronic illnesses with SEVERE exacerbation, progression, or side effects of treatment - advanced cancer, pain. Any controlled  substances utilized were prescribed in the context of palliative care.  I assessed patient with Levada Dy, NP Student. Agree with above findings.   Visit consisted of counseling and education dealing with the complex and emotionally intense issues of symptom management and palliative care in the setting of serious and potentially life-threatening illness.Greater than 50%  of this time was spent counseling and coordinating care related to the above assessment and plan.  Signed by: Moss Mc, RN MSN [facility] / NP Student  Signed by: Alda Lea, AGPCNP-BC Palliative Medicine Team/[place]   *Please note that this is a verbal dictation therefore any spelling or grammatical errors are due to the "[place] [employer]" system interpretation.

## 2023-01-30 ENCOUNTER — Inpatient Hospital Stay: Payer: Medicare HMO

## 2023-01-30 ENCOUNTER — Inpatient Hospital Stay: Payer: Medicare HMO | Admitting: Nutrition

## 2023-01-30 ENCOUNTER — Encounter: Payer: Self-pay | Admitting: *Deleted

## 2023-01-30 ENCOUNTER — Inpatient Hospital Stay: Payer: Medicare HMO | Admitting: Oncology

## 2023-01-30 VITALS — BP 114/78 | HR 66 | Temp 98.2°F | Resp 18 | Ht 72.0 in | Wt 200.6 lb

## 2023-01-30 VITALS — BP 132/86 | HR 59

## 2023-01-30 DIAGNOSIS — C251 Malignant neoplasm of body of pancreas: Secondary | ICD-10-CM

## 2023-01-30 LAB — CBC WITH DIFFERENTIAL (CANCER CENTER ONLY)
Abs Immature Granulocytes: 0.03 10*3/uL (ref 0.00–0.07)
Basophils Absolute: 0.1 10*3/uL (ref 0.0–0.1)
Basophils Relative: 1 %
Eosinophils Absolute: 0.1 10*3/uL (ref 0.0–0.5)
Eosinophils Relative: 1 %
HCT: 34.7 % — ABNORMAL LOW (ref 39.0–52.0)
Hemoglobin: 11.4 g/dL — ABNORMAL LOW (ref 13.0–17.0)
Immature Granulocytes: 0 %
Lymphocytes Relative: 12 %
Lymphs Abs: 0.8 10*3/uL (ref 0.7–4.0)
MCH: 28.1 pg (ref 26.0–34.0)
MCHC: 32.9 g/dL (ref 30.0–36.0)
MCV: 85.5 fL (ref 80.0–100.0)
Monocytes Absolute: 1 10*3/uL (ref 0.1–1.0)
Monocytes Relative: 15 %
Neutro Abs: 5 10*3/uL (ref 1.7–7.7)
Neutrophils Relative %: 71 %
Platelet Count: 198 10*3/uL (ref 150–400)
RBC: 4.06 MIL/uL — ABNORMAL LOW (ref 4.22–5.81)
RDW: 16.2 % — ABNORMAL HIGH (ref 11.5–15.5)
WBC Count: 7 10*3/uL (ref 4.0–10.5)
nRBC: 0 % (ref 0.0–0.2)

## 2023-01-30 LAB — CMP (CANCER CENTER ONLY)
ALT: 15 U/L (ref 0–44)
AST: 19 U/L (ref 15–41)
Albumin: 3.3 g/dL — ABNORMAL LOW (ref 3.5–5.0)
Alkaline Phosphatase: 79 U/L (ref 38–126)
Anion gap: 6 (ref 5–15)
BUN: 10 mg/dL (ref 8–23)
CO2: 29 mmol/L (ref 22–32)
Calcium: 8.8 mg/dL — ABNORMAL LOW (ref 8.9–10.3)
Chloride: 103 mmol/L (ref 98–111)
Creatinine: 0.51 mg/dL — ABNORMAL LOW (ref 0.61–1.24)
GFR, Estimated: >60 mL/min (ref >60)
Glucose, Bld: 139 mg/dL — ABNORMAL HIGH (ref 70–99)
Potassium: 3.8 mmol/L (ref 3.5–5.1)
Sodium: 138 mmol/L (ref 135–145)
Total Bilirubin: 0.5 mg/dL (ref 0.3–1.2)
Total Protein: 5.5 g/dL — ABNORMAL LOW (ref 6.5–8.1)

## 2023-01-30 MED ORDER — FLUCONAZOLE 100 MG PO TABS: 100.0000 mg | ORAL_TABLET | Freq: Every day | ORAL | 0 refills | Status: DC

## 2023-01-30 MED ORDER — PACLITAXEL PROTEIN-BOUND CHEMO INJECTION 100 MG
80.0000 mg/m2 | Freq: Once | INTRAVENOUS | Status: AC
  Administered 2023-01-30: 175 mg via INTRAVENOUS
  Filled 2023-01-30: qty 35

## 2023-01-30 MED ORDER — HEPARIN SOD (PORK) LOCK FLUSH 100 UNIT/ML IV SOLN
500.0000 [IU] | Freq: Once | INTRAVENOUS | Status: AC | PRN
  Administered 2023-01-30: 500 [IU]

## 2023-01-30 MED ORDER — SODIUM CHLORIDE 0.9 % IV SOLN
700.0000 mg/m2 | Freq: Once | INTRAVENOUS | Status: AC
  Administered 2023-01-30: 1520 mg via INTRAVENOUS
  Filled 2023-01-30: qty 26.28

## 2023-01-30 MED ORDER — SODIUM CHLORIDE 0.9 % IV SOLN: Freq: Once | INTRAVENOUS | Status: AC

## 2023-01-30 MED ORDER — PROCHLORPERAZINE MALEATE 10 MG PO TABS
10.0000 mg | ORAL_TABLET | Freq: Once | ORAL | Status: AC
  Administered 2023-01-30: 10 mg via ORAL
  Filled 2023-01-30: qty 1

## 2023-01-30 MED ORDER — SODIUM CHLORIDE 0.9% FLUSH
10.0000 mL | INTRAVENOUS | Status: DC | PRN
  Administered 2023-01-30: 10 mL

## 2023-01-30 NOTE — Progress Notes (Signed)
[place] OFFICE PROGRESS NOTE   Diagnosis: Pancreas cancer  INTERVAL HISTORY:   Jared [name] underwent a CT-guided celiac ablation on 01/28/2023.  He reports significant improvement in abdominal pain.  He is weaning the use of Dilaudid.  His appetite is partially improved.  He is having bowel movements.  He has developed thrush over the tongue.  Objective:  Vital signs in last 24 hours:  Blood pressure 114/78, pulse 66, temperature 98.2 F (36.8 C), temperature source Oral, resp. rate 18, height 6' (1.829 m), weight 200 lb 9.6 oz (91 kg), SpO2 99 %.    HEENT: Thrush over the tongue and buccal mucosa Resp: Lungs clear bilaterally with decreased breath sounds at the right upper posterior chest, no respiratory distress Cardio: Regular rate and rhythm GI: Nontender, no hepatosplenomegaly, no mass Vascular: Trace lower leg edema bilaterally  Skin: Faint hyperpigmentation at the left lower leg  Portacath/PICC-without erythema  Lab Results:  Lab Results  Component Value Date   WBC 7.0 01/30/2023   HGB 11.4 (L) 01/30/2023   HCT 34.7 (L) 01/30/2023   MCV 85.5 01/30/2023   PLT 198 01/30/2023   NEUTROABS 5.0 01/30/2023    CMP  Lab Results  Component Value Date   NA 135 01/17/2023   K 3.9 01/17/2023   CL 99 01/17/2023   CO2 30 01/17/2023   GLUCOSE 149 (H) 01/17/2023   BUN 9 01/17/2023   CREATININE 0.57 (L) 01/17/2023   CALCIUM 9.6 01/17/2023   PROT 6.2 (L) 01/17/2023   ALBUMIN 3.8 01/17/2023   AST 20 01/17/2023   ALT 27 01/17/2023   ALKPHOS 72 01/17/2023   BILITOT 0.5 01/17/2023   GFRNONAA >60 01/17/2023   GFRAA >90 08/10/2013    Lab Results  Component Value Date   [id number] 31,674 (H) 01/17/2023    Lab Results  Component Value Date   INR 1.1 01/28/2023   LABPROT 14.0 01/28/2023    Imaging:  CT CELIAC PLEXUS BLOCK NEUROLYTIC  Result Date: 01/29/2023 CLINICAL DATA:  77 year old male with history of pancreatic cancer and intractable abdominal  pain. Responded favorably to prior celiac nerve block on 11/29/2022. EXAM: CT GUIDED NEUROLYTIC ABLATION OF THE CELIAC AXIS ANESTHESIA/SEDATION: Moderate (conscious) sedation was employed during this procedure. A total of Versed 1 mg and Fentanyl 100 mcg was administered intravenously. Moderate Sedation Time: 20 minutes. The patient's level of consciousness and vital signs were monitored continuously by radiology nursing throughout the procedure under my direct supervision. PROCEDURE: The procedure risks, benefits, and alternatives were explained to the patient. Questions regarding the procedure were encouraged and answered. The patient understands and consents to the procedure. The anterior abdominal wall was prepped with Betadine in a sterile fashion, and a sterile drape was applied covering the operative field. A sterile gown and sterile gloves were used for the procedure. Local anesthesia was provided with 1% Lidocaine. A 22 gauge Chiba needle was advanced under CT guidance to the level of the celiac plexus. After confirming needle tip position, approximately three ml of a 1:10 dilution of Omnipaque-300 contrast and saline was injected. Spread of diluted contrast material was confirmed by CT. A mixture of 3 ml of 0.5% Sensorcaine, 6 mL dehydrated ethanol, and 1 cc of dilute contrast material. This was injected through the needle. Completion CT demonstrated antecrural spread without evidence of complicating features. COMPLICATIONS: None FINDINGS: Contrast injection showed excellent spread across the midline at the level of the celiac plexus. Alcohol ablation was successfully performed. Alcohol injection was stopped when  the patient did start to experience exacerbation of pain. IMPRESSION: CT guided neurolytic ablation of the celiac plexus as above. Alcohol ablation was performed with 6 ml of absolute alcohol. Ruthann Cancer, MD [facility] Electronically  Signed   By: Ruthann Cancer M.D.   On: 01/29/2023 09:29    Medications: I have reviewed the patient's current medications.   Assessment/Plan:  Pancreas cancer FNA biopsy of a pancreas body/tail mass 07/05/2022-adenocarcinoma CT abdomen/pelvis 06/14/2022-hypoenhancing pancreas body/tail mass with effacement of the splenic vein, no evidence of lymphadenopathy or metastatic disease CA 19-9 on 06/18/2022-767 EUS 07/05/2022-a 7 x 29 mm pancreas body/tail mass, abutment of the splenic artery, no malignant appearing lymph nodes, T2N0 by EUS CTs 07/15/2022-pancreas body/tail mass with no evidence of metastatic disease, splenic vein associated with the tumor, no arterial involvement, stable right greater than left lung nodules favored benign Cycle 1 FOLFOX 08/01/2022 Cycle 2 FOLFOX, 08/16/2022, neulasta Cycle 3 FOLFIRINOX 08/29/2022, Neulasta Cycle 4 FOLFIRINOX 09/12/2022, Neulasta, irinotecan and 5-fluorouracil dose reduced due to diarrhea/weight loss Cycle 5 FOLFIRINOX 09/26/2022 CT abdomen/pelvis 10/09/2022-no significant change in the appearance of the ill-defined pancreatic tail mass.  Persistent extrinsic mass effect on the splenic vein which remains patent.  Indeterminate left adrenal nodule. 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  Foundation 1-microsatellite stable, tumor mutation burden 1, K-ras G12R CTs 12/12/2022-increasing size of pancreatic mass with suspected metastatic disease involving the lesser sac and adjacent stomach.  New area of masslike nodularity anterior to the pancreatic neck appears to tether the gastric antrum.  Worsening of peritoneal disease along the inferior surface of the right hepatic margin.  Lesion along the margin of the sigmoid colon is favored to represent a tumor implant.  Enlarging left adrenal gland in close proximity to the  pancreatic lesion.  Scattered more conspicuous areas of peritoneal and omental involvement.  Vascular involvement seen on previous imaging most notably of the splenic vein and splenic artery. Cycle 1 gemcitabine/Abraxane 12/13/2022 Chemotherapy held 12/27/2022 due to mild neutropenia Cycle 2 gemcitabine/Abraxane 01/03/2023 Prostate cancer, status post prostatectomy 1990 Chronic elevation of the PSA-maintained on Trelstar, followed by Dr. Jeffie Pollock   3.  Kidney stones 4.   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  Abdominal pain, secondary to #1 Celiac plexus block 11/29/2022 Celiac plexus alcohol ablation 01/28/2023 6.  Family history of prostate and pancreas cancer 7.  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  Typhlitis in 2014 9.  Colon polyps-tubular adenomas on colonoscopy 03/27/2022 10.  Presentation emergency room 10/01/2022 and 10/02/2022 with nausea/vomiting, diarrhea, and chest pain-esophagitis 11.  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  He completed 2 treatments with gemcitabine/Abraxane.  His pain progressed and the CA 19-9 was higher after cycle 2.  We discussed the indication for continuing gemcitabine/Abraxane.  Jared [name] would like to complete several more cycles prior to making a decision on the effectiveness of the gemcitabine/Abraxane.  He will complete another cycle today.  The chemotherapy is dose reduced secondary to neutropenia.  Jared [name] will return for an office visit and gemcitabine/Abraxane in 2 weeks.  He will complete a course of Diflucan for oral candidiasis.  He will continue follow-up with palliative care medicine for pain management.  Betsy Coder,  MD  01/30/2023  10:26 AM

## 2023-01-30 NOTE — Progress Notes (Signed)
Patient seen by Dr. Sherrill today ? ?Vitals are within treatment parameters. ? ?Labs reviewed by Dr. Sherrill and are within treatment parameters. ? ?Per physician team, patient is ready for treatment and there are NO modifications to the treatment plan.  ?

## 2023-01-30 NOTE — Patient Instructions (Signed)
[name]   Discharge Instructions: Thank you for choosing [facility] to provide your oncology and hematology care.   If you have a lab appointment with the Cancer Center, please go directly to the Cancer Center and check in at the registration area.   Wear comfortable clothing and clothing appropriate for easy access to any Portacath or PICC line.   We strive to give you quality time with your provider. You may need to reschedule your appointment if you arrive late (15 or more minutes).  Arriving late affects you and other patients whose appointments are after yours.  Also, if you miss three or more appointments without notifying the office, you may be dismissed from the clinic at the provider's discretion.      For prescription refill requests, have your pharmacy contact our office and allow 72 hours for refills to be completed.    Today you received the following chemotherapy and/or immunotherapy agents Abraxane, Gemzar.      To help prevent nausea and vomiting after your treatment, we encourage you to take your nausea medication as directed.  BELOW ARE SYMPTOMS THAT SHOULD BE REPORTED IMMEDIATELY: *FEVER GREATER THAN 100.4 F (38 C) OR HIGHER *CHILLS OR SWEATING *NAUSEA AND VOMITING THAT IS NOT CONTROLLED WITH YOUR NAUSEA MEDICATION *UNUSUAL SHORTNESS OF BREATH *UNUSUAL BRUISING OR BLEEDING *URINARY PROBLEMS (pain or burning when urinating, or frequent urination) *BOWEL PROBLEMS (unusual diarrhea, constipation, pain near the anus) TENDERNESS IN MOUTH AND THROAT WITH OR WITHOUT PRESENCE OF ULCERS (sore throat, sores in mouth, or a toothache) UNUSUAL RASH, SWELLING OR PAIN  UNUSUAL VAGINAL DISCHARGE OR ITCHING   Items with * indicate a potential emergency and should be followed up as soon as possible or go to the Emergency Department if any problems should occur.  Please show the CHEMOTHERAPY ALERT CARD or IMMUNOTHERAPY ALERT CARD  at check-in to the Emergency Department and triage nurse.  Should you have questions after your visit or need to cancel or reschedule your appointment, please contact [facility]  Dept: [phone]  and follow the prompts.  Office hours are 8:00 a.m. to 4:30 p.m. Monday - Friday. Please note that voicemails left after 4:00 p.m. may not be returned until the following business day.  We are closed weekends and major holidays. You have access to a nurse at all times for urgent questions. Please call the main number to the clinic Dept: [phone] and follow the prompts.   For any non-urgent questions, you may also contact your provider using MyChart. We now offer e-Visits for anyone 18 and older to request care online for non-urgent symptoms. For details visit [email or web address].   Also download the MyChart app! Go to the app store, search "MyChart", open the app, select [facility], and log in with your MyChart username and password.  Paclitaxel Nanoparticle Albumin-Bound Injection What is this medication? NANOPARTICLE ALBUMIN-BOUND PACLITAXEL (Na no PAHR ti kuhl al BYOO muhn-bound PAK li TAX el) treats some types of cancer. It works by slowing down the growth of cancer cells. This medicine may be used for other purposes; ask your health care provider or pharmacist if you have questions. COMMON BRAND NAME(S): Abraxane What should I tell my care team before I take this medication? They need to know if you have any of these conditions: Liver disease Low white blood cell levels An unusual or allergic reaction to paclitaxel, albumin, other medications, foods, dyes, or preservatives If   you or your partner are pregnant or trying to get pregnant Breast-feeding How should I use this medication? This medication is injected into a vein. It is given by your care team in a hospital or clinic setting. Talk to your care team about the use of this medication in  children. Special care may be needed. Overdosage: If you think you have taken too much of this medicine contact a poison control center or emergency room at once. NOTE: This medicine is only for you. Do not share this medicine with others. What if I miss a dose? Keep appointments for follow-up doses. It is important not to miss your dose. Call your care team if you are unable to keep an appointment. What may interact with this medication? Other medications may affect the way this medication works. Talk with your care team about all of the medications you take. They may suggest changes to your treatment plan to lower the risk of side effects and to make sure your medications work as intended. This list may not describe all possible interactions. Give your health care provider a list of all the medicines, herbs, non-prescription drugs, or dietary supplements you use. Also tell them if you smoke, drink alcohol, or use illegal drugs. Some items may interact with your medicine. What should I watch for while using this medication? Your condition will be monitored carefully while you are receiving this medication. You may need blood work while taking this medication. This medication may make you feel generally unwell. This is not uncommon as chemotherapy can affect healthy cells as well as cancer cells. Report any side effects. Continue your course of treatment even though you feel ill unless your care team tells you to stop. This medication can cause serious allergic reactions. To reduce the risk, your care team may give you other medications to take before receiving this one. Be sure to follow the directions from your care team. This medication may increase your risk of getting an infection. Call your care team for advice if you get a fever, chills, sore throat, or other symptoms of a cold or flu. Do not treat yourself. Try to avoid being around people who are sick. This medication may increase your risk to  bruise or bleed. Call your care team if you notice any unusual bleeding. Be careful brushing or flossing your teeth or using a toothpick because you may get an infection or bleed more easily. If you have any dental work done, tell your dentist you are receiving this medication. Talk to your care team if you or your partner may be pregnant. Serious birth defects can occur if you take this medication during pregnancy and for 6 months after the last dose. You will need a negative pregnancy test before starting this medication. Contraception is recommended while taking this medication and for 6 months after the last dose. Your care team can help you find the option that works for you. If your partner can get pregnant, use a condom during sex while taking this medication and for 3 months after the last dose. Do not breastfeed while taking this medication and for 2 weeks after the last dose. This medication may cause infertility. Talk to your care team if you are concerned about your fertility. What side effects may I notice from receiving this medication? Side effects that you should report to your care team as soon as possible: Allergic reactions--skin rash, itching, hives, swelling of the face, lips, tongue, or throat Dry cough, shortness   of breath or trouble breathing Infection--fever, chills, cough, sore throat, wounds that don't heal, pain or trouble when passing urine, general feeling of discomfort or being unwell Low red blood cell level--unusual weakness or fatigue, dizziness, headache, trouble breathing Pain, tingling, or numbness in the hands or feet Stomach pain, unusual weakness or fatigue, nausea, vomiting, diarrhea, or fever that lasts longer than expected Unusual bruising or bleeding Side effects that usually do not require medical attention (report to your care team if they continue or are bothersome): Diarrhea Fatigue Hair loss Loss of appetite Nausea Vomiting This list may not  describe all possible side effects. Call your doctor for medical advice about side effects. You may report side effects to FDA at 1-800-FDA-1088. Where should I keep my medication? This medication is given in a hospital or clinic. It will not be stored at home. NOTE: This sheet is a summary. It may not cover all possible information. If you have questions about this medicine, talk to your doctor, pharmacist, or health care provider.  2023 Elsevier/Gold Standard (2007-12-06 00:00:00)  Gemcitabine Injection What is this medication? GEMCITABINE (jem SYE ta been) treats some types of cancer. It works by slowing down the growth of cancer cells. This medicine may be used for other purposes; ask your health care provider or pharmacist if you have questions. COMMON BRAND NAME(S): Gemzar, Infugem What should I tell my care team before I take this medication? They need to know if you have any of these conditions: Blood disorders Infection Kidney disease Liver disease Lung or breathing disease, such as asthma or COPD Recent or ongoing radiation therapy An unusual or allergic reaction to gemcitabine, other medications, foods, dyes, or preservatives If you or your partner are pregnant or trying to get pregnant Breast-feeding How should I use this medication? This medication is injected into a vein. It is given by your care team in a hospital or clinic setting. Talk to your care team about the use of this medication in children. Special care may be needed. Overdosage: If you think you have taken too much of this medicine contact a poison control center or emergency room at once. NOTE: This medicine is only for you. Do not share this medicine with others. What if I miss a dose? Keep appointments for follow-up doses. It is important not to miss your dose. Call your care team if you are unable to keep an appointment. What may interact with this medication? Interactions have not been studied. This list  may not describe all possible interactions. Give your health care provider a list of all the medicines, herbs, non-prescription drugs, or dietary supplements you use. Also tell them if you smoke, drink alcohol, or use illegal drugs. Some items may interact with your medicine. What should I watch for while using this medication? Your condition will be monitored carefully while you are receiving this medication. This medication may make you feel generally unwell. This is not uncommon, as chemotherapy can affect healthy cells as well as cancer cells. Report any side effects. Continue your course of treatment even though you feel ill unless your care team tells you to stop. In some cases, you may be given additional medications to help with side effects. Follow all directions for their use. This medication may increase your risk of getting an infection. Call your care team for advice if you get a fever, chills, sore throat, or other symptoms of a cold or flu. Do not treat yourself. Try to avoid being around people   who are sick. This medication may increase your risk to bruise or bleed. Call your care team if you notice any unusual bleeding. Be careful brushing or flossing your teeth or using a toothpick because you may get an infection or bleed more easily. If you have any dental work done, tell your dentist you are receiving this medication. Avoid taking medications that contain aspirin, acetaminophen, ibuprofen, naproxen, or ketoprofen unless instructed by your care team. These medications may hide a fever. Talk to your care team if you or your partner wish to become pregnant or think you might be pregnant. This medication can cause serious birth defects if taken during pregnancy and for 6 months after the last dose. A negative pregnancy test is required before starting this medication. A reliable form of contraception is recommended while taking this medication and for 6 months after the last dose. Talk to  your care team about effective forms of contraception. Do not father a child while taking this medication and for 3 months after the last dose. Use a condom while having sex during this time period. Do not breastfeed while taking this medication and for at least 1 week after the last dose. This medication may cause infertility. Talk to your care team if you are concerned about your fertility. What side effects may I notice from receiving this medication? Side effects that you should report to your care team as soon as possible: Allergic reactions--skin rash, itching, hives, swelling of the face, lips, tongue, or throat Capillary leak syndrome--stomach or muscle pain, unusual weakness or fatigue, feeling faint or lightheaded, decrease in the amount of urine, swelling of the ankles, hands, or feet, trouble breathing Infection--fever, chills, cough, sore throat, wounds that don't heal, pain or trouble when passing urine, general feeling of discomfort or being unwell Liver injury--right upper belly pain, loss of appetite, nausea, light-colored stool, dark yellow or brown urine, yellowing skin or eyes, unusual weakness or fatigue Low red blood cell level--unusual weakness or fatigue, dizziness, headache, trouble breathing Lung injury--shortness of breath or trouble breathing, cough, spitting up blood, chest pain, fever Stomach pain, bloody diarrhea, pale skin, unusual weakness or fatigue, decrease in the amount of urine, which may be signs of hemolytic uremic syndrome Sudden and severe headache, confusion, change in vision, seizures, which may be signs of posterior reversible encephalopathy syndrome (PRES) Unusual bruising or bleeding Side effects that usually do not require medical attention (report to your care team if they continue or are bothersome): Diarrhea Drowsiness Hair loss Nausea Pain, redness, or swelling with sores inside the mouth or throat Vomiting This list may not describe all  possible side effects. Call your doctor for medical advice about side effects. You may report side effects to FDA at 1-800-FDA-1088. Where should I keep my medication? This medication is given in a hospital or clinic. It will not be stored at home. NOTE: This sheet is a summary. It may not cover all possible information. If you have questions about this medicine, talk to your doctor, pharmacist, or health care provider.  2023 Elsevier/Gold Standard (2022-02-20 00:00:00)  

## 2023-01-30 NOTE — Progress Notes (Signed)
Brief nutrition follow up completed with patient during infusion for Pancreas cancer.  Weight documented as 200 pounds 9.6 oz. April 3.  Labs include Glucose 139, Albumin 3.3 and Creatinine 0.51.  Pt is s/p Celiac ablation which has improved his pain and allowed him to eat more. He has been able to decrease pain medication. Noted thrush continues and patient taking Diflucan.  Appetite improved. He drinks Boost Plus 2 times daily most days.He has constipation which is improved with Miralax and prune juice. He is adding Senna daily. He has nausea after chemotherapy. Reports early satiety but is trying to eat smaller meals and snacks.  Nutrition Diagnosis: Food and Nutrition Related Knowledge Deficit ,improved.  Intervention: Continue small frequent meals and snacks throughout the day. Continue Boost Plus 2 times daily. Continue bowel regimen.  Monitoring, Evaluation, Goals: Patient will tolerate adequate calories and protein to provide increased energy/less fatigue for improved QOL.  No follow up scheduled at this time.

## 2023-01-31 ENCOUNTER — Other Ambulatory Visit: Payer: Self-pay

## 2023-01-31 ENCOUNTER — Emergency Department (HOSPITAL_COMMUNITY): Payer: Medicare HMO

## 2023-01-31 ENCOUNTER — Inpatient Hospital Stay (HOSPITAL_COMMUNITY)
Admission: EM | Admit: 2023-01-31 | Discharge: 2023-02-04 | DRG: 392 | Disposition: A | Discharge status: Hospice | Payer: Medicare HMO | Attending: Student | Admitting: Student

## 2023-01-31 ENCOUNTER — Encounter (HOSPITAL_COMMUNITY): Payer: Self-pay

## 2023-01-31 DIAGNOSIS — M79604 Pain in right leg: Secondary | ICD-10-CM | POA: Diagnosis not present

## 2023-01-31 DIAGNOSIS — L299 Pruritus, unspecified: Secondary | ICD-10-CM | POA: Diagnosis present

## 2023-01-31 DIAGNOSIS — Z8042 Family history of malignant neoplasm of prostate: Secondary | ICD-10-CM

## 2023-01-31 DIAGNOSIS — Z8546 Personal history of malignant neoplasm of prostate: Secondary | ICD-10-CM

## 2023-01-31 DIAGNOSIS — K567 Ileus, unspecified: Secondary | ICD-10-CM | POA: Diagnosis present

## 2023-01-31 DIAGNOSIS — E440 Moderate protein-calorie malnutrition: Secondary | ICD-10-CM | POA: Diagnosis present

## 2023-01-31 DIAGNOSIS — K2100 Gastro-esophageal reflux disease with esophagitis, without bleeding: Secondary | ICD-10-CM | POA: Diagnosis present

## 2023-01-31 DIAGNOSIS — Z808 Family history of malignant neoplasm of other organs or systems: Secondary | ICD-10-CM

## 2023-01-31 DIAGNOSIS — Z9079 Acquired absence of other genital organ(s): Secondary | ICD-10-CM | POA: Diagnosis not present

## 2023-01-31 DIAGNOSIS — Z79899 Other long term (current) drug therapy: Secondary | ICD-10-CM | POA: Diagnosis not present

## 2023-01-31 DIAGNOSIS — Z66 Do not resuscitate: Secondary | ICD-10-CM | POA: Insufficient documentation

## 2023-01-31 DIAGNOSIS — Z800 Family history of malignant neoplasm of digestive organs: Secondary | ICD-10-CM | POA: Diagnosis not present

## 2023-01-31 DIAGNOSIS — E876 Hypokalemia: Secondary | ICD-10-CM | POA: Diagnosis present

## 2023-01-31 DIAGNOSIS — R1084 Generalized abdominal pain: Secondary | ICD-10-CM | POA: Diagnosis not present

## 2023-01-31 DIAGNOSIS — Z7952 Long term (current) use of systemic steroids: Secondary | ICD-10-CM

## 2023-01-31 DIAGNOSIS — C787 Secondary malignant neoplasm of liver and intrahepatic bile duct: Secondary | ICD-10-CM | POA: Diagnosis present

## 2023-01-31 DIAGNOSIS — E1122 Type 2 diabetes mellitus with diabetic chronic kidney disease: Secondary | ICD-10-CM | POA: Diagnosis present

## 2023-01-31 DIAGNOSIS — Z8051 Family history of malignant neoplasm of kidney: Secondary | ICD-10-CM

## 2023-01-31 DIAGNOSIS — C251 Malignant neoplasm of body of pancreas: Secondary | ICD-10-CM | POA: Diagnosis present

## 2023-01-31 DIAGNOSIS — E785 Hyperlipidemia, unspecified: Secondary | ICD-10-CM | POA: Diagnosis present

## 2023-01-31 DIAGNOSIS — G893 Neoplasm related pain (acute) (chronic): Secondary | ICD-10-CM | POA: Diagnosis present

## 2023-01-31 DIAGNOSIS — Z515 Encounter for palliative care: Secondary | ICD-10-CM

## 2023-01-31 DIAGNOSIS — Z87891 Personal history of nicotine dependence: Secondary | ICD-10-CM

## 2023-01-31 DIAGNOSIS — T451X5A Adverse effect of antineoplastic and immunosuppressive drugs, initial encounter: Secondary | ICD-10-CM | POA: Diagnosis present

## 2023-01-31 DIAGNOSIS — Z79891 Long term (current) use of opiate analgesic: Secondary | ICD-10-CM

## 2023-01-31 DIAGNOSIS — D638 Anemia in other chronic diseases classified elsewhere: Secondary | ICD-10-CM | POA: Diagnosis present

## 2023-01-31 DIAGNOSIS — H409 Unspecified glaucoma: Secondary | ICD-10-CM | POA: Diagnosis present

## 2023-01-31 DIAGNOSIS — K219 Gastro-esophageal reflux disease without esophagitis: Secondary | ICD-10-CM | POA: Diagnosis present

## 2023-01-31 DIAGNOSIS — C259 Malignant neoplasm of pancreas, unspecified: Secondary | ICD-10-CM

## 2023-01-31 DIAGNOSIS — C786 Secondary malignant neoplasm of retroperitoneum and peritoneum: Secondary | ICD-10-CM | POA: Diagnosis present

## 2023-01-31 DIAGNOSIS — R112 Nausea with vomiting, unspecified: Secondary | ICD-10-CM | POA: Diagnosis present

## 2023-01-31 DIAGNOSIS — Z6827 Body mass index (BMI) 27.0-27.9, adult: Secondary | ICD-10-CM

## 2023-01-31 HISTORY — DX: Malignant neoplasm of pancreas, unspecified: C25.9

## 2023-01-31 HISTORY — DX: Neoplasm of unspecified behavior of other genitourinary organ: D49.59

## 2023-01-31 HISTORY — DX: Malignant neoplasm of bone and articular cartilage, unspecified: C41.9

## 2023-01-31 HISTORY — DX: Nausea with vomiting, unspecified: R11.2

## 2023-01-31 HISTORY — DX: Malignant neoplasm of pancreas, unspecified: C79.9

## 2023-01-31 LAB — URINALYSIS, ROUTINE W REFLEX MICROSCOPIC
Bilirubin Urine: NEGATIVE
Glucose, UA: NEGATIVE mg/dL
Ketones, ur: 80 mg/dL — AB
Leukocytes,Ua: NEGATIVE
Nitrite: NEGATIVE
Protein, ur: NEGATIVE mg/dL
Specific Gravity, Urine: 1.041 — ABNORMAL HIGH (ref 1.005–1.030)
pH: 7 (ref 5.0–8.0)

## 2023-01-31 LAB — COMPREHENSIVE METABOLIC PANEL
ALT: 17 U/L (ref 0–44)
AST: 23 U/L (ref 15–41)
Albumin: 3 g/dL — ABNORMAL LOW (ref 3.5–5.0)
Alkaline Phosphatase: 75 U/L (ref 38–126)
Anion gap: 11 (ref 5–15)
BUN: 8 mg/dL (ref 8–23)
CO2: 25 mmol/L (ref 22–32)
Calcium: 8.3 mg/dL — ABNORMAL LOW (ref 8.9–10.3)
Chloride: 101 mmol/L (ref 98–111)
Creatinine, Ser: 0.48 mg/dL — ABNORMAL LOW (ref 0.61–1.24)
GFR, Estimated: >60 mL/min (ref >60)
Glucose, Bld: 144 mg/dL — ABNORMAL HIGH (ref 70–99)
Potassium: 3.1 mmol/L — ABNORMAL LOW (ref 3.5–5.1)
Sodium: 137 mmol/L (ref 135–145)
Total Bilirubin: 1.3 mg/dL — ABNORMAL HIGH (ref 0.3–1.2)
Total Protein: 5.6 g/dL — ABNORMAL LOW (ref 6.5–8.1)

## 2023-01-31 LAB — TROPONIN I (HIGH SENSITIVITY)
Troponin I (High Sensitivity): 9 ng/L (ref <18)
Troponin I (High Sensitivity): 12 ng/L (ref <18)

## 2023-01-31 LAB — CBC WITH DIFFERENTIAL/PLATELET
Abs Immature Granulocytes: 0.04 10*3/uL (ref 0.00–0.07)
Basophils Absolute: 0 10*3/uL (ref 0.0–0.1)
Basophils Relative: 0 %
Eosinophils Absolute: 0 10*3/uL (ref 0.0–0.5)
Eosinophils Relative: 0 %
HCT: 36.7 % — ABNORMAL LOW (ref 39.0–52.0)
Hemoglobin: 12.1 g/dL — ABNORMAL LOW (ref 13.0–17.0)
Immature Granulocytes: 0 %
Lymphocytes Relative: 5 %
Lymphs Abs: 0.4 10*3/uL — ABNORMAL LOW (ref 0.7–4.0)
MCH: 28.1 pg (ref 26.0–34.0)
MCHC: 33 g/dL (ref 30.0–36.0)
MCV: 85.2 fL (ref 80.0–100.0)
Monocytes Absolute: 0.4 10*3/uL (ref 0.1–1.0)
Monocytes Relative: 5 %
Neutro Abs: 8.1 10*3/uL — ABNORMAL HIGH (ref 1.7–7.7)
Neutrophils Relative %: 90 %
Platelets: 190 10*3/uL (ref 150–400)
RBC: 4.31 MIL/uL (ref 4.22–5.81)
RDW: 16 % — ABNORMAL HIGH (ref 11.5–15.5)
WBC: 8.9 10*3/uL (ref 4.0–10.5)
nRBC: 0 % (ref 0.0–0.2)

## 2023-01-31 LAB — LACTIC ACID, PLASMA: Lactic Acid, Venous: 1.4 mmol/L (ref 0.5–1.9)

## 2023-01-31 LAB — LIPASE, BLOOD: Lipase: 22 U/L (ref 11–51)

## 2023-01-31 LAB — MAGNESIUM: Magnesium: 2 mg/dL (ref 1.7–2.4)

## 2023-01-31 LAB — PHOSPHORUS: Phosphorus: 2.6 mg/dL (ref 2.5–4.6)

## 2023-01-31 MED ORDER — MAGNESIUM SULFATE 2 GM/50ML IV SOLN
2.0000 g | Freq: Once | INTRAVENOUS | Status: AC
  Administered 2023-01-31: 2 g via INTRAVENOUS
  Filled 2023-01-31: qty 50

## 2023-01-31 MED ORDER — ONDANSETRON HCL 4 MG/2ML IJ SOLN
4.0000 mg | Freq: Once | INTRAMUSCULAR | Status: AC
  Administered 2023-01-31: 4 mg via INTRAVENOUS
  Filled 2023-01-31: qty 2

## 2023-01-31 MED ORDER — ENOXAPARIN SODIUM 40 MG/0.4ML IJ SOSY
40.0000 mg | PREFILLED_SYRINGE | INTRAMUSCULAR | Status: DC
  Administered 2023-01-31 – 2023-02-03 (×4): 40 mg via SUBCUTANEOUS
  Filled 2023-01-31 (×4): qty 0.4

## 2023-01-31 MED ORDER — ACETAMINOPHEN 650 MG RE SUPP: 650.0000 mg | Freq: Four times a day (QID) | RECTAL | Status: DC | PRN

## 2023-01-31 MED ORDER — POTASSIUM CHLORIDE IN NACL 20-0.9 MEQ/L-% IV SOLN
INTRAVENOUS | Status: DC
  Filled 2023-01-31 (×8): qty 1000

## 2023-01-31 MED ORDER — LACTATED RINGERS IV BOLUS
1000.0000 mL | Freq: Once | INTRAVENOUS | Status: AC
  Administered 2023-01-31: 1000 mL via INTRAVENOUS

## 2023-01-31 MED ORDER — POTASSIUM CHLORIDE 10 MEQ/100ML IV SOLN
10.0000 meq | INTRAVENOUS | Status: AC
  Administered 2023-01-31 (×2): 10 meq via INTRAVENOUS
  Filled 2023-01-31 (×2): qty 100

## 2023-01-31 MED ORDER — IOHEXOL 350 MG/ML SOLN
100.0000 mL | Freq: Once | INTRAVENOUS | Status: AC | PRN
  Administered 2023-01-31: 100 mL via INTRAVENOUS

## 2023-01-31 MED ORDER — CHLORHEXIDINE GLUCONATE CLOTH 2 % EX PADS
6.0000 | MEDICATED_PAD | Freq: Every day | CUTANEOUS | Status: DC
  Administered 2023-01-31 – 2023-02-04 (×5): 6 via TOPICAL

## 2023-01-31 MED ORDER — ACETAMINOPHEN 325 MG PO TABS
650.0000 mg | ORAL_TABLET | Freq: Four times a day (QID) | ORAL | Status: DC | PRN
  Administered 2023-02-02 – 2023-02-03 (×2): 650 mg via ORAL
  Filled 2023-01-31 (×2): qty 2

## 2023-01-31 MED ORDER — PROCHLORPERAZINE EDISYLATE 10 MG/2ML IJ SOLN
10.0000 mg | Freq: Four times a day (QID) | INTRAMUSCULAR | Status: DC | PRN
  Filled 2023-01-31: qty 2

## 2023-01-31 MED ORDER — HYDROMORPHONE HCL 1 MG/ML IJ SOLN
1.0000 mg | INTRAMUSCULAR | Status: DC
  Administered 2023-01-31 – 2023-02-02 (×24): 1 mg via INTRAVENOUS
  Filled 2023-01-31 (×24): qty 1

## 2023-01-31 MED ORDER — PANTOPRAZOLE SODIUM 40 MG IV SOLR
40.0000 mg | INTRAVENOUS | Status: DC
  Administered 2023-01-31 – 2023-02-03 (×4): 40 mg via INTRAVENOUS
  Filled 2023-01-31 (×5): qty 10

## 2023-01-31 MED ORDER — ONDANSETRON HCL 4 MG/2ML IJ SOLN
4.0000 mg | Freq: Four times a day (QID) | INTRAMUSCULAR | Status: DC | PRN
  Administered 2023-01-31 – 2023-02-03 (×9): 4 mg via INTRAVENOUS
  Filled 2023-01-31 (×9): qty 2

## 2023-01-31 MED ORDER — HYDROMORPHONE HCL 1 MG/ML IJ SOLN
1.0000 mg | Freq: Once | INTRAMUSCULAR | Status: AC
  Administered 2023-01-31: 1 mg via INTRAVENOUS
  Filled 2023-01-31: qty 1

## 2023-01-31 MED ORDER — SODIUM CHLORIDE (PF) 0.9 % IJ SOLN
INTRAMUSCULAR | Status: AC
  Filled 2023-01-31: qty 50

## 2023-01-31 NOTE — Plan of Care (Signed)

## 2023-01-31 NOTE — ED Notes (Signed)
Pt return from CT scanner, NAD noted, pt alert 

## 2023-01-31 NOTE — ED Triage Notes (Addendum)
Pt arrived from home BIB [facility] for abd and vomiting. Pt had chemo treatment yesterday pancreatic cancer that has metastasis to the bone and has developed  malignant neoplasm of prostate. Pt unable to keep anything down, family reports he has not been able to handle chemo this go round, providers having a hard time controlling pt's pain and nausea and vomiting. VSS, Pt alert, A&O x3.  Per EMS, wife and daughter wants to talk to provides about hospice or palliative care, they advised pt is refusing this, but family feels this is the best option for pt at this time as he is "deteriorating". They are wanting to have a "DNR" or "MOST" form instated for pt's care.

## 2023-01-31 NOTE — ED Notes (Signed)
Pt assisted with a bedpan, pt feels he needs to have a BM

## 2023-01-31 NOTE — ED Notes (Signed)
Pt is resting in bed with wife by bedside. Pt is CAOx4. Equal chest rise and fall. No obvious distress. When asked about pain, pt rates abdominal pain at a 3.

## 2023-01-31 NOTE — H&P (Signed)
History and Physical    Patient: Jared [name] [id number] DOB: 09/29/1946 DOA: 01/31/2023 DOS: the patient was seen and examined on 01/31/2023 PCP: [name], MD  Patient coming from: Home  Chief Complaint:  Chief Complaint  Patient presents with   Abdominal Pain   Emesis   HPI: Jared [name] is a 77 y.o. male with medical history significant of aortic atherosclerosis, cataracts, osteoarthritis, choledocholithiasis, urolithiasis, GERD, glaucoma, hyperlipidemia, inguinal hernia, leukocytosis, prostate cancer, metastatic pancreatic cancer with chronic abdominal pain, celiac plexus block 2 days ago who underwent chemotherapy yesterday with gemcitabine/Abraxan coming to the emergency department with abdominal pain, multiple episodes of nausea and emesis. He is being followed by palliative care who was recently trying to titrate down his oral hydromorphone after celiac plexus block.   No diarrhea, constipation, melena or hematochezia.  No flank pain, dysuria, frequency or hematuria. He denied fever, chills, rhinorrhea, sore throat, wheezing or hemoptysis.  No chest pain, palpitations, diaphoresis, PND, orthopnea or pitting edema of the lower extremities. No polyuria, polydipsia, polyphagia or blurred vision.   Lab work: His urinalysis showed increase a specific gravity, small hemoglobin, ketonuria of 80 mg/dL and rare bacteria microscopic examination.  CBC with a white count 8.9 with 90% neutrophils, hemoglobin 12.1 g/dL platelets 190.  CMP showed a potassium 3.1 mmol/L, the rest of the electrolytes were normal after calcium correction.  Total bilirubin 1.3, glucose 144, normal BUN and creatinine 0.48 mg/dL.  Total protein 5.6 and albumin 3.0 g/dL.  Transaminases, alkaline phosphatase, lactic acid, troponin x 2, lipase, phosphorus and magnesium were normal.  Imaging: CTA chest with no PE, bilateral atelectasis.  There is mild to moderate generalized esophageal wall thickening appears new or  increased since February.  Consider acute esophagitis.  CT abdomen/pelvis with progression of primary metastatic pancreatic cancer since February.  There were new liver metastasis.  Increasing omental and retroperitoneal metastatic disease.  Questionable ileus but no evidence of mechanical bowel obstruction.  ED course: Initial vital signs were temperature 98.1 F, pulse 87, respirations 28, BP 132/99 mmHg O2 sat 98% on room air.  The patient received 3 doses of hydromorphone 1 mg IVP, LR 1000 mL bolus x 2, ondansetron 4 mg IVP, potassium 10 mEq IVPB x 2.   Review of Systems: As mentioned in the history of present illness. All other systems reviewed and are negative. Past Medical History:  Diagnosis Date   Aortic atherosclerosis 10/02/2022   Arthritis    SHOULDER & KNEES   Bilateral cataracts 10/02/2022   Bone cancer    Cataract    bilateral sx   Choledocholithiasis 07/23/2022   Chronic kidney disease    pt states he does not have kidney disease, just a hx of kidney stones   GERD 02/22/2009   Qualifier: Diagnosis of   By: Carlean Purl MD, Tonna Boehringer E      Glaucoma 10/02/2022   History of kidney stones    Hyperlipidemia 09/06/2021   The 10-year ASCVD risk score (Arnett DK, et al., 2019) is: 12.5%    Values used to calculate the score:      Age: 59 years      Sex: Male      Is Non-Hispanic African American: Yes      Diabetic: No      Tobacco smoker: No      Systolic Blood Pressure: 0000000 mmHg      Is BP treated: No      HDL Cholesterol: 82 mg/dL  Total Cholesterol: 241 mg/dL      Inguinal hernia    right   Intractable nausea and vomiting 01/31/2023   Leukocytosis 10/02/2022   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    Neoplasm of prostate    Pancreatic cancer 2023   Prostate cancer 10/29/1988   Past Surgical History:  Procedure Laterality Date   CATARACT EXTRACTION Bilateral    CHOLECYSTECTOMY N/A 07/14/2013   Procedure: LAPAROSCOPIC CHOLECYSTECTOMY;  Surgeon: Stark Klein, MD;  Location: [facility];  Service: General;  Laterality: N/A;   COLONOSCOPY  01/07/2004   Dr. Silvano Rusk   COLONOSCOPY  02/2022   CG-MAC-mira(good)-tics-2 polyps   ENDOSCOPIC RETROGRADE CHOLANGIOPANCREATOGRAPHY (ERCP) WITH PROPOFOL N/A 08/23/2022   Procedure: ENDOSCOPIC RETROGRADE CHOLANGIOPANCREATOGRAPHY (ERCP) WITH PROPOFOL;  Surgeon: Irving Copas., MD;  Location: [facility];  Service: Gastroenterology;  Laterality: N/A;   ESOPHAGOGASTRODUODENOSCOPY (EGD) WITH PROPOFOL N/A 07/05/2022   Procedure: ESOPHAGOGASTRODUODENOSCOPY (EGD) WITH PROPOFOL;  Surgeon: Rush Landmark Telford Nab., MD;  Location: [place];  Service: Gastroenterology;  Laterality: N/A;   EUS N/A 07/05/2022   Procedure: UPPER ENDOSCOPIC ULTRASOUND (EUS) RADIAL;  Surgeon: Irving Copas., MD;  Location: [place];  Service: Gastroenterology;  Laterality: N/A;   EYE SURGERY     FINE NEEDLE ASPIRATION  07/05/2022   Procedure: FINE NEEDLE ASPIRATION (FNA) LINEAR;  Surgeon: Irving Copas., MD;  Location: [place];  Service: Gastroenterology;;   INGUINAL HERNIA REPAIR Right    LAPAROSCOPY N/A 11/27/2022   Procedure: DIAGNOSTIC LAPAROSCOPY;  Surgeon: Stark Klein, MD;  Location: [place];  Service: General;  Laterality: N/A;  EPIDURAL   LAPAROTOMY N/A 11/27/2022   Procedure: EXPLORATORY LAPAROTOMY;  Surgeon: Stark Klein, MD;  Location: [place];  Service: General;  Laterality: N/A;   OPERATIVE ULTRASOUND N/A 11/27/2022   Procedure: INTRAOPERATIVE ULTRASOUND;  Surgeon: Stark Klein, MD;  Location: [place];  Service: General;  Laterality: N/A;   PORTACATH PLACEMENT Left 07/31/2022   Procedure: INSERTION PORT-A-CATH;  Surgeon: Stark Klein, MD;  Location: [place];  Service: General;  Laterality: Left;   PROSTATECTOMY     REMOVAL OF STONES  08/23/2022   Procedure: REMOVAL OF STONES;  Surgeon: Irving Copas., MD;  Location: [facility];  Service: Gastroenterology;;   Joan Mayans  08/23/2022   Procedure: SPHINCTEROTOMY;   Surgeon: Irving Copas., MD;  Location: [facility];  Service: Gastroenterology;;   TONSILLECTOMY     Social History:  reports that he quit smoking about 41 years ago. His smoking use included cigarettes. He has a 10.00 pack-year smoking history. He has never used smokeless tobacco. He reports that he does not drink alcohol and does not use drugs.  Allergies  Allergen Reactions   Pseudoephedrine Other (See Comments)    Increases ocular pressure   Lupron [Leuprolide] Itching and Other (See Comments)    Caused infection on hip     Family History  Problem Relation Age of Onset   Pancreatic cancer Mother 53   Kidney cancer Mother 4   Thyroid cancer Mother 30   Prostate cancer Father 66   Cancer Sister 60       carcinoid tumor   Prostate cancer Brother 60   Prostate cancer Brother 52   Thyroid cancer Brother 66   Prostate cancer Brother 34   Prostate cancer Brother 109   Pancreatic cancer Paternal Aunt 77   Pancreatic cancer Paternal Aunt 24   Prostate cancer Paternal Uncle 68   Prostate cancer Paternal Uncle 31   Cervical cancer Maternal Grandmother 64  Prostate cancer Paternal Grandfather 78   Colon cancer Neg Hx    Stomach cancer Neg Hx    Esophageal cancer Neg Hx    Colon polyps Neg Hx    Rectal cancer Neg Hx     Prior to Admission medications   Medication Sig Start Date End Date Taking? Authorizing Provider  acetaminophen (TYLENOL) 325 MG tablet Take 325 mg by mouth daily as needed for headache.    [provider]  brimonidine (ALPHAGAN) 0.15 % ophthalmic solution Place 1 drop into the left eye in the morning and at bedtime.    [provider]  Carboxymethylcell-Glycerin PF (REFRESH OPTIVE PF) 0.5-0.9 % SOLN Place 1 drop into both eyes as needed (dry eyes). 04/28/18   [provider]  cholecalciferol (VITAMIN D3) 25 MCG (1000 UNIT) tablet Take 1,000 Units by mouth daily.    [provider]  diphenhydrAMINE (BENADRYL) 25 mg  capsule Take 1 capsule (25 mg total) by mouth every 6 (six) hours as needed (left leg erythema). 01/11/23   Nita Sells, MD  Dorzolamide HCl-Timolol Mal PF 2-0.5 % SOLN Place 1 drop into the left eye 2 (two) times daily. 12/01/21   [provider]  fentaNYL (DURAGESIC) 100 MCG/HR Place 1 patch onto the skin every 3 (three) days. 01/29/23   Pickenpack-Cousar, Carlena Sax, NP  fluconazole (DIFLUCAN) 100 MG tablet Take 1 tablet (100 mg total) by mouth daily. 01/30/23   Ladell Pier, MD  HYDROmorphone (DILAUDID) 4 MG tablet Take 2-3 tablets (8-12 mg total) by mouth every 4 (four) hours as needed for severe pain. 01/22/23   Ladell Pier, MD  lidocaine-prilocaine (EMLA) cream Apply 1 Application topically as needed. 12/11/22   Ladell Pier, MD  olopatadine (PATANOL) 0.1 % ophthalmic solution Place 1 drop into both eyes daily as needed for allergies. Patient not taking: Reported on 01/30/2023    [provider]  ondansetron (ZOFRAN) 8 MG tablet Take 1 tablet (8 mg total) by mouth every 8 (eight) hours as needed for nausea or vomiting (Starting day 3 after chemo as needed for nausea(received Aloxi with chemo)). 09/10/22   Ladell Pier, MD  pantoprazole (PROTONIX) 40 MG tablet Take 1 tablet (40 mg total) by mouth 2 (two) times daily. 01/29/23   Pickenpack-Cousar, Carlena Sax, NP  Polyethylene Glycol 3350 (MIRALAX PO) Take 17 g by mouth daily as needed (constipation).    [provider]  predniSONE (DELTASONE) 10 MG tablet Take 1 tablet (10 mg total) by mouth daily with breakfast. 01/17/23   Ladell Pier, MD  prochlorperazine (COMPAZINE) 10 MG tablet Take 1 tablet (10 mg total) by mouth every 6 (six) hours as needed for nausea or vomiting. Patient not taking: Reported on 01/17/2023 10/07/22   Eugenie Filler, MD  sodium chloride (AYR) 0.65 % nasal spray Place 2-3 sprays into the nose as needed for congestion.    [provider]  tiZANidine (ZANAFLEX) 4 MG tablet  Take 1 tablet (4 mg total) by mouth every 8 (eight) hours as needed for muscle spasms. Patient not taking: Reported on 01/17/2023 11/30/22   Stark Klein, MD  Triptorelin Pamoate 11.25 MG SUSR Inject 11.25 mg into the muscle as needed (PSA). Patient not taking: Reported on 01/17/2023    [provider]    Physical Exam: Vitals:   01/31/23 0630 01/31/23 0636 01/31/23 0700 01/31/23 0730  BP: 118/75  105/67 119/72  Pulse: 71  70 65  Resp: 17  20 19  Temp:  98.6 F (37 C)    TempSrc:  Oral    SpO2: 100%  99% 99%  Weight:      Height:       Physical Exam Vitals and nursing note reviewed.  Constitutional:      General: He is awake. He is not in acute distress.    Appearance: He is well-developed and overweight.  HENT:     Head: Normocephalic.     Nose: No rhinorrhea.     Mouth/Throat:     Mouth: Mucous membranes are dry.  Eyes:     General: No scleral icterus.    Pupils: Pupils are equal, round, and reactive to light.  Neck:     Vascular: No JVD.  Cardiovascular:     Rate and Rhythm: Normal rate and regular rhythm.     Heart sounds: S1 normal and S2 normal.  Pulmonary:     Effort: Pulmonary effort is normal.     Breath sounds: Normal breath sounds. No wheezing, rhonchi or rales.  Abdominal:     General: Bowel sounds are normal. There is no distension.     Palpations: Abdomen is soft.     Tenderness: There is abdominal tenderness. There is no right CVA tenderness, left CVA tenderness, guarding or rebound.  Musculoskeletal:     Cervical back: Neck supple.     Right lower leg: Edema present.     Left lower leg: Edema present.  Skin:    General: Skin is warm and dry.  Neurological:     General: No focal deficit present.     Mental Status: He is alert and oriented to person, place, and time.  Psychiatric:        Mood and Affect: Mood normal.        Behavior: Behavior normal. Behavior is cooperative.     Data Reviewed:  Results are pending, will review when  available.  Assessment and Plan: Principal Problem:   Generalized abdominal pain Associated with:   Intractable nausea and vomiting With esophagitis And probable:   Ileus In the setting of:   Metastatic cancer of pancreas, body Observation/MedSurg. Keep NPO. Advance to [facility] as tolerated. Continue IV fluids. Analgesics as needed. Antiemetics as needed. Pantoprazole 40 mg IVP every 24 hours. Keep electrolytes optimized. Follow-up CBC and CMP in AM.  Active Problems:   GERD (gastroesophageal reflux disease) Continue PPI as above.    Hypokalemia Continue potassium supplementation. Magnesium sulfate 2 g IVPB.    Glaucoma Continue home eyedrops. Follow-up with ophthalmology as an outpatient.    Anemia of chronic disease Monitor hematocrit and hemoglobin.    Advance Care Planning:   Code Status: Full Code   Consults:   Family Communication:   Severity of Illness: The appropriate patient status for this patient is INPATIENT. Inpatient status is judged to be reasonable and necessary in order to provide the required intensity of service to ensure the patient's safety. The patient's presenting symptoms, physical exam findings, and initial radiographic and laboratory data in the context of their chronic comorbidities is felt to place them at high risk for further clinical deterioration. Furthermore, it is not anticipated that the patient will be medically stable for discharge from the hospital within 2 midnights of admission.   * I certify that at the point of admission it is my clinical judgment that the patient will require inpatient hospital care spanning beyond 2 midnights from the point of admission due to high intensity of service, high risk for  further deterioration and high frequency of surveillance required.*  Author: Reubin Milan, MD 01/31/2023 7:51 AM  For on call review [email or web address].   This document was prepared using Dragon voice recognition software and  may contain some unintended transcription errors.

## 2023-01-31 NOTE — ED Provider Notes (Signed)
[facility] Provider Note   CSN: [id number] Arrival date & time: 01/31/23  [id number]     History  Chief Complaint  Patient presents with   Abdominal Pain   Emesis    Jared [name] is a 77 y.o. male.  Patient with metastatic prostate cancer presenting from home with abdominal pain, nausea and vomiting.  He underwent a cycle of chemotherapy yesterday with  gemcitabine/Abraxane which she has had before and developed nausea and vomiting several hours later unable to keep in his home medications down.  States intractable lower abdominal pain with nausea and vomiting since about 5 PM.  Has diarrhea chronically which is unchanged.  Lower abdominal pain is similar to previous.  He follows with palliative care as well as oncology.  He underwent a celiac plexus block 2 days ago for his pain and saw palliative care who is trying to titrate down his Dilaudid.  He currently takes 12 mg of Dilaudid per day at home.  No known fever.  No chest pain or shortness of breath.  No pain with urination or blood in the urine. Denies any chest pain or shortness of breath.  No cough or fever.  He has had reactions to chemotherapy in the past that have required hospitalization.  The history is provided by the patient, the EMS personnel and a relative. The history is limited by the condition of the patient.  Abdominal Pain Associated symptoms: nausea and vomiting   Associated symptoms: no cough, no dysuria, no fever, no hematuria and no shortness of breath   Emesis Associated symptoms: abdominal pain   Associated symptoms: no arthralgias, no cough, no fever, no headaches and no myalgias        Home Medications Prior to Admission medications   Medication Sig Start Date End Date Taking? Authorizing Provider  acetaminophen (TYLENOL) 325 MG tablet Take 325 mg by mouth daily as needed for headache.    [provider]  brimonidine (ALPHAGAN) 0.15 % ophthalmic  solution Place 1 drop into the left eye in the morning and at bedtime.    [provider]  Carboxymethylcell-Glycerin PF (REFRESH OPTIVE PF) 0.5-0.9 % SOLN Place 1 drop into both eyes as needed (dry eyes). 04/28/18   [provider]  cholecalciferol (VITAMIN D3) 25 MCG (1000 UNIT) tablet Take 1,000 Units by mouth daily.    [provider]  diphenhydrAMINE (BENADRYL) 25 mg capsule Take 1 capsule (25 mg total) by mouth every 6 (six) hours as needed (left leg erythema). 01/11/23   Nita Sells, MD  Dorzolamide HCl-Timolol Mal PF 2-0.5 % SOLN Place 1 drop into the left eye 2 (two) times daily. 12/01/21   [provider]  fentaNYL (DURAGESIC) 100 MCG/HR Place 1 patch onto the skin every 3 (three) days. 01/29/23   Pickenpack-Cousar, Carlena Sax, NP  fluconazole (DIFLUCAN) 100 MG tablet Take 1 tablet (100 mg total) by mouth daily. 01/30/23   Ladell Pier, MD  HYDROmorphone (DILAUDID) 4 MG tablet Take 2-3 tablets (8-12 mg total) by mouth every 4 (four) hours as needed for severe pain. 01/22/23   Ladell Pier, MD  lidocaine-prilocaine (EMLA) cream Apply 1 Application topically as needed. 12/11/22   Ladell Pier, MD  olopatadine (PATANOL) 0.1 % ophthalmic solution Place 1 drop into both eyes daily as needed for allergies. Patient not taking: Reported on 01/30/2023    [provider]  ondansetron (ZOFRAN) 8 MG tablet Take 1 tablet (8 mg total)  by mouth every 8 (eight) hours as needed for nausea or vomiting (Starting day 3 after chemo as needed for nausea(received Aloxi with chemo)). 09/10/22   Ladell Pier, MD  pantoprazole (PROTONIX) 40 MG tablet Take 1 tablet (40 mg total) by mouth 2 (two) times daily. 01/29/23   Pickenpack-Cousar, Carlena Sax, NP  Polyethylene Glycol 3350 (MIRALAX PO) Take 17 g by mouth daily as needed (constipation).    [provider]  predniSONE (DELTASONE) 10 MG tablet Take 1 tablet (10 mg total) by mouth daily with breakfast.  01/17/23   Ladell Pier, MD  prochlorperazine (COMPAZINE) 10 MG tablet Take 1 tablet (10 mg total) by mouth every 6 (six) hours as needed for nausea or vomiting. Patient not taking: Reported on 01/17/2023 10/07/22   Eugenie Filler, MD  sodium chloride (AYR) 0.65 % nasal spray Place 2-3 sprays into the nose as needed for congestion.    [provider]  tiZANidine (ZANAFLEX) 4 MG tablet Take 1 tablet (4 mg total) by mouth every 8 (eight) hours as needed for muscle spasms. Patient not taking: Reported on 01/17/2023 11/30/22   Stark Klein, MD  Triptorelin Pamoate 11.25 MG SUSR Inject 11.25 mg into the muscle as needed (PSA). Patient not taking: Reported on 01/17/2023    [provider]      Allergies    Pseudoephedrine and Lupron [leuprolide]    Review of Systems   Review of Systems  Constitutional:  Positive for activity change and appetite change. Negative for fever.  HENT:  Negative for congestion and rhinorrhea.   Respiratory:  Negative for cough, chest tightness and shortness of breath.   Gastrointestinal:  Positive for abdominal pain, nausea and vomiting.  Genitourinary:  Negative for dysuria and hematuria.  Musculoskeletal:  Negative for arthralgias and myalgias.  Skin:  Negative for rash.  Neurological:  Negative for dizziness, weakness and headaches.   all other systems are negative except as noted in the HPI and PMH.    Physical Exam Updated Vital Signs BP 132/89 (BP Location: Right Arm)   Pulse 87   Temp 98.1 F (36.7 C) (Oral)   Resp (!) 28   Ht 6' (1.829 m)   Wt 91 kg   SpO2 98%   BMI 27.21 kg/m  Physical Exam Vitals and nursing note reviewed.  Constitutional:      General: He is in acute distress.     Appearance: He is well-developed. He is ill-appearing.     Comments: Chronically ill-appearing, vomiting  HENT:     Head: Normocephalic and atraumatic.     Mouth/Throat:     Pharynx: No oropharyngeal exudate.  Eyes:     Conjunctiva/sclera:  Conjunctivae normal.     Pupils: Pupils are equal, round, and reactive to light.  Neck:     Comments: No meningismus. Cardiovascular:     Rate and Rhythm: Normal rate and regular rhythm.     Heart sounds: Normal heart sounds. No murmur heard. Pulmonary:     Effort: Pulmonary effort is normal. No respiratory distress.     Breath sounds: Normal breath sounds.  Abdominal:     Palpations: Abdomen is soft.     Tenderness: There is abdominal tenderness. There is no guarding or rebound.     Comments: Diffuse tenderness, worse periumbilically.  Musculoskeletal:        General: No tenderness. Normal range of motion.     Cervical back: Normal range of motion and neck supple.  Skin:  General: Skin is warm.  Neurological:     Mental Status: He is alert and oriented to person, place, and time.     Cranial Nerves: No cranial nerve deficit.     Motor: No abnormal muscle tone.     Coordination: Coordination normal.     Comments:  5/5 strength throughout. CN 2-12 intact.Equal grip strength.   Psychiatric:        Behavior: Behavior normal.     ED Results / Procedures / Treatments   Labs (all labs ordered are listed, but only abnormal results are displayed) Labs Reviewed  CBC WITH DIFFERENTIAL/PLATELET - Abnormal; Notable for the following components:      Result Value   Hemoglobin 12.1 (*)    HCT 36.7 (*)    RDW 16.0 (*)    Neutro Abs 8.1 (*)    Lymphs Abs 0.4 (*)    All other components within normal limits  COMPREHENSIVE METABOLIC PANEL - Abnormal; Notable for the following components:   Potassium 3.1 (*)    Glucose, Bld 144 (*)    Creatinine, Ser 0.48 (*)    Calcium 8.3 (*)    Total Protein 5.6 (*)    Albumin 3.0 (*)    Total Bilirubin 1.3 (*)    All other components within normal limits  LIPASE, BLOOD  LACTIC ACID, PLASMA  URINALYSIS, ROUTINE W REFLEX MICROSCOPIC  TROPONIN I (HIGH SENSITIVITY)  TROPONIN I (HIGH SENSITIVITY)    EKG EKG  Interpretation  Date/Time:  Thursday January 31 2023 03:25:05 EDT Ventricular Rate:  83 PR Interval:  160 QRS Duration: 151 QT Interval:  432 QTC Calculation: 508 R Axis:   83 Text Interpretation: Sinus rhythm Right bundle branch block No significant change was found Confirmed by Ezequiel Essex [phone]) on 01/31/2023 3:35:29 AM  Radiology CT ABDOMEN PELVIS W CONTRAST  Result Date: 01/31/2023 CLINICAL DATA:  77 year old male with pancreatic cancer undergoing chemotherapy. Recent celiac axis CT-guided nerve block/ablation 01/28/2023. Acute pain. EXAM: CT ABDOMEN AND PELVIS WITH CONTRAST TECHNIQUE: Multidetector CT imaging of the abdomen and pelvis was performed using the standard protocol following bolus administration of intravenous contrast. RADIATION DOSE REDUCTION: This exam was performed according to the departmental dose-optimization program which includes automated exposure control, adjustment of the mA and/or kV according to patient size and/or use of iterative reconstruction technique. CONTRAST:  174mL OMNIPAQUE IOHEXOL 350 MG/ML SOLN COMPARISON:  CTA chest today reported separately. Restaging CT Chest, Abdomen, and Pelvis 12/12/2022. CT-guided images recent celiac plexus neurolytic ablation 01/28/2023. FINDINGS: Lower chest: Stable to the CTA chest reported separately today. Hepatobiliary: Numerous small hypodense liver metastases were not apparent in February. Gallbladder is chronically absent. Pancreas: Multifocal mixed enhancing and cystic/necrotic tumor of the pancreas (series 2, image 25) appears progressed. Associated pancreatic tail atrophy and marked pancreatic ductal dilatation at the tail. Spleen: Negative. Adrenals/Urinary Tract: Adrenal glands remain within normal limits. There are progressed right pararenal space tumor implants/metastases (series 2, images 21 and 24) which are now up to 1.9 cm (1.1 cm previously). But renal enhancement and contrast excretion is maintained. No renal  parenchymal metastasis. Nephrolithiasis on the right. Bladder appears to remain within normal limits. Stomach/Bowel: Multifocal heterogeneously enhancing, centrally necrotic omental metastases scattered in the abdomen and pelvis have enlarged since February, including the left lower quadrant peritoneal metastasis on series 2, image 61 (now 2.5 cm previously 1.9 cm) and similar perirectal implant on series 2, image 75 (now 1.9 cm previously 1.2 cm). But there is no evidence of associated  mechanical bowel obstruction. There is fluid in the stomach and in various small bowel loops, and ileus is possible. But no differential dilatation of bowel. Right colon and transverse colon are decompressed. Large bowel inflammation in those segments is difficult to exclude. There is no pneumoperitoneum. No free fluid identified. Vascular/Lymphatic: Normal caliber abdominal aorta. Major arterial structures in the abdomen and pelvis remain patent. Aortoiliac calcified atherosclerosis. Portal venous system remains patent, although there is mass effect on the SMV confluence which is probably tumor related (series 2, image 26). Necrotic left retroperitoneal, pararenal lymphadenopathy has progressed since February now up to 4.2 cm on series 2, image 27, 2.6 cm previously. Reproductive: Nonspecific heterogeneity in the left inguinal canal is more conspicuous since February. No regional in quinolone lymphadenopathy. Other: No pelvis free fluid. Extensive bilateral pelvic sidewall surgical clips appear stable. Musculoskeletal: Stable visualized osseous structures. No acute or suspicious osseous lesion is identified. IMPRESSION: 1. Progressed Primary and Metastatic Pancreatic Cancer since February. New liver metastases. Increasing omental, and retroperitoneal metastatic disease. Ileus is possible, but there is no evidence of mechanical bowel obstruction despite multifocal omental implants. 2. CTA Chest reported separately today.  Electronically Signed   By: Genevie Ann M.D.   On: 01/31/2023 06:15   CT Angio Chest PE W and/or Wo Contrast  Result Date: 01/31/2023 CLINICAL DATA:  77 year old male with pancreatic cancer undergoing chemotherapy. Recent celiac axis CT-guided nerve block/ablation 01/28/2023. Acute pain. EXAM: CT ANGIOGRAPHY CHEST WITH CONTRAST TECHNIQUE: Multidetector CT imaging of the chest was performed using the standard protocol during bolus administration of intravenous contrast. Multiplanar CT image reconstructions and MIPs were obtained to evaluate the vascular anatomy. RADIATION DOSE REDUCTION: This exam was performed according to the departmental dose-optimization program which includes automated exposure control, adjustment of the mA and/or kV according to patient size and/or use of iterative reconstruction technique. CONTRAST:  162mL OMNIPAQUE IOHEXOL 350 MG/ML SOLN COMPARISON:  CT Abdomen and Pelvis today reported separately. Restaging CT Chest, Abdomen, and Pelvis 12/12/2022. FINDINGS: Cardiovascular: Good contrast bolus timing in the pulmonary arterial tree. No pulmonary artery filling defect. Heart size at the upper limits of normal. No pericardial effusion. Negative thoracic aorta aside from mild tortuosity. Stable left chest Port-A-Cath. Mediastinum/Nodes: No mediastinal mass or lymphadenopathy. Nonspecific mild to moderate generalized esophageal wall thickening (series 4, image 28) is new or increased since February. Lungs/Pleura: Lower lung volumes compared to February. Mild respiratory motion. Central airways remain patent. Dependent and also platelike lower lobe atelectasis greater on the left. No new or suspicious pulmonary nodule. No pleural effusion or convincing lung inflammation. Upper Abdomen: Reported separately today. Musculoskeletal: Stable visualized osseous structures. Dextroconvex thoracic scoliosis. Review of the MIP images confirms the above findings. IMPRESSION: 1. Negative for acute pulmonary  embolus. Lower lung volumes with atelectasis. 2. Mild to moderate generalized esophageal wall thickening appears new or increased since February. Consider acute Esophagitis. 3. CT Abdomen and Pelvis today reported separately. Electronically Signed   By: Genevie Ann M.D.   On: 01/31/2023 06:04    Procedures Procedures    Medications Ordered in ED Medications  lactated ringers bolus 1,000 mL (has no administration in time range)  ondansetron (ZOFRAN) injection 4 mg (has no administration in time range)  HYDROmorphone (DILAUDID) injection 1 mg (has no administration in time range)    ED Course/ Medical Decision Making/ A&P  Medical Decision Making Amount and/or Complexity of Data Reviewed Independent Historian: caregiver and EMS Labs: ordered. Decision-making details documented in ED Course. Radiology: ordered and independent interpretation performed. Decision-making details documented in ED Course. ECG/medicine tests: ordered and independent interpretation performed. Decision-making details documented in ED Course.  Risk Prescription drug management. Decision regarding hospitalization.   Metastatic pancreatic cancer here with nausea, vomiting, abdominal pain after receiving chemotherapy earlier today.  Vital stable, uncomfortable.  Labs reassuring.  No significant leukocytosis. Hypokalemia of 3.1.  IVF given, pain and nausea meds given.  Vomiting in setting of recent chemotherapy.   CT shows evidence of progressive metastatic disease, ileus without frank obstruction. No PE. No pneumonia.   Pain and nausea continue. Patient would prefer to be admitted for symptom control. This seems reasonable. May benefit from oncology consultation as well given progression of metastatic disease.  D/w Dr. [place] Crane.        Final Clinical Impression(s) / ED Diagnoses Final diagnoses:  Intractable nausea and vomiting  Malignant neoplasm of pancreas, unspecified location  of malignancy    Rx / DC Orders ED Discharge Orders     None         Corry Ihnen, Annie Main, MD 01/31/23 [phone]

## 2023-01-31 NOTE — ED Notes (Signed)
Pt to CT scanner at this time 

## 2023-01-31 NOTE — ED Notes (Signed)
ED TO INPATIENT HANDOFF REPORT  Name/Age/Gender Jared [name] 77 y.o. male  Code Status    Code Status Orders  (From admission, onward)           Start     Ordered   01/31/23 0956  Full code  Continuous       Question:  By:  Answer:  Consent: discussion documented in EHR   01/31/23 0956           Code Status History     Date Active Date Inactive Code Status Order ID Comments User Context   01/28/2023 1238 01/29/2023 0512 Full Code [id number]  Suzette Battiest, MD HOV   01/07/2023 2355 01/14/2023 0008 Full Code [id number]  Rhetta Mura, DO Inpatient   11/27/2022 1403 12/03/2022 2151 Full Code [id number]  Stark Klein, MD Inpatient   10/02/2022 0837 10/08/2022 2228 Full Code [id number]  Reubin Milan, MD ED   07/13/2013 1033 07/16/2013 2017 Full Code [id number]  Donnie Mesa, MD Inpatient       Home/SNF/Other Home  Chief Complaint Intractable nausea and vomiting [R11.2]  Level of Care/Admitting Diagnosis ED Disposition     ED Disposition  Admit   Condition  --   Comment  Hospital Area: [facility] [id number]  Level of Care: Telemetry [5]  Admit to tele based on following criteria: Monitor for Ischemic changes  May admit patient to [facility] or [facility] if equivalent level of care is available:: No  Covid Evaluation: Asymptomatic - no recent exposure (last 10 days) testing not required  Diagnosis: Intractable nausea and vomiting [id number]  Admitting Physician: Kayleen Memos [id number]  Attending Physician: Kayleen Memos [id number]  Certification:: I certify this patient will need inpatient services for at least 2 midnights  Estimated Length of Stay: 2          Medical History Past Medical History:  Diagnosis Date   Aortic atherosclerosis 10/02/2022   Arthritis    SHOULDER & KNEES   Bilateral cataracts 10/02/2022   Bone cancer    Cataract    bilateral sx   Choledocholithiasis 07/23/2022   Chronic kidney disease    pt states  he does not have kidney disease, just a hx of kidney stones   GERD 02/22/2009   Qualifier: Diagnosis of   By: Carlean Purl MD, Tonna Boehringer E      Glaucoma 10/02/2022   History of kidney stones    Hyperlipidemia 09/06/2021   The 10-year ASCVD risk score (Arnett DK, et al., 2019) is: 12.5%    Values used to calculate the score:      Age: 25 years      Sex: Male      Is Non-Hispanic African American: Yes      Diabetic: No      Tobacco smoker: No      Systolic Blood Pressure: 0000000 mmHg      Is BP treated: No      HDL Cholesterol: 82 mg/dL      Total Cholesterol: 241 mg/dL      Inguinal hernia    right   Intractable nausea and vomiting 01/31/2023   Leukocytosis 10/02/2022   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    Neoplasm of prostate    Pancreatic cancer 2023   Prostate cancer 10/29/1988    Allergies Allergies  Allergen Reactions   Pseudoephedrine Other (See Comments)    Increases ocular pressure   Lupron [Leuprolide] Itching and Other (See Comments)  Caused infection on hip     IV Location/Drains/Wounds Patient Lines/Drains/Airways Status     Active Line/Drains/Airways     Name Placement date Placement time Site Days   Implanted Port 07/31/22 Left Chest 07/31/22  1128  Chest  184   Wound / Incision (Open or Dehisced) 11/29/22 Abdomen Right;Upper;Lateral 11/29/22  1532  Abdomen  63   Wound / Incision (Open or Dehisced) 01/28/23 Puncture Sternum Lower 01/28/23  1219  Sternum  3            Labs/Imaging Results for orders placed or performed during the hospital encounter of 01/31/23 (from the past 48 hour(s))  CBC with Differential     Status: Abnormal   Collection Time: 01/31/23  4:01 AM  Result Value Ref Range   WBC 8.9 4.0 - 10.5 K/uL   RBC 4.31 4.22 - 5.81 MIL/uL   Hemoglobin 12.1 (L) 13.0 - 17.0 g/dL   HCT 36.7 (L) 39.0 - 52.0 %   MCV 85.2 80.0 - 100.0 fL   MCH 28.1 26.0 - 34.0 pg   MCHC 33.0 30.0 - 36.0 g/dL   RDW 16.0 (H) 11.5 - 15.5 %   Platelets 190 150 - 400 K/uL    nRBC 0.0 0.0 - 0.2 %   Neutrophils Relative % 90 %   Neutro Abs 8.1 (H) 1.7 - 7.7 K/uL   Lymphocytes Relative 5 %   Lymphs Abs 0.4 (L) 0.7 - 4.0 K/uL   Monocytes Relative 5 %   Monocytes Absolute 0.4 0.1 - 1.0 K/uL   Eosinophils Relative 0 %   Eosinophils Absolute 0.0 0.0 - 0.5 K/uL   Basophils Relative 0 %   Basophils Absolute 0.0 0.0 - 0.1 K/uL   Immature Granulocytes 0 %   Abs Immature Granulocytes 0.04 0.00 - 0.07 K/uL    Comment: Performed at [facility], [place] [address], [place], [place] [address]  Comprehensive metabolic panel     Status: Abnormal   Collection Time: 01/31/23  4:01 AM  Result Value Ref Range   Sodium 137 135 - 145 mmol/L   Potassium 3.1 (L) 3.5 - 5.1 mmol/L   Chloride 101 98 - 111 mmol/L   CO2 25 22 - 32 mmol/L   Glucose, Bld 144 (H) 70 - 99 mg/dL    Comment: Glucose reference range applies only to samples taken after fasting for at least 8 hours.   BUN 8 8 - 23 mg/dL   Creatinine, Ser 0.48 (L) 0.61 - 1.24 mg/dL   Calcium 8.3 (L) 8.9 - 10.3 mg/dL   Total Protein 5.6 (L) 6.5 - 8.1 g/dL   Albumin 3.0 (L) 3.5 - 5.0 g/dL   AST 23 15 - 41 U/L   ALT 17 0 - 44 U/L   Alkaline Phosphatase 75 38 - 126 U/L   Total Bilirubin 1.3 (H) 0.3 - 1.2 mg/dL   GFR, Estimated >60 >60 mL/min    Comment: (NOTE) Calculated using the CKD-EPI Creatinine Equation (2021)    Anion gap 11 5 - 15    Comment: Performed at [facility], [place] [address] [place], [place] [address]  Lipase, blood     Status: None   Collection Time: 01/31/23  4:01 AM  Result Value Ref Range   Lipase 22 11 - 51 U/L    Comment: Performed at [facility], [place] [address], [place], [place] [address]  Lactic acid, plasma     Status: None   Collection Time:  01/31/23  4:01 AM  Result Value Ref Range   Lactic Acid, Venous 1.4 0.5 - 1.9 mmol/L    Comment: Performed at [facility], [place] [address], [place], [place] [address]  Troponin I  (High Sensitivity)     Status: None   Collection Time: 01/31/23  4:01 AM  Result Value Ref Range   Troponin I (High Sensitivity) 9 <18 ng/L    Comment: (NOTE) Elevated high sensitivity troponin I (hsTnI) values and significant  changes across serial measurements may suggest ACS but many other  chronic and acute conditions are known to elevate hsTnI results.  Refer to the "Links" section for chest pain algorithms and additional  guidance. Performed at [facility], [place] [address], [place], [place] [address]   Magnesium     Status: None   Collection Time: 01/31/23  4:01 AM  Result Value Ref Range   Magnesium 2.0 1.7 - 2.4 mg/dL    Comment: Performed at [facility], [place] [address], [place], [place] [address]  Phosphorus     Status: None   Collection Time: 01/31/23  4:01 AM  Result Value Ref Range   Phosphorus 2.6 2.5 - 4.6 mg/dL    Comment: Performed at [facility], [place] [address], [place], [place] [address]  Troponin I (High Sensitivity)     Status: None   Collection Time: 01/31/23  5:28 AM  Result Value Ref Range   Troponin I (High Sensitivity) 12 <18 ng/L    Comment: (NOTE) Elevated high sensitivity troponin I (hsTnI) values and significant  changes across serial measurements may suggest ACS but many other  chronic and acute conditions are known to elevate hsTnI results.  Refer to the "Links" section for chest pain algorithms and additional  guidance. Performed at [facility] [place] [address], [place], [place] [address]   Urinalysis, Routine w reflex microscopic -Urine, Clean Catch     Status: Abnormal   Collection Time: 01/31/23  7:19 AM  Result Value Ref Range   Color, Urine YELLOW YELLOW   APPearance CLEAR CLEAR   Specific Gravity, Urine 1.041 (H) 1.005 - 1.030   pH 7.0 5.0 - 8.0   Glucose, UA NEGATIVE NEGATIVE mg/dL   Hgb urine dipstick SMALL (A) NEGATIVE   Bilirubin Urine NEGATIVE NEGATIVE    Ketones, ur 80 (A) NEGATIVE mg/dL   Protein, ur NEGATIVE NEGATIVE mg/dL   Nitrite NEGATIVE NEGATIVE   Leukocytes,Ua NEGATIVE NEGATIVE   RBC / HPF 11-20 0 - 5 RBC/hpf   WBC, UA 0-5 0 - 5 WBC/hpf   Bacteria, UA RARE (A) NONE SEEN   Squamous Epithelial / HPF 0-5 0 - 5 /HPF    Comment: Performed at [facility], [place] [address] [place], [place] [address]   CT ABDOMEN PELVIS W CONTRAST  Result Date: 01/31/2023 CLINICAL DATA:  77 year old male with pancreatic cancer undergoing chemotherapy. Recent celiac axis CT-guided nerve block/ablation 01/28/2023. Acute pain. EXAM: CT ABDOMEN AND PELVIS WITH CONTRAST TECHNIQUE: Multidetector CT imaging of the abdomen and pelvis was performed using the standard protocol following bolus administration of intravenous contrast. RADIATION DOSE REDUCTION: This exam was performed according to the departmental dose-optimization program which includes automated exposure control, adjustment of the mA and/or kV according to patient size and/or use of iterative reconstruction technique. CONTRAST:  154mL OMNIPAQUE IOHEXOL 350 MG/ML SOLN COMPARISON:  CTA chest today reported separately. Restaging CT Chest, Abdomen, and Pelvis 12/12/2022. CT-guided images recent celiac plexus  neurolytic ablation 01/28/2023. FINDINGS: Lower chest: Stable to the CTA chest reported separately today. Hepatobiliary: Numerous small hypodense liver metastases were not apparent in February. Gallbladder is chronically absent. Pancreas: Multifocal mixed enhancing and cystic/necrotic tumor of the pancreas (series 2, image 25) appears progressed. Associated pancreatic tail atrophy and marked pancreatic ductal dilatation at the tail. Spleen: Negative. Adrenals/Urinary Tract: Adrenal glands remain within normal limits. There are progressed right pararenal space tumor implants/metastases (series 2, images 21 and 24) which are now up to 1.9 cm (1.1 cm previously). But renal enhancement and contrast  excretion is maintained. No renal parenchymal metastasis. Nephrolithiasis on the right. Bladder appears to remain within normal limits. Stomach/Bowel: Multifocal heterogeneously enhancing, centrally necrotic omental metastases scattered in the abdomen and pelvis have enlarged since February, including the left lower quadrant peritoneal metastasis on series 2, image 61 (now 2.5 cm previously 1.9 cm) and similar perirectal implant on series 2, image 75 (now 1.9 cm previously 1.2 cm). But there is no evidence of associated mechanical bowel obstruction. There is fluid in the stomach and in various small bowel loops, and ileus is possible. But no differential dilatation of bowel. Right colon and transverse colon are decompressed. Large bowel inflammation in those segments is difficult to exclude. There is no pneumoperitoneum. No free fluid identified. Vascular/Lymphatic: Normal caliber abdominal aorta. Major arterial structures in the abdomen and pelvis remain patent. Aortoiliac calcified atherosclerosis. Portal venous system remains patent, although there is mass effect on the SMV confluence which is probably tumor related (series 2, image 26). Necrotic left retroperitoneal, pararenal lymphadenopathy has progressed since February now up to 4.2 cm on series 2, image 27, 2.6 cm previously. Reproductive: Nonspecific heterogeneity in the left inguinal canal is more conspicuous since February. No regional in quinolone lymphadenopathy. Other: No pelvis free fluid. Extensive bilateral pelvic sidewall surgical clips appear stable. Musculoskeletal: Stable visualized osseous structures. No acute or suspicious osseous lesion is identified. IMPRESSION: 1. Progressed Primary and Metastatic Pancreatic Cancer since February. New liver metastases. Increasing omental, and retroperitoneal metastatic disease. Ileus is possible, but there is no evidence of mechanical bowel obstruction despite multifocal omental implants. 2. CTA Chest  reported separately today. Electronically Signed   By: Genevie Ann M.D.   On: 01/31/2023 06:15   CT Angio Chest PE W and/or Wo Contrast  Result Date: 01/31/2023 CLINICAL DATA:  77 year old male with pancreatic cancer undergoing chemotherapy. Recent celiac axis CT-guided nerve block/ablation 01/28/2023. Acute pain. EXAM: CT ANGIOGRAPHY CHEST WITH CONTRAST TECHNIQUE: Multidetector CT imaging of the chest was performed using the standard protocol during bolus administration of intravenous contrast. Multiplanar CT image reconstructions and MIPs were obtained to evaluate the vascular anatomy. RADIATION DOSE REDUCTION: This exam was performed according to the departmental dose-optimization program which includes automated exposure control, adjustment of the mA and/or kV according to patient size and/or use of iterative reconstruction technique. CONTRAST:  144mL OMNIPAQUE IOHEXOL 350 MG/ML SOLN COMPARISON:  CT Abdomen and Pelvis today reported separately. Restaging CT Chest, Abdomen, and Pelvis 12/12/2022. FINDINGS: Cardiovascular: Good contrast bolus timing in the pulmonary arterial tree. No pulmonary artery filling defect. Heart size at the upper limits of normal. No pericardial effusion. Negative thoracic aorta aside from mild tortuosity. Stable left chest Port-A-Cath. Mediastinum/Nodes: No mediastinal mass or lymphadenopathy. Nonspecific mild to moderate generalized esophageal wall thickening (series 4, image 28) is new or increased since February. Lungs/Pleura: Lower lung volumes compared to February. Mild respiratory motion. Central airways remain patent. Dependent and also platelike lower lobe atelectasis greater on  the left. No new or suspicious pulmonary nodule. No pleural effusion or convincing lung inflammation. Upper Abdomen: Reported separately today. Musculoskeletal: Stable visualized osseous structures. Dextroconvex thoracic scoliosis. Review of the MIP images confirms the above findings. IMPRESSION: 1.  Negative for acute pulmonary embolus. Lower lung volumes with atelectasis. 2. Mild to moderate generalized esophageal wall thickening appears new or increased since February. Consider acute Esophagitis. 3. CT Abdomen and Pelvis today reported separately. Electronically Signed   By: Genevie Ann M.D.   On: 01/31/2023 06:04    Pending Labs Unresulted Labs (From admission, onward)     Start     Ordered   02/07/23 0500  Creatinine, serum  (enoxaparin (LOVENOX)    CrCl >/= 30 ml/min)  Weekly,   R     Comments: while on enoxaparin therapy    01/31/23 0956            Vitals/Pain Today's Vitals   01/31/23 0919 01/31/23 1038 01/31/23 1057 01/31/23 1232  BP:  106/66    Pulse:  67    Resp:  18    Temp:  98.6 F (37 C)    TempSrc:  Oral    SpO2:  97%    Weight:      Height:      PainSc: 2   1  1      Isolation Precautions No active isolations  Medications Medications  HYDROmorphone (DILAUDID) injection 1 mg (1 mg Intravenous Given 01/31/23 1230)  0.9 % NaCl with KCl 20 mEq/ L  infusion ( Intravenous New Bag/Given 01/31/23 0836)  pantoprazole (PROTONIX) injection 40 mg (40 mg Intravenous Given 01/31/23 0922)  prochlorperazine (COMPAZINE) injection 10 mg (has no administration in time range)  ondansetron (ZOFRAN) injection 4 mg (has no administration in time range)  enoxaparin (LOVENOX) injection 40 mg (has no administration in time range)  acetaminophen (TYLENOL) tablet 650 mg (has no administration in time range)    Or  acetaminophen (TYLENOL) suppository 650 mg (has no administration in time range)  lactated ringers bolus 1,000 mL (0 mLs Intravenous Stopped 01/31/23 0500)  ondansetron (ZOFRAN) injection 4 mg (4 mg Intravenous Given 01/31/23 0413)  HYDROmorphone (DILAUDID) injection 1 mg (1 mg Intravenous Given 01/31/23 0415)  iohexol (OMNIPAQUE) 350 MG/ML injection 100 mL (100 mLs Intravenous Contrast Given 01/31/23 0512)  potassium chloride 10 mEq in 100 mL IVPB (0 mEq Intravenous Stopped 01/31/23  0735)  HYDROmorphone (DILAUDID) injection 1 mg (1 mg Intravenous Given 01/31/23 0633)  lactated ringers bolus 1,000 mL (0 mLs Intravenous Stopped 01/31/23 0839)  magnesium sulfate IVPB 2 g 50 mL (0 g Intravenous Stopped 01/31/23 1059)    Mobility walks

## 2023-02-01 ENCOUNTER — Inpatient Hospital Stay: Payer: Medicare HMO | Admitting: Nurse Practitioner

## 2023-02-01 DIAGNOSIS — G893 Neoplasm related pain (acute) (chronic): Secondary | ICD-10-CM

## 2023-02-01 DIAGNOSIS — E440 Moderate protein-calorie malnutrition: Secondary | ICD-10-CM

## 2023-02-01 DIAGNOSIS — K567 Ileus, unspecified: Secondary | ICD-10-CM

## 2023-02-01 DIAGNOSIS — C251 Malignant neoplasm of body of pancreas: Secondary | ICD-10-CM | POA: Diagnosis not present

## 2023-02-01 DIAGNOSIS — R1084 Generalized abdominal pain: Secondary | ICD-10-CM | POA: Diagnosis not present

## 2023-02-01 DIAGNOSIS — D638 Anemia in other chronic diseases classified elsewhere: Secondary | ICD-10-CM

## 2023-02-01 DIAGNOSIS — E876 Hypokalemia: Secondary | ICD-10-CM

## 2023-02-01 DIAGNOSIS — H409 Unspecified glaucoma: Secondary | ICD-10-CM

## 2023-02-01 DIAGNOSIS — R112 Nausea with vomiting, unspecified: Secondary | ICD-10-CM | POA: Diagnosis not present

## 2023-02-01 LAB — RENAL FUNCTION PANEL
Albumin: 3 g/dL — ABNORMAL LOW (ref 3.5–5.0)
Anion gap: 10 (ref 5–15)
BUN: 7 mg/dL — ABNORMAL LOW (ref 8–23)
CO2: 21 mmol/L — ABNORMAL LOW (ref 22–32)
Calcium: 8 mg/dL — ABNORMAL LOW (ref 8.9–10.3)
Chloride: 108 mmol/L (ref 98–111)
Creatinine, Ser: 0.48 mg/dL — ABNORMAL LOW (ref 0.61–1.24)
GFR, Estimated: >60 mL/min (ref >60)
Glucose, Bld: 137 mg/dL — ABNORMAL HIGH (ref 70–99)
Phosphorus: 2.4 mg/dL — ABNORMAL LOW (ref 2.5–4.6)
Potassium: 4 mmol/L (ref 3.5–5.1)
Sodium: 139 mmol/L (ref 135–145)

## 2023-02-01 LAB — CANCER ANTIGEN 19-9: CA 19-9: 83764 U/mL — ABNORMAL HIGH (ref 0–35)

## 2023-02-01 LAB — MAGNESIUM
Magnesium: 0.7 mg/dL — CL (ref 1.7–2.4)
Magnesium: 2.1 mg/dL (ref 1.7–2.4)

## 2023-02-01 LAB — CBC
HCT: 40.4 % (ref 39.0–52.0)
Hemoglobin: 12.6 g/dL — ABNORMAL LOW (ref 13.0–17.0)
MCH: 27.6 pg (ref 26.0–34.0)
MCHC: 31.2 g/dL (ref 30.0–36.0)
MCV: 88.6 fL (ref 80.0–100.0)
Platelets: 154 10*3/uL (ref 150–400)
RBC: 4.56 MIL/uL (ref 4.22–5.81)
RDW: 16.3 % — ABNORMAL HIGH (ref 11.5–15.5)
WBC: 11.4 10*3/uL — ABNORMAL HIGH (ref 4.0–10.5)
nRBC: 0 % (ref 0.0–0.2)

## 2023-02-01 MED ORDER — MAGNESIUM SULFATE 2 GM/50ML IV SOLN: 2.0000 g | Freq: Once | INTRAVENOUS | Status: DC

## 2023-02-01 MED ORDER — PREDNISONE 5 MG PO TABS
10.0000 mg | ORAL_TABLET | Freq: Every day | ORAL | Status: DC
  Administered 2023-02-01 – 2023-02-04 (×4): 10 mg via ORAL
  Filled 2023-02-01 (×4): qty 2

## 2023-02-01 MED ORDER — FENTANYL 100 MCG/HR TD PT72
1.0000 | MEDICATED_PATCH | TRANSDERMAL | Status: DC
  Administered 2023-02-01 – 2023-02-04 (×2): 1 via TRANSDERMAL
  Filled 2023-02-01 (×2): qty 1

## 2023-02-01 MED ORDER — HEPARIN SOD (PORK) LOCK FLUSH 100 UNIT/ML IV SOLN
500.0000 [IU] | Freq: Once | INTRAVENOUS | Status: AC
  Administered 2023-02-01: 500 [IU] via INTRAVENOUS
  Filled 2023-02-01: qty 5

## 2023-02-01 MED ORDER — BRIMONIDINE TARTRATE 0.15 % OP SOLN
1.0000 [drp] | Freq: Two times a day (BID) | OPHTHALMIC | Status: DC
  Administered 2023-02-01 – 2023-02-04 (×7): 1 [drp] via OPHTHALMIC
  Filled 2023-02-01: qty 5

## 2023-02-01 MED ORDER — TIMOLOL MALEATE 0.5 % OP SOLN
1.0000 [drp] | Freq: Two times a day (BID) | OPHTHALMIC | Status: DC
  Filled 2023-02-01: qty 5

## 2023-02-01 MED ORDER — CARBOXYMETHYLCELL-GLYCERIN PF 0.5-0.9 % OP SOLN
1.0000 [drp] | Freq: Two times a day (BID) | OPHTHALMIC | Status: DC
  Administered 2023-02-01 – 2023-02-04 (×7): 1 [drp] via OPHTHALMIC

## 2023-02-01 MED ORDER — BOOST / RESOURCE BREEZE PO LIQD CUSTOM
1.0000 | Freq: Three times a day (TID) | ORAL | Status: DC
  Administered 2023-02-01: 1 via ORAL

## 2023-02-01 MED ORDER — CARBOXYMETHYLCELL-GLYCERIN PF 0.5-0.9 % OP SOLN: 1.0000 [drp] | Freq: Two times a day (BID) | OPHTHALMIC | Status: DC

## 2023-02-01 MED ORDER — DORZOLAMIDE HCL-TIMOLOL MAL PF 2-0.5 % OP SOLN: 1.0000 [drp] | Freq: Two times a day (BID) | OPHTHALMIC | Status: DC

## 2023-02-01 MED ORDER — POLYVINYL ALCOHOL 1.4 % OP SOLN
1.0000 [drp] | Freq: Two times a day (BID) | OPHTHALMIC | Status: DC
  Filled 2023-02-01: qty 15

## 2023-02-01 MED ORDER — MAGNESIUM SULFATE 4 GM/100ML IV SOLN
4.0000 g | Freq: Once | INTRAVENOUS | Status: DC
  Filled 2023-02-01: qty 100

## 2023-02-01 MED ORDER — DORZOLAMIDE HCL 2 % OP SOLN
1.0000 [drp] | Freq: Two times a day (BID) | OPHTHALMIC | Status: DC
  Filled 2023-02-01: qty 10

## 2023-02-01 MED ORDER — DORZOLAMIDE HCL-TIMOLOL MAL PF 2-0.5 % OP SOLN
1.0000 [drp] | Freq: Two times a day (BID) | OPHTHALMIC | Status: DC
  Administered 2023-02-01 – 2023-02-04 (×7): 1 [drp] via OPHTHALMIC

## 2023-02-01 NOTE — TOC Progression Note (Signed)
Transition of Care [facility]) - Progression Note    Patient Details  Name: Jared [name] MRN: [id number] Date of Birth: 09/26/1946  Transition of Care [facility]) CM/SW Contact  Beckie Busing, RN Phone Number:[phone]  02/01/2023, 2:33 PM  Clinical Narrative:     Transition of Care [facility]) Screening Note   Patient Details  Name: Jared [name] Date of Birth: December 04, 1945   Transition of Care [facility]) CM/SW Contact:    Beckie Busing, RN Phone Number: 02/01/2023, 2:34 PM    Transition of Care Department [facility]) has reviewed patient and no TOC needs have been identified at this time. We will continue to monitor patient advancement through interdisciplinary progression rounds. If new patient transition needs arise, please place a TOC consult.       Barriers to Discharge: Continued Medical Work up  Expected Discharge Plan and Services                                               Social Determinants of Health (SDOH) Interventions SDOH Screenings   Food Insecurity: No Food Insecurity (01/31/2023)  Housing: Low Risk  (01/31/2023)  Transportation Needs: No Transportation Needs (01/31/2023)  Utilities: Not At Risk (01/31/2023)  Depression (PHQ2-9): Low Risk  (10/16/2022)  Financial Resource Strain: Low Risk  (07/26/2022)  Social Connections: Socially Integrated (07/26/2022)  Tobacco Use: Medium Risk (01/31/2023)    Readmission Risk Interventions    02/01/2023    2:28 PM 01/10/2023   10:22 AM  Readmission Risk Prevention Plan  Transportation Screening Complete Complete  Medication Review (RN Care Manager) Complete Complete  PCP or Specialist appointment within 3-5 days of discharge Complete Complete  HRI or Home Care Consult Complete Complete  SW Recovery Care/Counseling Consult Complete Complete  Palliative Care Screening Not Applicable Not Applicable  Skilled Nursing Facility Not Applicable Not Applicable

## 2023-02-01 NOTE — Progress Notes (Signed)
Initial Nutrition Assessment  DOCUMENTATION CODES:   Non-severe (moderate) malnutrition in context of chronic illness  INTERVENTION:   -Boost Breeze po TID, each supplement provides 250 kcal and 9 grams of protein   Once diet advanced: - Boost Plus TID, each supplement provides 360 kcal and 14 grams of protein.    NUTRITION DIAGNOSIS:   Moderate Malnutrition related to chronic illness, cancer and cancer related treatments as evidenced by mild fat depletion, mild muscle depletion, percent weight loss.  GOAL:   Patient will meet greater than or equal to 90% of their needs  MONITOR:   PO intake, Supplement acceptance, Diet advancement, Labs, Weight trends, I & O's  REASON FOR ASSESSMENT:   Other (Comment) (Verbal consult from nursing director)    ASSESSMENT:   77 y.o. male with medical history significant of aortic atherosclerosis, cataracts, osteoarthritis, choledocholithiasis, urolithiasis, GERD, glaucoma, hyperlipidemia, inguinal hernia, leukocytosis, prostate cancer, metastatic pancreatic cancer with chronic abdominal pain, celiac plexus block 2 days ago who underwent chemotherapy yesterday with gemcitabine/Abraxan coming to the emergency department with abdominal pain, multiple episodes of nausea and emesis.  Patient in room, daughter at bedside. Pt reports just receiving his dose of Zofran and he plans to start eating some clear liquids after 30 minutes or so. Pt started having N/V after his last chemotherapy treatment on 4/3. Was last seen by [facility] on 4/3 during infusion, pt was noted to have thrush and appetite had improved at that time. Drinking Boost Plus BID at home.  Pt states he is agreeable to drinking Boost Breeze while on clear liquid diet. Would like Boost Plus once diet is advanced further. Denies any issues with swallowing or chewing.   Reports UBW of ~235 lbs.  Pt has lost 35 lbs since July 2023 (14% wt loss x 8 months, significant for time frame).    Medications: IV Zofran  Labs reviewed: Low Phos  NUTRITION - FOCUSED PHYSICAL EXAM:  Flowsheet Row Most Recent Value  Orbital Region Mild depletion  Upper Arm Region No depletion  Thoracic and Lumbar Region Unable to assess  Buccal Region Mild depletion  Temple Region Mild depletion  Clavicle Bone Region Mild depletion  Clavicle and Acromion Bone Region Mild depletion  Scapular Bone Region Mild depletion  Dorsal Hand Mild depletion  Patellar Region No depletion  Anterior Thigh Region No depletion  Posterior Calf Region No depletion  Edema (RD Assessment) Mild  Hair Reviewed  Eyes Reviewed  Mouth Reviewed  Skin Reviewed  Nails Reviewed       Diet Order:   Diet Order             Diet clear liquid Room service appropriate? Yes; Fluid consistency: Thin  Diet effective now                   EDUCATION NEEDS:   No education needs have been identified at this time  Skin:  Skin Assessment: Reviewed RN Assessment  Last BM:  4/4 -type 6  Height:   Ht Readings from Last 1 Encounters:  01/31/23 6' (1.829 m)    Weight:   Wt Readings from Last 1 Encounters:  01/31/23 91 kg     BMI:  Body mass index is 27.21 kg/m.  Estimated Nutritional Needs:   Kcal:  2200-2400  Protein:  110-125g  Fluid:  2.2L/day   Tilda Franco, MS, RD, LDN Inpatient Clinical Dietitian Contact information available via Amion

## 2023-02-01 NOTE — Progress Notes (Signed)
Mobility Specialist - Progress Note   02/01/23 1439  Mobility  Activity  (Seated Exercises)  Level of Assistance Independent  Assistive Device None  Range of Motion/Exercises Right arm;Left arm;Right leg;Left leg  Activity Response Tolerated well  Mobility Referral Yes  $Mobility charge 1 Mobility   Pt received in recliner and agreeable to do seated BLE exercises. No complaints during session. Pt to recliner after session w/ all needs met.      Seated BLE Exercises: 10 reps each  1) Toe Raise/ Heel Raise  2) Knee Extension (hold 3 seconds)  3) Marching      Chief Technology Officer

## 2023-02-01 NOTE — Progress Notes (Signed)
PROGRESS NOTE  Jared [name] ZLD:[id number] DOB: 1946/03/16   PCP: Carney Living, MD  Patient is from: Home.  Lives with his wife.  DOA: 01/31/2023 LOS: 1  Chief complaints Chief Complaint  Patient presents with   Abdominal Pain   Emesis     Brief Narrative / Interim history: 77 year old M with PMH of metastatic pancreatic cancer on chemo, chronic abdominal pain s/p celiac plexus block 2 days prior to presentation, choledocholithiasis, urolithiasis, prostate cancer, osteoarthritis, GERD and HLD presented to ED with abdominal pain, multiple episodes of nausea and emesis, and admitted for intractable nausea, vomiting and abdominal pain.  CT abdomen and pelvis concerning for progression of metastatic pancreatic cancer since February with new liver mets, increased omental and retroperitoneal metastasis, and esophageal wall thickening suggesting esophagitis.   Given progression of cancer despite treatment, palliative medicine consulted.    Subjective: Seen and examined earlier this morning.  No major events overnight of this morning.  No further emesis since admission.  Abdominal pain improved.  He rates his pain 5/10.  Continues to endorse nausea.  Very anxious to advance diet to full liquid diet.  Patient's wife at bedside.  Interested in optimizing pain control and quality of life since malignancy progress despite treatment.  Objective: Vitals:   01/31/23 1458 02/01/23 0010 02/01/23 0312 02/01/23 1329  BP: 116/74 111/76 119/79 117/85  Pulse: 65 85 90 87  Resp: 18  14   Temp: 98.2 F (36.8 C) 99.4 F (37.4 C) 99 F (37.2 C) 98.4 F (36.9 C)  TempSrc: Oral Oral Oral Oral  SpO2: 96% 96% 96% 99%  Weight:      Height:        Examination:  GENERAL: No apparent distress.  Nontoxic. HEENT: MMM.  Vision and hearing grossly intact.  NECK: Supple.  No apparent JVD.  RESP:  No IWOB.  Fair aeration bilaterally. CVS:  RRR. Heart sounds normal.  ABD/GI/GU: BS+. Abd soft, NTND.   MSK/EXT:  Moves extremities. No apparent deformity. No edema.  SKIN: no apparent skin lesion or wound NEURO: Awake, alert and oriented appropriately.  No apparent focal neuro deficit. PSYCH: Calm. Normal affect.   Procedures:  None  Microbiology summarized: None  Assessment and plan: Principal Problem:   Intractable nausea and vomiting Active Problems:   GERD (gastroesophageal reflux disease)   Generalized abdominal pain   Cancer of pancreas, body   Glaucoma   Anemia of chronic disease   Ileus   Hypokalemia   Malnutrition of moderate degree   Metastatic pancreatic cancer/cancer related abdominal pain: CT with progression including new liver mets and increased omental and retroperitoneal metastasis.  Patient was on chemo with gemcitabine/Abraxan.  Last treatment the day before admission.  Followed by Dr. Truett Perna and palliative medicine -Added oncology to treatment team -Palliative medicine consulted -Continue current regimen for pain control  Intractable nausea and vomiting: Could be due to malignancy and possibly chemo.  No further emesis since admission.  Still with nausea. -Continue antiemetics -Continue IV fluid  Possible ileus?  Patient reports regular bowel movements. -Ambulate in the hallway -Bowel regimen with opioids  GERD: CT with esophageal wall thickening suggesting esophagitis.  Cannot rule out metastasis -Continue PPI  Anemia of chronic disease: H&H stable.  Hypokalemia -Monitor replenish as appropriate  Glaucoma -Continue eyedrops  Goal of care: After discussion about CT finding, patient and patient's wife would like to focus on pain control and quality of life -Palliative medicine consulted.  Moderate malnutrition Body mass index  is 27.21 kg/m. Nutrition Problem: Moderate Malnutrition Etiology: chronic illness, cancer and cancer related treatments Signs/Symptoms: mild fat depletion, mild muscle depletion, percent weight loss Interventions:  Boost Breeze   DVT prophylaxis:  enoxaparin (LOVENOX) injection 40 mg Start: 01/31/23 2200  Code Status: Full code Family Communication: Updated patient's wife at bedside Level of care: Telemetry Status is: Inpatient Remains inpatient appropriate because: Intractable nausea and vomiting, cancer related pain   Final disposition: TBD Consultants:  Oncology Palliative medicine  55 minutes with more than 50% spent in reviewing records, counseling patient/family and coordinating care.   Sch Meds:  Scheduled Meds:  brimonidine  1 drop Left Eye BID AC & HS   Carboxymethylcell-Glycerin PF  1 drop Ophthalmic BID   Chlorhexidine Gluconate Cloth  6 each Topical Daily   Dorzolamide HCl-Timolol Mal PF  1 drop Ophthalmic BID   enoxaparin (LOVENOX) injection  40 mg Subcutaneous Q24H   feeding supplement  1 Container Oral TID WC   fentaNYL  1 patch Transdermal Q72H    HYDROmorphone (DILAUDID) injection  1 mg Intravenous Q2H   pantoprazole (PROTONIX) IV  40 mg Intravenous Q24H   predniSONE  10 mg Oral Q breakfast   Continuous Infusions:  0.9 % NaCl with KCl 20 mEq / L 100 mL/hr at 02/01/23 1201   PRN Meds:.acetaminophen **OR** acetaminophen, ondansetron (ZOFRAN) IV, prochlorperazine  Antimicrobials: Anti-infectives (From admission, onward)    None        I have personally reviewed the following labs and images: CBC: Recent Labs  Lab 01/28/23 0958 01/30/23 0921 01/31/23 0401 02/01/23 1305  WBC 6.4 7.0 8.9 11.4*  NEUTROABS  --  5.0 8.1*  --   HGB 11.5* 11.4* 12.1* 12.6*  HCT 36.3* 34.7* 36.7* 40.4  MCV 87.5 85.5 85.2 88.6  PLT 241 198 190 154   BMP &GFR Recent Labs  Lab 01/30/23 0921 01/31/23 0401 02/01/23 1047 02/01/23 1305  NA 138 137  --  139  K 3.8 3.1*  --  4.0  CL 103 101  --  108  CO2 29 25  --  21*  GLUCOSE 139* 144*  --  137*  BUN 10 8  --  7*  CREATININE 0.51* 0.48*  --  0.48*  CALCIUM 8.8* 8.3*  --  8.0*  MG  --  2.0 0.7* 2.1  PHOS  --  2.6  --   2.4*   Estimated Creatinine Clearance: 84.9 mL/min (A) (by C-G formula based on SCr of 0.48 mg/dL (L)). Liver & Pancreas: Recent Labs  Lab 01/30/23 0921 01/31/23 0401 02/01/23 1305  AST 19 23  --   ALT 15 17  --   ALKPHOS 79 75  --   BILITOT 0.5 1.3*  --   PROT 5.5* 5.6*  --   ALBUMIN 3.3* 3.0* 3.0*   Recent Labs  Lab 01/31/23 0401  LIPASE 22   No results for input(s): "AMMONIA" in the last 168 hours. Diabetic: No results for input(s): "HGBA1C" in the last 72 hours. No results for input(s): "GLUCAP" in the last 168 hours. Cardiac Enzymes: No results for input(s): "CKTOTAL", "CKMB", "CKMBINDEX", "TROPONINI" in the last 168 hours. No results for input(s): "PROBNP" in the last 8760 hours. Coagulation Profile: Recent Labs  Lab 01/28/23 0958  INR 1.1   Thyroid Function Tests: No results for input(s): "TSH", "T4TOTAL", "FREET4", "T3FREE", "THYROIDAB" in the last 72 hours. Lipid Profile: No results for input(s): "CHOL", "HDL", "LDLCALC", "TRIG", "CHOLHDL", "LDLDIRECT" in the last 72 hours. Anemia Panel:  No results for input(s): "VITAMINB12", "FOLATE", "FERRITIN", "TIBC", "IRON", "RETICCTPCT" in the last 72 hours. Urine analysis:    Component Value Date/Time   COLORURINE YELLOW 01/31/2023 0719   APPEARANCEUR CLEAR 01/31/2023 0719   LABSPEC 1.041 (H) 01/31/2023 0719   PHURINE 7.0 01/31/2023 0719   GLUCOSEU NEGATIVE 01/31/2023 0719   HGBUR SMALL (A) 01/31/2023 0719   BILIRUBINUR NEGATIVE 01/31/2023 0719   KETONESUR 80 (A) 01/31/2023 0719   PROTEINUR NEGATIVE 01/31/2023 0719   UROBILINOGEN 1.0 07/13/2013 0342   NITRITE NEGATIVE 01/31/2023 0719   LEUKOCYTESUR NEGATIVE 01/31/2023 0719   Sepsis Labs: Invalid input(s): "PROCALCITONIN", "LACTICIDVEN"  Microbiology: No results found for this or any previous visit (from the past 240 hour(s)).  Radiology Studies: No results found.    Achille Xiang T. Aayliah Rotenberry [facility]  If 7PM-7AM, please contact  night-coverage [email or web address] 02/01/2023, 3:11 PM

## 2023-02-01 NOTE — Progress Notes (Signed)
Chaplain engaged in an initial visit with Tico and his wife. Chaplain was able to learn about Iori's healthcare journey. Bryen expressed how hard it has been but how he finds strength in his family, prayer, and faith. Zeeshan is working to be strong for his wife, kids, and grandchildren. Duanne and his wife work to find joy and blessings in each day even when it is hard. They uplifted a gratefulness in [place] being able to still sleep regularly. Chaplain affirmed their outlook, and also uplifted the ability to be honest about where they are when things feel really tough and hard.   Chaplain offered a blessing of strength and peace over [employer]. Chaplain also worked to offer encouragement as wife expressed a need for it. Chaplain was able to be a reflective and compassionate presence.   Chaplain Sumayya Muha, MDiv  02/01/23 1600  Spiritual Encounters  Type of Visit Initial  Care provided to: Pt and family  Reason for visit Routine spiritual support

## 2023-02-02 DIAGNOSIS — R112 Nausea with vomiting, unspecified: Secondary | ICD-10-CM | POA: Diagnosis not present

## 2023-02-02 DIAGNOSIS — C251 Malignant neoplasm of body of pancreas: Secondary | ICD-10-CM | POA: Diagnosis not present

## 2023-02-02 DIAGNOSIS — E440 Moderate protein-calorie malnutrition: Secondary | ICD-10-CM | POA: Diagnosis not present

## 2023-02-02 DIAGNOSIS — R1084 Generalized abdominal pain: Secondary | ICD-10-CM | POA: Diagnosis not present

## 2023-02-02 DIAGNOSIS — Z66 Do not resuscitate: Secondary | ICD-10-CM | POA: Insufficient documentation

## 2023-02-02 LAB — MAGNESIUM: Magnesium: 2.2 mg/dL (ref 1.7–2.4)

## 2023-02-02 LAB — RENAL FUNCTION PANEL
Albumin: 2.7 g/dL — ABNORMAL LOW (ref 3.5–5.0)
Anion gap: 6 (ref 5–15)
BUN: 7 mg/dL — ABNORMAL LOW (ref 8–23)
CO2: 25 mmol/L (ref 22–32)
Calcium: 8 mg/dL — ABNORMAL LOW (ref 8.9–10.3)
Chloride: 108 mmol/L (ref 98–111)
Creatinine, Ser: 0.39 mg/dL — ABNORMAL LOW (ref 0.61–1.24)
GFR, Estimated: >60 mL/min
Glucose, Bld: 118 mg/dL — ABNORMAL HIGH (ref 70–99)
Phosphorus: 1.8 mg/dL — ABNORMAL LOW (ref 2.5–4.6)
Potassium: 3.8 mmol/L (ref 3.5–5.1)
Sodium: 139 mmol/L (ref 135–145)

## 2023-02-02 LAB — CBC
HCT: 34.6 % — ABNORMAL LOW (ref 39.0–52.0)
Hemoglobin: 11.3 g/dL — ABNORMAL LOW (ref 13.0–17.0)
MCH: 28.2 pg (ref 26.0–34.0)
MCHC: 32.7 g/dL (ref 30.0–36.0)
MCV: 86.3 fL (ref 80.0–100.0)
Platelets: 149 10*3/uL — ABNORMAL LOW (ref 150–400)
RBC: 4.01 MIL/uL — ABNORMAL LOW (ref 4.22–5.81)
RDW: 16.3 % — ABNORMAL HIGH (ref 11.5–15.5)
WBC: 9.8 10*3/uL (ref 4.0–10.5)
nRBC: 0 % (ref 0.0–0.2)

## 2023-02-02 MED ORDER — PHENOL 1.4 % MT LIQD
1.0000 | OROMUCOSAL | Status: DC | PRN
  Administered 2023-02-02: 1 via OROMUCOSAL
  Filled 2023-02-02: qty 177

## 2023-02-02 MED ORDER — HYDROCORTISONE 1 % EX CREA
1.0000 | TOPICAL_CREAM | Freq: Three times a day (TID) | CUTANEOUS | Status: DC | PRN
  Administered 2023-02-02: 1 via TOPICAL
  Filled 2023-02-02: qty 28

## 2023-02-02 MED ORDER — DIPHENHYDRAMINE HCL 25 MG PO CAPS
25.0000 mg | ORAL_CAPSULE | Freq: Three times a day (TID) | ORAL | Status: DC | PRN
  Administered 2023-02-02 – 2023-02-03 (×2): 25 mg via ORAL
  Filled 2023-02-02 (×2): qty 1

## 2023-02-02 MED ORDER — HYDROMORPHONE HCL 1 MG/ML IJ SOLN: 1.0000 mg | INTRAMUSCULAR | Status: DC | PRN

## 2023-02-02 MED ORDER — POTASSIUM PHOSPHATES 15 MMOLE/5ML IV SOLN
30.0000 mmol | Freq: Once | INTRAVENOUS | Status: AC
  Administered 2023-02-02: 30 mmol via INTRAVENOUS
  Filled 2023-02-02: qty 10

## 2023-02-02 MED ORDER — HYDROMORPHONE HCL 4 MG PO TABS: 4.0000 mg | ORAL_TABLET | ORAL | Status: DC

## 2023-02-02 MED ORDER — HYDROMORPHONE HCL 4 MG PO TABS
8.0000 mg | ORAL_TABLET | ORAL | Status: DC
  Administered 2023-02-02 – 2023-02-04 (×13): 8 mg via ORAL
  Filled 2023-02-02 (×13): qty 2

## 2023-02-02 NOTE — Progress Notes (Signed)
IP PROGRESS NOTE  Subjective:   Jared [name] is well-known to me with a history of metastatic pancreas cancer.  He completed another cycle of gemcitabine/Abraxane on 01/30/2023.  He underwent a celiac plexus block on 01/28/2023.  He developed acute nausea/vomiting and increased abdominal pain and presented to the emergency room on 01/31/2023.  He reports large-volume emesis at home. He continues to have lower abdominal pain. A CT on 01/31/2023 revealed new liver metastases and progressive omental/peritoneal metastases.  No evidence of a bowel obstruction.  Objective: Vital signs in last 24 hours: Blood pressure 125/85, pulse 73, temperature 97.9 F (36.6 C), temperature source Oral, resp. rate 16, height 6' (1.829 m), weight 200 lb 9.6 oz (91 kg), SpO2 99 %.  Intake/Output from previous day: 04/05 0701 - 04/06 0700 In: 3301.8 [P.O.:914; I.V.:2387.8] Out: 700 [Urine:700]  Physical Exam:  HEENT: No thrush or ulcers Lungs: Clear bilaterally Cardiac: Regular rate and rhythm Abdomen: No hepatosplenomegaly, no mass, no apparent ascites, soft, nontender Extremities: No leg edema Skin: Hyperpigmentation at the medial left thigh extending to the left lower leg  Portacath/PICC-without erythema  Lab Results: Recent Labs    02/01/23 1305 02/02/23 0415  WBC 11.4* 9.8  HGB 12.6* 11.3*  HCT 40.4 34.6*  PLT 154 149*    BMET Recent Labs    02/01/23 1305 02/02/23 0415  NA 139 139  K 4.0 3.8  CL 108 108  CO2 21* 25  GLUCOSE 137* 118*  BUN 7* 7*  CREATININE 0.48* 0.39*  CALCIUM 8.0* 8.0*    Lab Results  Component Value Date   VZC588 83,764 (H) 01/30/2023   Medications: I have reviewed the patient's current medications.  Assessment/Plan: Pancreas cancer FNA biopsy of a pancreas body/tail mass 07/05/2022-adenocarcinoma CT abdomen/pelvis 06/14/2022-hypoenhancing pancreas body/tail mass with effacement of the splenic vein, no evidence of lymphadenopathy or metastatic disease CA 19-9 on  06/18/2022-767 EUS 07/05/2022-a 7 x 29 mm pancreas body/tail mass, abutment of the splenic artery, no malignant appearing lymph nodes, T2N0 by EUS CTs 07/15/2022-pancreas body/tail mass with no evidence of metastatic disease, splenic vein associated with the tumor, no arterial involvement, stable right greater than left lung nodules favored benign Cycle 1 FOLFOX 08/01/2022 Cycle 2 FOLFOX, 08/16/2022, neulasta Cycle 3 FOLFIRINOX 08/29/2022, Neulasta Cycle 4 FOLFIRINOX 09/12/2022, Neulasta, irinotecan and 5-fluorouracil dose reduced due to diarrhea/weight loss Cycle 5 FOLFIRINOX 09/26/2022 CT abdomen/pelvis 10/09/2022-no significant change in the appearance of the ill-defined pancreatic tail mass.  Persistent extrinsic mass effect on the splenic vein which remains patent.  Indeterminate left adrenal nodule. 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  Foundation 1-microsatellite stable, tumor mutation burden 1, K-ras G12R CTs 12/12/2022-increasing size of pancreatic mass with suspected metastatic disease involving the lesser sac and adjacent stomach.  New area of masslike nodularity anterior to the pancreatic neck appears to tether the gastric antrum.  Worsening of peritoneal disease along the inferior surface of the right hepatic margin.  Lesion along the margin of the sigmoid colon is favored to represent a tumor implant.  Enlarging left adrenal gland in close proximity to the pancreatic lesion.  Scattered more conspicuous areas of peritoneal and omental involvement.  Vascular involvement seen on previous imaging most notably of the splenic vein and splenic artery. Cycle 1 gemcitabine/Abraxane 12/13/2022 Chemotherapy held 12/27/2022 due to mild neutropenia Cycle 2 gemcitabine/Abraxane 01/03/2023 Cycle 3 gemcitabine/Abraxane 01/30/2023 CT abdomen/pelvis 01/31/2023-progression of  carcinomatosis and primary pancreas mass, new  liver metastases Prostate cancer, status post prostatectomy 1990 Chronic elevation of the PSA-maintained on Trelstar, followed by Dr. Annabell Howells   3.  Kidney stones 4.   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  Abdominal pain, secondary to #1 Celiac plexus block 11/29/2022 Celiac plexus alcohol ablation 01/28/2023 6.  Family history of prostate and pancreas cancer 7.  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  Typhlitis in 2014 9.  Colon polyps-tubular adenomas on colonoscopy 03/27/2022 10.  Presentation emergency room 10/01/2022 and 10/02/2022 with nausea/vomiting, diarrhea, and chest pain-esophagitis 11.  Admission 01/07/2023 with a fever and left leg erythema- likely toxicity from gemcitabine 12.  Admission 01/31/2023 with nausea/vomiting   [name] has metastatic pancreas cancer.  He began salvage treatment with gemcitabine/Abraxane in February.  He has completed 3 treatments with gemcitabine/Abraxane.  There is clinical, radiologic, and CA 19-9 evidence of disease progression.  He is now admitted after an episode of nausea and vomiting.  He does not appear to have a bowel obstruction.  I discussed the prognosis and treatment options with Mr. Gantner and his wife.  I recommend hospice care.  He is in agreement.  We discussed CPR/ACLS.  He will be placed on a no CODE BLUE status.  Recommendations: [facility] referral for home hospice care No CODE BLUE Continue fentanyl patch and Dilaudid for pain Limit blood draws     LOS: 2 days   Thornton Papas, MD   02/02/2023, 9:02 AM

## 2023-02-02 NOTE — Progress Notes (Signed)
PROGRESS NOTE  Jared [name] ZOX:[id number] DOB: [birth year]   PCP: Jared Livinghambliss, Marshall L, MD  Patient is from: Home.  Lives with his wife.  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  CT abdomen and pelvis concerning for progression of metastatic pancreatic cancer since February with new liver mets, increased omental and retroperitoneal metastasis, and esophageal wall thickening suggesting esophagitis.   Given progression of cancer despite treatment, palliative medicine consulted.    Subjective: Seen and examined earlier this morning.  No major events overnight of this morning.  Continues to endorse pain and nausea.  No emesis here.  Has been tolerating clear liquid diet.  Initially, he rated his pain 5/10.  After discussion about changing pain medication, he started reporting 8-9 abdominal pain.  Patient's wife and daughter has been encouraging him to slowly back off on pain medication but patient is very anxious about this.  At the end, we have decided to continue his fentanyl patch and start p.o. Dilaudid at 8 mg every 4 hours and using IV Dilaudid as needed for breakthrough pain.  Objective: Vitals:   02/01/23 1329 02/01/23 2134 02/01/23 2206 02/02/23 0456  BP: 117/85 113/81 117/68 125/85  Pulse: 87 78 76 73  Resp:  16 18 16   Temp: 98.4 F (36.9 C) 98.2 F (36.8 C) 98 F (36.7 C) 97.9 F (36.6 C)  TempSrc: Oral Oral Oral Oral  SpO2: 99% 98% 99% 99%  Weight:      Height:        Examination:  GENERAL: No apparent  distress.  Nontoxic. HEENT: MMM.  Vision and hearing grossly intact.  NECK: Supple.  No apparent JVD.  RESP:  No IWOB.  Fair aeration bilaterally. CVS:  RRR. Heart sounds normal.  ABD/GI/GU: BS+. Abd soft, NTND.  MSK/EXT:  Moves extremities. No apparent deformity. No edema.  SKIN: no apparent skin lesion or wound NEURO: Awake, alert and oriented appropriately.  No apparent focal neuro deficit. PSYCH: Calm. Normal affect.    Procedures:  None  Microbiology summarized: None  Assessment and plan: Principal Problem:   Intractable nausea and vomiting Active Problems:   GERD (gastroesophageal reflux disease)   Generalized abdominal pain   Cancer of pancreas, body   Glaucoma   Anemia of chronic disease   Ileus   Hypokalemia   Malnutrition of moderate degree   Cancer related pain   Metastatic pancreatic cancer/cancer related abdominal pain: CT with progression including new liver mets and increased omental and retroperitoneal metastasis.  Patient was on chemo with gemcitabine/Abraxan.  Last treatment the day before admission.  Followed by Dr. Truett PernaSherrill and palliative medicine -Appreciate input by palliative-DNR/DNI, referral for home hospice -Continue fentanyl patch.   -Restart home Dilaudid at 8 mg every 4 hours.   -Change IV Dilaudid every 3 hours as needed severe pain -Palliative medicine consulted -Continue bowel regimen  Intractable nausea and vomiting: Could be due to malignancy and possibly chemo.  No further emesis since admission.  Still with nausea.  Tolerated clear liquid diet. -Advance to full liquid diet -Continue antiemetics -Continue IV fluid  Possible ileus?  Patient reports regular bowel movements. -Ambulate in the hallway -Bowel regimen with opioids  GERD: CT with esophageal wall thickening suggesting esophagitis.  Cannot rule out metastasis -Continue PPI  Anemia of chronic disease: H&H stable.  Hypokalemia -Monitor replenish as  appropriate  Glaucoma -Continue eyedrops  Goal of care: After discussion about CT finding, patient and patient's wife would like to focus on pain control and quality of life.  CODE STATUS changed to DNR/DNI by oncology. -TOC consulted for hospice referral -Palliative medicine consult as well.  Moderate malnutrition Body mass index is 27.21 kg/m. Nutrition Problem: Moderate Malnutrition Etiology: chronic illness, cancer and cancer related treatments Signs/Symptoms: mild fat depletion, mild muscle depletion, percent weight loss Interventions: Boost Breeze   DVT prophylaxis:  enoxaparin (LOVENOX) injection 40 mg Start: 01/31/23 2200  Code Status: DNR/DNI Family Communication: Updated patient's wife and daughter over the phone from bedside Level of care: Telemetry Status is: Inpatient Remains inpatient appropriate because: Intractable nausea and vomiting, cancer related pain   Final disposition: TBD Consultants:  Oncology Palliative medicine  55 minutes with more than 50% spent in reviewing records, counseling patient/family and coordinating care.   Sch Meds:  Scheduled Meds:  brimonidine  1 drop Left Eye BID AC & HS   Carboxymethylcell-Glycerin PF  1 drop Ophthalmic BID   Chlorhexidine Gluconate Cloth  6 each Topical Daily   Dorzolamide HCl-Timolol Mal PF  1 drop Ophthalmic BID   enoxaparin (LOVENOX) injection  40 mg Subcutaneous Q24H   feeding supplement  1 Container Oral TID WC   fentaNYL  1 patch Transdermal Q72H   HYDROmorphone  8 mg Oral Q4H   pantoprazole (PROTONIX) IV  40 mg Intravenous Q24H   predniSONE  10 mg Oral Q breakfast   Continuous Infusions:  0.9 % NaCl with KCl 20 mEq / [name] 100 mL/hr at 02/02/23 0526   potassium PHOSPHATE IVPB (in mmol) 30 mmol (02/02/23 1104)   PRN Meds:.acetaminophen **OR** acetaminophen, hydrocortisone cream, HYDROmorphone (DILAUDID) injection, ondansetron (ZOFRAN) IV, phenol, prochlorperazine  Antimicrobials: Anti-infectives  (From admission, onward)    None        I have personally reviewed the following labs and images: CBC: Recent Labs  Lab 01/28/23 0958 01/30/23 0921 01/31/23 0401 02/01/23 1305 02/02/23 0415  WBC 6.4 7.0 8.9 11.4* 9.8  NEUTROABS  --  5.0 8.1*  --   --   HGB 11.5* 11.4* 12.1* 12.6* 11.3*  HCT 36.3* 34.7* 36.7* 40.4 34.6*  MCV 87.5 85.5 85.2 88.6 86.3  PLT 241 198 190 154 149*   BMP &GFR Recent Labs  Lab 01/30/23 0921 01/31/23 0401 02/01/23 1047 02/01/23 1305 02/02/23 0415  NA 138 137  --  139 139  K 3.8 3.1*  --  4.0 3.8  CL 103 101  --  108 108  CO2 29 25  --  21* 25  GLUCOSE 139* 144*  --  137* 118*  BUN 10 8  --  7* 7*  CREATININE 0.51* 0.48*  --  0.48* 0.39*  CALCIUM 8.8* 8.3*  --  8.0* 8.0*  MG  --  2.0 0.7* 2.1 2.2  PHOS  --  2.6  --  2.4* 1.8*   Estimated Creatinine Clearance: 84.9 mL/min (A) (by C-G formula based on SCr of 0.39 mg/dL ([name])). Liver & Pancreas: Recent Labs  Lab 01/30/23 0921 01/31/23 0401 02/01/23 1305 02/02/23 0415  AST 19 23  --   --   ALT 15 17  --   --  ALKPHOS 79 75  --   --   BILITOT 0.5 1.3*  --   --   PROT 5.5* 5.6*  --   --   ALBUMIN 3.3* 3.0* 3.0* 2.7*   Recent Labs  Lab 01/31/23 0401  LIPASE 22   No results for input(s): "AMMONIA" in the last 168 hours. Diabetic: No results for input(s): "HGBA1C" in the last 72 hours. No results for input(s): "GLUCAP" in the last 168 hours. Cardiac Enzymes: No results for input(s): "CKTOTAL", "CKMB", "CKMBINDEX", "TROPONINI" in the last 168 hours. No results for input(s): "PROBNP" in the last 8760 hours. Coagulation Profile: Recent Labs  Lab 01/28/23 0958  INR 1.1   Thyroid Function Tests: No results for input(s): "TSH", "T4TOTAL", "FREET4", "T3FREE", "THYROIDAB" in the last 72 hours. Lipid Profile: No results for input(s): "CHOL", "HDL", "LDLCALC", "TRIG", "CHOLHDL", "LDLDIRECT" in the last 72 hours. Anemia Panel: No results for input(s): "VITAMINB12", "FOLATE",  "FERRITIN", "TIBC", "IRON", "RETICCTPCT" in the last 72 hours. Urine analysis:    Component Value Date/Time   COLORURINE YELLOW 01/31/2023 0719   APPEARANCEUR CLEAR 01/31/2023 0719   LABSPEC 1.041 (H) 01/31/2023 0719   PHURINE 7.0 01/31/2023 0719   GLUCOSEU NEGATIVE 01/31/2023 0719   HGBUR SMALL (A) 01/31/2023 0719   BILIRUBINUR NEGATIVE 01/31/2023 0719   KETONESUR 80 (A) 01/31/2023 0719   PROTEINUR NEGATIVE 01/31/2023 0719   UROBILINOGEN 1.0 07/13/2013 0342   NITRITE NEGATIVE 01/31/2023 0719   LEUKOCYTESUR NEGATIVE 01/31/2023 0719   Sepsis Labs: Invalid input(s): "PROCALCITONIN", "LACTICIDVEN"  Microbiology: No results found for this or any previous visit (from the past 240 hour(s)).  Radiology Studies: No results found.    Joelle Flessner T. Caswell Alvillar [facility]  If 7PM-7AM, please contact night-coverage [email or web address] 02/02/2023, 11:44 AM

## 2023-02-02 NOTE — Consult Note (Signed)
Consultation Note Date: 02/02/2023   Patient Name: Jared [name]  DOB: 29-Dec-1945  MRN: [id number]  Age / Sex: 77 y.o., male  PCP: Jared Living, MD Referring Physician: Almon Hercules, MD  Reason for Consultation: Establishing goals of care  HPI/Patient Profile: 76 y.o. male   admitted on 01/31/2023   77 yo male with life limiting illness of metastatic pancreas cancer.   Clinical Assessment and Goals of Care:  [name] has metastatic pancreas cancer. He began salvage treatment with gemcitabine/Abraxane in February. He has completed 3 treatments with gemcitabine/Abraxane. There is clinical, radiologic, and CA 19-9 evidence of disease progression. He is now admitted after an episode of nausea and vomiting. He does not appear to have a bowel obstruction.    Patient was seen by medical oncology Dr Jared [name] earlier today and in light of the patient's recent imaging showing progression, the patient's code status was revised to DNR DNI and a TOC consult has been placed for home with hospice arrangements.   Patient is resting in his room, denies complaints, no family at bedside, discussed with his bedside RN as well about his current opioid regimen.   Palliative medicine is specialized medical care for people [name] with serious illness. It focuses on providing relief from the symptoms and stress of a serious illness. The goal is to improve quality of life for both the patient and the family. Goals of care: Broad aims of medical therapy in relation to the patient's values and preferences. Our aim is to provide medical care aimed at enabling patients to achieve the goals that matter most to them, given the circumstances of their particular medical situation and their constraints.    HCPOA Spouse and daughter.    SUMMARY OF RECOMMENDATIONS    DNR Home with Hospice, TOC consulted.  Pain and non pain symptom  management needs noted, continue current opioid regimen and monitor.   Code Status/Advance Care Planning: DNR   Symptom Management:     Palliative Prophylaxis:  Delirium Protocol   Psycho-social/Spiritual:  Desire for further Chaplaincy support:yes Additional Recommendations: Education on Hospice  Prognosis:  < 6 months  Discharge Planning: Home with Hospice      Primary Diagnoses: Present on Admission:  Intractable nausea and vomiting  Cancer of pancreas, body  Anemia of chronic disease  GERD (gastroesophageal reflux disease)  Glaucoma  Generalized abdominal pain  Ileus  Hypokalemia   I have reviewed the medical record, interviewed the patient and family, and examined the patient. The following aspects are pertinent.  Past Medical History:  Diagnosis Date   Aortic atherosclerosis 10/02/2022   Arthritis    SHOULDER & KNEES   Bilateral cataracts 10/02/2022   Bone cancer    Cataract    bilateral sx   Choledocholithiasis 07/23/2022   Chronic kidney disease    pt states he does not have kidney disease, just a hx of kidney stones   GERD 02/22/2009   Qualifier: Diagnosis of   By: Jared Payor MD, Jared [name]  Glaucoma 10/02/2022   History of kidney stones    Hyperlipidemia 09/06/2021   The 10-year ASCVD risk score (Arnett DK, et al., 2019) is: 12.5%    Values used to calculate the score:      Age: [age] years      Sex: Male      Is Non-Hispanic African American: Yes      Diabetic: No      Tobacco smoker: No      Systolic Blood Pressure: 125 mmHg      Is BP treated: No      HDL Cholesterol: 82 mg/dL      Total Cholesterol: 241 mg/dL      Inguinal hernia    right   Intractable nausea and vomiting 01/31/2023   Leukocytosis 10/02/2022   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    Neoplasm of prostate    Pancreatic cancer 2023   Prostate cancer 10/29/1988   Social History   Socioeconomic History   Marital status: Married    Spouse name: Not on file   Number of children:  1   Years of education: Not on file   Highest education level: Not on file  Occupational History   Occupation: retired  Tobacco Use   Smoking status: Former    Packs/day: 0.50    Years: 20.00    Additional pack years: 0.00    Total pack years: 10.00    Types: Cigarettes    Quit date: 10/29/1981    Years since quitting: 41.2   Smokeless tobacco: Never  Vaping Use   Vaping Use: Never used  Substance and Sexual Activity   Alcohol use: No   Drug use: No   Sexual activity: Yes  Other Topics Concern   Not on file  Social History Narrative   Married to Jared [name]. Retired. College [employer] where he met his [place].            Social Determinants of Health   Financial Resource Strain: Low Risk  (07/26/2022)   Overall Financial Resource Strain (CARDIA)    Difficulty of Paying [name] Expenses: Not hard at all  Food Insecurity: No Food Insecurity (01/31/2023)   Hunger Vital Sign    Worried About Running Out of Food in the Last Year: Never true    Ran Out of Food in the Last Year: Never true  Transportation Needs: No Transportation Needs (01/31/2023)   PRAPARE - Administrator, Civil ServiceTransportation    Lack of Transportation (Medical): No    Lack of Transportation (Non-Medical): No  Physical Activity: Not on file  Stress: Not on file  Social Connections: Socially Integrated (07/26/2022)   Social Connection and Isolation Panel [NHANES]    Frequency of Communication with Friends and Family: More than three times a week    Frequency of Social Gatherings with Friends and Family: More than three times a week    Attends Religious Services: More than 4 times per year    Active Member of Clubs or Organizations: Yes    Attends Engineer, structuralClub or Organization Meetings: More than 4 times per year    Marital Status: Married   Family History  Problem Relation Age of Onset   Pancreatic cancer Mother 7280   Kidney cancer Mother 3582   Thyroid cancer Mother 5181   Prostate  cancer Father 2166   Cancer Sister 3166       carcinoid tumor   Prostate cancer Brother 4260   Prostate cancer  Brother 42   Thyroid cancer Brother 11   Prostate cancer Brother 40   Prostate cancer Brother 18   Pancreatic cancer Paternal Aunt 79   Pancreatic cancer Paternal Aunt 72   Prostate cancer Paternal Uncle 61   Prostate cancer Paternal Uncle 34   Cervical cancer Maternal Grandmother 68   Prostate cancer Paternal Grandfather 5   Colon cancer Neg Hx    Stomach cancer Neg Hx    Esophageal cancer Neg Hx    Colon polyps Neg Hx    Rectal cancer Neg Hx    Scheduled Meds:  brimonidine  1 drop Left Eye BID AC & HS   Carboxymethylcell-Glycerin PF  1 drop Ophthalmic BID   Chlorhexidine Gluconate Cloth  6 each Topical Daily   Dorzolamide HCl-Timolol Mal PF  1 drop Ophthalmic BID   enoxaparin (LOVENOX) injection  40 mg Subcutaneous Q24H   feeding supplement  1 Container Oral TID WC   fentaNYL  1 patch Transdermal Q72H   HYDROmorphone  8 mg Oral Q4H   pantoprazole (PROTONIX) IV  40 mg Intravenous Q24H   predniSONE  10 mg Oral Q breakfast   Continuous Infusions:  0.9 % NaCl with KCl 20 mEq / L 100 mL/hr at 02/02/23 0526   potassium PHOSPHATE IVPB (in mmol) 30 mmol (02/02/23 1104)   PRN Meds:.acetaminophen **OR** acetaminophen, hydrocortisone cream, HYDROmorphone (DILAUDID) injection, ondansetron (ZOFRAN) IV, phenol, prochlorperazine Medications Prior to Admission:  Prior to Admission medications   Medication Sig Start Date End Date Taking? Authorizing Provider  acetaminophen (TYLENOL) 325 MG tablet Take 325 mg by mouth daily as needed for headache.   Yes [provider]  brimonidine (ALPHAGAN) 0.15 % ophthalmic solution Place 1 drop into the left eye in the morning and at bedtime.   Yes [provider]  Carboxymethylcell-Glycerin PF (REFRESH OPTIVE PF) 0.5-0.9 % SOLN Place 1 drop into both eyes in the morning and at bedtime. 04/28/18  Yes [provider]   cholecalciferol (VITAMIN D3) 25 MCG (1000 UNIT) tablet Take 1,000 Units by mouth daily.   Yes [provider]  Dorzolamide HCl-Timolol Mal PF 2-0.5 % SOLN Place 1 drop into the left eye 2 (two) times daily. 12/01/21  Yes [provider]  fentaNYL (DURAGESIC) 100 MCG/HR Place 1 patch onto the skin every 3 (three) days. 01/29/23  Yes Pickenpack-Cousar, Arty Baumgartner, NP  HYDROmorphone (DILAUDID) 4 MG tablet Take 2-3 tablets (8-12 mg total) by mouth every 4 (four) hours as needed for severe pain. Patient taking differently: Take 8-12 mg by mouth See admin instructions. Take 8-12 mg by mouth every two to four hours as needed for pain 01/22/23  Yes Ladene Artist, MD  lidocaine-prilocaine (EMLA) cream Apply 1 Application topically as needed. Patient taking differently: Apply 1 Application topically as needed (for port access). 12/11/22  Yes Ladene Artist, MD  ondansetron (ZOFRAN) 8 MG tablet Take 1 tablet (8 mg total) by mouth every 8 (eight) hours as needed for nausea or vomiting (Starting day 3 after chemo as needed for nausea(received Aloxi with chemo)). 09/10/22  Yes Ladene Artist, MD  pantoprazole (PROTONIX) 40 MG tablet Take 1 tablet (40 mg total) by mouth 2 (two) times daily. 01/29/23  Yes Pickenpack-Cousar, Arty Baumgartner, NP  Polyethylene Glycol 3350 (MIRALAX PO) Take 17 g by mouth daily as needed (for constipation- mix and drink).   Yes [provider]  potassium chloride SA (KLOR-CON M) 20 MEQ tablet Take 20 mEq by mouth daily.  Yes [provider]  predniSONE (DELTASONE) 10 MG tablet Take 1 tablet (10 mg total) by mouth daily with breakfast. 01/17/23  Yes Ladene Artist, MD  prochlorperazine (COMPAZINE) 10 MG tablet Take 1 tablet (10 mg total) by mouth every 6 (six) hours as needed for nausea or vomiting. 10/07/22  Yes Rodolph Bong, MD  sodium chloride (AYR) 0.65 % nasal spray Place 2-3 sprays into the nose as needed for congestion.   Yes [provider]  Triptorelin Pamoate 11.25 MG SUSR Inject 11.25 mg into the muscle as needed (PSA).   Yes [provider]  TUMS ULTRA 1000 1000 MG chewable tablet Chew 1,000 mg by mouth See admin instructions. Chew 1,000 mg by mouth with each dose of Vitamin D-3   Yes [provider]  diphenhydrAMINE (BENADRYL) 25 mg capsule Take 1 capsule (25 mg total) by mouth every 6 (six) hours as needed (left leg erythema). 01/11/23   Rhetta Mura, MD  fluconazole (DIFLUCAN) 100 MG tablet Take 1 tablet (100 mg total) by mouth daily. Patient not taking: Reported on 01/31/2023 01/30/23   Ladene Artist, MD  tiZANidine (ZANAFLEX) 4 MG tablet Take 1 tablet (4 mg total) by mouth every 8 (eight) hours as needed for muscle spasms. Patient not taking: Reported on 01/17/2023 11/30/22   Almond Lint, MD   Allergies  Allergen Reactions   Pseudoephedrine Other (See Comments)    Increased ocular pressure   Lupron [Leuprolide] Itching and Other (See Comments)    Caused infection on hip    Review of Systems Episodic nausea and abd pain.   Physical Exam No distress Regular breathing Regular Abdomen non tender No LE edema     Vital Signs: BP 125/85   Pulse 73   Temp 97.9 F (36.6 C) (Oral)   Resp 16   Ht 6' (1.829 m)   Wt 91 kg   SpO2 99%   BMI 27.21 kg/m  Pain Scale: 0-10   Pain Score: 4    SpO2: SpO2: 99 % O2 Device:SpO2: 99 % O2 Flow Rate: .   IO: Intake/output summary:  Intake/Output Summary (Last 24 hours) at 02/02/2023 1304 Last data filed at 02/02/2023 1244 Gross per 24 hour  Intake 2867.77 ml  Output 1100 ml  Net 1767.77 ml    LBM: Last BM Date : 01/31/23 Baseline Weight: Weight: 91 kg Most recent weight: Weight: 91 kg     Palliative Assessment/Data:   PPS 60%  Time In:  12 Time Out:  1300 Time Total:  60 min.  Greater than 50%  of this time was spent counseling and coordinating care related to the above assessment and plan.  Signed by: Rosalin Hawking, MD   Please  contact Palliative Medicine Team phone at [phone] for questions and concerns.  For individual provider: See Loretha Stapler

## 2023-02-02 NOTE — Plan of Care (Signed)
  Problem: Education: Goal: Knowledge of General Education information will improve Description: Including pain rating scale, medication(s)/side effects and non-pharmacologic comfort measures Outcome: Progressing   Problem: Health Behavior/Discharge Planning: Goal: Ability to manage health-related needs will improve Outcome: Progressing   Problem: Clinical Measurements: Goal: Ability to maintain clinical measurements within normal limits will improve Outcome: Progressing Goal: Will remain free from infection Outcome: Progressing Goal: Diagnostic test results will improve Outcome: Progressing Goal: Respiratory complications will improve Outcome: Progressing Goal: Cardiovascular complication will be avoided Outcome: Progressing   Problem: Activity: Goal: Risk for activity intolerance will decrease Outcome: Progressing   Problem: Nutrition: Goal: Adequate nutrition will be maintained Outcome: Progressing   Problem: Coping: Goal: Level of anxiety will decrease Outcome: Progressing   Problem: Pain Managment: Goal: General experience of comfort will improve Outcome: Progressing   

## 2023-02-02 NOTE — Plan of Care (Signed)

## 2023-02-03 DIAGNOSIS — R112 Nausea with vomiting, unspecified: Secondary | ICD-10-CM | POA: Diagnosis not present

## 2023-02-03 DIAGNOSIS — C251 Malignant neoplasm of body of pancreas: Secondary | ICD-10-CM | POA: Diagnosis not present

## 2023-02-03 DIAGNOSIS — M79605 Pain in left leg: Secondary | ICD-10-CM

## 2023-02-03 DIAGNOSIS — Z66 Do not resuscitate: Secondary | ICD-10-CM

## 2023-02-03 DIAGNOSIS — K219 Gastro-esophageal reflux disease without esophagitis: Secondary | ICD-10-CM

## 2023-02-03 DIAGNOSIS — E440 Moderate protein-calorie malnutrition: Secondary | ICD-10-CM | POA: Diagnosis not present

## 2023-02-03 DIAGNOSIS — M79604 Pain in right leg: Secondary | ICD-10-CM

## 2023-02-03 DIAGNOSIS — R1084 Generalized abdominal pain: Secondary | ICD-10-CM | POA: Diagnosis not present

## 2023-02-03 MED ORDER — GABAPENTIN 300 MG PO CAPS
300.0000 mg | ORAL_CAPSULE | Freq: Two times a day (BID) | ORAL | Status: DC
  Administered 2023-02-03 – 2023-02-04 (×3): 300 mg via ORAL
  Filled 2023-02-03 (×3): qty 1

## 2023-02-03 MED ORDER — HYDROMORPHONE HCL 1 MG/ML IJ SOLN: 0.5000 mg | INTRAMUSCULAR | Status: DC | PRN

## 2023-02-03 NOTE — TOC Initial Note (Signed)
Transition of Care [facility]) - Initial/Assessment Note    Patient Details  Name: Jared [name] MRN: [id number] Date of Birth: [birth year]  [facility] CM/SW Contact:    Georgie Chard, LCSW Phone Number: 02/03/2023, 12:20 PM  Clinical Narrative:                 CSW spoke to the wife and patient, at this time they are considering Home Hospice. This CSW has made the referral to Thea Gist with [facility]. TOC will continue to follow for any DC needs.   Expected Discharge Plan: Home w Hospice Care Barriers to Discharge: Continued Medical Work up   Patient Goals and CMS Choice Patient states their goals for this hospitalization and ongoing recovery are:: Patient's wife would like to do Home Hospice          Expected Discharge Plan and Services       Living arrangements for the past 2 months: Single Family Home                                      Prior Living Arrangements/Services Living arrangements for the past 2 months: Single Family Home Lives with:: Spouse Patient language and need for interpreter reviewed:: No Do you feel safe going back to the place where you live?: Yes      Need for Family Participation in Patient Care: No (Comment) Care giver support system in place?: No (comment)   Criminal Activity/Legal Involvement Pertinent to Current Situation/Hospitalization: No - Comment as needed  Activities of Daily Living Home Assistive Devices/Equipment: Eyeglasses ADL Screening (condition at time of admission) Patient's cognitive ability adequate to safely complete daily activities?: Yes Is the patient deaf or have difficulty hearing?: No Does the patient have difficulty seeing, even when wearing glasses/contacts?: No Does the patient have difficulty concentrating, remembering, or making decisions?: No Patient able to express need for assistance with ADLs?: Yes Does the patient have difficulty dressing or bathing?: Yes Independently performs  ADLs?: Yes (appropriate for developmental age) Does the patient have difficulty walking or climbing stairs?: Yes Weakness of Legs: Both Weakness of Arms/Hands: Both  Permission Sought/Granted                  Emotional Assessment       Orientation: : Oriented to Self, Oriented to Place, Oriented to  Time Alcohol / Substance Use: Other (comment) Psych Involvement: No (comment)  Admission diagnosis:  Intractable nausea and vomiting [R11.2] Malignant neoplasm of pancreas, unspecified location of malignancy [C25.9] Patient Active Problem List   Diagnosis Date Noted   DNR (do not resuscitate) 02/02/2023   Malnutrition of moderate degree 02/01/2023   Cancer related pain 02/01/2023   Intractable nausea and vomiting 01/31/2023   Ileus 01/31/2023   Hypokalemia 01/31/2023   Anemia of chronic disease 01/08/2023   Cellulitis of left lower extremity 01/07/2023   Bilateral lower extremity edema 12/24/2022   Severe protein-calorie malnutrition 12/24/2022   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   Family history of pancreatic cancer 10/17/2022   Family history of prostate cancer 10/17/2022   Esophagitis 10/02/2022   Leukocytosis 10/02/2022   Hyperglycemia 10/02/2022   Aortic atherosclerosis 10/02/2022   Bilateral cataracts 10/02/2022   Glaucoma 10/02/2022   Genetic testing 09/06/2022   Choledocholithiasis 07/23/2022   Elevated CA 19-9 level 07/23/2022   H/O prostate cancer 07/23/2022   Cancer of pancreas,  body 07/17/2022   Generalized abdominal pain 03/20/2022   Hyperlipidemia 09/06/2021   Abnormal white blood cell (WBC) count 09/16/2019   Hx of inguinal hernia surgery 03/12/2015   Kidney stone 06/24/2013   DJD (degenerative joint disease) 02/21/2011   OSTEOPENIA 03/24/2009   GERD (gastroesophageal reflux disease) 02/22/2009   PROSTATE CANCER 12/26/2006   PCP:  Carney Living, MD Pharmacy:   [facility] [address] [place] - [address] [place] [address] Phone: [phone] Fax: [phone]     Social Determinants of Health (SDOH) Social History: SDOH Screenings   Food Insecurity: No Food Insecurity (01/31/2023)  Housing: Low Risk  (01/31/2023)  Transportation Needs: No Transportation Needs (01/31/2023)  Utilities: Not At Risk (01/31/2023)  Depression (PHQ2-9): Low Risk  (10/16/2022)  Financial Resource Strain: Low Risk  (07/26/2022)  Social Connections: Socially Integrated (07/26/2022)  Tobacco Use: Medium Risk (01/31/2023)   SDOH Interventions:     Readmission Risk Interventions    02/03/2023   12:18 PM 02/01/2023    2:28 PM 01/10/2023   10:22 AM  Readmission Risk Prevention Plan  Transportation Screening Complete Complete Complete  Medication Review Oceanographer) Not Complete Complete Complete  Med Review Comments RNCM will review    PCP or Specialist appointment within 3-5 days of discharge Not Complete Complete Complete  HRI or Home Care Consult Complete Complete Complete  SW Recovery Care/Counseling Consult Complete Complete Complete  Palliative Care Screening Complete Not Applicable Not Applicable  Skilled Nursing Facility Not Applicable Not Applicable Not Applicable

## 2023-02-03 NOTE — Progress Notes (Signed)
[facility]) Hospital Liaison RN note  Received request from TOC/PMT for hospice services discussion with patient and family. Spoke with patient/wife and daughter to initiate education related to hospice philosophy, services, and team approach to care. Patient/family verbalized understanding of information given.Patient and family would like to proceed with hospice referral.   Per discussion, the plan is for discharge likely tomorrow or Tuesday DME needs discussed. There are no immediate DME needs.   Please send signed and completed DNR with patient/family. Please provide prescriptions at discharge as needed for ongoing symptom management.   Please call with any hospice related questions or concerns.  Thank you for the opportunity to participate in this patient's care.  Thea Gist, Charity fundraiser, BSN [employer] [phone]

## 2023-02-03 NOTE — Progress Notes (Signed)
PROGRESS NOTE  Jared [name] VBT:[id number] DOB: 01-07-1946   PCP: Carney Living, MD  Patient is from: Home.  Lives with his wife.  DOA: 01/31/2023 LOS: 3  Chief complaints Chief Complaint  Patient presents with   Abdominal Pain   Emesis     Brief Narrative / Interim history: 77 year old M with PMH of metastatic pancreatic cancer on chemo, chronic abdominal pain s/p celiac plexus block 2 days prior to presentation, choledocholithiasis, urolithiasis, prostate cancer, osteoarthritis, GERD and HLD presented to ED with abdominal pain, multiple episodes of nausea and emesis, and admitted for intractable nausea, vomiting and abdominal pain.  CT abdomen and pelvis concerning for progression of metastatic pancreatic cancer since February with new liver mets, increased omental and retroperitoneal metastasis, and esophageal wall thickening suggesting esophagitis.   Given progression of cancer despite treatment, CODE STATUS changed to DNR/DNI.  Oncology recommended discharge home with home hospice.  TOC consulted for hospice referral.  Palliative consulted and following as well.    Subjective: Seen and examined earlier this morning.  No major events overnight of this morning.  Reports improvement in his pain.  He currently rates his pain 2/10.  However, he complains itching and burning in his legs.  Noticed some redness in his legs yesterday.  The redness seems to have improved today.  He was started on Benadryl yesterday.  Objective: Vitals:   02/02/23 0456 02/02/23 1328 02/02/23 2042 02/03/23 0530  BP: 125/85 116/83 105/66 119/84  Pulse: 73 84 87 75  Resp: 16 18 18 18   Temp: 97.9 F (36.6 C) 98.6 F (37 C) 98.5 F (36.9 C) 98.3 F (36.8 C)  TempSrc: Oral Oral Oral Oral  SpO2: 99% 99% 96% 98%  Weight:      Height:        Examination:  GENERAL: Sitting on bedside chair.  Nontoxic. HEENT: MMM.  Vision and hearing grossly intact.  NECK: Supple.  No apparent JVD.  RESP:  No  IWOB.  Fair aeration bilaterally. CVS:  RRR. Heart sounds normal.  ABD/GI/GU: BS+. Abd soft, NTND.  MSK/EXT:   No apparent deformity. Moves extremities. No edema.  SKIN: no apparent skin lesion or wound NEURO: Awake and alert. Oriented appropriately.  No apparent focal neuro deficit. PSYCH: Calm. Normal affect.   Procedures:  None  Microbiology summarized: None  Assessment and plan: Principal Problem:   Intractable nausea and vomiting Active Problems:   GERD (gastroesophageal reflux disease)   Generalized abdominal pain   Cancer of pancreas, body   Glaucoma   Anemia of chronic disease   Ileus   Hypokalemia   Malnutrition of moderate degree   Cancer related pain   DNR (do not resuscitate)   Bilateral lower extremity pain   Metastatic pancreatic cancer/cancer related abdominal pain: CT with progression including new liver mets and increased omental and retroperitoneal metastasis.  Patient was on chemo with gemcitabine/Abraxan.  Last treatment the day before admission.  Followed by Dr. Truett Perna and palliative medicine -Appreciate input by palliative-DNR/DNI, referral for home hospice -Continue fentanyl patch.   -Continue home p.o. Dilaudid 8 mg every 4 hours -Decrease IV Dilaudid to 0.5 mg every 3 hours for severe pain.  Has not required IV Dilaudid since yesterday. -Appreciate help by palliative medicine oncology -Continue bowel regimen -TOC consulted for home hospice.  Intractable nausea and vomiting: Could be due to malignancy and possibly chemo.  No further emesis since admission.  Still with nausea.  Tolerated full code diet. -Advance to soft diet. -  Continue antiemetics -Discontinue IV fluids.  Possible ileus?  Patient reports regular bowel movements. -Bowel regimen with opioids  Bilateral lower extremity pain and itching: Patient is not a great historian.  Describes his pain as burning which makes me think of chemo related neuropathy.  Unsure about the itching.  He  also reported redness that seems to have improved. -Continue p.o. Benadryl -Add gabapentin 300 mg twice daily for possible neuropathic pain  GERD: CT with esophageal wall thickening suggesting esophagitis.  Cannot rule out metastasis -Continue PPI  Anemia of chronic disease: H&H stable.  Hypokalemia: Resolved.  Glaucoma -Continue eyedrops  Goal of care: DNR/DNI.  Plan for discharge home with home hospice.  TOC consulted for hospice referral  Moderate malnutrition Body mass index is 27.21 kg/m. Nutrition Problem: Moderate Malnutrition Etiology: chronic illness, cancer and cancer related treatments Signs/Symptoms: mild fat depletion, mild muscle depletion, percent weight loss Interventions: Boost Breeze   DVT prophylaxis:  enoxaparin (LOVENOX) injection 40 mg Start: 01/31/23 2200  Code Status: DNR/DNI Family Communication: Updated patient's wife and daughter over the phone from bedside Level of care: Med-Surg Status is: Inpatient Remains inpatient appropriate because: Intractable nausea and vomiting, cancer related pain   Final disposition: Home with home hospice Consultants:  Oncology Palliative medicine  35 minutes with more than 50% spent in reviewing records, counseling patient/family and coordinating care.   Sch Meds:  Scheduled Meds:  brimonidine  1 drop Left Eye BID AC & HS   Carboxymethylcell-Glycerin PF  1 drop Ophthalmic BID   Chlorhexidine Gluconate Cloth  6 each Topical Daily   Dorzolamide HCl-Timolol Mal PF  1 drop Ophthalmic BID   enoxaparin (LOVENOX) injection  40 mg Subcutaneous Q24H   feeding supplement  1 Container Oral TID WC   fentaNYL  1 patch Transdermal Q72H   gabapentin  300 mg Oral BID   HYDROmorphone  8 mg Oral Q4H   pantoprazole (PROTONIX) IV  40 mg Intravenous Q24H   predniSONE  10 mg Oral Q breakfast   Continuous Infusions:   PRN Meds:.acetaminophen **OR** acetaminophen, diphenhydrAMINE, hydrocortisone cream, HYDROmorphone  (DILAUDID) injection, ondansetron (ZOFRAN) IV, phenol, prochlorperazine  Antimicrobials: Anti-infectives (From admission, onward)    None        I have personally reviewed the following labs and images: CBC: Recent Labs  Lab 01/28/23 0958 01/30/23 0921 01/31/23 0401 02/01/23 1305 02/02/23 0415  WBC 6.4 7.0 8.9 11.4* 9.8  NEUTROABS  --  5.0 8.1*  --   --   HGB 11.5* 11.4* 12.1* 12.6* 11.3*  HCT 36.3* 34.7* 36.7* 40.4 34.6*  MCV 87.5 85.5 85.2 88.6 86.3  PLT 241 198 190 154 149*   BMP &GFR Recent Labs  Lab 01/30/23 0921 01/31/23 0401 02/01/23 1047 02/01/23 1305 02/02/23 0415  NA 138 137  --  139 139  K 3.8 3.1*  --  4.0 3.8  CL 103 101  --  108 108  CO2 29 25  --  21* 25  GLUCOSE 139* 144*  --  137* 118*  BUN 10 8  --  7* 7*  CREATININE 0.51* 0.48*  --  0.48* 0.39*  CALCIUM 8.8* 8.3*  --  8.0* 8.0*  MG  --  2.0 0.7* 2.1 2.2  PHOS  --  2.6  --  2.4* 1.8*   Estimated Creatinine Clearance: 84.9 mL/min (A) (by C-G formula based on SCr of 0.39 mg/dL (L)). Liver & Pancreas: Recent Labs  Lab 01/30/23 0921 01/31/23 0401 02/01/23 1305 02/02/23 0415  AST  19 23  --   --   ALT 15 17  --   --   ALKPHOS 79 75  --   --   BILITOT 0.5 1.3*  --   --   PROT 5.5* 5.6*  --   --   ALBUMIN 3.3* 3.0* 3.0* 2.7*   Recent Labs  Lab 01/31/23 0401  LIPASE 22   No results for input(s): "AMMONIA" in the last 168 hours. Diabetic: No results for input(s): "HGBA1C" in the last 72 hours. No results for input(s): "GLUCAP" in the last 168 hours. Cardiac Enzymes: No results for input(s): "CKTOTAL", "CKMB", "CKMBINDEX", "TROPONINI" in the last 168 hours. No results for input(s): "PROBNP" in the last 8760 hours. Coagulation Profile: Recent Labs  Lab 01/28/23 0958  INR 1.1   Thyroid Function Tests: No results for input(s): "TSH", "T4TOTAL", "FREET4", "T3FREE", "THYROIDAB" in the last 72 hours. Lipid Profile: No results for input(s): "CHOL", "HDL", "LDLCALC", "TRIG", "CHOLHDL",  "LDLDIRECT" in the last 72 hours. Anemia Panel: No results for input(s): "VITAMINB12", "FOLATE", "FERRITIN", "TIBC", "IRON", "RETICCTPCT" in the last 72 hours. Urine analysis:    Component Value Date/Time   COLORURINE YELLOW 01/31/2023 0719   APPEARANCEUR CLEAR 01/31/2023 0719   LABSPEC 1.041 (H) 01/31/2023 0719   PHURINE 7.0 01/31/2023 0719   GLUCOSEU NEGATIVE 01/31/2023 0719   HGBUR SMALL (A) 01/31/2023 0719   BILIRUBINUR NEGATIVE 01/31/2023 0719   KETONESUR 80 (A) 01/31/2023 0719   PROTEINUR NEGATIVE 01/31/2023 0719   UROBILINOGEN 1.0 07/13/2013 0342   NITRITE NEGATIVE 01/31/2023 0719   LEUKOCYTESUR NEGATIVE 01/31/2023 0719   Sepsis Labs: Invalid input(s): "PROCALCITONIN", "LACTICIDVEN"  Microbiology: No results found for this or any previous visit (from the past 240 hour(s)).  Radiology Studies: No results found.    Charnel Giles T. Hildegard Hlavac [facility]  If 7PM-7AM, please contact night-coverage [email or web address] 02/03/2023, 12:38 PM

## 2023-02-03 NOTE — Progress Notes (Addendum)
IP PROGRESS NOTE  Subjective:   Jared [name] developed erythema, burning, and pruritus of the left greater than right lower leg yesterday.  He reports Benadryl does help.  Abdominal pain is improved with Dilaudid.  He had an episode of nausea and vomiting yesterday.  Objective: Vital signs in last 24 hours: Blood pressure 119/84, pulse 75, temperature 98.3 F (36.8 C), temperature source Oral, resp. rate 18, height 6' (1.829 m), weight 200 lb 9.6 oz (91 kg), SpO2 98 %.  Intake/Output from previous day: 04/06 0701 - 04/07 0700 In: 2250.2 [P.O.:480; I.V.:1770.2] Out: 900 [Urine:900]  Physical Exam:  HEENT: No thrush or ulcers  Abdomen: No hepatosplenomegaly, no mass, no apparent ascites, soft, nontender Extremities: No leg edema Skin: Erythema extending from the left lower leg to the medial upper thigh.  Less intense erythema in a similar distribution of the right leg  Portacath/PICC-without erythema  Lab Results: Recent Labs    02/01/23 1305 02/02/23 0415  WBC 11.4* 9.8  HGB 12.6* 11.3*  HCT 40.4 34.6*  PLT 154 149*    BMET Recent Labs    02/01/23 1305 02/02/23 0415  NA 139 139  K 4.0 3.8  CL 108 108  CO2 21* 25  GLUCOSE 137* 118*  BUN 7* 7*  CREATININE 0.48* 0.39*  CALCIUM 8.0* 8.0*    Lab Results  Component Value Date   BRA309 83,764 (H) 01/30/2023   Medications: I have reviewed the patient's current medications.  Assessment/Plan: Pancreas cancer FNA biopsy of a pancreas body/tail mass 07/05/2022-adenocarcinoma CT abdomen/pelvis 06/14/2022-hypoenhancing pancreas body/tail mass with effacement of the splenic vein, no evidence of lymphadenopathy or metastatic disease CA 19-9 on 06/18/2022-767 EUS 07/05/2022-a 7 x 29 mm pancreas body/tail mass, abutment of the splenic artery, no malignant appearing lymph nodes, T2N0 by EUS CTs 07/15/2022-pancreas body/tail mass with no evidence of metastatic disease, splenic vein associated with the tumor, no arterial involvement,  stable right greater than left lung nodules favored benign Cycle 1 FOLFOX 08/01/2022 Cycle 2 FOLFOX, 08/16/2022, neulasta Cycle 3 FOLFIRINOX 08/29/2022, Neulasta Cycle 4 FOLFIRINOX 09/12/2022, Neulasta, irinotecan and 5-fluorouracil dose reduced due to diarrhea/weight loss Cycle 5 FOLFIRINOX 09/26/2022 CT abdomen/pelvis 10/09/2022-no significant change in the appearance of the ill-defined pancreatic tail mass.  Persistent extrinsic mass effect on the splenic vein which remains patent.  Indeterminate left adrenal nodule. 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  Foundation 1-microsatellite stable, tumor mutation burden 1, K-ras G12R CTs 12/12/2022-increasing size of pancreatic mass with suspected metastatic disease involving the lesser sac and adjacent stomach.  New area of masslike nodularity anterior to the pancreatic neck appears to tether the gastric antrum.  Worsening of peritoneal disease along the inferior surface of the right hepatic margin.  Lesion along the margin of the sigmoid colon is favored to represent a tumor implant.  Enlarging left adrenal gland in close proximity to the pancreatic lesion.  Scattered more conspicuous areas of peritoneal and omental involvement.  Vascular involvement seen on previous imaging most notably of the splenic vein and splenic artery. Cycle 1 gemcitabine/Abraxane 12/13/2022 Chemotherapy held 12/27/2022 due to mild neutropenia Cycle 2 gemcitabine/Abraxane 01/03/2023 Cycle 3 gemcitabine/Abraxane 01/30/2023 CT abdomen/pelvis 01/31/2023-progression of carcinomatosis and primary pancreas mass, new liver metastases Prostate cancer, status post prostatectomy 1990 Chronic elevation of the PSA-maintained on Trelstar, followed by Dr. Annabell Howells   3.  Kidney stones 4.   Common bile duct stones on EUS 07/05/2022 ERCP with stone  and sludge removal  08/23/2022 5.  Abdominal pain, secondary to #1 Celiac plexus block 11/29/2022 Celiac plexus alcohol ablation 01/28/2023 6.  Family history of prostate and pancreas cancer 7.  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  Typhlitis in 2014 9.  Colon polyps-tubular adenomas on colonoscopy 03/27/2022 10.  Presentation emergency room 10/01/2022 and 10/02/2022 with nausea/vomiting, diarrhea, and chest pain-esophagitis 11.  Admission 01/07/2023 with a fever and left leg erythema- likely toxicity from gemcitabine 12.  Admission 01/31/2023 with nausea/vomiting 13.  Erythematous pruritic rash at the left greater than right lower leg-very likely related to toxicity from gemcitabine   Jared [name] has metastatic pancreas cancer.  He was admitted with nausea/vomiting and abdominal pain.  The symptoms have improved.  He has decided to enroll in home hospice care.  Jared [name] has developed edema with associated pruritus and left greater than right leg.  The erythema is likely related to toxicity from gemcitabine. He will continue Benadryl and topical hydrocortisone as needed    Recommendations: [facility] referral for home hospice care No CODE BLUE Continue fentanyl patch and Dilaudid for pain Limit blood draws Benadryl/hydrocortisone for the rash     LOS: 3 days   Thornton PapasGary Macgregor Aeschliman, MD   02/03/2023, 7:58 AM

## 2023-02-03 NOTE — Progress Notes (Signed)
Daily Progress Note   Patient Name: Jared [name]       Date: 02/03/2023 DOB: 20-Aug-1946  Age: 77 y.o. MRN#: [id number] Attending Physician: Almon Hercules, MD Primary Care Physician: Carney Living, MD Admit Date: 01/31/2023  Reason for Consultation/Follow-up: Establishing goals of care  Subjective: Resting in chair, states that leg erythema and edema is bothersome but that the Tylenol and Benadryl help. Discussed about pain medications too. Wife and family at bedside as well.   Length of Stay: 3  Current Medications: Scheduled Meds:   brimonidine  1 drop Left Eye BID AC & HS   Carboxymethylcell-Glycerin PF  1 drop Ophthalmic BID   Chlorhexidine Gluconate Cloth  6 each Topical Daily   Dorzolamide HCl-Timolol Mal PF  1 drop Ophthalmic BID   enoxaparin (LOVENOX) injection  40 mg Subcutaneous Q24H   feeding supplement  1 Container Oral TID WC   fentaNYL  1 patch Transdermal Q72H   gabapentin  300 mg Oral BID   HYDROmorphone  8 mg Oral Q4H   pantoprazole (PROTONIX) IV  40 mg Intravenous Q24H   predniSONE  10 mg Oral Q breakfast    Continuous Infusions:   PRN Meds: acetaminophen **OR** acetaminophen, diphenhydrAMINE, hydrocortisone cream, HYDROmorphone (DILAUDID) injection, ondansetron (ZOFRAN) IV, phenol, prochlorperazine  Physical Exam         Awake alert Sitting in chair No distress Has erythema and edema LLE up to thigh, also with redness RLE as well.   Vital Signs: BP 119/84 (BP Location: Right Arm)   Pulse 75   Temp 98.3 F (36.8 C) (Oral)   Resp 18   Ht 6' (1.829 m)   Wt 91 kg   SpO2 98%   BMI 27.21 kg/m  SpO2: SpO2: 98 % O2 Device: O2 Device: Room Air O2 Flow Rate:    Intake/output summary:  Intake/Output Summary (Last 24 hours) at 02/03/2023 1224 Last  data filed at 02/03/2023 0900 Gross per 24 hour  Intake 2370.2 ml  Output 500 ml  Net 1870.2 ml   LBM: Last BM Date : 02/02/23 Baseline Weight: Weight: 91 kg Most recent weight: Weight: 91 kg       Palliative Assessment/Data:      Patient Active Problem List   Diagnosis Date Noted   DNR (do not resuscitate) 02/02/2023  Malnutrition of moderate degree 02/01/2023   Cancer related pain 02/01/2023   Intractable nausea and vomiting 01/31/2023   Ileus 01/31/2023   Hypokalemia 01/31/2023   Anemia of chronic disease 01/08/2023   Cellulitis of left lower extremity 01/07/2023   Bilateral lower extremity edema 12/24/2022   Severe protein-calorie malnutrition 12/24/2022   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   Family history of pancreatic cancer 10/17/2022   Family history of prostate cancer 10/17/2022   Esophagitis 10/02/2022   Leukocytosis 10/02/2022   Hyperglycemia 10/02/2022   Aortic atherosclerosis 10/02/2022   Bilateral cataracts 10/02/2022   Glaucoma 10/02/2022   Genetic testing 09/06/2022   Choledocholithiasis 07/23/2022   Elevated CA 19-9 level 07/23/2022   H/O prostate cancer 07/23/2022   Cancer of pancreas, body 07/17/2022   Generalized abdominal pain 03/20/2022   Hyperlipidemia 09/06/2021   Abnormal white blood cell (WBC) count 09/16/2019   Hx of inguinal hernia surgery 03/12/2015   Kidney stone 06/24/2013   DJD (degenerative joint disease) 02/21/2011   OSTEOPENIA 03/24/2009   GERD (gastroesophageal reflux disease) 02/22/2009   PROSTATE CANCER 12/26/2006    Palliative Care Assessment & Plan   Patient Profile:    Assessment:  Jared [name] has metastatic pancreas cancer. He began salvage treatment with gemcitabine/Abraxane in February. He has completed 3 treatments with gemcitabine/Abraxane. There is clinical, radiologic, and CA 19-9 evidence of disease progression. He is now admitted after an episode of nausea and vomiting. He does not appear to  have a bowel obstruction.     Patient was seen by medical oncology Dr Truett Perna earlier today and in light of the patient's recent imaging showing progression, the patient's code status was revised to DNR DNI and a TOC consult has been placed for home with hospice arrangements.   Recommendations/Plan:  Home with hospice Continue current opioids and pain management options.    Code Status:    Code Status Orders  (From admission, onward)           Start     Ordered   02/02/23 0939  Do not attempt resuscitation (DNR)  Continuous       Question Answer Comment  If patient has no pulse and is not breathing Do Not Attempt Resuscitation   If patient has a pulse and/or is breathing: Medical Treatment Goals COMFORT MEASURES: Keep clean/warm/dry, use medication by any route; positioning, wound care and other measures to relieve pain/suffering; use oxygen, suction/manual treatment of airway obstruction for comfort; do not transfer unless for comfort needs.   Consent: Discussion documented in EHR or advanced directives reviewed      02/02/23 0940           Code Status History     Date Active Date Inactive Code Status Order ID Comments User Context   01/31/2023 0956 02/02/2023 0940 Full Code [id number]  Bobette Mo, MD ED   01/28/2023 1238 01/29/2023 0512 Full Code [id number]  Suttle, Thressa Sheller, MD HOV   01/07/2023 2355 01/14/2023 0008 Full Code [id number]  Angie Fava, DO Inpatient   11/27/2022 1403 12/03/2022 2151 Full Code [id number]  Almond Lint, MD Inpatient   10/02/2022 0837 10/08/2022 2228 Full Code [id number]  Bobette Mo, MD ED   07/13/2013 1033 07/16/2013 2017 Full Code [id number]  Manus Rudd, MD Inpatient      Advance Directive Documentation    Flowsheet Row Most Recent Value  Type of Advance Directive Healthcare Power of Attorney, Living will  Pre-existing out of facility DNR order (yellow  form or pink MOST form) --  "MOST" Form in Place? --       Prognosis:  <  6 months  Discharge Planning: Home with Hospice  Care plan was discussed with patient wife and family.   Thank you for allowing the Palliative Medicine Team to assist in the care of this patient.  Mod MDM    Greater than 50%  of this time was spent counseling and coordinating care related to the above assessment and plan.  Rosalin HawkingZeba Jared Paulsen, MD  Please contact Palliative Medicine Team phone at [phone] for questions and concerns.

## 2023-02-03 NOTE — TOC Progression Note (Addendum)
Transition of Care [facility]) - Progression Note    Patient Details  Name: Jared [name] MRN: [id number] Date of Birth: [birth year]  Transition of Care [facility]) CM/SW Contact  Georgie Chard, [place] Phone Number: 02/03/2023, 10:34 AM  Clinical Narrative:    Addend: 12:16 CSW spoke to the patient's wife, the wife would like TOC to send referral to [facility]. This CSW has reached out to [employer] for [facility]. At this time [facility] will continue to follow for DC needs.   CSW attempted to call patient wife N/A left HIPAA V/M provider has requested Hospice for patient. TOC will continue to follow.      Barriers to Discharge: Continued Medical Work up  Expected Discharge Plan and Services                                               Social Determinants of Health (SDOH) Interventions SDOH Screenings   Food Insecurity: No Food Insecurity (01/31/2023)  Housing: Low Risk  (01/31/2023)  Transportation Needs: No Transportation Needs (01/31/2023)  Utilities: Not At Risk (01/31/2023)  Depression (PHQ2-9): Low Risk  (10/16/2022)  Financial Resource Strain: Low Risk  (07/26/2022)  Social Connections: Socially Integrated (07/26/2022)  Tobacco Use: Medium Risk (01/31/2023)    Readmission Risk Interventions    02/01/2023    2:28 PM 01/10/2023   10:22 AM  Readmission Risk Prevention Plan  Transportation Screening Complete Complete  Medication Review (RN Care Manager) Complete Complete  PCP or Specialist appointment within 3-5 days of discharge Complete Complete  HRI or Home Care Consult Complete Complete  SW Recovery Care/Counseling Consult Complete Complete  Palliative Care Screening Not Applicable Not Applicable  Skilled Nursing Facility Not Applicable Not Applicable

## 2023-02-04 DIAGNOSIS — E440 Moderate protein-calorie malnutrition: Secondary | ICD-10-CM | POA: Diagnosis not present

## 2023-02-04 DIAGNOSIS — R112 Nausea with vomiting, unspecified: Secondary | ICD-10-CM | POA: Diagnosis not present

## 2023-02-04 DIAGNOSIS — H409 Unspecified glaucoma: Secondary | ICD-10-CM | POA: Diagnosis not present

## 2023-02-04 DIAGNOSIS — E876 Hypokalemia: Secondary | ICD-10-CM | POA: Diagnosis not present

## 2023-02-04 MED ORDER — HYDROMORPHONE HCL 4 MG PO TABS
4.0000 mg | ORAL_TABLET | ORAL | 0 refills | Status: AC | PRN
Start: 2023-02-04 — End: 2023-02-11

## 2023-02-04 MED ORDER — GABAPENTIN 300 MG PO CAPS: 300.0000 mg | ORAL_CAPSULE | Freq: Two times a day (BID) | ORAL | 2 refills | Status: DC

## 2023-02-04 MED ORDER — HEPARIN SOD (PORK) LOCK FLUSH 100 UNIT/ML IV SOLN
500.0000 [IU] | Freq: Once | INTRAVENOUS | Status: AC
  Administered 2023-02-04: 500 [IU] via INTRAVENOUS
  Filled 2023-02-04: qty 5

## 2023-02-04 NOTE — Progress Notes (Addendum)
Patient discharge order placed and paperwork/AVS completed as soon as order was in. Patient explained to RN that he is unable to understand discharge paperwork and prefers for his wife to come in for questions to be answered. Discharge paperwork printed and awaiting wife to discharge patient.  0945-Patient fentanyl patch and meds admin. Awaiting wife to arrive for d/c instructions. 1040 am-Patient wife and daughter provided with discharge instructions and to take patient home. No further needs.

## 2023-02-04 NOTE — Discharge Summary (Signed)
Physician Discharge Summary  Jared [name]:[id number] DOB: Jan 10, 1946 DOA: 01/31/2023  PCP: Carney Living, MD  Admit date: 01/31/2023 Discharge date: 02/04/2023 Admitted From: Home Disposition: Home with home hospice   Discharge Condition: Stable but poor prognosis. CODE STATUS: DNR/DNI   Hospital course 77 year old M with PMH of metastatic pancreatic cancer on chemo, chronic abdominal pain s/p celiac plexus block 2 days prior to presentation, choledocholithiasis, urolithiasis, prostate cancer, osteoarthritis, GERD and HLD presented to ED with abdominal pain, multiple episodes of nausea and emesis, and admitted for intractable nausea, vomiting and abdominal pain.  CT abdomen and pelvis concerning for progression of metastatic pancreatic cancer since February with new liver mets, increased omental and retroperitoneal metastasis, and esophageal wall thickening suggesting esophagitis.    Given progression of cancer despite treatment, CODE STATUS changed to DNR/DNI.  Oncology recommended discharge home with home hospice.  TOC consulted.  Hospice referral done.    On the day of discharge, hospice acknowledged referral and ready for patient.  Pain controlled with home fentanyl patch and Dilaudid.  Narcotic database reviewed.  Renewed prescription for Dilaudid.  Also added gabapentin for possible neuropathic bilateral lower extremity pain.   See individual problem list below for more.   Problems addressed during this hospitalization Principal Problem:   Intractable nausea and vomiting Active Problems:   GERD (gastroesophageal reflux disease)   Generalized abdominal pain   Cancer of pancreas, body   Glaucoma   Anemia of chronic disease   Ileus   Hypokalemia   Malnutrition of moderate degree   Cancer related pain   DNR (do not resuscitate)   Bilateral lower extremity pain    Nutrition Problem: Moderate Malnutrition Etiology: chronic illness, cancer and cancer related  treatments Signs/Symptoms: mild fat depletion, mild muscle depletion, percent weight loss Interventions: Boost Breeze     Time spent 45 minutes  Vital signs Vitals:   02/03/23 0530 02/03/23 1350 02/03/23 2048 02/04/23 0510  BP: 119/84 102/71 104/75 117/87  Pulse: 75 79 69 78  Temp: 98.3 F (36.8 C) 98 F (36.7 C) 98 F (36.7 C) 98.1 F (36.7 C)  Resp: 18  18 18   Height:      Weight:      SpO2: 98% 98% 97% 97%  TempSrc: Oral Oral Oral Oral  BMI (Calculated):         Discharge exam  GENERAL: No apparent distress.  Nontoxic. HEENT: MMM.  Vision and hearing grossly intact.  NECK: Supple.  No apparent JVD.  RESP:  No IWOB.  Fair aeration bilaterally. CVS:  RRR. Heart sounds normal.  ABD/GI/GU: BS+. Abd soft, NTND.  MSK/EXT:  Moves extremities. No apparent deformity.  1+ BLE edema. SKIN: no apparent skin lesion or wound NEURO: Awake and alert. Oriented appropriately.  No apparent focal neuro deficit. PSYCH: Calm. Normal affect.   Discharge Instructions Discharge Instructions     Diet general   Complete by: As directed    Discharge instructions   Complete by: As directed    It has been a pleasure taking care of you!  You were hospitalized due to intractable nausea, vomiting and abdominal pain likely from cancer.  Your symptoms improved.  We are discharging you on medications for pain, nausea and vomiting.  Please review your new medication list and the directions on your medications before you take them.   Take care,   Increase activity slowly   Complete by: As directed       Allergies as of 02/04/2023  Reactions   Pseudoephedrine Other (See Comments)   Increased ocular pressure   Lupron [leuprolide] Itching, Other (See Comments)   Caused infection on hip         Medication List     STOP taking these medications    fluconazole 100 MG tablet Commonly known as: DIFLUCAN   potassium chloride SA 20 MEQ tablet Commonly known as: KLOR-CON M    tiZANidine 4 MG tablet Commonly known as: ZANAFLEX       TAKE these medications    acetaminophen 325 MG tablet Commonly known as: TYLENOL Take 325 mg by mouth daily as needed for headache.   Ayr 0.65 % nasal spray Generic drug: sodium chloride Place 2-3 sprays into the nose as needed for congestion.   brimonidine 0.15 % ophthalmic solution Commonly known as: ALPHAGAN Place 1 drop into the left eye in the morning and at bedtime.   cholecalciferol 25 MCG (1000 UNIT) tablet Commonly known as: VITAMIN D3 Take 1,000 Units by mouth daily.   diphenhydrAMINE 25 mg capsule Commonly known as: BENADRYL Take 1 capsule (25 mg total) by mouth every 6 (six) hours as needed (left leg erythema).   Dorzolamide HCl-Timolol Mal PF 2-0.5 % Soln Place 1 drop into the left eye 2 (two) times daily.   fentaNYL 100 MCG/HR Commonly known as: DURAGESIC Place 1 patch onto the skin every 3 (three) days.   gabapentin 300 MG capsule Commonly known as: NEURONTIN Take 1 capsule (300 mg total) by mouth 2 (two) times daily.   HYDROmorphone 4 MG tablet Commonly known as: Dilaudid Take 1-2 tablets (4-8 mg total) by mouth every 4 (four) hours as needed for up to 7 days for severe pain. Cancer pain What changed:  how much to take additional instructions   lidocaine-prilocaine cream Commonly known as: EMLA Apply 1 Application topically as needed. What changed: reasons to take this   MIRALAX PO Take 17 g by mouth daily as needed (for constipation- mix and drink).   ondansetron 8 MG tablet Commonly known as: ZOFRAN Take 1 tablet (8 mg total) by mouth every 8 (eight) hours as needed for nausea or vomiting (Starting day 3 after chemo as needed for nausea(received Aloxi with chemo)).   pantoprazole 40 MG tablet Commonly known as: PROTONIX Take 1 tablet (40 mg total) by mouth 2 (two) times daily.   predniSONE 10 MG tablet Commonly known as: DELTASONE Take 1 tablet (10 mg total) by mouth daily with  breakfast.   prochlorperazine 10 MG tablet Commonly known as: COMPAZINE Take 1 tablet (10 mg total) by mouth every 6 (six) hours as needed for nausea or vomiting.   Refresh Optive PF 0.5-0.9 % Soln Generic drug: Carboxymethylcell-Glycerin PF Place 1 drop into both eyes in the morning and at bedtime.   triptorelin 11.25 MG injection Commonly known as: TRELESTAR LA Inject 11.25 mg into the muscle as needed (PSA).   Tums Ultra 1000 400 MG chewable tablet Generic drug: calcium elemental as carbonate Chew 1,000 mg by mouth See admin instructions. Chew 1,000 mg by mouth with each dose of Vitamin D-3        Consultations: Oncology Palliative medicine  Procedures/Studies:   CT ABDOMEN PELVIS W CONTRAST  Result Date: 01/31/2023 CLINICAL DATA:  77 year old male with pancreatic cancer undergoing chemotherapy. Recent celiac axis CT-guided nerve block/ablation 01/28/2023. Acute pain. EXAM: CT ABDOMEN AND PELVIS WITH CONTRAST TECHNIQUE: Multidetector CT imaging of the abdomen and pelvis was performed using the standard protocol following bolus administration of  intravenous contrast. RADIATION DOSE REDUCTION: This exam was performed according to the departmental dose-optimization program which includes automated exposure control, adjustment of the mA and/or kV according to patient size and/or use of iterative reconstruction technique. CONTRAST:  OMNIPAQUE IOHEXOL 350 MG/ML SOLN COMPARISON:  CTA chest today reported separately. Restaging CT Chest, Abdomen, and Pelvis 12/12/2022. CT-guided images recent celiac plexus neurolytic ablation 01/28/2023. FINDINGS: Lower chest: Stable to the CTA chest reported separately today. Hepatobiliary: Numerous small hypodense liver metastases were not apparent in February. Gallbladder is chronically absent. Pancreas: Multifocal mixed enhancing and cystic/necrotic tumor of the pancreas (series 2, image 25) appears progressed. Associated pancreatic tail atrophy and  marked pancreatic ductal dilatation at the tail. Spleen: Negative. Adrenals/Urinary Tract: Adrenal glands remain within normal limits. There are progressed right pararenal space tumor implants/metastases (series 2, images 21 and 24) which are now up to 1.9 cm (1.1 cm previously). But renal enhancement and contrast excretion is maintained. No renal parenchymal metastasis. Nephrolithiasis on the right. Bladder appears to remain within normal limits. Stomach/Bowel: Multifocal heterogeneously enhancing, centrally necrotic omental metastases scattered in the abdomen and pelvis have enlarged since February, including the left lower quadrant peritoneal metastasis on series 2, image 61 (now 2.5 cm previously 1.9 cm) and similar perirectal implant on series 2, image 75 (now 1.9 cm previously 1.2 cm). But there is no evidence of associated mechanical bowel obstruction. There is fluid in the stomach and in various small bowel loops, and ileus is possible. But no differential dilatation of bowel. Right colon and transverse colon are decompressed. Large bowel inflammation in those segments is difficult to exclude. There is no pneumoperitoneum. No free fluid identified. Vascular/Lymphatic: Normal caliber abdominal aorta. Major arterial structures in the abdomen and pelvis remain patent. Aortoiliac calcified atherosclerosis. Portal venous system remains patent, although there is mass effect on the SMV confluence which is probably tumor related (series 2, image 26). Necrotic left retroperitoneal, pararenal lymphadenopathy has progressed since February now up to 4.2 cm on series 2, image 27, 2.6 cm previously. Reproductive: Nonspecific heterogeneity in the left inguinal canal is more conspicuous since February. No regional in quinolone lymphadenopathy. Other: No pelvis free fluid. Extensive bilateral pelvic sidewall surgical clips appear stable. Musculoskeletal: Stable visualized osseous structures. No acute or suspicious osseous  lesion is identified. IMPRESSION: 1. Progressed Primary and Metastatic Pancreatic Cancer since February. New liver metastases. Increasing omental, and retroperitoneal metastatic disease. Ileus is possible, but there is no evidence of mechanical bowel obstruction despite multifocal omental implants. 2. CTA Chest reported separately today. Electronically Signed   By: Odessa Fleming M.D.   On: 01/31/2023 06:15   CT Angio Chest PE W and/or Wo Contrast  Result Date: 01/31/2023 CLINICAL DATA:  77 year old male with pancreatic cancer undergoing chemotherapy. Recent celiac axis CT-guided nerve block/ablation 01/28/2023. Acute pain. EXAM: CT ANGIOGRAPHY CHEST WITH CONTRAST TECHNIQUE: Multidetector CT imaging of the chest was performed using the standard protocol during bolus administration of intravenous contrast. Multiplanar CT image reconstructions and MIPs were obtained to evaluate the vascular anatomy. RADIATION DOSE REDUCTION: This exam was performed according to the departmental dose-optimization program which includes automated exposure control, adjustment of the mA and/or kV according to patient size and/or use of iterative reconstruction technique. CONTRAST:  OMNIPAQUE IOHEXOL 350 MG/ML SOLN COMPARISON:  CT Abdomen and Pelvis today reported separately. Restaging CT Chest, Abdomen, and Pelvis 12/12/2022. FINDINGS: Cardiovascular: Good contrast bolus timing in the pulmonary arterial tree. No pulmonary artery filling defect. Heart size at the upper limits  of normal. No pericardial effusion. Negative thoracic aorta aside from mild tortuosity. Stable left chest Port-A-Cath. Mediastinum/Nodes: No mediastinal mass or lymphadenopathy. Nonspecific mild to moderate generalized esophageal wall thickening (series 4, image 28) is new or increased since February. Lungs/Pleura: Lower lung volumes compared to February. Mild respiratory motion. Central airways remain patent. Dependent and also platelike lower lobe atelectasis  greater on the left. No new or suspicious pulmonary nodule. No pleural effusion or convincing lung inflammation. Upper Abdomen: Reported separately today. Musculoskeletal: Stable visualized osseous structures. Dextroconvex thoracic scoliosis. Review of the MIP images confirms the above findings. IMPRESSION: 1. Negative for acute pulmonary embolus. Lower lung volumes with atelectasis. 2. Mild to moderate generalized esophageal wall thickening appears new or increased since February. Consider acute Esophagitis. 3. CT Abdomen and Pelvis today reported separately. Electronically Signed   By: Odessa Fleming M.D.   On: 01/31/2023 06:04   CT CELIAC PLEXUS BLOCK NEUROLYTIC  Result Date: 01/29/2023 CLINICAL DATA:  77 year old male with history of pancreatic cancer and intractable abdominal pain. Responded favorably to prior celiac nerve block on 11/29/2022. EXAM: CT GUIDED NEUROLYTIC ABLATION OF THE CELIAC AXIS ANESTHESIA/SEDATION: Moderate (conscious) sedation was employed during this procedure. A total of Versed 1 mg and Fentanyl 100 mcg was administered intravenously. Moderate Sedation Time: 20 minutes. The patient's level of consciousness and vital signs were monitored continuously by radiology nursing throughout the procedure under my direct supervision. PROCEDURE: The procedure risks, benefits, and alternatives were explained to the patient. Questions regarding the procedure were encouraged and answered. The patient understands and consents to the procedure. The anterior abdominal wall was prepped with Betadine in a sterile fashion, and a sterile drape was applied covering the operative field. A sterile gown and sterile gloves were used for the procedure. Local anesthesia was provided with 1% Lidocaine. A 22 gauge Chiba needle was advanced under CT guidance to the level of the celiac plexus. After confirming needle tip position, approximately three ml of a 1:10 dilution of Omnipaque-300 contrast and saline was injected.  Spread of diluted contrast material was confirmed by CT. A mixture of 3 ml of 0.5% Sensorcaine, 6 mL dehydrated ethanol, and 1 cc of dilute contrast material. This was injected through the needle. Completion CT demonstrated antecrural spread without evidence of complicating features. COMPLICATIONS: None FINDINGS: Contrast injection showed excellent spread across the midline at the level of the celiac plexus. Alcohol ablation was successfully performed. Alcohol injection was stopped when the patient did start to experience exacerbation of pain. IMPRESSION: CT guided neurolytic ablation of the celiac plexus as above. Alcohol ablation was performed with 6 ml of absolute alcohol. Marliss Coots, MD [facility] Electronically Signed   By: Marliss Coots M.D.   On: 01/29/2023 09:29   US Venous Img Lower Unilateral Left  Result Date: 01/07/2023 CLINICAL DATA:  leg swelling, access for blood clot EXAM: LEFT LOWER EXTREMITY VENOUS DOPPLER ULTRASOUND TECHNIQUE: Gray-scale sonography with compression, as well as color and duplex ultrasound, were performed to evaluate the deep venous system(s) from the level of the common femoral vein through the popliteal and proximal calf veins. COMPARISON:  None Available. FINDINGS: VENOUS Normal compressibility of the common femoral, superficial femoral, and popliteal veins, as well as the visualized calf veins. Visualized portions of profunda femoral vein and great saphenous vein unremarkable. No filling defects to suggest DVT on grayscale or color Doppler imaging. Doppler waveforms show normal direction of venous flow, normal respiratory plasticity and response to augmentation. Limited  views of the contralateral common femoral vein are unremarkable. OTHER Incidentally noted left inguinal lymph node measuring 2.5 by 1.1 cm. IMPRESSION: No evidence of DVT in the left lower extremity. Electronically Signed   By: Feliberto Harts  M.D.   On: 01/07/2023 19:02   DG Chest Port 1 View  Result Date: 01/07/2023 CLINICAL DATA:  Questionable sepsis EXAM: PORTABLE CHEST 1 VIEW COMPARISON:  Chest x-ray dated October 02, 2022 FINDINGS: Cardiac and mediastinal contours are unchanged. Left chest wall port is unchanged in position. Lungs are clear. No pleural effusion or pneumothorax. IMPRESSION: No active disease. Electronically Signed   By: Allegra Lai M.D.   On: 01/07/2023 18:41       The results of significant diagnostics from this hospitalization (including imaging, microbiology, ancillary and laboratory) are listed below for reference.     Microbiology: No results found for this or any previous visit (from the past 240 hour(s)).   Labs:  CBC: Recent Labs  Lab 01/30/23 0921 01/31/23 0401 02/01/23 1305 02/02/23 0415  WBC 7.0 8.9 11.4* 9.8  NEUTROABS 5.0 8.1*  --   --   HGB 11.4* 12.1* 12.6* 11.3*  HCT 34.7* 36.7* 40.4 34.6*  MCV 85.5 85.2 88.6 86.3  PLT 198 190 154 149*   BMP &GFR Recent Labs  Lab 01/30/23 0921 01/31/23 0401 02/01/23 1047 02/01/23 1305 02/02/23 0415  NA 138 137  --  139 139  K 3.8 3.1*  --  4.0 3.8  CL 103 101  --  108 108  CO2 29 25  --  21* 25  GLUCOSE 139* 144*  --  137* 118*  BUN 10 8  --  7* 7*  CREATININE 0.51* 0.48*  --  0.48* 0.39*  CALCIUM 8.8* 8.3*  --  8.0* 8.0*  MG  --  2.0 0.7* 2.1 2.2  PHOS  --  2.6  --  2.4* 1.8*   Estimated Creatinine Clearance: 84.9 mL/min (A) (by C-G formula based on SCr of 0.39 mg/dL (L)). Liver & Pancreas: Recent Labs  Lab 01/30/23 0921 01/31/23 0401 02/01/23 1305 02/02/23 0415  AST 19 23  --   --   ALT 15 17  --   --   ALKPHOS 79 75  --   --   BILITOT 0.5 1.3*  --   --   PROT 5.5* 5.6*  --   --   ALBUMIN 3.3* 3.0* 3.0* 2.7*   Recent Labs  Lab 01/31/23 0401  LIPASE 22   No results for input(s): "AMMONIA" in the last 168 hours. Diabetic: No results for input(s): "HGBA1C" in the last 72 hours. No results for input(s):  "GLUCAP" in the last 168 hours. Cardiac Enzymes: No results for input(s): "CKTOTAL", "CKMB", "CKMBINDEX", "TROPONINI" in the last 168 hours. No results for input(s): "PROBNP" in the last 8760 hours. Coagulation Profile: No results for input(s): "INR", "PROTIME" in the last 168 hours. Thyroid Function Tests: No results for input(s): "TSH", "T4TOTAL", "FREET4", "T3FREE", "THYROIDAB" in the last 72 hours. Lipid Profile: No results for input(s): "CHOL", "HDL", "LDLCALC", "TRIG", "CHOLHDL", "LDLDIRECT" in the last 72 hours. Anemia Panel: No results for input(s): "VITAMINB12", "FOLATE", "FERRITIN", "TIBC", "IRON", "RETICCTPCT" in the last 72 hours. Urine analysis:    Component Value Date/Time   COLORURINE YELLOW 01/31/2023 0719   APPEARANCEUR CLEAR 01/31/2023 0719   LABSPEC 1.041 (H) 01/31/2023 0719   PHURINE 7.0 01/31/2023 0719   GLUCOSEU NEGATIVE 01/31/2023 0719   HGBUR SMALL (A) 01/31/2023 0719   BILIRUBINUR NEGATIVE  01/31/2023 0719   KETONESUR 80 (A) 01/31/2023 0719   PROTEINUR NEGATIVE 01/31/2023 0719   UROBILINOGEN 1.0 07/13/2013 0342   NITRITE NEGATIVE 01/31/2023 0719   LEUKOCYTESUR NEGATIVE 01/31/2023 0719   Sepsis Labs: Invalid input(s): "PROCALCITONIN", "LACTICIDVEN"   SIGNED:  Almon Herculesaye T Rayssa Atha, MD  [facility] 02/04/2023, 11:54 AM

## 2023-02-04 NOTE — Progress Notes (Signed)
Civil engineer, contracting [facility])   Per TOC/Cyrus, patient will discharge today. There are no emergent DME needs per family at this time. Appropriate [facility] staff aware of patient DC & ACC is prepared for patient DC.  If applicable, please send signed and completed DNR with patient/family upon discharge. Please provide prescriptions at discharge as needed to ensure ongoing symptom management and a transport packet.   [facility] information and contact numbers given to family and above information shared with TOC.    Please call with any questions/concerns.    Thank you for the opportunity to participate in this patient's care   Eugenie Birks, MSW [facility] Liaison  [phone]

## 2023-02-04 NOTE — Progress Notes (Addendum)
Daily Progress Note   Patient Name: Jared [name]       Date: 02/04/2023 DOB: [birth year]  Age: 77 y.o. MRN#: [id number] Attending Physician: Almon Hercules, MD Primary Care Physician: Carney Living, MD Admit Date: 01/31/2023  Reason for Consultation/Follow-up: Establishing goals of care  Subjective: No distress, wife on phone.   Length of Stay: 4  Current Medications: Scheduled Meds:   brimonidine  1 drop Left Eye BID AC & HS   Carboxymethylcell-Glycerin PF  1 drop Ophthalmic BID   Chlorhexidine Gluconate Cloth  6 each Topical Daily   Dorzolamide HCl-Timolol Mal PF  1 drop Ophthalmic BID   enoxaparin (LOVENOX) injection  40 mg Subcutaneous Q24H   feeding supplement  1 Container Oral TID WC   fentaNYL  1 patch Transdermal Q72H   gabapentin  300 mg Oral BID   HYDROmorphone  8 mg Oral Q4H   pantoprazole (PROTONIX) IV  40 mg Intravenous Q24H   predniSONE  10 mg Oral Q breakfast    Continuous Infusions:   PRN Meds: acetaminophen **OR** acetaminophen, diphenhydrAMINE, hydrocortisone cream, HYDROmorphone (DILAUDID) injection, ondansetron (ZOFRAN) IV, phenol, prochlorperazine  Physical Exam         Awake alert Sitting in bed No distress Has erythema and edema LLE up to thigh, also with redness RLE as well.   Vital Signs: BP 117/87 (BP Location: Right Arm)   Pulse 78   Temp 98.1 F (36.7 C) (Oral)   Resp 18   Ht 6' (1.829 m)   Wt 91 kg   SpO2 97%   BMI 27.21 kg/m  SpO2: SpO2: 97 % O2 Device: O2 Device: Room Air O2 Flow Rate:    Intake/output summary: No intake or output data in the 24 hours ending 02/04/23 1057  LBM: Last BM Date : 02/02/23 Baseline Weight: Weight: 91 kg Most recent weight: Weight: 91 kg       Palliative Assessment/Data:      Patient  Active Problem List   Diagnosis Date Noted   Bilateral lower extremity pain 02/03/2023   DNR (do not resuscitate) 02/02/2023   Malnutrition of moderate degree 02/01/2023   Cancer related pain 02/01/2023   Intractable nausea and vomiting 01/31/2023   Ileus 01/31/2023   Hypokalemia 01/31/2023   Anemia of chronic disease 01/08/2023   Cellulitis of  left lower extremity 01/07/2023   Bilateral lower extremity edema 12/24/2022   Severe protein-calorie malnutrition 12/24/2022   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   Family history of pancreatic cancer 10/17/2022   Family history of prostate cancer 10/17/2022   Esophagitis 10/02/2022   Leukocytosis 10/02/2022   Hyperglycemia 10/02/2022   Aortic atherosclerosis 10/02/2022   Bilateral cataracts 10/02/2022   Glaucoma 10/02/2022   Genetic testing 09/06/2022   Choledocholithiasis 07/23/2022   Elevated CA 19-9 level 07/23/2022   H/O prostate cancer 07/23/2022   Cancer of pancreas, body 07/17/2022   Generalized abdominal pain 03/20/2022   Hyperlipidemia 09/06/2021   Abnormal white blood cell (WBC) count 09/16/2019   Hx of inguinal hernia surgery 03/12/2015   Kidney stone 06/24/2013   DJD (degenerative joint disease) 02/21/2011   OSTEOPENIA 03/24/2009   GERD (gastroesophageal reflux disease) 02/22/2009   PROSTATE CANCER 12/26/2006    Palliative Care Assessment & Plan   Patient Profile:    Assessment:  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 He began salvage treatment with gemcitabine/Abraxane in February. He has completed 3 treatments with gemcitabine/Abraxane. There is clinical, radiologic, and CA 19-9 evidence of disease progression. He is now admitted after an episode of nausea and vomiting. He does not appear to have a bowel obstruction.     Patient was seen by medical oncology Dr Truett Perna earlier today and in light of the patient's recent imaging showing progression, the patient's code status was revised to DNR  DNI and a TOC consult has been placed for home with hospice arrangements.   Recommendations/Plan:  Home with hospice Continue current opioids and pain management options.    Code Status:    Code Status Orders  (From admission, onward)           Start     Ordered   02/02/23 0939  Do not attempt resuscitation (DNR)  Continuous       Question Answer Comment  If patient has no pulse and is not breathing Do Not Attempt Resuscitation   If patient has a pulse and/or is breathing: Medical Treatment Goals COMFORT MEASURES: Keep clean/warm/dry, use medication by any route; positioning, wound care and other measures to relieve pain/suffering; use oxygen, suction/manual treatment of airway obstruction for comfort; do not transfer unless for comfort needs.   Consent: Discussion documented in EHR or advanced directives reviewed      02/02/23 0940           Code Status History     Date Active Date Inactive Code Status Order ID Comments User Context   01/31/2023 0956 02/02/2023 0940 Full Code [id number]  Bobette Mo, MD ED   01/28/2023 1238 01/29/2023 0512 Full Code [id number]  Suttle, Thressa Sheller, MD HOV   01/07/2023 2355 01/14/2023 0008 Full Code [id number]  Angie Fava, DO Inpatient   11/27/2022 1403 12/03/2022 2151 Full Code [id number]  Almond Lint, MD Inpatient   10/02/2022 0837 10/08/2022 2228 Full Code [id number]  Bobette Mo, MD ED   07/13/2013 1033 07/16/2013 2017 Full Code [id number]  Manus Rudd, MD Inpatient      Advance Directive Documentation    Flowsheet Row Most Recent Value  Type of Advance Directive Healthcare Power of Attorney, Living will  Pre-existing out of facility DNR order (yellow form or pink MOST form) --  "MOST" Form in Place? --       Prognosis:  < 6 months  Discharge Planning: Home with Hospice  Care plan was discussed with IDT  Thank you for allowing the Palliative Medicine Team to assist in the care of this patient.  low MDM    Greater  than 50%  of this time was spent counseling and coordinating care related to the above assessment and plan.  Rosalin HawkingZeba Daijah Scrivens, MD  Please contact Palliative Medicine Team phone at [phone] for questions and concerns.

## 2023-02-04 NOTE — TOC Transition Note (Signed)
Transition of Care [facility]) - CM/SW Discharge Note   Patient Details  Name: Jared [name] MRN: [id number] Date of Birth: October 12, 1946  [facility] CM/SW Contact:  Erin Sons, LCSW Phone Number: 02/04/2023, 10:24 AM   Clinical Narrative:     CSW notified [facility] liaison of pt's DC today. They are ready for pt to DC home. DNR form on pt's chart. Awaiting family to arrive for transport home.   Final next level of care: Home w Hospice Care Barriers to Discharge: No Barriers Identified   Patient Goals and CMS Choice      Discharge Placement                         Discharge Plan and Services Additional resources added to the After Visit Summary for                                       Social Determinants of Health (SDOH) Interventions SDOH Screenings   Food Insecurity: No Food Insecurity (01/31/2023)  Housing: Low Risk  (01/31/2023)  Transportation Needs: No Transportation Needs (01/31/2023)  Utilities: Not At Risk (01/31/2023)  Depression (PHQ2-9): Low Risk  (10/16/2022)  Financial Resource Strain: Low Risk  (07/26/2022)  Social Connections: Socially Integrated (07/26/2022)  Tobacco Use: Medium Risk (01/31/2023)     Readmission Risk Interventions    02/03/2023   12:18 PM 02/01/2023    2:28 PM 01/10/2023   10:22 AM  Readmission Risk Prevention Plan  Transportation Screening Complete Complete Complete  Medication Review Oceanographer) Not Complete Complete Complete  Med Review Comments RNCM will review    PCP or Specialist appointment within 3-5 days of discharge Not Complete Complete Complete  HRI or Home Care Consult Complete Complete Complete  SW Recovery Care/Counseling Consult Complete Complete Complete  Palliative Care Screening Complete Not Applicable Not Applicable  Skilled Nursing Facility Not Applicable Not Applicable Not Applicable

## 2023-02-04 NOTE — Care Management Important Message (Signed)
Important Message  Patient Details IM Letter given. Name: Jared [name] MRN: [id number] Date of Birth: 07/11/1946   Medicare Important Message Given:  Yes     Caren Macadam 02/04/2023, 9:06 AM

## 2023-02-06 ENCOUNTER — Encounter: Payer: Self-pay | Admitting: Family Medicine

## 2023-02-06 ENCOUNTER — Emergency Department (HOSPITAL_COMMUNITY)
Admission: EM | Admit: 2023-02-06 | Discharge: 2023-02-06 | Disposition: A | Discharge status: Home / Self Care | Attending: Emergency Medicine | Admitting: Emergency Medicine

## 2023-02-06 ENCOUNTER — Emergency Department (HOSPITAL_COMMUNITY)

## 2023-02-06 ENCOUNTER — Encounter (HOSPITAL_COMMUNITY): Payer: Self-pay

## 2023-02-06 ENCOUNTER — Other Ambulatory Visit: Payer: Self-pay

## 2023-02-06 DIAGNOSIS — D696 Thrombocytopenia, unspecified: Secondary | ICD-10-CM | POA: Diagnosis not present

## 2023-02-06 DIAGNOSIS — N189 Chronic kidney disease, unspecified: Secondary | ICD-10-CM | POA: Diagnosis not present

## 2023-02-06 DIAGNOSIS — E871 Hypo-osmolality and hyponatremia: Secondary | ICD-10-CM | POA: Insufficient documentation

## 2023-02-06 DIAGNOSIS — R109 Unspecified abdominal pain: Secondary | ICD-10-CM | POA: Diagnosis not present

## 2023-02-06 DIAGNOSIS — D649 Anemia, unspecified: Secondary | ICD-10-CM | POA: Diagnosis not present

## 2023-02-06 DIAGNOSIS — Z8507 Personal history of malignant neoplasm of pancreas: Secondary | ICD-10-CM | POA: Diagnosis not present

## 2023-02-06 DIAGNOSIS — R197 Diarrhea, unspecified: Secondary | ICD-10-CM | POA: Insufficient documentation

## 2023-02-06 DIAGNOSIS — R7989 Other specified abnormal findings of blood chemistry: Secondary | ICD-10-CM

## 2023-02-06 DIAGNOSIS — G8929 Other chronic pain: Secondary | ICD-10-CM | POA: Insufficient documentation

## 2023-02-06 DIAGNOSIS — R112 Nausea with vomiting, unspecified: Secondary | ICD-10-CM | POA: Diagnosis present

## 2023-02-06 LAB — COMPREHENSIVE METABOLIC PANEL
ALT: 72 U/L — ABNORMAL HIGH (ref 0–44)
AST: 95 U/L — ABNORMAL HIGH (ref 15–41)
Albumin: 2.9 g/dL — ABNORMAL LOW (ref 3.5–5.0)
Alkaline Phosphatase: 103 U/L (ref 38–126)
Anion gap: 12 (ref 5–15)
BUN: 8 mg/dL (ref 8–23)
CO2: 23 mmol/L (ref 22–32)
Calcium: 8.3 mg/dL — ABNORMAL LOW (ref 8.9–10.3)
Chloride: 99 mmol/L (ref 98–111)
Creatinine, Ser: 0.47 mg/dL — ABNORMAL LOW (ref 0.61–1.24)
GFR, Estimated: >60 mL/min (ref >60)
Glucose, Bld: 138 mg/dL — ABNORMAL HIGH (ref 70–99)
Potassium: 3.1 mmol/L — ABNORMAL LOW (ref 3.5–5.1)
Sodium: 134 mmol/L — ABNORMAL LOW (ref 135–145)
Total Bilirubin: 1.3 mg/dL — ABNORMAL HIGH (ref 0.3–1.2)
Total Protein: 5.8 g/dL — ABNORMAL LOW (ref 6.5–8.1)

## 2023-02-06 LAB — CBC WITH DIFFERENTIAL/PLATELET
Abs Immature Granulocytes: 0.06 10*3/uL (ref 0.00–0.07)
Basophils Absolute: 0 10*3/uL (ref 0.0–0.1)
Basophils Relative: 0 %
Eosinophils Absolute: 0 10*3/uL (ref 0.0–0.5)
Eosinophils Relative: 0 %
HCT: 35 % — ABNORMAL LOW (ref 39.0–52.0)
Hemoglobin: 11.7 g/dL — ABNORMAL LOW (ref 13.0–17.0)
Immature Granulocytes: 1 %
Lymphocytes Relative: 9 %
Lymphs Abs: 0.5 10*3/uL — ABNORMAL LOW (ref 0.7–4.0)
MCH: 28.2 pg (ref 26.0–34.0)
MCHC: 33.4 g/dL (ref 30.0–36.0)
MCV: 84.3 fL (ref 80.0–100.0)
Monocytes Absolute: 0.4 10*3/uL (ref 0.1–1.0)
Monocytes Relative: 7 %
Neutro Abs: 5.1 10*3/uL (ref 1.7–7.7)
Neutrophils Relative %: 83 %
Platelets: 137 10*3/uL — ABNORMAL LOW (ref 150–400)
RBC: 4.15 MIL/uL — ABNORMAL LOW (ref 4.22–5.81)
RDW: 15.6 % — ABNORMAL HIGH (ref 11.5–15.5)
WBC: 6.1 10*3/uL (ref 4.0–10.5)
nRBC: 0 % (ref 0.0–0.2)

## 2023-02-06 LAB — LIPASE, BLOOD: Lipase: 20 U/L (ref 11–51)

## 2023-02-06 MED ORDER — HYDROMORPHONE HCL 1 MG/ML IJ SOLN
1.0000 mg | Freq: Once | INTRAMUSCULAR | Status: AC
  Administered 2023-02-06: 1 mg via INTRAVENOUS
  Filled 2023-02-06: qty 1

## 2023-02-06 MED ORDER — PROCHLORPERAZINE 25 MG RE SUPP: 25.0000 mg | Freq: Two times a day (BID) | RECTAL | 0 refills | Status: DC | PRN

## 2023-02-06 MED ORDER — ONDANSETRON 8 MG PO TBDP: 8.0000 mg | ORAL_TABLET | Freq: Three times a day (TID) | ORAL | 0 refills | Status: DC | PRN

## 2023-02-06 MED ORDER — ONDANSETRON HCL 4 MG/2ML IJ SOLN
4.0000 mg | Freq: Once | INTRAMUSCULAR | Status: AC
  Administered 2023-02-06: 4 mg via INTRAVENOUS
  Filled 2023-02-06: qty 2

## 2023-02-06 MED ORDER — PROCHLORPERAZINE EDISYLATE 10 MG/2ML IJ SOLN
10.0000 mg | Freq: Once | INTRAMUSCULAR | Status: AC
  Administered 2023-02-06: 10 mg via INTRAMUSCULAR
  Filled 2023-02-06: qty 2

## 2023-02-06 MED ORDER — IOHEXOL 300 MG/ML  SOLN
100.0000 mL | Freq: Once | INTRAMUSCULAR | Status: AC | PRN
  Administered 2023-02-06: 100 mL via INTRAVENOUS

## 2023-02-06 MED ORDER — HEPARIN SOD (PORK) LOCK FLUSH 100 UNIT/ML IV SOLN
500.0000 [IU] | Freq: Once | INTRAVENOUS | Status: AC
  Administered 2023-02-06: 500 [IU]
  Filled 2023-02-06: qty 5

## 2023-02-06 MED ORDER — SODIUM CHLORIDE 0.9 % IV BOLUS
1000.0000 mL | Freq: Once | INTRAVENOUS | Status: AC
  Administered 2023-02-06: 1000 mL via INTRAVENOUS

## 2023-02-06 MED ORDER — LOPERAMIDE HCL 2 MG PO CAPS
4.0000 mg | ORAL_CAPSULE | Freq: Once | ORAL | Status: AC
  Administered 2023-02-06: 4 mg via ORAL
  Filled 2023-02-06: qty 2

## 2023-02-06 MED ORDER — POTASSIUM CHLORIDE CRYS ER 20 MEQ PO TBCR: 20.0000 meq | EXTENDED_RELEASE_TABLET | Freq: Every day | ORAL | 0 refills | Status: DC

## 2023-02-06 MED ORDER — PANTOPRAZOLE SODIUM 40 MG IV SOLR
40.0000 mg | Freq: Once | INTRAVENOUS | Status: AC
  Administered 2023-02-06: 40 mg via INTRAVENOUS
  Filled 2023-02-06: qty 10

## 2023-02-06 MED ORDER — SODIUM CHLORIDE (PF) 0.9 % IJ SOLN
INTRAMUSCULAR | Status: AC
  Filled 2023-02-06: qty 50

## 2023-02-06 NOTE — ED Notes (Addendum)
PT request pain med before PO challenge. Water at bedside

## 2023-02-06 NOTE — ED Provider Notes (Signed)
[place] EMERGENCY DEPARTMENT AT [facility] Provider Note   CSN: [id number] Arrival date & time: 02/06/23  5003     History  No chief complaint on file.   Jared [name] is a 77 y.o. male.  The history is provided by the patient.  He has history of metastatic pancreatic cancer, GERD, chronic kidney disease recently discharged from the hospital with intractable nausea and vomiting.  He was placed on hospice at that time and discharged with prescriptions for ondansetron and prochlorperazine for nausea.  He states that he started having vomiting and diarrhea yesterday and has not been able to hold anything down through the day.  He has vomited when he has tried to take the ondansetron and prochlorperazine.  He has not taken anything for diarrhea.  He denies fever or chills.  He does endorse mid abdominal pain with radiation to the back.  He has been unable to hold his narcotic pain medication down because of vomiting.  He denies fever, chills, sweats.   Home Medications Prior to Admission medications   Medication Sig Start Date End Date Taking? Authorizing Provider  acetaminophen (TYLENOL) 325 MG tablet Take 325 mg by mouth daily as needed for headache.    [provider]  brimonidine (ALPHAGAN) 0.15 % ophthalmic solution Place 1 drop into the left eye in the morning and at bedtime.    [provider]  Carboxymethylcell-Glycerin PF (REFRESH OPTIVE PF) 0.5-0.9 % SOLN Place 1 drop into both eyes in the morning and at bedtime. 04/28/18   [provider]  cholecalciferol (VITAMIN D3) 25 MCG (1000 UNIT) tablet Take 1,000 Units by mouth daily.    [provider]  diphenhydrAMINE (BENADRYL) 25 mg capsule Take 1 capsule (25 mg total) by mouth every 6 (six) hours as needed (left leg erythema). 01/11/23   Rhetta Mura, MD  Dorzolamide HCl-Timolol Mal PF 2-0.5 % SOLN Place 1 drop into the left eye 2 (two) times daily. 12/01/21   [provider]   fentaNYL (DURAGESIC) 100 MCG/HR Place 1 patch onto the skin every 3 (three) days. 01/29/23   Pickenpack-Cousar, Arty Baumgartner, NP  gabapentin (NEURONTIN) 300 MG capsule Take 1 capsule (300 mg total) by mouth 2 (two) times daily. 02/04/23 05/05/23  Almon Hercules, MD  HYDROmorphone (DILAUDID) 4 MG tablet Take 1-2 tablets (4-8 mg total) by mouth every 4 (four) hours as needed for up to 7 days for severe pain. Cancer pain 02/04/23 02/11/23  Almon Hercules, MD  lidocaine-prilocaine (EMLA) cream Apply 1 Application topically as needed. Patient taking differently: Apply 1 Application topically as needed (for port access). 12/11/22   Ladene Artist, MD  ondansetron (ZOFRAN) 8 MG tablet Take 1 tablet (8 mg total) by mouth every 8 (eight) hours as needed for nausea or vomiting (Starting day 3 after chemo as needed for nausea(received Aloxi with chemo)). 09/10/22   Ladene Artist, MD  pantoprazole (PROTONIX) 40 MG tablet Take 1 tablet (40 mg total) by mouth 2 (two) times daily. 01/29/23   Pickenpack-Cousar, Arty Baumgartner, NP  Polyethylene Glycol 3350 (MIRALAX PO) Take 17 g by mouth daily as needed (for constipation- mix and drink).    [provider]  predniSONE (DELTASONE) 10 MG tablet Take 1 tablet (10 mg total) by mouth daily with breakfast. 01/17/23   Ladene Artist, MD  prochlorperazine (COMPAZINE) 10 MG tablet Take 1 tablet (10 mg total) by mouth every 6 (six) hours as needed for nausea or vomiting. 10/07/22  Rodolph Bong, MD  sodium chloride (AYR) 0.65 % nasal spray Place 2-3 sprays into the nose as needed for congestion.    [provider]  Triptorelin Pamoate 11.25 MG SUSR Inject 11.25 mg into the muscle as needed (PSA).    [provider]  TUMS ULTRA 1000 1000 MG chewable tablet Chew 1,000 mg by mouth See admin instructions. Chew 1,000 mg by mouth with each dose of Vitamin D-3    [provider]      Allergies    Pseudoephedrine and Lupron [leuprolide]    Review of Systems    Review of Systems  All other systems reviewed and are negative.   Physical Exam Updated Vital Signs Ht 6' (1.829 m)   Wt 91 kg   BMI 27.21 kg/m  Physical Exam Vitals and nursing note reviewed.   77 year old male, resting comfortably and in no acute distress. Vital signs are normal. Oxygen saturation is 100%, which is normal. Head is normocephalic and atraumatic. PERRLA, EOMI. Oropharynx is clear. Neck is nontender and supple without adenopathy or JVD. Back is nontender and there is no CVA tenderness. Lungs are clear without rales, wheezes, or rhonchi. Chest is nontender. Heart has regular rate and rhythm without murmur. Abdomen is soft, flat, with moderate mid abdominal tenderness.  There is no rebound or guarding. Extremities have no cyanosis or edema, full range of motion is present. Skin is warm and dry without rash. Neurologic: Mental status is normal, cranial nerves are intact, moves all extremities equally.  ED Results / Procedures / Treatments   Labs (all labs ordered are listed, but only abnormal results are displayed) Labs Reviewed  COMPREHENSIVE METABOLIC PANEL - Abnormal; Notable for the following components:      Result Value   Sodium 134 (*)    Potassium 3.1 (*)    Glucose, Bld 138 (*)    Creatinine, Ser 0.47 (*)    Calcium 8.3 (*)    Total Protein 5.8 (*)    Albumin 2.9 (*)    AST 95 (*)    ALT 72 (*)    Total Bilirubin 1.3 (*)    All other components within normal limits  CBC WITH DIFFERENTIAL/PLATELET - Abnormal; Notable for the following components:   RBC 4.15 (*)    Hemoglobin 11.7 (*)    HCT 35.0 (*)    RDW 15.6 (*)    Platelets 137 (*)    Lymphs Abs 0.5 (*)    All other components within normal limits  LIPASE, BLOOD   Radiology CT ABDOMEN PELVIS W CONTRAST  Result Date: 02/06/2023 CLINICAL DATA:  Bowel obstruction suspected. EXAM: CT ABDOMEN AND PELVIS WITH CONTRAST TECHNIQUE: Multidetector CT imaging of the abdomen and pelvis was  performed using the standard protocol following bolus administration of intravenous contrast. RADIATION DOSE REDUCTION: This exam was performed according to the departmental dose-optimization program which includes automated exposure control, adjustment of the mA and/or kV according to patient size and/or use of iterative reconstruction technique. CONTRAST:  OMNIPAQUE IOHEXOL 300 MG/ML  SOLN COMPARISON:  Six days ago FINDINGS: Lower chest: No acute finding. 3 mm pulmonary nodule in the right lower lobe, presumably metastatic in this setting. Hepatobiliary: Numerous hepatic metastases which are small with ill-defined low-density appearance.Cholecystectomy. No biliary dilatation Pancreas: Pancreatic body mass extending beyond the ventral serosa with hypointense appearance. There is dilatation of the pancreatic duct at the tail due to obstruction. No change in the primary mass which is in close proximity  to the posterior wall of the stomach. Spleen: Unremarkable. 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 Nonobstructing multiple right renal calculi. Unremarkable bladder. Stomach/Bowel: Negative for bowel obstruction or acute inflammatory changes. Vascular/Lymphatic: Atheromatous plaque. 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 Retroperitoneal and omental based nodules from metastatic disease. Omental based nodule in the pelvis along the sigmoid colon measures 3.5 cm. Pelvic lymphadenectomy. Reproductive:Prostatectomy. Other: No ascites or pneumoperitoneum. Musculoskeletal: No acute abnormalities. Osteopenia and generalized degenerative change. IMPRESSION: 1. Negative for bowel obstruction. 2. Widespread metastatic pancreas cancer without change from recent CT. Electronically Signed   By: Tiburcio PeaJonathan  Watts M.D.   On: 02/06/2023 04:31    Procedures Procedures  Cardiac monitor shows normal sinus rhythm, per my interpretation.  Medications Ordered in  ED Medications  sodium chloride 0.9 % bolus 1,000 mL (0 mLs Intravenous Stopped 02/06/23 0417)  HYDROmorphone (DILAUDID) injection 1 mg (1 mg Intravenous Given 02/06/23 0304)  ondansetron (ZOFRAN) injection 4 mg (4 mg Intravenous Given 02/06/23 0307)  iohexol (OMNIPAQUE) 300 MG/ML solution 100 mL (100 mLs Intravenous Contrast Given 02/06/23 0405)  HYDROmorphone (DILAUDID) injection 1 mg (1 mg Intravenous Given 02/06/23 0429)  HYDROmorphone (DILAUDID) injection 1 mg (1 mg Intravenous Given 02/06/23 0512)  loperamide (IMODIUM) capsule 4 mg (4 mg Oral Given 02/06/23 0512)  pantoprazole (PROTONIX) injection 40 mg (40 mg Intravenous Given 02/06/23 0522)    ED Course/ Medical Decision Making/ A&P                             Medical Decision Making Amount and/or Complexity of Data Reviewed Labs: ordered. Radiology: ordered.  Risk Prescription drug management.   Recurrent nausea and vomiting, now with diarrhea in patient with known metastatic pancreatic cancer.  It is certainly possible that he just needs an alternate means of getting medication in his system as he has vomited after taking the antiemetics.  I have ordered IV fluids, IV ondansetron, IV hydromorphone and I have ordered laboratory workup of CBC, comprehensive metabolic panel, lipase.  Also, I have ordered CT of abdomen and pelvis to rule out bowel obstruction.  I reviewed his past records, and he was admitted to the hospital on 01/31/2023 for intractable nausea and vomiting and discharged on 02/04/2023 with prescriptions for ondansetron and prochlorperazine tablets for nausea.  Following above-noted treatment, he had good control of nausea although he did require several additional doses of hydromorphone for pain control.  I have reviewed and interpreted his laboratory tests, and my interpretation is mild hyponatremia which is not felt to be clinically significant, mild to moderate hypokalemia and he has been given a dose of oral potassium,  elevated transaminases and bilirubin consistent with his known diagnosis of metastatic pancreatic cancer, mild anemia and thrombocytopenia which are not significantly changed from prior.  CT scan shows no acute process, not significantly changed from recent CT scan.  I have independently viewed the images, and agree with radiologist's interpretation.  He was given an oral fluid challenge and successfully held fluids down.  I feel he is safe for discharge, no indication for hospitalization for IV fluids.  My main concern is that he needs to have antiemetics and a form that he can take even if he is vomiting.  Therefore, I am discharging him with prescriptions for ondansetron oral dissolving tablet and prochlorperazine suppository.  He is to use these if he is actively vomiting, can use the regular tablets if he is only  having nausea without vomiting.  He will need to continue to work with his providers regarding analgesia.  Return precautions discussed.  Final Clinical Impression(s) / ED Diagnoses Final diagnoses:  Non-intractable vomiting with nausea  Diarrhea, unspecified type  Chronic abdominal pain  Hyponatremia  Elevated liver function tests  Normochromic normocytic anemia  Thrombocytopenia    Rx / DC Orders ED Discharge Orders          Ordered    ondansetron (ZOFRAN-ODT) 8 MG disintegrating tablet  Every 8 hours PRN        02/06/23 0557    prochlorperazine (COMPAZINE) 25 MG suppository  Every 12 hours PRN        02/06/23 0557    potassium chloride SA (KLOR-CON M) 20 MEQ tablet  Daily        02/06/23 0559              Dione Booze, MD 02/06/23 [phone]

## 2023-02-06 NOTE — Discharge Instructions (Signed)
If you are vomiting, use the ondansetron oral dissolving tablet or the prochlorperazine suppository.  If you are only having nausea, you may use the regular ondansetron tablet or the prochlorperazine tablet.  For diarrhea, you may take loperamide (Imodium A-D) as needed.  Continue working with your medical care providers to adjust your dose of pain medication so that you are staying comfortable.  Return if symptoms or not being adequately controlled at home.

## 2023-02-06 NOTE — ED Triage Notes (Signed)
BIBA from home for n/v/d since last chemo txt and has gotten worse, hospice pt just discharged yesterday from hospital.

## 2023-02-10 ENCOUNTER — Other Ambulatory Visit: Payer: Self-pay | Admitting: Oncology

## 2023-02-14 ENCOUNTER — Inpatient Hospital Stay: Payer: Medicare HMO

## 2023-02-14 ENCOUNTER — Inpatient Hospital Stay: Payer: Medicare HMO | Admitting: Nurse Practitioner

## 2023-02-14 ENCOUNTER — Telehealth: Payer: Self-pay

## 2023-02-14 NOTE — Telephone Encounter (Signed)
The patient was admitted to [facility] on February 13, 2023. I informed the patient's spouse that I had notified the healthcare provider.

## 2023-02-27 DEATH — deceased

## 2023-02-28 ENCOUNTER — Inpatient Hospital Stay: Payer: Medicare HMO | Admitting: Nurse Practitioner

## 2023-02-28 ENCOUNTER — Inpatient Hospital Stay: Payer: Medicare HMO
